# Patient Record
Sex: Male | Born: 1945 | Race: White | Hispanic: No | Marital: Married
Health system: Midwestern US, Academic
[De-identification: ages and names within clinical notes are randomized; demographics above are authoritative.]

---

## 2017-03-03 ENCOUNTER — Encounter: Admit: 2017-03-03 | Discharge: 2017-03-03 | Payer: MEDICARE

## 2017-03-03 MED ORDER — KLOR-CON M20 20 MEQ PO TBTQ
ORAL_TABLET | Freq: Two times a day (BID) | ORAL | 3 refills | 30.00000 days | Status: AC
Start: 2017-03-03 — End: 2017-12-11

## 2017-03-13 ENCOUNTER — Encounter: Admit: 2017-03-13 | Discharge: 2017-03-13 | Payer: MEDICARE

## 2017-03-13 DIAGNOSIS — I480 Paroxysmal atrial fibrillation: Principal | ICD-10-CM

## 2017-04-04 ENCOUNTER — Encounter: Admit: 2017-04-04 | Discharge: 2017-04-04 | Payer: MEDICARE

## 2017-04-04 NOTE — Telephone Encounter
Left msg at preferred # re: overdue INR. Asked pt to contact us if pt has had INR drawn recently. Advised if pt has not had INR since last check (June) pt should to get to lab within 1 week for INR. Left cardiology main # for call back prn.    ----- Message -----  From: Asencion Noble  Sent: 03/27/2017  To: [facility] [place]  Subject: INR Due 03/27/17                                   Johnny [name] is due for an INR test on 03/27/17.

## 2017-04-17 NOTE — Telephone Encounter
Called and left message requesting pt to have INR drawn at earliest convenience.  Left callback number for any questions or concerns.

## 2017-04-28 ENCOUNTER — Encounter: Admit: 2017-04-28 | Discharge: 2017-04-28 | Payer: MEDICARE

## 2017-05-03 ENCOUNTER — Encounter: Admit: 2017-05-03 | Discharge: 2017-05-03 | Payer: MEDICARE

## 2017-05-03 DIAGNOSIS — I480 Paroxysmal atrial fibrillation: Principal | ICD-10-CM

## 2017-05-03 DIAGNOSIS — Z7901 Long term (current) use of anticoagulants: ICD-10-CM

## 2017-05-03 NOTE — Progress Notes
Faxed standing INR order to [facility] [phone]

## 2017-05-16 ENCOUNTER — Encounter: Admit: 2017-05-16 | Discharge: 2017-05-16 | Payer: MEDICARE

## 2017-06-07 ENCOUNTER — Encounter: Admit: 2017-06-07 | Discharge: 2017-06-07 | Payer: MEDICARE

## 2017-06-07 NOTE — Telephone Encounter
INR overdue. Called and left message requesting pt to have INR drawn at earliest convenience.  Left callback number for any questions or concerns.

## 2017-06-15 ENCOUNTER — Ambulatory Visit: Admit: 2017-06-15 | Discharge: 2017-06-16 | Payer: MEDICARE

## 2017-06-15 DIAGNOSIS — I255 Ischemic cardiomyopathy: Principal | ICD-10-CM

## 2017-06-15 DIAGNOSIS — Z95810 Presence of automatic (implantable) cardiac defibrillator: ICD-10-CM

## 2017-06-16 ENCOUNTER — Encounter: Admit: 2017-06-16 | Discharge: 2017-06-16 | Payer: MEDICARE

## 2017-06-16 NOTE — Telephone Encounter
INR Overdue.  Called and discussed with patient who states that he will have it drawn next week.

## 2017-07-10 ENCOUNTER — Encounter: Admit: 2017-07-10 | Discharge: 2017-07-10 | Payer: MEDICARE

## 2017-07-10 DIAGNOSIS — E7800 Pure hypercholesterolemia, unspecified: Principal | ICD-10-CM

## 2017-07-10 NOTE — Telephone Encounter
-----   Message from Alfredia Client, LPN sent at 6/56/8127 11:55 AM CST -----  Regarding: Lab order  Please send order to patient to have FLP drawn per Dr. Hale Bogus. Thank you!

## 2017-07-14 ENCOUNTER — Encounter: Admit: 2017-07-14 | Discharge: 2017-07-14 | Payer: MEDICARE

## 2017-07-14 DIAGNOSIS — I480 Paroxysmal atrial fibrillation: Principal | ICD-10-CM

## 2017-07-20 ENCOUNTER — Ambulatory Visit: Admit: 2017-07-19 | Discharge: 2017-07-20 | Payer: MEDICARE

## 2017-07-20 DIAGNOSIS — I255 Ischemic cardiomyopathy: Principal | ICD-10-CM

## 2017-07-20 DIAGNOSIS — Z95810 Presence of automatic (implantable) cardiac defibrillator: ICD-10-CM

## 2017-07-21 ENCOUNTER — Encounter: Admit: 2017-07-21 | Discharge: 2017-07-21 | Payer: MEDICARE

## 2017-07-21 NOTE — Telephone Encounter
INR Overdue.  Called and discussed overdue INR with patient.  He states he will have INR checked next week.

## 2017-07-22 ENCOUNTER — Encounter: Admit: 2017-07-22 | Discharge: 2017-07-22 | Payer: MEDICARE

## 2017-07-24 ENCOUNTER — Encounter: Admit: 2017-07-24 | Discharge: 2017-07-24 | Payer: MEDICARE

## 2017-07-24 DIAGNOSIS — Z95810 Presence of automatic (implantable) cardiac defibrillator: Principal | ICD-10-CM

## 2017-07-24 MED ORDER — IRBESARTAN 150 MG PO TAB
ORAL_TABLET | Freq: Every day | 3 refills | Status: AC
Start: 2017-07-24 — End: 2017-10-17

## 2017-08-04 NOTE — Telephone Encounter
INR Overdue.  Called and left message requesting pt to have INR drawn at earliest convenience.  Left callback number for any questions or concerns.

## 2017-08-05 ENCOUNTER — Encounter: Admit: 2017-08-05 | Discharge: 2017-08-05 | Payer: MEDICARE

## 2017-08-05 DIAGNOSIS — I10 Essential (primary) hypertension: Principal | ICD-10-CM

## 2017-08-05 DIAGNOSIS — I679 Cerebrovascular disease, unspecified: ICD-10-CM

## 2017-08-07 MED ORDER — WARFARIN 4 MG PO TAB
ORAL_TABLET | Freq: Every day | ORAL | 3 refills | 90.00000 days | Status: AC
Start: 2017-08-07 — End: 2017-10-17

## 2017-08-14 ENCOUNTER — Encounter: Admit: 2017-08-14 | Discharge: 2017-08-14 | Payer: MEDICARE

## 2017-08-14 DIAGNOSIS — I480 Paroxysmal atrial fibrillation: Principal | ICD-10-CM

## 2017-08-22 ENCOUNTER — Encounter: Admit: 2017-08-22 | Discharge: 2017-08-22 | Payer: MEDICARE

## 2017-08-22 DIAGNOSIS — I480 Paroxysmal atrial fibrillation: Principal | ICD-10-CM

## 2017-09-02 ENCOUNTER — Encounter: Admit: 2017-09-02 | Discharge: 2017-09-02 | Payer: MEDICARE

## 2017-09-04 MED ORDER — WARFARIN 5 MG PO TAB
ORAL_TABLET | Freq: Every day | ORAL | 3 refills | 90.00000 days | Status: AC
Start: 2017-09-04 — End: 2017-10-17

## 2017-09-04 MED ORDER — EZETIMIBE 10 MG PO TAB
ORAL_TABLET | Freq: Every day | 3 refills | Status: AC
Start: 2017-09-04 — End: 2018-02-20

## 2017-09-13 ENCOUNTER — Encounter: Admit: 2017-09-13 | Discharge: 2017-09-13 | Payer: MEDICARE

## 2017-09-21 ENCOUNTER — Encounter: Admit: 2017-09-21 | Discharge: 2017-09-21 | Payer: MEDICARE

## 2017-09-21 DIAGNOSIS — E7800 Pure hypercholesterolemia, unspecified: Principal | ICD-10-CM

## 2017-09-21 DIAGNOSIS — I480 Paroxysmal atrial fibrillation: Principal | ICD-10-CM

## 2017-09-21 LAB — COMPREHENSIVE METABOLIC PANEL
Lab: 1
Lab: 15 — ABNORMAL HIGH (ref 0–14)
Lab: 20
Lab: 28
Lab: 3.5
Lab: 6.2
Lab: 60

## 2017-09-21 LAB — LIPID PROFILE
Lab: 24
Lab: 110 — ABNORMAL LOW (ref 150–200)
Lab: 120
Lab: 4

## 2017-09-26 ENCOUNTER — Encounter: Admit: 2017-09-26 | Discharge: 2017-09-26 | Payer: MEDICARE

## 2017-09-26 ENCOUNTER — Ambulatory Visit: Admit: 2017-09-26 | Discharge: 2017-09-27 | Payer: MEDICARE

## 2017-09-26 DIAGNOSIS — Z7189 Other specified counseling: ICD-10-CM

## 2017-09-26 DIAGNOSIS — I340 Nonrheumatic mitral (valve) insufficiency: ICD-10-CM

## 2017-09-26 DIAGNOSIS — J302 Other seasonal allergic rhinitis: ICD-10-CM

## 2017-09-26 DIAGNOSIS — I255 Ischemic cardiomyopathy: ICD-10-CM

## 2017-09-26 DIAGNOSIS — I10 Essential (primary) hypertension: Principal | ICD-10-CM

## 2017-09-26 DIAGNOSIS — I4891 Unspecified atrial fibrillation: ICD-10-CM

## 2017-09-26 DIAGNOSIS — K219 Gastro-esophageal reflux disease without esophagitis: ICD-10-CM

## 2017-09-26 DIAGNOSIS — I2510 Atherosclerotic heart disease of native coronary artery without angina pectoris: Principal | ICD-10-CM

## 2017-09-26 DIAGNOSIS — E785 Hyperlipidemia, unspecified: ICD-10-CM

## 2017-09-26 DIAGNOSIS — Z95810 Presence of automatic (implantable) cardiac defibrillator: ICD-10-CM

## 2017-09-26 DIAGNOSIS — G4733 Obstructive sleep apnea (adult) (pediatric): ICD-10-CM

## 2017-09-26 DIAGNOSIS — K449 Diaphragmatic hernia without obstruction or gangrene: ICD-10-CM

## 2017-09-26 DIAGNOSIS — I480 Paroxysmal atrial fibrillation: ICD-10-CM

## 2017-09-26 DIAGNOSIS — G5600 Carpal tunnel syndrome, unspecified upper limb: ICD-10-CM

## 2017-09-26 DIAGNOSIS — T148XXA Other injury of unspecified body region, initial encounter: ICD-10-CM

## 2017-09-26 DIAGNOSIS — M1990 Unspecified osteoarthritis, unspecified site: ICD-10-CM

## 2017-09-26 DIAGNOSIS — M549 Dorsalgia, unspecified: ICD-10-CM

## 2017-09-26 DIAGNOSIS — R05 Cough: ICD-10-CM

## 2017-09-26 DIAGNOSIS — E669 Obesity, unspecified: ICD-10-CM

## 2017-09-26 LAB — BASIC METABOLIC PANEL
Lab: 1.6 — ABNORMAL HIGH (ref 0.72–1.25)
Lab: 101
Lab: 104
Lab: 136
Lab: 16 — ABNORMAL HIGH (ref 0–14)
Lab: 22 — ABNORMAL LOW (ref 23–31)
Lab: 47 — ABNORMAL HIGH (ref 8.4–25.7)
Lab: 5.5 — ABNORMAL HIGH (ref 3.5–5.1)
Lab: 9.4

## 2017-09-27 ENCOUNTER — Encounter: Admit: 2017-09-27 | Discharge: 2017-09-27 | Payer: MEDICARE

## 2017-09-27 DIAGNOSIS — I10 Essential (primary) hypertension: Principal | ICD-10-CM

## 2017-09-27 DIAGNOSIS — I255 Ischemic cardiomyopathy: ICD-10-CM

## 2017-10-16 ENCOUNTER — Encounter: Admit: 2017-10-16 | Discharge: 2017-10-16 | Payer: MEDICARE

## 2017-10-17 ENCOUNTER — Encounter: Admit: 2017-10-17 | Discharge: 2017-10-17 | Payer: MEDICARE

## 2017-10-17 DIAGNOSIS — E875 Hyperkalemia: Principal | ICD-10-CM

## 2017-10-17 DIAGNOSIS — I10 Essential (primary) hypertension: Principal | ICD-10-CM

## 2017-10-17 DIAGNOSIS — I679 Cerebrovascular disease, unspecified: ICD-10-CM

## 2017-10-17 MED ORDER — METOPROLOL SUCCINATE 100 MG PO TB24
100 mg | ORAL_TABLET | Freq: Every day | ORAL | 3 refills | 90.00000 days | Status: AC
Start: 2017-10-17 — End: 2018-11-27

## 2017-10-17 MED ORDER — SPIRONOLACTONE 25 MG PO TAB
25 mg | ORAL_TABLET | Freq: Two times a day (BID) | ORAL | 3 refills | 90.00000 days | Status: AC
Start: 2017-10-17 — End: 2018-12-26

## 2017-10-17 MED ORDER — WARFARIN 4 MG PO TAB
4 mg | ORAL_TABLET | Freq: Every day | ORAL | 3 refills | 90.00000 days | Status: AC
Start: 2017-10-17 — End: 2018-09-26

## 2017-10-17 MED ORDER — IRBESARTAN 150 MG PO TAB
150 mg | ORAL_TABLET | Freq: Every evening | ORAL | 3 refills | Status: AC
Start: 2017-10-17 — End: 2017-10-23

## 2017-10-17 MED ORDER — ATORVASTATIN 80 MG PO TAB
80 mg | ORAL_TABLET | Freq: Every day | ORAL | 3 refills | Status: AC
Start: 2017-10-17 — End: 2018-06-26

## 2017-10-17 MED ORDER — WARFARIN 5 MG PO TAB
ORAL_TABLET | Freq: Every day | ORAL | 3 refills | 90.00000 days | Status: AC
Start: 2017-10-17 — End: 2018-10-02

## 2017-10-17 MED ORDER — FUROSEMIDE 40 MG PO TAB
ORAL_TABLET | Freq: Every day | ORAL | 3 refills | 90.00000 days | Status: AC
Start: 2017-10-17 — End: 2018-09-04

## 2017-10-18 ENCOUNTER — Encounter: Admit: 2017-10-18 | Discharge: 2017-10-18 | Payer: MEDICARE

## 2017-10-18 ENCOUNTER — Ambulatory Visit: Admit: 2017-10-18 | Discharge: 2017-10-19 | Payer: MEDICARE

## 2017-10-18 DIAGNOSIS — Z95810 Presence of automatic (implantable) cardiac defibrillator: Principal | ICD-10-CM

## 2017-10-19 ENCOUNTER — Encounter: Admit: 2017-10-19 | Discharge: 2017-10-19 | Payer: MEDICARE

## 2017-10-19 DIAGNOSIS — Z95810 Presence of automatic (implantable) cardiac defibrillator: ICD-10-CM

## 2017-10-19 DIAGNOSIS — I255 Ischemic cardiomyopathy: Principal | ICD-10-CM

## 2017-10-19 MED ORDER — LOSARTAN 50 MG PO TAB
50 mg | ORAL_TABLET | Freq: Every day | ORAL | 3 refills | 30.00000 days | Status: AC
Start: 2017-10-19 — End: 2018-09-04

## 2017-10-20 ENCOUNTER — Encounter: Admit: 2017-10-20 | Discharge: 2017-10-20 | Payer: MEDICARE

## 2017-10-20 DIAGNOSIS — E875 Hyperkalemia: Principal | ICD-10-CM

## 2017-10-20 LAB — BASIC METABOLIC PANEL
Lab: 137
Lab: 140 — ABNORMAL HIGH (ref 83–110)
Lab: 22 — ABNORMAL LOW (ref 23–31)
Lab: 4.8
Lab: 9.5
Lab: 1.5 — ABNORMAL HIGH (ref 0.72–1.25)
Lab: 103
Lab: 17 — ABNORMAL HIGH (ref 0–14)
Lab: 39 — ABNORMAL HIGH (ref 8.4–25.7)
Lab: 49 — ABNORMAL LOW (ref >59)

## 2017-10-31 ENCOUNTER — Encounter: Admit: 2017-10-31 | Discharge: 2017-10-31 | Payer: MEDICARE

## 2017-12-01 ENCOUNTER — Encounter: Admit: 2017-12-01 | Discharge: 2017-12-01 | Payer: MEDICARE

## 2017-12-11 ENCOUNTER — Encounter: Admit: 2017-12-11 | Discharge: 2017-12-11 | Payer: MEDICARE

## 2017-12-11 MED ORDER — POTASSIUM CHLORIDE 20 MEQ PO TBTQ
40 meq | ORAL_TABLET | Freq: Two times a day (BID) | ORAL | 3 refills | 30.00000 days | Status: AC
Start: 2017-12-11 — End: 2018-06-18

## 2017-12-21 ENCOUNTER — Encounter: Admit: 2017-12-21 | Discharge: 2017-12-21 | Payer: MEDICARE

## 2017-12-21 DIAGNOSIS — I340 Nonrheumatic mitral (valve) insufficiency: ICD-10-CM

## 2017-12-21 DIAGNOSIS — M549 Dorsalgia, unspecified: ICD-10-CM

## 2017-12-21 DIAGNOSIS — Z95810 Presence of automatic (implantable) cardiac defibrillator: Secondary | ICD-10-CM

## 2017-12-21 DIAGNOSIS — E669 Obesity, unspecified: ICD-10-CM

## 2017-12-21 DIAGNOSIS — R05 Cough: ICD-10-CM

## 2017-12-21 DIAGNOSIS — M1990 Unspecified osteoarthritis, unspecified site: ICD-10-CM

## 2017-12-21 DIAGNOSIS — G5600 Carpal tunnel syndrome, unspecified upper limb: ICD-10-CM

## 2017-12-21 DIAGNOSIS — I480 Paroxysmal atrial fibrillation: Principal | ICD-10-CM

## 2017-12-21 DIAGNOSIS — K449 Diaphragmatic hernia without obstruction or gangrene: ICD-10-CM

## 2017-12-21 DIAGNOSIS — J302 Other seasonal allergic rhinitis: ICD-10-CM

## 2017-12-21 DIAGNOSIS — I255 Ischemic cardiomyopathy: ICD-10-CM

## 2017-12-21 DIAGNOSIS — K219 Gastro-esophageal reflux disease without esophagitis: ICD-10-CM

## 2017-12-21 DIAGNOSIS — I4891 Unspecified atrial fibrillation: ICD-10-CM

## 2017-12-21 DIAGNOSIS — I2510 Atherosclerotic heart disease of native coronary artery without angina pectoris: Principal | ICD-10-CM

## 2017-12-21 DIAGNOSIS — Z7901 Long term (current) use of anticoagulants: ICD-10-CM

## 2017-12-21 DIAGNOSIS — E785 Hyperlipidemia, unspecified: ICD-10-CM

## 2017-12-21 DIAGNOSIS — G4733 Obstructive sleep apnea (adult) (pediatric): ICD-10-CM

## 2017-12-21 DIAGNOSIS — I10 Essential (primary) hypertension: ICD-10-CM

## 2017-12-21 DIAGNOSIS — T148XXA Other injury of unspecified body region, initial encounter: ICD-10-CM

## 2017-12-21 LAB — PROTIME INR (PT): Lab: 2.8

## 2017-12-25 ENCOUNTER — Encounter: Admit: 2017-12-25 | Discharge: 2017-12-25 | Payer: MEDICARE

## 2018-01-18 ENCOUNTER — Ambulatory Visit: Admit: 2018-01-19 | Discharge: 2018-01-19 | Payer: MEDICARE

## 2018-01-19 DIAGNOSIS — Z95810 Presence of automatic (implantable) cardiac defibrillator: ICD-10-CM

## 2018-01-19 DIAGNOSIS — I255 Ischemic cardiomyopathy: Principal | ICD-10-CM

## 2018-01-23 ENCOUNTER — Encounter: Admit: 2018-01-23 | Discharge: 2018-01-23 | Payer: MEDICARE

## 2018-01-23 DIAGNOSIS — Z7901 Long term (current) use of anticoagulants: ICD-10-CM

## 2018-01-23 DIAGNOSIS — I480 Paroxysmal atrial fibrillation: Principal | ICD-10-CM

## 2018-01-23 LAB — PROTIME INR (PT): Lab: 2.1

## 2018-02-08 ENCOUNTER — Encounter: Admit: 2018-02-08 | Discharge: 2018-02-08 | Payer: MEDICARE

## 2018-02-20 ENCOUNTER — Encounter: Admit: 2018-02-20 | Discharge: 2018-02-20 | Payer: MEDICARE

## 2018-02-20 MED ORDER — EZETIMIBE 10 MG PO TAB
10 mg | ORAL_TABLET | Freq: Every evening | ORAL | 0 refills | Status: AC
Start: 2018-02-20 — End: 2018-02-20

## 2018-02-20 MED ORDER — EZETIMIBE 10 MG PO TAB
10 mg | ORAL_TABLET | Freq: Every evening | ORAL | 3 refills | Status: AC
Start: 2018-02-20 — End: 2019-05-08

## 2018-02-27 ENCOUNTER — Encounter: Admit: 2018-02-27 | Discharge: 2018-02-27 | Payer: MEDICARE

## 2018-02-27 DIAGNOSIS — I480 Paroxysmal atrial fibrillation: Principal | ICD-10-CM

## 2018-02-28 ENCOUNTER — Ambulatory Visit: Admit: 2018-02-28 | Discharge: 2018-02-28 | Payer: MEDICARE

## 2018-02-28 ENCOUNTER — Encounter: Admit: 2018-02-28 | Discharge: 2018-02-28 | Payer: MEDICARE

## 2018-02-28 ENCOUNTER — Ambulatory Visit: Admit: 2018-02-28 | Discharge: 2018-03-01 | Payer: MEDICARE

## 2018-02-28 DIAGNOSIS — I472 Ventricular tachycardia: ICD-10-CM

## 2018-02-28 DIAGNOSIS — Z7901 Long term (current) use of anticoagulants: ICD-10-CM

## 2018-02-28 DIAGNOSIS — T82190A Other mechanical complication of cardiac electrode, initial encounter: ICD-10-CM

## 2018-02-28 DIAGNOSIS — K449 Diaphragmatic hernia without obstruction or gangrene: ICD-10-CM

## 2018-02-28 DIAGNOSIS — M549 Dorsalgia, unspecified: ICD-10-CM

## 2018-02-28 DIAGNOSIS — I2510 Atherosclerotic heart disease of native coronary artery without angina pectoris: Principal | ICD-10-CM

## 2018-02-28 DIAGNOSIS — T148XXA Other injury of unspecified body region, initial encounter: ICD-10-CM

## 2018-02-28 DIAGNOSIS — E785 Hyperlipidemia, unspecified: ICD-10-CM

## 2018-02-28 DIAGNOSIS — I517 Cardiomegaly: ICD-10-CM

## 2018-02-28 DIAGNOSIS — I480 Paroxysmal atrial fibrillation: ICD-10-CM

## 2018-02-28 DIAGNOSIS — Z95810 Presence of automatic (implantable) cardiac defibrillator: ICD-10-CM

## 2018-02-28 DIAGNOSIS — R05 Cough: ICD-10-CM

## 2018-02-28 DIAGNOSIS — I255 Ischemic cardiomyopathy: Principal | ICD-10-CM

## 2018-02-28 DIAGNOSIS — I340 Nonrheumatic mitral (valve) insufficiency: ICD-10-CM

## 2018-02-28 DIAGNOSIS — I10 Essential (primary) hypertension: ICD-10-CM

## 2018-02-28 DIAGNOSIS — J302 Other seasonal allergic rhinitis: ICD-10-CM

## 2018-02-28 DIAGNOSIS — R918 Other nonspecific abnormal finding of lung field: ICD-10-CM

## 2018-02-28 DIAGNOSIS — M1990 Unspecified osteoarthritis, unspecified site: ICD-10-CM

## 2018-02-28 DIAGNOSIS — K219 Gastro-esophageal reflux disease without esophagitis: ICD-10-CM

## 2018-02-28 DIAGNOSIS — E669 Obesity, unspecified: ICD-10-CM

## 2018-02-28 DIAGNOSIS — G4733 Obstructive sleep apnea (adult) (pediatric): ICD-10-CM

## 2018-02-28 DIAGNOSIS — I4891 Unspecified atrial fibrillation: ICD-10-CM

## 2018-02-28 DIAGNOSIS — G5600 Carpal tunnel syndrome, unspecified upper limb: ICD-10-CM

## 2018-03-01 ENCOUNTER — Encounter: Admit: 2018-03-01 | Discharge: 2018-03-01 | Payer: MEDICARE

## 2018-03-01 DIAGNOSIS — I255 Ischemic cardiomyopathy: ICD-10-CM

## 2018-03-01 DIAGNOSIS — I472 Ventricular tachycardia: Principal | ICD-10-CM

## 2018-03-01 DIAGNOSIS — I480 Paroxysmal atrial fibrillation: Principal | ICD-10-CM

## 2018-03-01 DIAGNOSIS — T82190A Other mechanical complication of cardiac electrode, initial encounter: Principal | ICD-10-CM

## 2018-03-01 MED ORDER — SODIUM CHLORIDE 0.9 % IV SOLP
INTRAVENOUS | 0 refills | Status: CN
Start: 2018-03-01 — End: ?

## 2018-03-01 MED ORDER — CEFAZOLIN INJ 1GM IVP
1 g | INTRAVENOUS | 0 refills | Status: CN
Start: 2018-03-01 — End: ?

## 2018-03-01 MED ORDER — CEFAZOLIN INJ 1GM IVP
2 g | Freq: Once | INTRAVENOUS | 0 refills | Status: CN
Start: 2018-03-01 — End: ?

## 2018-03-01 MED ORDER — LIDOCAINE (PF) 10 MG/ML (1 %) IJ SOLN
.1-2 mL | INTRAMUSCULAR | 0 refills | Status: CN | PRN
Start: 2018-03-01 — End: ?

## 2018-03-02 ENCOUNTER — Encounter: Admit: 2018-03-02 | Discharge: 2018-03-02 | Payer: MEDICARE

## 2018-03-12 ENCOUNTER — Encounter: Admit: 2018-03-12 | Discharge: 2018-03-12 | Payer: MEDICARE

## 2018-03-12 DIAGNOSIS — I480 Paroxysmal atrial fibrillation: Principal | ICD-10-CM

## 2018-03-12 LAB — BASIC METABOLIC PANEL
Lab: 106
Lab: 13
Lab: 8.4

## 2018-03-12 LAB — PROTIME INR (PT): Lab: 1.2 % — ABNORMAL LOW (ref >60)

## 2018-03-13 ENCOUNTER — Encounter: Admit: 2018-03-13 | Discharge: 2018-03-13 | Payer: MEDICARE

## 2018-03-13 ENCOUNTER — Encounter: Admit: 2018-03-13 | Discharge: 2018-03-14 | Payer: MEDICARE

## 2018-03-13 ENCOUNTER — Ambulatory Visit: Admit: 2018-03-13 | Discharge: 2018-03-13 | Payer: MEDICARE

## 2018-03-13 DIAGNOSIS — I10 Essential (primary) hypertension: ICD-10-CM

## 2018-03-13 DIAGNOSIS — I4891 Unspecified atrial fibrillation: ICD-10-CM

## 2018-03-13 DIAGNOSIS — J302 Other seasonal allergic rhinitis: ICD-10-CM

## 2018-03-13 DIAGNOSIS — I472 Ventricular tachycardia: Principal | ICD-10-CM

## 2018-03-13 DIAGNOSIS — R05 Cough: ICD-10-CM

## 2018-03-13 DIAGNOSIS — I255 Ischemic cardiomyopathy: ICD-10-CM

## 2018-03-13 DIAGNOSIS — E669 Obesity, unspecified: ICD-10-CM

## 2018-03-13 DIAGNOSIS — Z95810 Presence of automatic (implantable) cardiac defibrillator: ICD-10-CM

## 2018-03-13 DIAGNOSIS — G5600 Carpal tunnel syndrome, unspecified upper limb: ICD-10-CM

## 2018-03-13 DIAGNOSIS — M549 Dorsalgia, unspecified: ICD-10-CM

## 2018-03-13 DIAGNOSIS — M1990 Unspecified osteoarthritis, unspecified site: ICD-10-CM

## 2018-03-13 DIAGNOSIS — I340 Nonrheumatic mitral (valve) insufficiency: ICD-10-CM

## 2018-03-13 DIAGNOSIS — K449 Diaphragmatic hernia without obstruction or gangrene: ICD-10-CM

## 2018-03-13 DIAGNOSIS — I2510 Atherosclerotic heart disease of native coronary artery without angina pectoris: Principal | ICD-10-CM

## 2018-03-13 DIAGNOSIS — E785 Hyperlipidemia, unspecified: ICD-10-CM

## 2018-03-13 DIAGNOSIS — T148XXA Other injury of unspecified body region, initial encounter: ICD-10-CM

## 2018-03-13 DIAGNOSIS — K219 Gastro-esophageal reflux disease without esophagitis: ICD-10-CM

## 2018-03-13 DIAGNOSIS — G4733 Obstructive sleep apnea (adult) (pediatric): ICD-10-CM

## 2018-03-13 LAB — COMPREHENSIVE METABOLIC PANEL
Lab: 106 MMOL/L — ABNORMAL HIGH (ref 98–110)
Lab: 107 mg/dL — ABNORMAL HIGH (ref 70–100)
Lab: 139 MMOL/L — ABNORMAL HIGH (ref 137–147)
Lab: 24 mg/dL (ref 7–25)
Lab: 4.4 MMOL/L — ABNORMAL LOW (ref 3.5–5.1)

## 2018-03-13 LAB — POC PT/INR: Lab: 1.1 (ref 0.8–1.2)

## 2018-03-13 LAB — CBC: Lab: 8.2 10*3/uL (ref 4.5–11.0)

## 2018-03-13 LAB — MAGNESIUM: Lab: 2 mg/dL (ref 1.6–2.6)

## 2018-03-13 MED ORDER — POTASSIUM CHLORIDE 20 MEQ PO TBTQ
40-60 meq | ORAL | 0 refills | Status: DC | PRN
Start: 2018-03-13 — End: 2018-03-14

## 2018-03-13 MED ORDER — FAMOTIDINE 20 MG PO TAB
20 mg | Freq: Every day | ORAL | 0 refills | Status: DC
Start: 2018-03-13 — End: 2018-03-14
  Administered 2018-03-14: 14:00:00 20 mg via ORAL

## 2018-03-13 MED ORDER — ACETAMINOPHEN 325 MG PO TAB
650 mg | ORAL | 0 refills | Status: DC | PRN
Start: 2018-03-13 — End: 2018-03-14
  Administered 2018-03-14: 15:00:00 650 mg via ORAL

## 2018-03-13 MED ORDER — ATORVASTATIN 40 MG PO TAB
80 mg | Freq: Every evening | ORAL | 0 refills | Status: DC
Start: 2018-03-13 — End: 2018-03-14
  Administered 2018-03-14: 04:00:00 80 mg via ORAL

## 2018-03-13 MED ORDER — FENTANYL CITRATE (PF) 50 MCG/ML IJ SOLN
50 ug | Freq: Once | INTRAVENOUS | 0 refills | Status: AC | PRN
Start: 2018-03-13 — End: ?

## 2018-03-13 MED ORDER — DEXTRAN 70-HYPROMELLOSE (PF) 0.1-0.3 % OP DPET
0 refills | Status: DC
Start: 2018-03-13 — End: 2018-03-13
  Administered 2018-03-13: 19:00:00 2 [drp] via OPHTHALMIC

## 2018-03-13 MED ORDER — DEXAMETHASONE SODIUM PHOSPHATE 4 MG/ML IJ SOLN
INTRAVENOUS | 0 refills | Status: DC
Start: 2018-03-13 — End: 2018-03-13
  Administered 2018-03-13: 19:00:00 4 mg via INTRAVENOUS

## 2018-03-13 MED ORDER — ONDANSETRON HCL (PF) 4 MG/2 ML IJ SOLN
INTRAVENOUS | 0 refills | Status: DC
Start: 2018-03-13 — End: 2018-03-13
  Administered 2018-03-13: 22:00:00 4 mg via INTRAVENOUS

## 2018-03-13 MED ORDER — POTASSIUM CHLORIDE 20 MEQ/15 ML PO LIQD
40-60 meq | NASOGASTRIC | 0 refills | Status: DC | PRN
Start: 2018-03-13 — End: 2018-03-14

## 2018-03-13 MED ORDER — ASPIRIN 81 MG PO CHEW
81 mg | Freq: Every day | ORAL | 0 refills | Status: DC
Start: 2018-03-13 — End: 2018-03-14
  Administered 2018-03-14: 14:00:00 81 mg via ORAL

## 2018-03-13 MED ORDER — DOCUSATE SODIUM 100 MG PO CAP
100 mg | Freq: Every day | ORAL | 0 refills | Status: DC
Start: 2018-03-13 — End: 2018-03-14
  Administered 2018-03-14: 14:00:00 100 mg via ORAL

## 2018-03-13 MED ORDER — WARFARIN 4 MG PO TAB
4 mg | Freq: Every evening | ORAL | 0 refills | Status: DC
Start: 2018-03-13 — End: 2018-03-14
  Administered 2018-03-14: 04:00:00 4 mg via ORAL

## 2018-03-13 MED ORDER — SUGAMMADEX 100 MG/ML IV SOLN
INTRAVENOUS | 0 refills | Status: DC
Start: 2018-03-13 — End: 2018-03-13
  Administered 2018-03-13: 22:00:00 197 mg via INTRAVENOUS

## 2018-03-13 MED ORDER — CETIRIZINE 10 MG PO TAB
10 mg | Freq: Every day | ORAL | 0 refills | Status: DC
Start: 2018-03-13 — End: 2018-03-14
  Administered 2018-03-14: 14:00:00 10 mg via ORAL

## 2018-03-13 MED ORDER — OXYCODONE 5 MG PO TAB
5-10 mg | Freq: Once | ORAL | 0 refills | Status: DC | PRN
Start: 2018-03-13 — End: 2018-03-14

## 2018-03-13 MED ORDER — MONTELUKAST 10 MG PO TAB
10 mg | Freq: Every evening | ORAL | 0 refills | Status: DC
Start: 2018-03-13 — End: 2018-03-14
  Administered 2018-03-14: 04:00:00 10 mg via ORAL

## 2018-03-13 MED ORDER — LIDOCAINE (PF) 10 MG/ML (1 %) IJ SOLN
.1-2 mL | INTRAMUSCULAR | 0 refills | Status: DC | PRN
Start: 2018-03-13 — End: 2018-03-14

## 2018-03-13 MED ORDER — EZETIMIBE 10 MG PO TAB
10 mg | Freq: Every evening | ORAL | 0 refills | Status: DC
Start: 2018-03-13 — End: 2018-03-14
  Administered 2018-03-14: 04:00:00 10 mg via ORAL

## 2018-03-13 MED ORDER — METOPROLOL SUCCINATE 100 MG PO TB24
100 mg | Freq: Every day | ORAL | 0 refills | Status: DC
Start: 2018-03-13 — End: 2018-03-14
  Administered 2018-03-14: 14:00:00 100 mg via ORAL

## 2018-03-13 MED ORDER — LIDOCAINE (PF) 200 MG/10 ML (2 %) IJ SYRG
0 refills | Status: DC
Start: 2018-03-13 — End: 2018-03-13
  Administered 2018-03-13: 19:00:00 100 mg via INTRAVENOUS

## 2018-03-13 MED ORDER — HALOPERIDOL LACTATE 5 MG/ML IJ SOLN
1 mg | Freq: Once | INTRAVENOUS | 0 refills | Status: DC | PRN
Start: 2018-03-13 — End: 2018-03-14

## 2018-03-13 MED ORDER — TEMAZEPAM 15 MG PO CAP
15 mg | Freq: Every evening | ORAL | 0 refills | Status: DC | PRN
Start: 2018-03-13 — End: 2018-03-14

## 2018-03-13 MED ORDER — CEFAZOLIN INJ 1GM IVP
2 g | Freq: Once | INTRAVENOUS | 0 refills | Status: CP
Start: 2018-03-13 — End: ?
  Administered 2018-03-13: 19:00:00 2 g via INTRAVENOUS

## 2018-03-13 MED ORDER — PROMETHAZINE 25 MG/ML IJ SOLN
6.25 mg | INTRAVENOUS | 0 refills | Status: DC | PRN
Start: 2018-03-13 — End: 2018-03-14

## 2018-03-13 MED ORDER — ALUMINUM-MAGNESIUM HYDROXIDE 200-200 MG/5 ML PO SUSP
30 mL | ORAL | 0 refills | Status: DC | PRN
Start: 2018-03-13 — End: 2018-03-14

## 2018-03-13 MED ORDER — FENTANYL CITRATE (PF) 50 MCG/ML IJ SOLN
0 refills | Status: DC
Start: 2018-03-13 — End: 2018-03-13

## 2018-03-13 MED ORDER — ROCURONIUM 10 MG/ML IV SOLN
INTRAVENOUS | 0 refills | Status: DC
Start: 2018-03-13 — End: 2018-03-13
  Administered 2018-03-13: 19:00:00 50 mg via INTRAVENOUS

## 2018-03-13 MED ORDER — LOSARTAN 50 MG PO TAB
50 mg | Freq: Every day | ORAL | 0 refills | Status: DC
Start: 2018-03-13 — End: 2018-03-14
  Administered 2018-03-14: 14:00:00 50 mg via ORAL

## 2018-03-13 MED ORDER — GABAPENTIN 300 MG PO CAP
300 mg | Freq: Two times a day (BID) | ORAL | 0 refills | Status: DC
Start: 2018-03-13 — End: 2018-03-14
  Administered 2018-03-14 (×2): 300 mg via ORAL

## 2018-03-13 MED ORDER — SPIRONOLACTONE 25 MG PO TAB
25 mg | Freq: Two times a day (BID) | ORAL | 0 refills | Status: DC
Start: 2018-03-13 — End: 2018-03-14
  Administered 2018-03-14: 14:00:00 25 mg via ORAL

## 2018-03-13 MED ORDER — PANTOPRAZOLE 40 MG PO TBEC
40 mg | Freq: Two times a day (BID) | ORAL | 0 refills | Status: DC
Start: 2018-03-13 — End: 2018-03-14
  Administered 2018-03-14 (×2): 40 mg via ORAL

## 2018-03-13 MED ORDER — DIPHENHYDRAMINE HCL 50 MG/ML IJ SOLN
25 mg | Freq: Once | INTRAVENOUS | 0 refills | Status: DC | PRN
Start: 2018-03-13 — End: 2018-03-14

## 2018-03-13 MED ORDER — PROPOFOL INJ 10 MG/ML IV VIAL
0 refills | Status: DC
Start: 2018-03-13 — End: 2018-03-13
  Administered 2018-03-13: 21:00:00 50 mg via INTRAVENOUS
  Administered 2018-03-13: 19:00:00 100 mg via INTRAVENOUS

## 2018-03-13 MED ORDER — LORATADINE 10 MG PO TAB
10 mg | Freq: Every day | ORAL | 0 refills | Status: DC
Start: 2018-03-13 — End: 2018-03-14
  Administered 2018-03-14: 14:00:00 10 mg via ORAL

## 2018-03-13 MED ORDER — CEFAZOLIN INJ 1GM IVP
1 g | INTRAVENOUS | 0 refills | Status: CP
Start: 2018-03-13 — End: ?
  Administered 2018-03-14: 04:00:00 1 g via INTRAVENOUS

## 2018-03-13 MED ORDER — ALBUTEROL SULFATE 90 MCG/ACTUATION IN HFAA
2 | Freq: Four times a day (QID) | RESPIRATORY_TRACT | 0 refills | Status: DC | PRN
Start: 2018-03-13 — End: 2018-03-14

## 2018-03-13 MED ORDER — SODIUM CHLORIDE 0.9 % IV SOLP
INTRAVENOUS | 0 refills | Status: DC
Start: 2018-03-13 — End: 2018-03-14
  Administered 2018-03-13: 17:00:00 1000 mL via INTRAVENOUS

## 2018-03-13 MED ORDER — BENZONATATE 100 MG PO CAP
100 mg | ORAL | 0 refills | Status: DC | PRN
Start: 2018-03-13 — End: 2018-03-14

## 2018-03-13 MED ORDER — PHENYLEPHRINE IV DRIP (STD CONC)
0 refills | Status: DC
Start: 2018-03-13 — End: 2018-03-13
  Administered 2018-03-13 (×2): .3 ug/kg/min via INTRAVENOUS

## 2018-03-13 MED ORDER — FENTANYL CITRATE (PF) 50 MCG/ML IJ SOLN
25 ug | INTRAVENOUS | 0 refills | Status: DC | PRN
Start: 2018-03-13 — End: 2018-03-14

## 2018-03-13 MED ORDER — DOCUSATE SODIUM 100 MG PO CAP
100 mg | Freq: Every day | ORAL | 0 refills | Status: DC | PRN
Start: 2018-03-13 — End: 2018-03-14

## 2018-03-13 MED ORDER — MAGNESIUM SULFATE IN D5W 1 GRAM/100 ML IV PGBK
1 g | INTRAVENOUS | 0 refills | Status: DC | PRN
Start: 2018-03-13 — End: 2018-03-14

## 2018-03-13 MED ORDER — ELECTROLYTE-A IV SOLP
0 refills | Status: DC
Start: 2018-03-13 — End: 2018-03-13
  Administered 2018-03-13: 19:00:00 via INTRAVENOUS

## 2018-03-13 MED ORDER — IPRATROPIUM BROMIDE 42 MCG (0.06 %) NA SPRY
2 | Freq: Two times a day (BID) | NASAL | 0 refills | Status: DC
Start: 2018-03-13 — End: 2018-03-14
  Administered 2018-03-14: 15:00:00 2 via NASAL

## 2018-03-14 ENCOUNTER — Encounter: Admit: 2018-03-14 | Discharge: 2018-03-14 | Payer: MEDICARE

## 2018-03-14 ENCOUNTER — Encounter: Admit: 2018-03-14 | Discharge: 2018-03-15 | Payer: MEDICARE

## 2018-03-14 DIAGNOSIS — I5022 Chronic systolic (congestive) heart failure: ICD-10-CM

## 2018-03-14 DIAGNOSIS — I472 Ventricular tachycardia: Principal | ICD-10-CM

## 2018-03-14 DIAGNOSIS — I255 Ischemic cardiomyopathy: ICD-10-CM

## 2018-03-14 DIAGNOSIS — T82110A Breakdown (mechanical) of cardiac electrode, initial encounter: ICD-10-CM

## 2018-03-14 LAB — BASIC METABOLIC PANEL
Lab: 1 mg/dL (ref 0.4–1.24)
Lab: 139 MMOL/L (ref 137–147)
Lab: 177 mg/dL — ABNORMAL HIGH (ref 70–100)
Lab: 21 mg/dL — ABNORMAL HIGH (ref 7–25)
Lab: 24 MMOL/L (ref 21–30)
Lab: 4.5 MMOL/L (ref 3.5–5.1)
Lab: 8 pg (ref 3–12)
Lab: 8.4 mg/dL — ABNORMAL LOW (ref 8.5–10.6)
Lab: >60 mL/min (ref >60)
Lab: >60 mL/min (ref >60)

## 2018-03-14 LAB — PROTIME INR (PT): Lab: 1.2 MMOL/L (ref 0.8–1.2)

## 2018-03-14 LAB — CBC: Lab: 11 10*3/uL — ABNORMAL HIGH (ref 4.5–11.0)

## 2018-03-14 MED ORDER — CEPHALEXIN 500 MG PO CAP
500 mg | ORAL_CAPSULE | Freq: Three times a day (TID) | ORAL | 0 refills | Status: AC
Start: 2018-03-14 — End: ?

## 2018-03-16 ENCOUNTER — Encounter: Admit: 2018-03-16 | Discharge: 2018-03-16 | Payer: MEDICARE

## 2018-03-16 DIAGNOSIS — T82190A Other mechanical complication of cardiac electrode, initial encounter: Principal | ICD-10-CM

## 2018-03-16 DIAGNOSIS — I480 Paroxysmal atrial fibrillation: Principal | ICD-10-CM

## 2018-03-16 LAB — PROTIME INR (PT): Lab: 1.3 U/L — ABNORMAL HIGH (ref 16–43)

## 2018-03-17 ENCOUNTER — Ambulatory Visit: Admit: 2018-03-17 | Discharge: 2018-03-17 | Payer: MEDICARE

## 2018-03-17 DIAGNOSIS — Z95810 Presence of automatic (implantable) cardiac defibrillator: Principal | ICD-10-CM

## 2018-03-20 ENCOUNTER — Encounter: Admit: 2018-03-20 | Discharge: 2018-03-20 | Payer: MEDICARE

## 2018-03-20 DIAGNOSIS — I480 Paroxysmal atrial fibrillation: Principal | ICD-10-CM

## 2018-03-20 DIAGNOSIS — Z7901 Long term (current) use of anticoagulants: ICD-10-CM

## 2018-03-20 LAB — PROTIME INR (PT): Lab: 1.7 mL/min (ref >60)

## 2018-04-03 ENCOUNTER — Encounter: Admit: 2018-04-03 | Discharge: 2018-04-03 | Payer: MEDICARE

## 2018-04-24 ENCOUNTER — Encounter: Admit: 2018-04-24 | Discharge: 2018-04-24 | Payer: MEDICARE

## 2018-04-24 DIAGNOSIS — I480 Paroxysmal atrial fibrillation: Principal | ICD-10-CM

## 2018-04-24 DIAGNOSIS — Z7901 Long term (current) use of anticoagulants: ICD-10-CM

## 2018-04-24 LAB — PROTIME INR (PT): Lab: 2.5

## 2018-04-27 ENCOUNTER — Encounter: Admit: 2018-04-27 | Discharge: 2018-04-27 | Payer: MEDICARE

## 2018-04-27 DIAGNOSIS — I480 Paroxysmal atrial fibrillation: Principal | ICD-10-CM

## 2018-05-29 ENCOUNTER — Encounter: Admit: 2018-05-29 | Discharge: 2018-05-29 | Payer: MEDICARE

## 2018-06-05 ENCOUNTER — Encounter: Admit: 2018-06-05 | Discharge: 2018-06-05 | Payer: MEDICARE

## 2018-06-05 LAB — PROTIME INR (PT): Lab: 2.5 mL/min (ref >60)

## 2018-06-06 ENCOUNTER — Encounter: Admit: 2018-06-06 | Discharge: 2018-06-06 | Payer: MEDICARE

## 2018-06-06 DIAGNOSIS — I480 Paroxysmal atrial fibrillation: Principal | ICD-10-CM

## 2018-06-12 ENCOUNTER — Encounter: Admit: 2018-06-12 | Discharge: 2018-06-12 | Payer: MEDICARE

## 2018-06-12 LAB — CBC
Lab: 12 — ABNORMAL HIGH (ref 4.0–10.5)
Lab: 16 — ABNORMAL HIGH (ref 11.5–14.0)
Lab: 204
Lab: 30
Lab: 49 — ABNORMAL HIGH (ref 0.5–1.3)
Lab: 5.2
Lab: 8.3

## 2018-06-12 LAB — COMPREHENSIVE METABOLIC PANEL
Lab: 134
Lab: 19
Lab: 21
Lab: 37
Lab: 8

## 2018-06-15 ENCOUNTER — Ambulatory Visit: Admit: 2018-06-15 | Discharge: 2018-06-16 | Payer: MEDICARE

## 2018-06-15 ENCOUNTER — Encounter: Admit: 2018-06-15 | Discharge: 2018-06-15 | Payer: MEDICARE

## 2018-06-15 ENCOUNTER — Ambulatory Visit: Admit: 2018-06-15 | Discharge: 2018-06-15 | Payer: MEDICARE

## 2018-06-15 DIAGNOSIS — I255 Ischemic cardiomyopathy: Principal | ICD-10-CM

## 2018-06-15 DIAGNOSIS — T82190A Other mechanical complication of cardiac electrode, initial encounter: ICD-10-CM

## 2018-06-15 DIAGNOSIS — I480 Paroxysmal atrial fibrillation: ICD-10-CM

## 2018-06-15 DIAGNOSIS — E7800 Pure hypercholesterolemia, unspecified: Principal | ICD-10-CM

## 2018-06-15 LAB — BASIC METABOLIC PANEL
Lab: 1.7 mg/dL — ABNORMAL HIGH (ref 0.4–1.24)
Lab: 108 mg/dL — ABNORMAL HIGH (ref 70–100)
Lab: 138 MMOL/L (ref 137–147)
Lab: 23 MMOL/L (ref 21–30)
Lab: 39 mL/min — ABNORMAL LOW (ref >60)
Lab: 41 mg/dL — ABNORMAL HIGH (ref 7–25)
Lab: 47 mL/min — ABNORMAL LOW (ref >60)
Lab: 5.4 MMOL/L — ABNORMAL HIGH (ref 3.5–5.1)
Lab: 8 (ref 3–12)
Lab: 8.8 mg/dL (ref 8.5–10.6)

## 2018-06-15 LAB — PROTIME INR (PT): Lab: 3 MMOL/L — ABNORMAL HIGH (ref 0.8–1.2)

## 2018-06-18 ENCOUNTER — Ambulatory Visit: Admit: 2018-06-18 | Discharge: 2018-06-19 | Payer: MEDICARE

## 2018-06-18 ENCOUNTER — Encounter: Admit: 2018-06-18 | Discharge: 2018-06-18 | Payer: MEDICARE

## 2018-06-18 DIAGNOSIS — E7800 Pure hypercholesterolemia, unspecified: ICD-10-CM

## 2018-06-18 DIAGNOSIS — I10 Essential (primary) hypertension: Principal | ICD-10-CM

## 2018-06-18 DIAGNOSIS — I255 Ischemic cardiomyopathy: ICD-10-CM

## 2018-06-18 DIAGNOSIS — Z7901 Long term (current) use of anticoagulants: ICD-10-CM

## 2018-06-18 DIAGNOSIS — I480 Paroxysmal atrial fibrillation: Principal | ICD-10-CM

## 2018-06-19 ENCOUNTER — Encounter: Admit: 2018-06-19 | Discharge: 2018-06-19 | Payer: MEDICARE

## 2018-06-19 DIAGNOSIS — I255 Ischemic cardiomyopathy: Principal | ICD-10-CM

## 2018-06-19 DIAGNOSIS — I472 Ventricular tachycardia: Secondary | ICD-10-CM

## 2018-06-19 DIAGNOSIS — Z95810 Presence of automatic (implantable) cardiac defibrillator: ICD-10-CM

## 2018-06-21 ENCOUNTER — Encounter: Admit: 2018-06-21 | Discharge: 2018-06-21 | Payer: MEDICARE

## 2018-06-22 ENCOUNTER — Encounter: Admit: 2018-06-22 | Discharge: 2018-06-22 | Payer: MEDICARE

## 2018-06-22 DIAGNOSIS — E7800 Pure hypercholesterolemia, unspecified: Principal | ICD-10-CM

## 2018-06-22 LAB — LIPID PROFILE
Lab: 127 MMOL/L (ref 98–110)
Lab: 136 MMOL/L — ABNORMAL LOW (ref 150–200)
Lab: 25 mg/dL (ref 0.4–1.24)
Lab: 3 g/dL (ref 6.0–8.0)
Lab: 40 mg/dL (ref 70–100)
Lab: 70 mg/dL (ref 7–25)

## 2018-06-22 LAB — PROTIME INR (PT): Lab: 2.5

## 2018-06-26 ENCOUNTER — Ambulatory Visit: Admit: 2018-06-26 | Discharge: 2018-06-27 | Payer: MEDICARE

## 2018-06-26 ENCOUNTER — Encounter: Admit: 2018-06-26 | Discharge: 2018-06-26 | Payer: MEDICARE

## 2018-06-26 DIAGNOSIS — E669 Obesity, unspecified: ICD-10-CM

## 2018-06-26 DIAGNOSIS — J302 Other seasonal allergic rhinitis: ICD-10-CM

## 2018-06-26 DIAGNOSIS — I340 Nonrheumatic mitral (valve) insufficiency: ICD-10-CM

## 2018-06-26 DIAGNOSIS — T148XXA Other injury of unspecified body region, initial encounter: ICD-10-CM

## 2018-06-26 DIAGNOSIS — E785 Hyperlipidemia, unspecified: ICD-10-CM

## 2018-06-26 DIAGNOSIS — R05 Cough: ICD-10-CM

## 2018-06-26 DIAGNOSIS — G4733 Obstructive sleep apnea (adult) (pediatric): ICD-10-CM

## 2018-06-26 DIAGNOSIS — M1990 Unspecified osteoarthritis, unspecified site: ICD-10-CM

## 2018-06-26 DIAGNOSIS — I472 Ventricular tachycardia: ICD-10-CM

## 2018-06-26 DIAGNOSIS — G5600 Carpal tunnel syndrome, unspecified upper limb: ICD-10-CM

## 2018-06-26 DIAGNOSIS — Z95810 Presence of automatic (implantable) cardiac defibrillator: ICD-10-CM

## 2018-06-26 DIAGNOSIS — I2510 Atherosclerotic heart disease of native coronary artery without angina pectoris: Principal | ICD-10-CM

## 2018-06-26 DIAGNOSIS — I480 Paroxysmal atrial fibrillation: Principal | ICD-10-CM

## 2018-06-26 DIAGNOSIS — K449 Diaphragmatic hernia without obstruction or gangrene: ICD-10-CM

## 2018-06-26 DIAGNOSIS — M549 Dorsalgia, unspecified: ICD-10-CM

## 2018-06-26 DIAGNOSIS — K219 Gastro-esophageal reflux disease without esophagitis: ICD-10-CM

## 2018-06-26 DIAGNOSIS — I10 Essential (primary) hypertension: ICD-10-CM

## 2018-06-26 DIAGNOSIS — I4891 Unspecified atrial fibrillation: ICD-10-CM

## 2018-06-26 DIAGNOSIS — I255 Ischemic cardiomyopathy: ICD-10-CM

## 2018-06-26 LAB — MAGNESIUM: Lab: 2.1 K/UL (ref 0–0.20)

## 2018-06-26 LAB — BASIC METABOLIC PANEL
Lab: 1.4 — ABNORMAL HIGH (ref 0.72–1.25)
Lab: 102
Lab: 107
Lab: 136 K/UL (ref 0–0.45)
Lab: 15 — ABNORMAL HIGH (ref 0–14)
Lab: 23
Lab: 40 — ABNORMAL HIGH (ref 8.4–25.7)
Lab: 8.8

## 2018-06-26 MED ORDER — ROSUVASTATIN 40 MG PO TAB
40 mg | ORAL_TABLET | Freq: Every day | ORAL | 3 refills | 90.00000 days | Status: AC
Start: 2018-06-26 — End: 2018-10-01

## 2018-06-27 ENCOUNTER — Encounter: Admit: 2018-06-27 | Discharge: 2018-06-27 | Payer: MEDICARE

## 2018-06-27 DIAGNOSIS — I480 Paroxysmal atrial fibrillation: Principal | ICD-10-CM

## 2018-06-27 DIAGNOSIS — I472 Ventricular tachycardia: ICD-10-CM

## 2018-06-27 DIAGNOSIS — I10 Essential (primary) hypertension: ICD-10-CM

## 2018-07-30 ENCOUNTER — Encounter: Admit: 2018-07-30 | Discharge: 2018-07-30 | Payer: MEDICARE

## 2018-08-20 ENCOUNTER — Encounter: Admit: 2018-08-20 | Discharge: 2018-08-20 | Payer: MEDICARE

## 2018-08-20 DIAGNOSIS — I480 Paroxysmal atrial fibrillation: Principal | ICD-10-CM

## 2018-08-20 LAB — PROTIME INR (PT): Lab: 5.6 mL/min (ref >60)

## 2018-08-27 ENCOUNTER — Encounter: Admit: 2018-08-27 | Discharge: 2018-08-27 | Payer: MEDICARE

## 2018-08-27 DIAGNOSIS — I480 Paroxysmal atrial fibrillation: Principal | ICD-10-CM

## 2018-09-03 ENCOUNTER — Encounter: Admit: 2018-09-03 | Discharge: 2018-09-03 | Payer: MEDICARE

## 2018-09-04 MED ORDER — LOSARTAN 50 MG PO TAB
ORAL_TABLET | Freq: Every day | ORAL | 3 refills | 90.00000 days | Status: AC
Start: 2018-09-04 — End: 2019-09-25

## 2018-09-04 MED ORDER — FUROSEMIDE 40 MG PO TAB
ORAL_TABLET | Freq: Once | ORAL | 3 refills | 90.00000 days | Status: AC
Start: 2018-09-04 — End: 2019-09-25

## 2018-09-13 ENCOUNTER — Encounter: Admit: 2018-09-13 | Discharge: 2018-09-13 | Payer: MEDICARE

## 2018-09-13 DIAGNOSIS — I480 Paroxysmal atrial fibrillation: Principal | ICD-10-CM

## 2018-09-13 LAB — PROTIME INR (PT): Lab: 9.4

## 2018-09-17 ENCOUNTER — Encounter: Admit: 2018-09-17 | Discharge: 2018-09-17 | Payer: MEDICARE

## 2018-09-17 DIAGNOSIS — I480 Paroxysmal atrial fibrillation: Principal | ICD-10-CM

## 2018-09-17 LAB — PROTIME INR (PT): Lab: 2.1

## 2018-09-18 ENCOUNTER — Ambulatory Visit: Admit: 2018-09-18 | Discharge: 2018-09-18 | Payer: MEDICARE

## 2018-09-18 DIAGNOSIS — Z95810 Presence of automatic (implantable) cardiac defibrillator: Principal | ICD-10-CM

## 2018-09-25 LAB — PROTIME INR (PT): Lab: 4.1

## 2018-09-26 ENCOUNTER — Encounter: Admit: 2018-09-26 | Discharge: 2018-09-26 | Payer: MEDICARE

## 2018-09-26 DIAGNOSIS — I679 Cerebrovascular disease, unspecified: Secondary | ICD-10-CM

## 2018-09-26 DIAGNOSIS — I480 Paroxysmal atrial fibrillation: Secondary | ICD-10-CM

## 2018-09-26 DIAGNOSIS — I10 Essential (primary) hypertension: Secondary | ICD-10-CM

## 2018-09-26 MED ORDER — WARFARIN 4 MG PO TAB
4 mg | ORAL_TABLET | Freq: Every day | ORAL | 3 refills | 90.00000 days | Status: AC
Start: 2018-09-26 — End: 2018-10-02

## 2018-10-01 ENCOUNTER — Encounter: Admit: 2018-10-01 | Discharge: 2018-10-01 | Payer: MEDICARE

## 2018-10-01 DIAGNOSIS — I480 Paroxysmal atrial fibrillation: Secondary | ICD-10-CM

## 2018-10-01 LAB — PROTIME INR (PT): Lab: 2.8 MMOL/L (ref 98–110)

## 2018-10-01 MED ORDER — ROSUVASTATIN 40 MG PO TAB
40 mg | ORAL_TABLET | Freq: Every day | ORAL | 2 refills | 90.00000 days | Status: AC
Start: 2018-10-01 — End: 2018-10-01

## 2018-10-01 MED ORDER — ROSUVASTATIN 40 MG PO TAB
40 mg | ORAL_TABLET | Freq: Every day | ORAL | 2 refills | 90.00000 days | Status: AC
Start: 2018-10-01 — End: 2019-09-25

## 2018-10-02 ENCOUNTER — Encounter: Admit: 2018-10-02 | Discharge: 2018-10-02 | Payer: MEDICARE

## 2018-10-02 ENCOUNTER — Ambulatory Visit: Admit: 2018-10-02 | Discharge: 2018-10-02 | Payer: MEDICARE

## 2018-10-02 DIAGNOSIS — Z7189 Other specified counseling: Secondary | ICD-10-CM

## 2018-10-02 DIAGNOSIS — E669 Obesity, unspecified: Secondary | ICD-10-CM

## 2018-10-02 DIAGNOSIS — K219 Gastro-esophageal reflux disease without esophagitis: Secondary | ICD-10-CM

## 2018-10-02 DIAGNOSIS — I255 Ischemic cardiomyopathy: Secondary | ICD-10-CM

## 2018-10-02 DIAGNOSIS — I340 Nonrheumatic mitral (valve) insufficiency: Secondary | ICD-10-CM

## 2018-10-02 DIAGNOSIS — R05 Cough: Secondary | ICD-10-CM

## 2018-10-02 DIAGNOSIS — I480 Paroxysmal atrial fibrillation: Secondary | ICD-10-CM

## 2018-10-02 DIAGNOSIS — M549 Dorsalgia, unspecified: Secondary | ICD-10-CM

## 2018-10-02 DIAGNOSIS — I4891 Unspecified atrial fibrillation: Secondary | ICD-10-CM

## 2018-10-02 DIAGNOSIS — G5600 Carpal tunnel syndrome, unspecified upper limb: Secondary | ICD-10-CM

## 2018-10-02 DIAGNOSIS — G4733 Obstructive sleep apnea (adult) (pediatric): Secondary | ICD-10-CM

## 2018-10-02 DIAGNOSIS — M1990 Unspecified osteoarthritis, unspecified site: Secondary | ICD-10-CM

## 2018-10-02 DIAGNOSIS — E785 Hyperlipidemia, unspecified: Secondary | ICD-10-CM

## 2018-10-02 DIAGNOSIS — I2510 Atherosclerotic heart disease of native coronary artery without angina pectoris: Secondary | ICD-10-CM

## 2018-10-02 DIAGNOSIS — T148XXA Other injury of unspecified body region, initial encounter: Secondary | ICD-10-CM

## 2018-10-02 DIAGNOSIS — Z95810 Presence of automatic (implantable) cardiac defibrillator: Secondary | ICD-10-CM

## 2018-10-02 DIAGNOSIS — I10 Essential (primary) hypertension: Secondary | ICD-10-CM

## 2018-10-02 DIAGNOSIS — J302 Other seasonal allergic rhinitis: Secondary | ICD-10-CM

## 2018-10-02 DIAGNOSIS — K449 Diaphragmatic hernia without obstruction or gangrene: Secondary | ICD-10-CM

## 2018-10-02 MED ORDER — APIXABAN 5 MG PO TAB
5 mg | ORAL_TABLET | Freq: Two times a day (BID) | ORAL | 3 refills | Status: AC
Start: 2018-10-02 — End: 2018-10-03

## 2018-10-02 MED ORDER — APIXABAN 5 MG PO TAB
5 mg | ORAL_TABLET | Freq: Two times a day (BID) | ORAL | 11 refills | Status: AC
Start: 2018-10-02 — End: 2018-10-02

## 2018-10-03 ENCOUNTER — Encounter: Admit: 2018-10-03 | Discharge: 2018-10-03 | Payer: MEDICARE

## 2018-10-03 MED ORDER — APIXABAN 5 MG PO TAB
5 mg | ORAL_TABLET | Freq: Two times a day (BID) | ORAL | 3 refills | Status: AC
Start: 2018-10-03 — End: 2019-03-29

## 2018-10-12 LAB — BASIC METABOLIC PANEL
Lab: 1.3 — ABNORMAL HIGH (ref 0.72–1.25)
Lab: 102
Lab: 112 — ABNORMAL HIGH (ref 70–105)
Lab: 143
Lab: 15 — ABNORMAL HIGH (ref 0–14)
Lab: 31
Lab: 34 — ABNORMAL HIGH (ref 8.4–25.7)
Lab: 4.8
Lab: 9.5

## 2018-10-15 ENCOUNTER — Encounter: Admit: 2018-10-15 | Discharge: 2018-10-15 | Payer: MEDICARE

## 2018-10-15 DIAGNOSIS — I480 Paroxysmal atrial fibrillation: Secondary | ICD-10-CM

## 2018-10-15 DIAGNOSIS — I255 Ischemic cardiomyopathy: Secondary | ICD-10-CM

## 2018-10-17 ENCOUNTER — Encounter: Admit: 2018-10-17 | Discharge: 2018-10-17 | Payer: MEDICARE

## 2018-10-17 ENCOUNTER — Ambulatory Visit: Admit: 2018-10-17 | Discharge: 2018-10-17 | Payer: MEDICARE

## 2018-10-17 DIAGNOSIS — Z5181 Encounter for therapeutic drug level monitoring: ICD-10-CM

## 2018-10-17 DIAGNOSIS — K449 Diaphragmatic hernia without obstruction or gangrene: Secondary | ICD-10-CM

## 2018-10-17 DIAGNOSIS — I5022 Chronic systolic (congestive) heart failure: Secondary | ICD-10-CM

## 2018-10-17 DIAGNOSIS — Z95810 Presence of automatic (implantable) cardiac defibrillator: Secondary | ICD-10-CM

## 2018-10-17 DIAGNOSIS — I480 Paroxysmal atrial fibrillation: Secondary | ICD-10-CM

## 2018-10-17 DIAGNOSIS — I2510 Atherosclerotic heart disease of native coronary artery without angina pectoris: Secondary | ICD-10-CM

## 2018-10-17 DIAGNOSIS — K219 Gastro-esophageal reflux disease without esophagitis: Secondary | ICD-10-CM

## 2018-10-17 DIAGNOSIS — I4891 Unspecified atrial fibrillation: Secondary | ICD-10-CM

## 2018-10-17 DIAGNOSIS — I255 Ischemic cardiomyopathy: Secondary | ICD-10-CM

## 2018-10-17 DIAGNOSIS — G5600 Carpal tunnel syndrome, unspecified upper limb: Secondary | ICD-10-CM

## 2018-10-17 DIAGNOSIS — J302 Other seasonal allergic rhinitis: Secondary | ICD-10-CM

## 2018-10-17 DIAGNOSIS — G4733 Obstructive sleep apnea (adult) (pediatric): Secondary | ICD-10-CM

## 2018-10-17 DIAGNOSIS — E669 Obesity, unspecified: Secondary | ICD-10-CM

## 2018-10-17 DIAGNOSIS — I340 Nonrheumatic mitral (valve) insufficiency: Secondary | ICD-10-CM

## 2018-10-17 DIAGNOSIS — M1990 Unspecified osteoarthritis, unspecified site: Secondary | ICD-10-CM

## 2018-10-17 DIAGNOSIS — T148XXA Other injury of unspecified body region, initial encounter: Secondary | ICD-10-CM

## 2018-10-17 DIAGNOSIS — E785 Hyperlipidemia, unspecified: Secondary | ICD-10-CM

## 2018-10-17 DIAGNOSIS — M549 Dorsalgia, unspecified: Secondary | ICD-10-CM

## 2018-10-17 DIAGNOSIS — R05 Cough: Secondary | ICD-10-CM

## 2018-10-17 DIAGNOSIS — I10 Essential (primary) hypertension: Secondary | ICD-10-CM

## 2018-10-17 MED ORDER — LIDOCAINE HCL 2 % MM JELP
Freq: Once | TOPICAL | 0 refills | Status: CN
Start: 2018-10-17 — End: ?

## 2018-10-17 MED ORDER — SODIUM CHLORIDE 0.9 % IV SOLP
INTRAVENOUS | 0 refills | Status: CN
Start: 2018-10-17 — End: ?

## 2018-10-17 MED ORDER — LIDOCAINE (PF) 10 MG/ML (1 %) IJ SOLN
.1-2 mL | INTRAMUSCULAR | 0 refills | Status: CN | PRN
Start: 2018-10-17 — End: ?

## 2018-10-23 ENCOUNTER — Encounter: Admit: 2018-10-23 | Discharge: 2018-10-23 | Payer: MEDICARE

## 2018-10-23 NOTE — Telephone Encounter
-----   Message from Golda Acre, LPN sent at 12/23/2701  4:03 PM CST -----  Regarding: RCP- admit instructions  VM from [facility] # [phone] on triage line.  She has questions about the pre admission instructions that they got in the mail.

## 2018-10-29 ENCOUNTER — Encounter: Admit: 2018-10-29 | Discharge: 2018-10-29 | Payer: MEDICARE

## 2018-10-31 ENCOUNTER — Encounter: Admit: 2018-10-31 | Discharge: 2018-10-31 | Payer: MEDICARE

## 2018-11-06 ENCOUNTER — Encounter: Admit: 2018-11-06 | Discharge: 2018-11-06 | Payer: MEDICARE

## 2018-11-07 ENCOUNTER — Encounter: Admit: 2018-11-07 | Discharge: 2018-11-07 | Payer: MEDICARE

## 2018-11-08 ENCOUNTER — Encounter: Admit: 2018-11-08 | Discharge: 2018-11-08 | Payer: MEDICARE

## 2018-11-09 ENCOUNTER — Encounter: Admit: 2018-11-09 | Discharge: 2018-11-09 | Payer: MEDICARE

## 2018-11-09 ENCOUNTER — Ambulatory Visit: Admit: 2018-11-09 | Discharge: 2018-11-09 | Payer: MEDICARE

## 2018-11-09 ENCOUNTER — Ambulatory Visit: Admit: 2018-11-09 | Discharge: 2018-11-10 | Payer: MEDICARE

## 2018-11-09 DIAGNOSIS — R05 Cough: ICD-10-CM

## 2018-11-09 DIAGNOSIS — E669 Obesity, unspecified: ICD-10-CM

## 2018-11-09 DIAGNOSIS — M549 Dorsalgia, unspecified: ICD-10-CM

## 2018-11-09 DIAGNOSIS — I340 Nonrheumatic mitral (valve) insufficiency: ICD-10-CM

## 2018-11-09 DIAGNOSIS — M1990 Unspecified osteoarthritis, unspecified site: ICD-10-CM

## 2018-11-09 DIAGNOSIS — Z95810 Presence of automatic (implantable) cardiac defibrillator: ICD-10-CM

## 2018-11-09 DIAGNOSIS — R7303 Prediabetes: ICD-10-CM

## 2018-11-09 DIAGNOSIS — I219 Acute myocardial infarction, unspecified: ICD-10-CM

## 2018-11-09 DIAGNOSIS — K449 Diaphragmatic hernia without obstruction or gangrene: ICD-10-CM

## 2018-11-09 DIAGNOSIS — K219 Gastro-esophageal reflux disease without esophagitis: ICD-10-CM

## 2018-11-09 DIAGNOSIS — G4733 Obstructive sleep apnea (adult) (pediatric): ICD-10-CM

## 2018-11-09 DIAGNOSIS — J302 Other seasonal allergic rhinitis: ICD-10-CM

## 2018-11-09 DIAGNOSIS — I4891 Unspecified atrial fibrillation: ICD-10-CM

## 2018-11-09 DIAGNOSIS — I255 Ischemic cardiomyopathy: ICD-10-CM

## 2018-11-09 DIAGNOSIS — E785 Hyperlipidemia, unspecified: ICD-10-CM

## 2018-11-09 DIAGNOSIS — I509 Heart failure, unspecified: ICD-10-CM

## 2018-11-09 DIAGNOSIS — T148XXA Other injury of unspecified body region, initial encounter: ICD-10-CM

## 2018-11-09 DIAGNOSIS — I480 Paroxysmal atrial fibrillation: Principal | ICD-10-CM

## 2018-11-09 DIAGNOSIS — I2510 Atherosclerotic heart disease of native coronary artery without angina pectoris: Principal | ICD-10-CM

## 2018-11-09 DIAGNOSIS — G5600 Carpal tunnel syndrome, unspecified upper limb: ICD-10-CM

## 2018-11-09 DIAGNOSIS — J449 Chronic obstructive pulmonary disease, unspecified: ICD-10-CM

## 2018-11-09 DIAGNOSIS — I10 Essential (primary) hypertension: ICD-10-CM

## 2018-11-09 LAB — CBC
Lab: 149 10*3/uL — ABNORMAL LOW (ref 150–400)
Lab: 15 g/dL (ref 13.5–16.5)
Lab: 16 % — ABNORMAL HIGH (ref 11–15)
Lab: 31 pg (ref 26–34)
Lab: 34 g/dL (ref 32.0–36.0)
Lab: 4.7 M/UL (ref 4.4–5.5)
Lab: 44 % (ref 40–50)
Lab: 7 10*3/uL (ref 4.5–11.0)
Lab: 7.4 FL (ref 7–11)

## 2018-11-09 LAB — MAGNESIUM: Lab: 2 mg/dL (ref 1.6–2.6)

## 2018-11-09 MED ORDER — SODIUM CHLORIDE 0.9 % IJ SOLN
50 mL | Freq: Once | INTRAVENOUS | 0 refills | Status: CP
Start: 2018-11-09 — End: ?
  Administered 2018-11-09: 20:00:00 50 mL via INTRAVENOUS

## 2018-11-09 MED ORDER — IOPAMIDOL 76 % IV SOLN
80 mL | Freq: Once | INTRAVENOUS | 0 refills | Status: CP
Start: 2018-11-09 — End: ?
  Administered 2018-11-09: 20:00:00 80 mL via INTRAVENOUS

## 2018-11-12 ENCOUNTER — Encounter: Admit: 2018-11-12 | Discharge: 2018-11-12 | Payer: MEDICARE

## 2018-11-13 ENCOUNTER — Encounter: Admit: 2018-11-13 | Discharge: 2018-11-13 | Payer: MEDICARE

## 2018-11-13 DIAGNOSIS — I480 Paroxysmal atrial fibrillation: Principal | ICD-10-CM

## 2018-11-13 LAB — BASIC METABOLIC PANEL
Lab: 1.4 — ABNORMAL HIGH (ref 0.7–1.25)
Lab: 100
Lab: 148 — ABNORMAL HIGH (ref 70–105)
Lab: 50 — ABNORMAL LOW (ref >60)
Lab: 9.2
Lab: 138
Lab: 29
Lab: 35 — ABNORMAL HIGH (ref 8.4–25.7)
Lab: 4.9

## 2018-11-22 ENCOUNTER — Encounter: Admit: 2018-11-22 | Discharge: 2018-11-22 | Payer: MEDICARE

## 2018-11-22 DIAGNOSIS — J302 Other seasonal allergic rhinitis: ICD-10-CM

## 2018-11-22 DIAGNOSIS — I10 Essential (primary) hypertension: ICD-10-CM

## 2018-11-22 DIAGNOSIS — K219 Gastro-esophageal reflux disease without esophagitis: ICD-10-CM

## 2018-11-22 DIAGNOSIS — G4733 Obstructive sleep apnea (adult) (pediatric): ICD-10-CM

## 2018-11-22 DIAGNOSIS — I219 Acute myocardial infarction, unspecified: ICD-10-CM

## 2018-11-22 DIAGNOSIS — E669 Obesity, unspecified: ICD-10-CM

## 2018-11-22 DIAGNOSIS — I2510 Atherosclerotic heart disease of native coronary artery without angina pectoris: Principal | ICD-10-CM

## 2018-11-22 DIAGNOSIS — E785 Hyperlipidemia, unspecified: ICD-10-CM

## 2018-11-22 DIAGNOSIS — M549 Dorsalgia, unspecified: ICD-10-CM

## 2018-11-22 DIAGNOSIS — I4891 Unspecified atrial fibrillation: ICD-10-CM

## 2018-11-22 DIAGNOSIS — T148XXA Other injury of unspecified body region, initial encounter: ICD-10-CM

## 2018-11-22 DIAGNOSIS — K449 Diaphragmatic hernia without obstruction or gangrene: ICD-10-CM

## 2018-11-22 DIAGNOSIS — I340 Nonrheumatic mitral (valve) insufficiency: ICD-10-CM

## 2018-11-22 DIAGNOSIS — G5600 Carpal tunnel syndrome, unspecified upper limb: ICD-10-CM

## 2018-11-22 DIAGNOSIS — R05 Cough: ICD-10-CM

## 2018-11-22 DIAGNOSIS — I509 Heart failure, unspecified: ICD-10-CM

## 2018-11-22 DIAGNOSIS — M1990 Unspecified osteoarthritis, unspecified site: ICD-10-CM

## 2018-11-22 DIAGNOSIS — R7303 Prediabetes: ICD-10-CM

## 2018-11-22 DIAGNOSIS — I255 Ischemic cardiomyopathy: ICD-10-CM

## 2018-11-22 DIAGNOSIS — J449 Chronic obstructive pulmonary disease, unspecified: ICD-10-CM

## 2018-11-22 DIAGNOSIS — Z95810 Presence of automatic (implantable) cardiac defibrillator: Secondary | ICD-10-CM

## 2018-11-22 LAB — CBC AND DIFF
Lab: 0 % (ref 0–2)
Lab: 0 % (ref 0–5)
Lab: 0 10*3/uL (ref 0–0.20)
Lab: 0 10*3/uL (ref 0–0.45)
Lab: 0.5 10*3/uL (ref 0–0.80)
Lab: 0.7 10*3/uL — ABNORMAL LOW (ref 1.0–4.8)
Lab: 13 10*3/uL — ABNORMAL HIGH (ref 1.8–7.0)
Lab: 13 g/dL (ref 13.5–16.5)
Lab: 146 10*3/uL — ABNORMAL LOW (ref 150–400)
Lab: 15 % — ABNORMAL HIGH (ref 11–15)
Lab: 3 % — ABNORMAL LOW (ref 4–12)
Lab: 31 pg — ABNORMAL LOW (ref >60)
Lab: 33 g/dL (ref 32.0–36.0)
Lab: 40 % (ref 40–50)
Lab: 5 % — ABNORMAL LOW (ref 24–44)
Lab: 7.6 FL (ref 7–11)
Lab: 92 % — ABNORMAL HIGH (ref 41–77)
Lab: 92 FL — ABNORMAL LOW (ref >60)

## 2018-11-22 LAB — COMPREHENSIVE METABOLIC PANEL
Lab: 108 MMOL/L (ref 98–110)
Lab: 138 MMOL/L (ref 137–147)

## 2018-11-22 LAB — POC PT/INR: Lab: 1.1 (ref 0.8–1.2)

## 2018-11-22 LAB — MAGNESIUM: Lab: 2.1 mg/dL (ref 1.6–2.6)

## 2018-11-22 MED ORDER — LIDOCAINE (PF) 200 MG/10 ML (2 %) IJ SYRG
0 refills | Status: DC
Start: 2018-11-22 — End: 2018-11-22

## 2018-11-22 MED ORDER — EZETIMIBE 10 MG PO TAB
10 mg | Freq: Every evening | ORAL | 0 refills | Status: DC
Start: 2018-11-22 — End: 2018-11-25
  Administered 2018-11-23 – 2018-11-25 (×3): 10 mg via ORAL

## 2018-11-22 MED ORDER — CEFAZOLIN 1 GRAM IJ SOLR
0 refills | Status: DC
Start: 2018-11-22 — End: 2018-11-22

## 2018-11-22 MED ORDER — FENTANYL CITRATE (PF) 50 MCG/ML IJ SOLN
50 ug | Freq: Once | INTRAVENOUS | 0 refills | Status: AC | PRN
Start: 2018-11-22 — End: ?

## 2018-11-22 MED ORDER — TEMAZEPAM 15 MG PO CAP
15 mg | Freq: Every evening | ORAL | 0 refills | Status: DC | PRN
Start: 2018-11-22 — End: 2018-11-25

## 2018-11-22 MED ORDER — SPIRONOLACTONE 25 MG PO TAB
25 mg | Freq: Every evening | ORAL | 0 refills | Status: DC
Start: 2018-11-22 — End: 2018-11-25
  Administered 2018-11-23 – 2018-11-25 (×3): 25 mg via ORAL

## 2018-11-22 MED ORDER — ASPIRIN 81 MG PO CHEW
81 mg | Freq: Every evening | ORAL | 0 refills | Status: DC
Start: 2018-11-22 — End: 2018-11-25
  Administered 2018-11-23 – 2018-11-25 (×3): 81 mg via ORAL

## 2018-11-22 MED ORDER — SODIUM CHLORIDE 0.9 % IV SOLP
INTRAVENOUS | 0 refills | Status: AC
Start: 2018-11-22 — End: ?
  Administered 2018-11-22: 13:00:00 1000 mL via INTRAVENOUS

## 2018-11-22 MED ORDER — DOFETILIDE 500 MCG PO CAP
500 ug | ORAL | 0 refills | Status: DC
Start: 2018-11-22 — End: 2018-11-25
  Administered 2018-11-23 – 2018-11-24 (×5): 500 ug via ORAL

## 2018-11-22 MED ORDER — PROMETHAZINE 25 MG/ML IJ SOLN
6.25 mg | INTRAVENOUS | 0 refills | Status: DC | PRN
Start: 2018-11-22 — End: 2018-11-23

## 2018-11-22 MED ORDER — LACTATED RINGERS IV SOLP
INTRAVENOUS | 0 refills | Status: DC
Start: 2018-11-22 — End: 2018-11-23

## 2018-11-22 MED ORDER — APIXABAN 5 MG PO TAB
5 mg | Freq: Two times a day (BID) | ORAL | 0 refills | Status: DC
Start: 2018-11-22 — End: 2018-11-25
  Administered 2018-11-22 – 2018-11-25 (×6): 5 mg via ORAL

## 2018-11-22 MED ORDER — POTASSIUM CHLORIDE IN WATER 10 MEQ/50 ML IV PGBK
10 meq | INTRAVENOUS | 0 refills | Status: DC | PRN
Start: 2018-11-22 — End: 2018-11-25

## 2018-11-22 MED ORDER — OXYCODONE 5 MG PO TAB
5-10 mg | Freq: Once | ORAL | 0 refills | Status: AC | PRN
Start: 2018-11-22 — End: ?

## 2018-11-22 MED ORDER — PROPOFOL INJ 10 MG/ML IV VIAL
0 refills | Status: DC
Start: 2018-11-22 — End: 2018-11-22

## 2018-11-22 MED ORDER — ONDANSETRON HCL (PF) 4 MG/2 ML IJ SOLN
INTRAVENOUS | 0 refills | Status: DC
Start: 2018-11-22 — End: 2018-11-22

## 2018-11-22 MED ORDER — MAGNESIUM CHLORIDE 64 MG PO TBER
535 mg | ORAL | 0 refills | Status: DC | PRN
Start: 2018-11-22 — End: 2018-11-25

## 2018-11-22 MED ORDER — POTASSIUM CHLORIDE 20 MEQ PO TBTQ
20-40 meq | ORAL | 0 refills | Status: AC | PRN
Start: 2018-11-22 — End: ?

## 2018-11-22 MED ORDER — HYDROMORPHONE (PF) 2 MG/ML IJ SYRG
.5-1 mg | INTRAVENOUS | 0 refills | Status: DC | PRN
Start: 2018-11-22 — End: 2018-11-23

## 2018-11-22 MED ORDER — ALUMINUM-MAGNESIUM HYDROXIDE 200-200 MG/5 ML PO SUSP
30 mL | ORAL | 0 refills | Status: DC | PRN
Start: 2018-11-22 — End: 2018-11-25

## 2018-11-22 MED ORDER — LIDOCAINE HCL 2 % MM JELP
Freq: Once | TOPICAL | 0 refills | Status: AC
Start: 2018-11-22 — End: ?

## 2018-11-22 MED ORDER — PHENYLEPHRINE 40 MCG/ML IN NS IV DRIP (STD CONC)
0 refills | Status: DC
Start: 2018-11-22 — End: 2018-11-22

## 2018-11-22 MED ORDER — LOSARTAN 50 MG PO TAB
50 mg | Freq: Every evening | ORAL | 0 refills | Status: DC
Start: 2018-11-22 — End: 2018-11-25
  Administered 2018-11-24 – 2018-11-25 (×2): 50 mg via ORAL

## 2018-11-22 MED ORDER — METOPROLOL SUCCINATE 50 MG PO TB24
100 mg | Freq: Every day | ORAL | 0 refills | Status: DC
Start: 2018-11-22 — End: 2018-11-25
  Administered 2018-11-24 – 2018-11-25 (×2): 100 mg via ORAL

## 2018-11-22 MED ORDER — DIPHENHYDRAMINE HCL 50 MG/ML IJ SOLN
25 mg | Freq: Once | INTRAVENOUS | 0 refills | Status: AC | PRN
Start: 2018-11-22 — End: ?

## 2018-11-22 MED ORDER — POTASSIUM CHLORIDE 20 MEQ/15 ML PO LIQD
20-40 meq | ORAL | 0 refills | Status: AC | PRN
Start: 2018-11-22 — End: ?

## 2018-11-22 MED ORDER — PANTOPRAZOLE 40 MG PO TBEC
40 mg | Freq: Two times a day (BID) | ORAL | 0 refills | Status: DC
Start: 2018-11-22 — End: 2018-11-25
  Administered 2018-11-23 – 2018-11-25 (×6): 40 mg via ORAL

## 2018-11-22 MED ORDER — POTASSIUM CHLORIDE 20 MEQ PO TBTQ
40-60 meq | ORAL | 0 refills | Status: DC | PRN
Start: 2018-11-22 — End: 2018-11-25

## 2018-11-22 MED ORDER — PROTAMINE IVPB
20 mg | INTRAVENOUS | 0 refills | Status: AC | PRN
Start: 2018-11-22 — End: ?

## 2018-11-22 MED ORDER — TAMSULOSIN 0.4 MG PO CAP
.4 mg | Freq: Every evening | ORAL | 0 refills | Status: DC
Start: 2018-11-22 — End: 2018-11-25
  Administered 2018-11-23 – 2018-11-25 (×3): 0.4 mg via ORAL

## 2018-11-22 MED ORDER — DOCUSATE SODIUM 100 MG PO CAP
100 mg | Freq: Every day | ORAL | 0 refills | Status: DC | PRN
Start: 2018-11-22 — End: 2018-11-25

## 2018-11-22 MED ORDER — SUCRALFATE 1 GRAM PO TAB
1 g | Freq: Before meals | ORAL | 0 refills | Status: DC
Start: 2018-11-22 — End: 2018-11-25
  Administered 2018-11-23 – 2018-11-25 (×6): 1 g via ORAL

## 2018-11-22 MED ORDER — ACETAMINOPHEN 325 MG PO TAB
650 mg | ORAL | 0 refills | Status: DC | PRN
Start: 2018-11-22 — End: 2018-11-25

## 2018-11-22 MED ORDER — GLYCOPYRROLATE 0.2 MG/ML IJ SOLN
0 refills | Status: DC
Start: 2018-11-22 — End: 2018-11-22

## 2018-11-22 MED ORDER — LIDOCAINE (PF) 10 MG/ML (1 %) IJ SOLN
.1-2 mL | INTRAMUSCULAR | 0 refills | Status: DC | PRN
Start: 2018-11-22 — End: 2018-11-25

## 2018-11-22 MED ORDER — HALOPERIDOL LACTATE 5 MG/ML IJ SOLN
1 mg | Freq: Once | INTRAVENOUS | 0 refills | Status: AC | PRN
Start: 2018-11-22 — End: ?

## 2018-11-22 MED ORDER — FENTANYL CITRATE (PF) 50 MCG/ML IJ SOLN
25-50 ug | INTRAVENOUS | 0 refills | Status: DC | PRN
Start: 2018-11-22 — End: 2018-11-23

## 2018-11-22 MED ORDER — FENTANYL CITRATE (PF) 50 MCG/ML IJ SOLN
0 refills | Status: DC
Start: 2018-11-22 — End: 2018-11-22

## 2018-11-22 MED ORDER — NEOSTIGMINE METHYLSULFATE 1 MG/ML IJ SOLN
0 refills | Status: DC
Start: 2018-11-22 — End: 2018-11-22

## 2018-11-22 MED ORDER — FAMOTIDINE 20 MG PO TAB
40 mg | Freq: Every evening | ORAL | 0 refills | Status: DC
Start: 2018-11-22 — End: 2018-11-23

## 2018-11-22 MED ORDER — GABAPENTIN 300 MG PO CAP
300 mg | Freq: Three times a day (TID) | ORAL | 0 refills | Status: DC
Start: 2018-11-22 — End: 2018-11-25
  Administered 2018-11-22 – 2018-11-25 (×8): 300 mg via ORAL

## 2018-11-22 MED ORDER — CEPHALEXIN 500 MG PO CAP
500 mg | ORAL_CAPSULE | Freq: Four times a day (QID) | ORAL | 0 refills | Status: CN
Start: 2018-11-22 — End: ?

## 2018-11-22 MED ORDER — ROSUVASTATIN 20 MG PO TAB
40 mg | Freq: Every evening | ORAL | 0 refills | Status: DC
Start: 2018-11-22 — End: 2018-11-25
  Administered 2018-11-23 – 2018-11-25 (×3): 40 mg via ORAL

## 2018-11-22 MED ORDER — FUROSEMIDE 40 MG PO TAB
40 mg | Freq: Once | ORAL | 0 refills | Status: CP
Start: 2018-11-22 — End: ?
  Administered 2018-11-23: 14:00:00 40 mg via ORAL

## 2018-11-22 MED ORDER — POTASSIUM CHLORIDE 20 MEQ/15 ML PO LIQD
40-60 meq | NASOGASTRIC | 0 refills | Status: DC | PRN
Start: 2018-11-22 — End: 2018-11-25

## 2018-11-22 MED ORDER — DEXAMETHASONE SODIUM PHOSPHATE 4 MG/ML IJ SOLN
INTRAVENOUS | 0 refills | Status: DC
Start: 2018-11-22 — End: 2018-11-22

## 2018-11-22 MED ORDER — MAGNESIUM SULFATE IN D5W 1 GRAM/100 ML IV PGBK
1 g | INTRAVENOUS | 0 refills | Status: DC | PRN
Start: 2018-11-22 — End: 2018-11-25

## 2018-11-22 MED ORDER — ROCURONIUM 10 MG/ML IV SOLN
INTRAVENOUS | 0 refills | Status: DC
Start: 2018-11-22 — End: 2018-11-22

## 2018-11-23 ENCOUNTER — Inpatient Hospital Stay: Admit: 2018-11-23 | Discharge: 2018-11-24 | Payer: MEDICARE

## 2018-11-23 DIAGNOSIS — I4891 Unspecified atrial fibrillation: ICD-10-CM

## 2018-11-23 LAB — POC ACTIVATED CLOTTING TIME
Lab: 375 s
Lab: 412 s
Lab: 352 s
Lab: 355 s
Lab: 349 s
Lab: 357 s
Lab: 232 s — ABNORMAL HIGH (ref 0.3–1.0)
Lab: 366 s

## 2018-11-23 LAB — CBC: Lab: 15 10*3/uL — ABNORMAL HIGH (ref 4.5–11.0)

## 2018-11-23 LAB — BASIC METABOLIC PANEL: Lab: 138 MMOL/L — ABNORMAL LOW (ref 137–147)

## 2018-11-23 LAB — PROTIME INR (PT): Lab: 1.5 M/UL — ABNORMAL HIGH (ref >40)

## 2018-11-23 MED ORDER — FLUTICASONE PROPION-SALMETEROL 250-50 MCG/DOSE IN DSDV
1 | Freq: Two times a day (BID) | RESPIRATORY_TRACT | 0 refills | Status: DC
Start: 2018-11-23 — End: 2018-11-25

## 2018-11-23 MED ORDER — FUROSEMIDE 40 MG PO TAB
80 mg | Freq: Every day | ORAL | 0 refills | Status: DC
Start: 2018-11-23 — End: 2018-11-25
  Administered 2018-11-24 – 2018-11-25 (×2): 80 mg via ORAL

## 2018-11-23 MED ORDER — AMOXICILLIN-POT CLAVULANATE 875-125 MG PO TAB
875 mg | Freq: Two times a day (BID) | ORAL | 0 refills | Status: DC
Start: 2018-11-23 — End: 2018-11-25
  Administered 2018-11-23 – 2018-11-25 (×5): 875 mg via ORAL

## 2018-11-24 ENCOUNTER — Encounter: Admit: 2018-11-24 | Discharge: 2018-11-24 | Payer: MEDICARE

## 2018-11-24 LAB — PROTIME INR (PT): Lab: 1.4 MMOL/L — ABNORMAL HIGH (ref >60)

## 2018-11-24 MED ORDER — DOFETILIDE 500 MCG PO CAP
500 ug | ORAL_CAPSULE | Freq: Two times a day (BID) | ORAL | 1 refills | Status: AC
Start: 2018-11-24 — End: 2018-11-24
  Filled 2018-11-25: qty 60, 30d supply, fill #1

## 2018-11-24 MED ORDER — DOFETILIDE 500 MCG PO CAP
500 ug | ORAL_CAPSULE | Freq: Two times a day (BID) | ORAL | 0 refills | Status: AC
Start: 2018-11-24 — End: 2019-03-04

## 2018-11-24 MED ORDER — DOFETILIDE 500 MCG PO CAP
500 ug | ORAL_CAPSULE | Freq: Two times a day (BID) | ORAL | 1 refills | Status: AC
Start: 2018-11-24 — End: 2019-07-09
  Filled 2018-11-25: qty 60, 30d supply, fill #1

## 2018-11-24 MED ORDER — DOFETILIDE 250 MCG PO CAP
250 ug | ORAL | 0 refills | Status: DC
Start: 2018-11-24 — End: 2018-11-25
  Administered 2018-11-25: 06:00:00 250 ug via ORAL

## 2018-11-24 MED ORDER — POLYETHYLENE GLYCOL 3350 17 GRAM PO PWPK
1 | Freq: Every day | ORAL | 0 refills | Status: DC
Start: 2018-11-24 — End: 2018-11-25
  Administered 2018-11-25 (×2): 17 g via ORAL

## 2018-11-24 MED ORDER — DOFETILIDE 500 MCG PO CAP
500 ug | ORAL_CAPSULE | Freq: Two times a day (BID) | ORAL | 0 refills | Status: AC
Start: 2018-11-24 — End: 2018-11-24
  Filled 2018-11-24: qty 60, 30d supply

## 2018-11-25 ENCOUNTER — Encounter
Admit: 2018-11-22 | Discharge: 2018-11-25 | Disposition: A | Discharge status: Home / Self Care | Payer: MEDICARE | Admitting: Cardiovascular Disease

## 2018-11-25 ENCOUNTER — Encounter: Admit: 2018-11-09 | Discharge: 2018-11-09 | Payer: MEDICARE

## 2018-11-25 ENCOUNTER — Encounter: Admit: 2018-11-22 | Discharge: 2018-11-23 | Payer: MEDICARE

## 2018-11-25 ENCOUNTER — Encounter: Admit: 2018-11-25 | Discharge: 2018-11-25 | Payer: MEDICARE

## 2018-11-25 DIAGNOSIS — I509 Heart failure, unspecified: ICD-10-CM

## 2018-11-25 DIAGNOSIS — I2510 Atherosclerotic heart disease of native coronary artery without angina pectoris: ICD-10-CM

## 2018-11-25 DIAGNOSIS — N400 Enlarged prostate without lower urinary tract symptoms: Secondary | ICD-10-CM

## 2018-11-25 DIAGNOSIS — I110 Hypertensive heart disease with heart failure: ICD-10-CM

## 2018-11-25 DIAGNOSIS — K219 Gastro-esophageal reflux disease without esophagitis: ICD-10-CM

## 2018-11-25 DIAGNOSIS — Z79899 Other long term (current) drug therapy: ICD-10-CM

## 2018-11-25 DIAGNOSIS — E785 Hyperlipidemia, unspecified: ICD-10-CM

## 2018-11-25 DIAGNOSIS — Z95810 Presence of automatic (implantable) cardiac defibrillator: ICD-10-CM

## 2018-11-25 DIAGNOSIS — Z7901 Long term (current) use of anticoagulants: ICD-10-CM

## 2018-11-25 DIAGNOSIS — G4733 Obstructive sleep apnea (adult) (pediatric): ICD-10-CM

## 2018-11-25 DIAGNOSIS — Z96653 Presence of artificial knee joint, bilateral: ICD-10-CM

## 2018-11-25 DIAGNOSIS — I472 Ventricular tachycardia: ICD-10-CM

## 2018-11-25 DIAGNOSIS — I4510 Unspecified right bundle-branch block: ICD-10-CM

## 2018-11-25 DIAGNOSIS — J449 Chronic obstructive pulmonary disease, unspecified: ICD-10-CM

## 2018-11-25 DIAGNOSIS — Z952 Presence of prosthetic heart valve: ICD-10-CM

## 2018-11-25 DIAGNOSIS — Z951 Presence of aortocoronary bypass graft: ICD-10-CM

## 2018-11-25 DIAGNOSIS — Q211 Atrial septal defect: ICD-10-CM

## 2018-11-25 DIAGNOSIS — I252 Old myocardial infarction: ICD-10-CM

## 2018-11-25 DIAGNOSIS — I480 Paroxysmal atrial fibrillation: Principal | ICD-10-CM

## 2018-11-25 MED ORDER — ALBUTEROL SULFATE 90 MCG/ACTUATION IN HFAA
2 | RESPIRATORY_TRACT | 0 refills | Status: DC | PRN
Start: 2018-11-25 — End: 2018-11-25

## 2018-11-25 MED ORDER — DOFETILIDE 500 MCG PO CAP
500 ug | ORAL | 0 refills | Status: DC
Start: 2018-11-25 — End: 2018-11-25
  Administered 2018-11-25: 16:00:00 500 ug via ORAL

## 2018-11-26 ENCOUNTER — Encounter: Admit: 2018-11-26 | Discharge: 2018-11-26 | Payer: MEDICARE

## 2018-11-26 DIAGNOSIS — I4891 Unspecified atrial fibrillation: ICD-10-CM

## 2018-11-26 DIAGNOSIS — I2510 Atherosclerotic heart disease of native coronary artery without angina pectoris: Principal | ICD-10-CM

## 2018-11-26 DIAGNOSIS — J302 Other seasonal allergic rhinitis: ICD-10-CM

## 2018-11-26 DIAGNOSIS — I219 Acute myocardial infarction, unspecified: ICD-10-CM

## 2018-11-26 DIAGNOSIS — I340 Nonrheumatic mitral (valve) insufficiency: ICD-10-CM

## 2018-11-26 DIAGNOSIS — I255 Ischemic cardiomyopathy: ICD-10-CM

## 2018-11-26 DIAGNOSIS — M549 Dorsalgia, unspecified: ICD-10-CM

## 2018-11-26 DIAGNOSIS — I10 Essential (primary) hypertension: ICD-10-CM

## 2018-11-26 DIAGNOSIS — K449 Diaphragmatic hernia without obstruction or gangrene: ICD-10-CM

## 2018-11-26 DIAGNOSIS — K219 Gastro-esophageal reflux disease without esophagitis: ICD-10-CM

## 2018-11-26 DIAGNOSIS — J449 Chronic obstructive pulmonary disease, unspecified: ICD-10-CM

## 2018-11-26 DIAGNOSIS — E669 Obesity, unspecified: ICD-10-CM

## 2018-11-26 DIAGNOSIS — G4733 Obstructive sleep apnea (adult) (pediatric): ICD-10-CM

## 2018-11-26 DIAGNOSIS — G5600 Carpal tunnel syndrome, unspecified upper limb: ICD-10-CM

## 2018-11-26 DIAGNOSIS — R7303 Prediabetes: ICD-10-CM

## 2018-11-26 DIAGNOSIS — I509 Heart failure, unspecified: ICD-10-CM

## 2018-11-26 DIAGNOSIS — T148XXA Other injury of unspecified body region, initial encounter: ICD-10-CM

## 2018-11-26 DIAGNOSIS — E785 Hyperlipidemia, unspecified: ICD-10-CM

## 2018-11-26 DIAGNOSIS — M1990 Unspecified osteoarthritis, unspecified site: ICD-10-CM

## 2018-11-26 DIAGNOSIS — Z95810 Presence of automatic (implantable) cardiac defibrillator: Secondary | ICD-10-CM

## 2018-11-26 DIAGNOSIS — R05 Cough: ICD-10-CM

## 2018-11-27 ENCOUNTER — Encounter: Admit: 2018-11-27 | Discharge: 2018-11-27 | Payer: MEDICARE

## 2018-11-27 MED ORDER — METOPROLOL SUCCINATE 100 MG PO TB24
100 mg | ORAL_TABLET | Freq: Every day | ORAL | 3 refills | 90.00000 days | Status: AC
Start: 2018-11-27 — End: 2019-09-25

## 2018-11-29 ENCOUNTER — Encounter: Admit: 2018-11-29 | Discharge: 2018-11-29 | Payer: MEDICARE

## 2018-12-13 ENCOUNTER — Ambulatory Visit: Admit: 2018-12-13 | Discharge: 2018-12-13 | Payer: MEDICARE

## 2018-12-13 ENCOUNTER — Encounter: Admit: 2018-12-13 | Discharge: 2018-12-13 | Payer: MEDICARE

## 2018-12-13 DIAGNOSIS — Z95810 Presence of automatic (implantable) cardiac defibrillator: ICD-10-CM

## 2018-12-13 DIAGNOSIS — I472 Ventricular tachycardia: Principal | ICD-10-CM

## 2018-12-13 DIAGNOSIS — I255 Ischemic cardiomyopathy: ICD-10-CM

## 2018-12-13 NOTE — Telephone Encounter
Called pt to review e-visit flow for tomorrow. Pt indicates he can access MyChart but he will be using his partner's iPhone for e-visit. Partner will be present. Encouraged pt to tell partner to download Zoom app before e-visit tomorrow. Pt indicates he is capable of taking BP and weight measurements at home and will do so before appt tomorrow. Reports his BP readings have been low. I indicated I will call pt 20-30 minutes prior to appt tomorrow to ensure his access to video call. Pt understands flow for e-visit tomorrow, and is aware of date and time.

## 2018-12-13 NOTE — Telephone Encounter
We received a Carelink transmission.

## 2018-12-13 NOTE — Telephone Encounter
-----   Message from [facility] sent at 12/13/2018  8:52 AM CDT -----  Regarding: FW: Remote Transmission  We did not receive a Carelink transmission. Pt has E-visit with RCP 12/14/18. I called and LM for patient to send. I left phone number to Stay Connected if he needs assistance. Have we received a Carelink transmission?   ----- Message -----  From: Gordy Savers  Sent: 12/12/2018  12:49 PM CDT  To: Gildardo Griffes Ep Remote  Subject: FW: Remote Transmission                          Nothing as of 12:40 pm on 12/12/18. I will forward this to later today.   ----- Message -----  From: Rubye Beach  Sent: 12/12/2018  11:57 AM CDT  To: Gildardo Griffes Ep Remote  Subject: Remote Transmission                              E-Visit  with Marya Amsler on 12/13/2018, please watch for remote.

## 2018-12-13 NOTE — Telephone Encounter
Called pt regarding abnormal optivol. He reports taking 80mg  of furosemide daily and only 25mg  spironolactone daily (it is prescribed BID). He says that Dr. Hale Bogus told him at January visit to decrease to daily, but this was never changed in the chart and there is not note of this in OV note. Pt said his weight is up 3 lbs today. He weighs 252.6lbs. Yesterdays weight 249 lbs. He believes his dry weight is around 249 ( he only ever gets as low as 246-247 but not often) He says he has had some swelling in his legs recently but believes this is actually getting better. Denies SOB. He reports making soup yesterday and probably had too much sodium. He said his weight will go up about once a week correlated to his diet. He was planning on restricting sodium today. He will also take spironolactone BID today as prescribed. He has not checked his BP. I asked him to have him weight, BP and HR ready for e-visit tomorrow with RCP.

## 2018-12-13 NOTE — Telephone Encounter
-----   Message from Gordy Savers sent at 12/13/2018 10:30 AM CDT -----  Regarding: RCP: Abnormal Optivol on 12/13/18 remote.  Abnormal Optivol on 12/13/18 remote. Normal device function. 5 AF episodes, longest 5.5 minutes. Known PAF. OAC on med list. Pt has E-Visit with RCP tomorrow.

## 2018-12-14 ENCOUNTER — Encounter: Admit: 2018-12-14 | Discharge: 2018-12-14 | Payer: MEDICARE

## 2018-12-14 ENCOUNTER — Ambulatory Visit: Admit: 2018-12-14 | Discharge: 2018-12-14 | Payer: MEDICARE

## 2018-12-14 DIAGNOSIS — M549 Dorsalgia, unspecified: ICD-10-CM

## 2018-12-14 DIAGNOSIS — E785 Hyperlipidemia, unspecified: ICD-10-CM

## 2018-12-14 DIAGNOSIS — G4733 Obstructive sleep apnea (adult) (pediatric): ICD-10-CM

## 2018-12-14 DIAGNOSIS — G5600 Carpal tunnel syndrome, unspecified upper limb: ICD-10-CM

## 2018-12-14 DIAGNOSIS — Z5181 Encounter for therapeutic drug level monitoring: ICD-10-CM

## 2018-12-14 DIAGNOSIS — R7303 Prediabetes: ICD-10-CM

## 2018-12-14 DIAGNOSIS — I255 Ischemic cardiomyopathy: ICD-10-CM

## 2018-12-14 DIAGNOSIS — I509 Heart failure, unspecified: ICD-10-CM

## 2018-12-14 DIAGNOSIS — I10 Essential (primary) hypertension: ICD-10-CM

## 2018-12-14 DIAGNOSIS — K219 Gastro-esophageal reflux disease without esophagitis: ICD-10-CM

## 2018-12-14 DIAGNOSIS — I472 Ventricular tachycardia: ICD-10-CM

## 2018-12-14 DIAGNOSIS — R05 Cough: ICD-10-CM

## 2018-12-14 DIAGNOSIS — I480 Paroxysmal atrial fibrillation: Principal | ICD-10-CM

## 2018-12-14 DIAGNOSIS — M1990 Unspecified osteoarthritis, unspecified site: ICD-10-CM

## 2018-12-14 DIAGNOSIS — Z95810 Presence of automatic (implantable) cardiac defibrillator: Secondary | ICD-10-CM

## 2018-12-14 DIAGNOSIS — I4891 Unspecified atrial fibrillation: ICD-10-CM

## 2018-12-14 DIAGNOSIS — I219 Acute myocardial infarction, unspecified: ICD-10-CM

## 2018-12-14 DIAGNOSIS — I2510 Atherosclerotic heart disease of native coronary artery without angina pectoris: Principal | ICD-10-CM

## 2018-12-14 DIAGNOSIS — J302 Other seasonal allergic rhinitis: ICD-10-CM

## 2018-12-14 DIAGNOSIS — T148XXA Other injury of unspecified body region, initial encounter: ICD-10-CM

## 2018-12-14 DIAGNOSIS — K449 Diaphragmatic hernia without obstruction or gangrene: ICD-10-CM

## 2018-12-14 DIAGNOSIS — E669 Obesity, unspecified: ICD-10-CM

## 2018-12-14 DIAGNOSIS — J449 Chronic obstructive pulmonary disease, unspecified: ICD-10-CM

## 2018-12-14 DIAGNOSIS — I340 Nonrheumatic mitral (valve) insufficiency: ICD-10-CM

## 2018-12-17 ENCOUNTER — Encounter: Admit: 2018-12-17 | Discharge: 2018-12-17 | Payer: MEDICARE

## 2018-12-17 ENCOUNTER — Ambulatory Visit: Admit: 2018-12-17 | Discharge: 2018-12-17 | Payer: MEDICARE

## 2018-12-17 DIAGNOSIS — Z95810 Presence of automatic (implantable) cardiac defibrillator: Principal | ICD-10-CM

## 2018-12-17 DIAGNOSIS — I472 Ventricular tachycardia: ICD-10-CM

## 2018-12-17 DIAGNOSIS — E7800 Pure hypercholesterolemia, unspecified: Principal | ICD-10-CM

## 2018-12-17 DIAGNOSIS — I255 Ischemic cardiomyopathy: ICD-10-CM

## 2018-12-26 ENCOUNTER — Encounter: Admit: 2018-12-26 | Discharge: 2018-12-26 | Payer: MEDICARE

## 2018-12-26 MED ORDER — SPIRONOLACTONE 25 MG PO TAB
ORAL_TABLET | Freq: Two times a day (BID) | ORAL | 3 refills | 90.00000 days | Status: DC
Start: 2018-12-26 — End: 2019-03-04

## 2019-01-30 ENCOUNTER — Encounter: Admit: 2019-01-30 | Discharge: 2019-01-30 | Payer: MEDICARE

## 2019-01-30 DIAGNOSIS — E7800 Pure hypercholesterolemia, unspecified: Principal | ICD-10-CM

## 2019-01-30 LAB — LIPID PROFILE
Lab: 19
Lab: 28 — ABNORMAL LOW (ref >40)
Lab: 4
Lab: 51
Lab: 93
Lab: 98

## 2019-02-14 ENCOUNTER — Encounter: Admit: 2019-02-14 | Discharge: 2019-02-14 | Payer: MEDICARE

## 2019-03-04 ENCOUNTER — Ambulatory Visit: Admit: 2019-03-04 | Discharge: 2019-03-05

## 2019-03-04 ENCOUNTER — Encounter: Admit: 2019-03-04 | Discharge: 2019-03-04

## 2019-03-04 DIAGNOSIS — E785 Hyperlipidemia, unspecified: Secondary | ICD-10-CM

## 2019-03-04 DIAGNOSIS — I10 Essential (primary) hypertension: Secondary | ICD-10-CM

## 2019-03-04 DIAGNOSIS — G5600 Carpal tunnel syndrome, unspecified upper limb: Secondary | ICD-10-CM

## 2019-03-04 DIAGNOSIS — I255 Ischemic cardiomyopathy: Secondary | ICD-10-CM

## 2019-03-04 DIAGNOSIS — I509 Heart failure, unspecified: Secondary | ICD-10-CM

## 2019-03-04 DIAGNOSIS — R05 Cough: Secondary | ICD-10-CM

## 2019-03-04 DIAGNOSIS — I2510 Atherosclerotic heart disease of native coronary artery without angina pectoris: Secondary | ICD-10-CM

## 2019-03-04 DIAGNOSIS — R7303 Prediabetes: Secondary | ICD-10-CM

## 2019-03-04 DIAGNOSIS — T148XXA Other injury of unspecified body region, initial encounter: Secondary | ICD-10-CM

## 2019-03-04 DIAGNOSIS — M549 Dorsalgia, unspecified: Secondary | ICD-10-CM

## 2019-03-04 DIAGNOSIS — I219 Acute myocardial infarction, unspecified: Secondary | ICD-10-CM

## 2019-03-04 DIAGNOSIS — J302 Other seasonal allergic rhinitis: Secondary | ICD-10-CM

## 2019-03-04 DIAGNOSIS — J449 Chronic obstructive pulmonary disease, unspecified: Secondary | ICD-10-CM

## 2019-03-04 DIAGNOSIS — G4733 Obstructive sleep apnea (adult) (pediatric): Secondary | ICD-10-CM

## 2019-03-04 DIAGNOSIS — Z01810 Encounter for preprocedural cardiovascular examination: Principal | ICD-10-CM

## 2019-03-04 DIAGNOSIS — K449 Diaphragmatic hernia without obstruction or gangrene: Secondary | ICD-10-CM

## 2019-03-04 DIAGNOSIS — M1990 Unspecified osteoarthritis, unspecified site: Secondary | ICD-10-CM

## 2019-03-04 DIAGNOSIS — K219 Gastro-esophageal reflux disease without esophagitis: Secondary | ICD-10-CM

## 2019-03-04 DIAGNOSIS — I340 Nonrheumatic mitral (valve) insufficiency: Secondary | ICD-10-CM

## 2019-03-04 DIAGNOSIS — E669 Obesity, unspecified: Secondary | ICD-10-CM

## 2019-03-04 DIAGNOSIS — Z95810 Presence of automatic (implantable) cardiac defibrillator: Secondary | ICD-10-CM

## 2019-03-04 DIAGNOSIS — I4891 Unspecified atrial fibrillation: Secondary | ICD-10-CM

## 2019-03-04 MED ORDER — SPIRONOLACTONE 25 MG PO TAB
25 mg | ORAL_TABLET | Freq: Every day | ORAL | 3 refills | 90.00000 days | Status: DC
Start: 2019-03-04 — End: 2019-09-26

## 2019-03-04 NOTE — Progress Notes
Date of Service: 03/04/2019    Johnny [name] is a 73 y.o. male.       HPI     Johnny [name]  is here today principally for preop CV evaluation in anticipation of right knee revision to be done at [facility] by Dr. Trisha Mangle.  Johnny [name] has had pain for quite some time but is only been recently that the etiology has become clear enough that Dr. Dareen Piano can develop surgical treatment plan.  He does not need an MRI to fully define his surgical approach.  Johnny [name] does have a MRI conditional dual-chamber defibrillator but even with an MRI conditional device there is an extensive monitoring protocol the needs to be followed.    He is out nearly 14 years from a mitral valvuloplasty done because of severe MR.  He has had excellent results with improved LV function on meds.  His EF was 57% in April 2019 when it was evaluated at [facility].    He is out 14 years from bypass with excellent LDL suppression.  He has no angina.  His nuclear scan [facility] [place] showed normal perfusion in addition to a normal LV ejection fraction.   01/30/2019    Cholesterol 98   Triglycerides 93   HDL 28 (L)   LDL 51     Is a knee pain that limits his activities.  He is stamina is less than it was but I think this is proportional to the degree of slowing he has had to subject himself to because of the knee pain.  He is here with a cane today.  His wife dropped him off in the front of the hospital.       Vitals:    03/04/19 1003   BP: 124/72   BP Source: Arm, Left Upper   Pulse: 82   Weight: 114.3 kg (251 lb 14.4 oz)   Height: 1.854 m (6' 0.99)   PainSc: Zero     Body mass index is 33.24 kg/m???.     Past Medical History  Patient Active Problem List    Diagnosis Date Noted   ??? 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  04/14/05 #28 [place] ring, Resect P2 Seg Post leaflet,ring.as above w/ CABG-[place]     ??? 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     Priority: High a. 04/14/05: CABG x 5 LIMA-LAD, L Rad-Dx, sSVG-RCA-RCA, sSVG-OM & MV Plasty for MR  with initial trigger for evaluation being murmur and dyspnea  b. 12/13/05 EF 35% by aden thall, Mixed defect in Cx distribution. Referred for ICD   c. Rash w/ Cleda Daub, switch to Inspra  d. 6/07 Removal of all 8 sternal wires and parasternal lipoma d/t persisting pain  E. Jan, 2011 Regaden Simonne Maffucci. Limited inferior Defect, unchanged from 2007. EF 48%  F. 10/08/13: Reg/Thall Stress: EF 48% No ischemia or change from prior studies.    January 15, 2018  Lexiscan sestamibi study;  sinus rhythm, no arrhythmias or EKG changes    EF was 57% with normal wall motion and normal perfusion. [facility]      ??? Atrial Fib Paroxysmal  (HCC) 02/08/2009     Priority: High     a. 7/06 Early post CAB Afib, Brief amio pre dismissal. Post DC AF still rapid, admit [place]  b. 05/23/05 TEE: No LA clot, EF 25% Cvert to SR on Ticosyn 500 BID, dec'd to 250  BID w/             QTC@ , QTc to 470. Home 05/26/05  c. 11/29/05 Ticosyn DC'd, Dr. Hale Bogus p recurrent afib on ticosyn  D. 12/14 NSR on clinic exam, MAC OV (CBP)  E. 05/16/16 ICD interrogation: 6.2% atrial fibrillation burden, on warfarin   F 11/22/2018   Paroxysmal atrial fibrillation RF ablation  Dr Bradly Bienenstock         ??? 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 Gretchen Short  12/17/13 Generator at ERI,  Fidelis RV-ICD lead extraction w/ laser assistance,Implant Medtronic Evera XT ICD and new RV lead. RA lead left i tact. DFTs done. Dr. Naoma Diener     ??? Hyperlipemia 02/11/2009     Priority: Medium     HIghest total cholesterol he recalls pre treatment: 270  a. 7/06 Rx Vytorin 10/40, then to 10/80 approx 04/28/05. Recalls knee pain w/ lovastatin  b. 08/03/05 total 156 trig 118 HDL 39 LDL 95  C. 02/09/09 total 148 trig 114  HDL 43  LDL 81 Vytorin 10/80 Fishoil  >>Starts Lipitor 80 + Zetia March 2011 ??? Sleep apnea 02/11/2009     Priority: Medium     Sleep apnea      a. 2/07 Using CPAP, good tolerance and benefit after Sleep study approx 2003 Dr. Andreas Newport     ??? Hypertension 02/11/2009     Priority: Medium     Hypertension        a. ACE cough on lisinopril, olmesartan 20 well tolerated      ??? 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 7/06 Carotid US mod disease < 50% sten     ??? Visit for monitoring Tikosyn therapy 11/22/2018   ??? Atrial fibrillation (HCC) 11/22/2018   ??? VT (ventricular tachycardia) (HCC) 02/28/2018   ??? 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 Encounter for anticoagulation discussion and counseling 11/28/2014   ??? Constipation due to opioid therapy 09/05/2014   ??? Closed fracture of multiple ribs of left side 08/29/2014   ??? 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 Left arm swelling 04/01/2014   ??? Implantable cardioverter-defibrillator lead failure 10/08/2013     12/17/13 RV lead extraction and reimplantation + ICD generator change           Review of Systems   Constitution: Negative.   HENT: Negative.    Eyes: Negative.    Cardiovascular: Positive for dyspnea on exertion (not more than usual).   Respiratory: Negative.    Endocrine: Negative.    Hematologic/Lymphatic: Negative.    Skin: Negative.    Musculoskeletal: Positive for joint pain (R Knee).   Gastrointestinal: Negative.    Genitourinary: Negative.    Neurological: Negative.    Psychiatric/Behavioral: Negative.    Allergic/Immunologic: Negative.    All other systems reviewed and are negative.  14 organ system review noted. It is negative except as reported in current narrative or above in the ROS section. This is a patient centered review of systems that was stated by the patient in his terms prior to my personal problem oriented interview with the patient     Physical Exam  Appearance:Overweight, but appears otherwise healthy, no distress Skin: Not pale or icteric, skin warm and dry Eyes: Pupils equal and round Thyroid:  not enlarged   Carotids: Normal upstrokes, no bruits Neck Veins: CVP appears slightly greater than 8 with slight V wave and mild HJR  Chest/thorax: Breathing comfortably. Lungs clear to percussion and auscultation. No rales, rhonchi or wheezing   Cardiac: Rhythm regular. S1 and S2 normal with fourth heart sound, no rub or third sound. Gr i/vi apical systolic murmur   Abdomen: soft, non-tender, no masses. Normal bowel sounds. Liver not enlarged. No abdominal bruit, aorta not palpable   Pulses: Femorals: Normal pulses, no bruits Pedals 1+PT pulses, 1-2 mm bilateral ankle and low leg edema.   Leg/ankle edema:minimal biilateral lower leg Neuro/Motor: Normal strength & gross neurologic function all extremities, normal speech & hearing   Neuro/Cognition: Good insight, clear historian, no depression    Cardiovascular Studies  Today's 12 lead EKG: Atrial pacing, intact AV conduction, atrial rate 82    Problems Addressed Today  Encounter Diagnoses   Name Primary?   ??? Atrial Fib Paroxysmal  (HCC)        Assessment and Plan     Mr. Kilcrease  is doing well from a cardiac standpoint.  He does not have mitral regurg by exam.  His LV function was normal last year and does not need to be rechecked.  He has no symptoms suggesting angina or intolerance medications.  I think he has been in sinus on and off checks we have had since his AV node ablation.  His last interrogation I have find records for showed no atrial fib.    I do not think there is any reason for him to be concerned about having the knee surgery at [facility].  I think he is in good shape for virtually any elective procedure.  It I think he could have his procedure done at a surgery center rather than the full service hospital at [facility] depending on what Dr. Dareen Piano thinks would be best from a surgical standpoint.  Not changing any of his medications.  He is on Eliquis for stroke prevention. With minimal A. fib burden on pacer interrogation he can safely interrupt the Eliquis preop to minimize postop bleeding and resume it as early as 1 day postop.    I do not think I need to see him extremely regularly perhaps in 6 months.  If something begins to develop that is a problem such as shortness of breath chest pain or severe swings in blood pressure I can see him on a shorter notice.      NB: The free text in this document was generated through Dragon(TM) software with editing and proofreading  done by the author of this document Dr. Mable Paris MD, [facility] principally at the point of care. Some errors may persist.  If there are questions about content in this document please contact Dr. Hale Bogus.    The written information I provided Mr. Eastep at the conclusion of today's encounter is as  follows:    Patient Instructions   This note is your cardiology clearance for your knee procedure to be done by Dr.Anderson.  You are healthy enough to have this done at a freestanding surgery center or at major hospital whenever Dr. Dareen Piano prefers.  I do not think you are going to have any trouble.  He can stop the Eliquis a few days before surgery if he wishes age you do not have much A. fib and there is minimal risk of having a clotting/stroke problem if you stop the Eliquis.  Resume as soon as you  can afterwards.    Take the spironolactone just once in the morning.  I change your records to reflect that you have been doing this at night at least over the last couple of months.  Until its urination inducing meds so I usually have people take it in the morning unless they like to stay up late and watch movies.    Your blood pressure looks good.  Everything else looks good so I think we can make this 27-month follow-up after this and see how things look then.    Call in if you have problems or questions.   Marissa Nestle, MD              Current Medications (including today's revisions) ??? acetaminophen (TYLENOL) 325 mg tablet Take 2 Tabs by mouth every 4 hours as needed for Pain.   ??? albuterol (VENTOLIN HFA, PROAIR HFA) 90 mcg/actuation inhaler Inhale 2 Puffs by mouth four times daily as needed for Wheezing.   ??? amoxicillin-potassium clavulanate (AUGMENTIN) 875/125 mg tablet Take 1 tablet by mouth every 12 hours. Take with food.   ??? apixaban (ELIQUIS) 5 mg tablet Take one tablet by mouth twice daily.   ??? aspirin 81 mg chewable tablet Take 1 Tab by mouth daily. WAIT to restart until finished taking lovenox injections. (Patient taking differently: Chew 81 mg by mouth at bedtime daily.)   ??? cetirizine (ZYRTEC) 10 mg tablet Take 10 mg by mouth daily.   ??? cholecalciferol (Vitamin D3) (VITAMIN D-3) 1,000 units tablet Take 1,000 Units by mouth twice daily.     ??? dextromethorphan/guaiFENesin (MUCINEX DM) 30/600 mg Tb12 Take 2 tablets by mouth twice daily as needed.   ??? docusate (COLACE) 100 mg capsule Take 100 mg by mouth at bedtime daily.   ??? dofetilide (TIKOSYN) 500 mcg capsule Take one capsule by mouth twice daily.   ??? ezetimibe (ZETIA) 10 mg tablet Take one tablet by mouth at bedtime daily.   ??? famotidine (PEPCID) 40 mg tablet Take 40 mg by mouth at bedtime daily.   ??? fexofenadine(+) (ALLEGRA) 180 mg tablet Take 180 mg by mouth daily.   ??? fish oil /omega-3 fatty acids (SEA-OMEGA) 340/1000 mg Cap Take 2 capsules by mouth every morning, then 3 capsules every evening   ??? fluticasone-umeclidin-vilanter (TRELEGY ELLIPTA) 100-62.5-25 mcg dsdv Inhale 1 Dose by mouth into the lungs at bedtime daily.   ??? furosemide (LASIX) 40 mg tablet TAKE 1 TO 2 TABLETS ONCE   DAILY AS DIRECTED BY       CARDIOLOGY   ??? gabapentin (NEURONTIN) 300 mg capsule Take 300 mg by mouth three times daily.   ??? glucosamine su 2KCl-chondroit 500-400 mg tab Take 1 Tab by mouth twice daily.   ??? ipratropium bromide (ATROVENT) 42 mcg (0.06 %) nasal spray Apply 2 sprays to each nostril as directed twice daily as needed. ??? ketoconazole (NIZORAL) 2 % topical cream Apply  topically to affected area daily as needed.   ??? losartan (COZAAR) 50 mg tablet TAKE 1 TABLET DAILY (Patient taking differently: Take 50 mg by mouth at bedtime daily.)   ??? metoprolol XL (TOPROL XL) 100 mg extended release tablet Take one tablet by mouth daily.   ??? mucus clearing device (AEROBIKA OSCILLATING PEP SYSTM MISC) Use  as directed. Use with salt water and inhale twice daily   ??? MYRBETRIQ 50 mg tablet Take 1 tablet by mouth daily.   ??? other medication mupirocin ointment and budesonide solution mixed with warm water  Use  as nasal irrigation twice daily   ??? pantoprazole DR (PROTONIX) 40 mg tablet Take 40 mg by mouth twice daily.   ??? polyethylene glycol 3350 (GLYCOLAX; MIRALAX) 17 gram/dose powder Take 17 g by mouth daily. (Patient taking differently: Take 17 g by mouth nightly as needed.)   ??? rosuvastatin (CRESTOR) 40 mg tablet Take one tablet by mouth daily. (Patient taking differently: Take 40 mg by mouth at bedtime daily.)   ??? sildenafil(+) (VIAGRA) 100 mg tablet Take 1 Tab by mouth as Needed for Erectile dysfunction.   ??? spironolactone (ALDACTONE) 25 mg tablet TAKE 1 TABLET TWICE A DAY   ??? tamsulosin (FLOMAX) 0.4 mg capsule Take 0.4 mg by mouth at bedtime daily. Do not crush, chew or open capsules. Take 30 minutes following the same meal each day.    ??? terbinafine (LAMISIL) 250 mg tablet Take 250 mg by mouth daily.   ??? testosterone cypionate 200 mg/mL kit Inject 1 mL into the muscle every 14 days.

## 2019-03-04 NOTE — Patient Instructions
This note is your "cardiology clearance" for your knee procedure to be done by Dr.Anderson.  You are healthy enough to have this done at a freestanding surgery center or at major hospital whenever Dr. Ouida Sills prefers.  I do not think you are going to have any trouble.  He can stop the Eliquis a few days before surgery if he wishes age you do not have much A. fib and there is minimal risk of having a clotting/stroke problem if you stop the Eliquis.  Resume as soon as you can afterwards.    Take the spironolactone just once in the morning.  I change your records to reflect that you have been doing this at night at least over the last couple of months.  Until its urination inducing meds so I usually have people take it in the morning unless they like to stay up late and watch movies.    Your blood pressure looks good.  Everything else looks good so I think we can make this 45-month follow-up after this and see how things look then.    Call in if you have problems or questions.   Lenoard Aden, MD

## 2019-03-05 DIAGNOSIS — R9431 Abnormal electrocardiogram [ECG] [EKG]: Secondary | ICD-10-CM

## 2019-03-05 DIAGNOSIS — I340 Nonrheumatic mitral (valve) insufficiency: Secondary | ICD-10-CM

## 2019-03-05 DIAGNOSIS — I480 Paroxysmal atrial fibrillation: Secondary | ICD-10-CM

## 2019-03-05 DIAGNOSIS — Z7901 Long term (current) use of anticoagulants: Secondary | ICD-10-CM

## 2019-03-18 ENCOUNTER — Encounter: Admit: 2019-03-18 | Discharge: 2019-03-18

## 2019-03-18 ENCOUNTER — Ambulatory Visit: Admit: 2019-03-18 | Discharge: 2019-03-18

## 2019-03-18 DIAGNOSIS — Z95810 Presence of automatic (implantable) cardiac defibrillator: Secondary | ICD-10-CM

## 2019-03-18 DIAGNOSIS — I255 Ischemic cardiomyopathy: Secondary | ICD-10-CM

## 2019-03-27 ENCOUNTER — Encounter: Admit: 2019-03-27 | Discharge: 2019-03-27

## 2019-03-27 ENCOUNTER — Ambulatory Visit: Admit: 2019-03-27 | Discharge: 2019-03-27

## 2019-03-27 DIAGNOSIS — I480 Paroxysmal atrial fibrillation: Secondary | ICD-10-CM

## 2019-03-27 DIAGNOSIS — Z79899 Other long term (current) drug therapy: Secondary | ICD-10-CM

## 2019-03-27 DIAGNOSIS — I10 Essential (primary) hypertension: Secondary | ICD-10-CM

## 2019-03-27 DIAGNOSIS — R7303 Prediabetes: Secondary | ICD-10-CM

## 2019-03-27 DIAGNOSIS — I340 Nonrheumatic mitral (valve) insufficiency: Secondary | ICD-10-CM

## 2019-03-27 DIAGNOSIS — I679 Cerebrovascular disease, unspecified: Secondary | ICD-10-CM

## 2019-03-27 DIAGNOSIS — T148XXA Other injury of unspecified body region, initial encounter: Secondary | ICD-10-CM

## 2019-03-27 DIAGNOSIS — J302 Other seasonal allergic rhinitis: Secondary | ICD-10-CM

## 2019-03-27 DIAGNOSIS — K449 Diaphragmatic hernia without obstruction or gangrene: Secondary | ICD-10-CM

## 2019-03-27 DIAGNOSIS — Z95810 Presence of automatic (implantable) cardiac defibrillator: Secondary | ICD-10-CM

## 2019-03-27 DIAGNOSIS — G5600 Carpal tunnel syndrome, unspecified upper limb: Secondary | ICD-10-CM

## 2019-03-27 DIAGNOSIS — I255 Ischemic cardiomyopathy: Secondary | ICD-10-CM

## 2019-03-27 DIAGNOSIS — Z5181 Encounter for therapeutic drug level monitoring: Secondary | ICD-10-CM

## 2019-03-27 DIAGNOSIS — R05 Cough: Secondary | ICD-10-CM

## 2019-03-27 DIAGNOSIS — I472 Ventricular tachycardia: Secondary | ICD-10-CM

## 2019-03-27 DIAGNOSIS — Z7901 Long term (current) use of anticoagulants: Secondary | ICD-10-CM

## 2019-03-27 DIAGNOSIS — J449 Chronic obstructive pulmonary disease, unspecified: Secondary | ICD-10-CM

## 2019-03-27 DIAGNOSIS — E7800 Pure hypercholesterolemia, unspecified: Secondary | ICD-10-CM

## 2019-03-27 DIAGNOSIS — M1990 Unspecified osteoarthritis, unspecified site: Secondary | ICD-10-CM

## 2019-03-27 DIAGNOSIS — M549 Dorsalgia, unspecified: Secondary | ICD-10-CM

## 2019-03-27 DIAGNOSIS — E669 Obesity, unspecified: Secondary | ICD-10-CM

## 2019-03-27 DIAGNOSIS — I509 Heart failure, unspecified: Secondary | ICD-10-CM

## 2019-03-27 DIAGNOSIS — G4733 Obstructive sleep apnea (adult) (pediatric): Secondary | ICD-10-CM

## 2019-03-27 DIAGNOSIS — K219 Gastro-esophageal reflux disease without esophagitis: Secondary | ICD-10-CM

## 2019-03-27 DIAGNOSIS — I2510 Atherosclerotic heart disease of native coronary artery without angina pectoris: Secondary | ICD-10-CM

## 2019-03-27 DIAGNOSIS — I219 Acute myocardial infarction, unspecified: Secondary | ICD-10-CM

## 2019-03-27 DIAGNOSIS — E785 Hyperlipidemia, unspecified: Secondary | ICD-10-CM

## 2019-03-27 DIAGNOSIS — I4891 Unspecified atrial fibrillation: Secondary | ICD-10-CM

## 2019-03-27 NOTE — Progress Notes
Date of Service: 03/27/2019    Johnny [name] is a 73 y.o. male.       HPI     Johnny [name] was seen in the office today in electrophysiology follow up. As you may know, he is a 73 y.o. 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  He is followed by Dr. Bradly Bienenstock and Dr. Hale Bogus.    He is here today for his 46-month follow-up post ablation.  He tells me he is doing well.  He did not know when he was in atrial fibrillation, however acknowledges he may feel a little bit better overall since ablation.  Post ablation plan was to continue Tikosyn for 3 months and stop on June 5.  He denies recent fever, chills, cold/flu, chest pain, shortness of breath, palpitations, weakness, lightheadedness, near-syncope or syncope.     He is having knee surgery on 04/03/2019.  He saw Dr. Hale Bogus on 03/04/2019 for preoperative clearance.  Because of his low atrial fibrillation burden, it was deemed he would be okay to stop Eliquis prior to procedure and resume as soon as possible post procedure.  He is interested in obtaining leg pumps (SCDs) for home use.         Vitals:    03/27/19 1509   BP: 104/66   BP Source: Arm, Left Upper   Pulse: 80   SpO2: 96%   Weight: 113.7 kg (250 lb 9.6 oz)   Height: 1.854 m (6' 1)   PainSc: Four     Body mass index is 33.06 kg/m???.     Past Medical History  Patient Active Problem List    Diagnosis Date Noted   ??? Visit for monitoring Tikosyn therapy 11/22/2018   ??? Atrial fibrillation (HCC) 11/22/2018   ??? VT (ventricular tachycardia) (HCC) 02/28/2018   ??? S/P R total knee arthroplasty 12/08/2014 ??? Encounter for anticoagulation discussion and counseling 11/28/2014   ??? Constipation due to opioid therapy 09/05/2014   ??? Closed fracture of multiple ribs of left side 08/29/2014   ??? 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 Left arm swelling 04/01/2014   ??? Hyperlipemia 02/11/2009     HIghest total cholesterol he recalls pre treatment: 270  a. 7/06 Rx Vytorin 10/40, then to 10/80 approx 04/28/05. Recalls knee pain w/ lovastatin  b. 08/03/05 total 156 trig 118 HDL 39 LDL 95  C. 02/09/09 total 148 trig 114  HDL 43  LDL 81 Vytorin 10/80 Fishoil  >>Starts Lipitor 80 + Zetia March 2011     ??? Sleep apnea 02/11/2009     Sleep apnea      a. 2/07 Using CPAP, good tolerance and benefit after Sleep study approx 2003 Dr. Andreas Newport     ??? Hypertension 02/11/2009     Hypertension        a. ACE cough on lisinopril, olmesartan 20 well tolerated      ??? Cerebrovascular disease 02/11/2009     Cerebrovascular disease       a. 7/06 Carotid US mod disease < 50% sten     ??? 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  04/14/05 #28 [place] ring, Resect P2 Seg Post leaflet,ring.as above w/ CABG-[place]     ???  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 04/14/05: CABG x 5 LIMA-LAD, L Rad-Dx, sSVG-RCA-RCA, sSVG-OM & MV Plasty for MR  with initial trigger for evaluation being murmur and dyspnea  b. 12/13/05 EF 35% by aden thall, Mixed defect in Cx distribution. Referred for ICD   c. Rash w/ Cleda Daub, switch to Inspra  d. 6/07 Removal of all 8 sternal wires and parasternal lipoma d/t persisting pain  E. Jan, 2011 Regaden Simonne Maffucci. Limited inferior Defect, unchanged from 2007. EF 48%  F. 10/08/13: Reg/Thall Stress: EF 48% No ischemia or change from prior studies.    January 15, 2018  Lexiscan sestamibi study;  sinus rhythm, no arrhythmias or EKG changes    EF was 57% with normal wall motion and normal perfusion. [employer]      ??? Atrial Fib Paroxysmal  (HCC) 02/08/2009     a. 7/06 Early post CAB Afib, Brief amio pre dismissal. Post DC AF still rapid, admit [place]  b. 05/23/05 TEE: No LA clot, EF 25% Cvert to SR on Ticosyn 500 BID, dec'd to 250 BID w/             QTC@ , QTc to 470. Home 05/26/05  c. 11/29/05 Ticosyn DC'd, Dr. Hale Bogus p recurrent afib on ticosyn  D. 12/14 NSR on clinic exam, MAC OV (CBP)  E. 05/16/16 ICD interrogation: 6.2% atrial fibrillation burden, on warfarin   F 11/22/2018   Paroxysmal atrial fibrillation RF ablation  Dr Bradly Bienenstock         ??? 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 Gretchen Short  12/17/13 Generator at [facility],  Fidelis RV-ICD lead extraction w/ laser assistance,Implant Medtronic Evera XT ICD and new RV lead. RA lead left i tact. DFTs done. Dr. Naoma Diener           Review of Systems   Constitution: Negative.   HENT: Negative.    Eyes: Negative.    Cardiovascular: Positive for leg swelling.        Swells a little bit.    Respiratory: Negative.    Endocrine: Negative.    Hematologic/Lymphatic: Negative.    Skin: Negative.    Musculoskeletal: Negative.    Gastrointestinal: Negative.    Genitourinary: Negative.    Neurological: Negative.    Psychiatric/Behavioral: Negative.    Allergic/Immunologic: Negative.        Physical Exam   Constitutional: He appears well-developed and well-nourished.   HENT:   Head: Normocephalic and atraumatic.   Eyes: EOM are normal.   Neck: Normal range of motion. Neck supple.   Cardiovascular: Normal rate, regular rhythm and normal heart sounds.   Pulmonary/Chest: Effort normal and breath sounds normal.   Abdominal:   Obese   Musculoskeletal: Normal range of motion.         General: Edema present.      Comments: Bilateral lower extremity edema.  Wearing double layer compression stockings.   Neurological: He is alert and oriented to person, place, and time.   Skin: Skin is warm and dry.   Psychiatric: He has a normal mood and affect. His behavior is normal. Judgment and thought content normal. Cardiovascular Studies  EKG shows normal sinus rhythm at 80 bpm.    Medtronic dual-chamber ICD shows normal device function.  Battery life 10.1 years.  Presenting rhythm AS-VS ~ 88bpm.  Since 11/23/2018 he has had 6 treated AT/AF events.  Last on 02/26/2019 lasting 2.5 minutes.  All others in March, longest 5.5 minutes on 12/12/2018.  EGM show A. fib/a flutter with average V rate 70s to 90s.  Less than 0.1% burden.  He has had no ventricular events.  Programming changes: Clock updated.    Problems Addressed Today  Encounter Diagnoses   Name Primary?   ??? Pure hypercholesterolemia Yes   ??? Essential hypertension    ??? Cerebrovascular disease    ??? Ischemic cardiomyopathy    ??? Atrial Fib Paroxysmal  (HCC)    ??? Nonrheumatic mitral valve regurgitation    ??? VT (ventricular tachycardia) (HCC)    ??? Paroxysmal atrial fibrillation (HCC)    ??? Automatic implantable cardioverter-defibrillator in situ    ??? Visit for monitoring Tikosyn therapy        Assessment and Plan     In conclusion, Johnny [name] is doing very well post ablation.  He stopped his Tikosyn on June 5.  He is in normal sinus rhythm today.  He was unaware whenever he was in atrial fibrillation, however confirms that he may feel little bit better since having his ablation.  Because of an elevated CHA2DS2-VASc score, he will continue anticoagulation.  He is having knee surgery in the middle of next week.  Because of his low atrial burden, he will stop his anticoagulation after his dose Saturday evening and resume as soon as possible post surgery when deemed safe by his surgeon.    In regard to his anticoagulation.  He states that affordability of Eliquis is becoming a financial burden.  Evidently he qualifies to be part of a program through [facility] where he could get either Pradaxa or possibly Xarelto at a lesser price.  If he can get Xarelto, that would be my recommendation as Pradaxa has a history of GI side effects.  He also has a friend who had bleeding issues on Pradaxa.  I reassured him that just because his friend had bleeding issues does not guarantee that he would incur the same issues.  Recommended he could make the switch when he finishes up his current supply of Eliquis.    Follow-up with Dr. Bradly Bienenstock in 3 months.    It was a pleasure seeing Johnny [name] in follow-up today.  Thank you for allowing me to participate in the care of this patient.    Johnny Hubert APRN             Current Medications (including today's revisions)  ??? acetaminophen (TYLENOL) 325 mg tablet Take 2 Tabs by mouth every 4 hours as needed for Pain.   ??? albuterol (VENTOLIN HFA, PROAIR HFA) 90 mcg/actuation inhaler Inhale 2 Puffs by mouth four times daily as needed for Wheezing.   ??? amoxicillin-potassium clavulanate (AUGMENTIN) 875/125 mg tablet Take 1 tablet by mouth every 12 hours. Take with food.   ??? apixaban (ELIQUIS) 5 mg tablet Take one tablet by mouth twice daily.   ??? aspirin 81 mg chewable tablet Take 1 Tab by mouth daily. WAIT to restart until finished taking lovenox injections. (Patient taking differently: Chew 81 mg by mouth at bedtime daily.)   ??? cetirizine (ZYRTEC) 10 mg tablet Take 10 mg by mouth daily.   ??? cholecalciferol (Vitamin D3) (VITAMIN D-3) 1,000 units tablet Take 1,000 Units by mouth twice daily.     ??? dextromethorphan/guaiFENesin (MUCINEX DM) 30/600 mg Tb12 Take 2 tablets by mouth daily as needed.   ??? docusate (COLACE) 100 mg capsule Take 200 mg by mouth at bedtime daily. Pt is taking 550 mg  daily   ??? dofetilide (TIKOSYN) 500 mcg capsule Take one capsule by mouth twice daily.   ??? ezetimibe (ZETIA) 10 mg tablet Take one tablet by mouth at bedtime daily.   ??? famotidine (PEPCID) 40 mg tablet Take 40 mg by mouth at bedtime daily.   ??? fexofenadine(+) (ALLEGRA) 180 mg tablet Take 180 mg by mouth daily.   ??? fish oil /omega-3 fatty acids (SEA-OMEGA) 340/1000 mg Cap Take 2 capsules by mouth every morning, then 3 capsules every evening ??? fluticasone-umeclidin-vilanter (TRELEGY ELLIPTA) 100-62.5-25 mcg dsdv Inhale 1 Dose by mouth into the lungs at bedtime daily.   ??? furosemide (LASIX) 40 mg tablet TAKE 1 TO 2 TABLETS ONCE   DAILY AS DIRECTED BY       CARDIOLOGY   ??? gabapentin (NEURONTIN) 300 mg capsule Take 300 mg by mouth every 6 hours.   ??? glucosamine su 2KCl-chondroit 500-400 mg tab Take 1 Tab by mouth twice daily.   ??? ipratropium bromide (ATROVENT) 42 mcg (0.06 %) nasal spray Apply 2 sprays to each nostril as directed twice daily as needed.   ??? ketoconazole (NIZORAL) 2 % topical cream Apply  topically to affected area daily as needed.   ??? losartan (COZAAR) 50 mg tablet TAKE 1 TABLET DAILY (Patient taking differently: Take 50 mg by mouth at bedtime daily.)   ??? metoprolol XL (TOPROL XL) 100 mg extended release tablet Take one tablet by mouth daily.   ??? mucus clearing device (AEROBIKA OSCILLATING PEP SYSTM MISC) Use  as directed. Use with salt water and inhale twice daily   ??? MYRBETRIQ 50 mg tablet Take 1 tablet by mouth daily.   ??? other medication mupirocin ointment and budesonide solution mixed with warm water  Use as nasal irrigation twice daily   ??? pantoprazole DR (PROTONIX) 40 mg tablet Take 40 mg by mouth twice daily.   ??? polyethylene glycol 3350 (GLYCOLAX; MIRALAX) 17 gram/dose powder Take 17 g by mouth daily. (Patient taking differently: Take 17 g by mouth nightly as needed.)   ??? rosuvastatin (CRESTOR) 40 mg tablet Take one tablet by mouth daily. (Patient taking differently: Take 40 mg by mouth at bedtime daily.)   ??? sildenafil(+) (VIAGRA) 100 mg tablet Take 1 Tab by mouth as Needed for Erectile dysfunction.   ??? spironolactone (ALDACTONE) 25 mg tablet Take one tablet by mouth daily. Take with food.   ??? tamsulosin (FLOMAX) 0.4 mg capsule Take 0.4 mg by mouth at bedtime daily. Do not crush, chew or open capsules. Take 30 minutes following the same meal each day.    ??? terbinafine (LAMISIL) 250 mg tablet Take 250 mg by mouth daily. ??? testosterone cypionate 200 mg/mL kit Inject 1 mL into the muscle every 14 days.

## 2019-03-27 NOTE — Patient Instructions
-   we are ok with you transitioning to Pradaxa or Xarelto   - update [place] with your desired medication - Nurse Voicemail [phone]    - Sequential Compression Devices (SCDs) - as your surgeon about ordering these    - SCHEDULE a follow-up appointment with Dr. Cleatrice Burke in 3 months    In order to provide you the best care possible we ask that you follow up as below:    For NON-URGENT questions please contact us through your MyChart account.   For all medication refills please contact your pharmacy or send a request through [facility].   For all questions that may need to be addressed urgently please call the nursing triage line at [phone] Monday - Friday 8-5 only. Please leave a detailed message with your name, date of birth, and reason for your call.    To schedule an appointment call [phone].     Please allow 10-15 business days for the results of any testing to be reviewed. Please call our office if you have not heard from a nurse within this time frame.

## 2019-03-29 ENCOUNTER — Encounter: Admit: 2019-03-29 | Discharge: 2019-03-29

## 2019-03-29 MED ORDER — RIVAROXABAN 20 MG PO TAB
20 mg | Freq: Once | ORAL | 0 refills | Status: AC
Start: 2019-03-29 — End: ?

## 2019-04-04 ENCOUNTER — Encounter: Admit: 2019-04-04 | Discharge: 2019-04-04

## 2019-04-04 DIAGNOSIS — I480 Paroxysmal atrial fibrillation: Secondary | ICD-10-CM

## 2019-04-08 ENCOUNTER — Encounter: Admit: 2019-04-08 | Discharge: 2019-04-08

## 2019-04-08 MED ORDER — APIXABAN 5 MG PO TAB
5 mg | ORAL_TABLET | Freq: Two times a day (BID) | ORAL | 3 refills | Status: DC
Start: 2019-04-08 — End: 2019-07-09

## 2019-04-08 NOTE — Telephone Encounter
Phoned pt to confirm he is taking Eliquis, he is and will plan to switch in about 24 days.     Eliquis is not on the patient's medication list. He is not switching for a few weeks, and he is not certain of what he will be switching to as it will depend on which vendor provides the biggest discount, but likely Xarelto.   IB sent the RN team to ensure he remains on [facility], and to please document which one once, he makes the switch.     IB sent to Dr. Lindwood Qua team to follow up.

## 2019-05-08 ENCOUNTER — Encounter: Admit: 2019-05-08 | Discharge: 2019-05-08

## 2019-05-08 MED ORDER — EZETIMIBE 10 MG PO TAB
ORAL_TABLET | Freq: Every evening | 3 refills | Status: DC
Start: 2019-05-08 — End: 2019-09-25

## 2019-07-10 ENCOUNTER — Encounter: Admit: 2019-07-10 | Discharge: 2019-07-10 | Payer: MEDICARE

## 2019-07-10 ENCOUNTER — Ambulatory Visit: Admit: 2019-07-10 | Discharge: 2019-07-10 | Payer: MEDICARE

## 2019-07-10 DIAGNOSIS — I4891 Unspecified atrial fibrillation: Secondary | ICD-10-CM

## 2019-07-10 DIAGNOSIS — I480 Paroxysmal atrial fibrillation: Secondary | ICD-10-CM

## 2019-07-10 DIAGNOSIS — I255 Ischemic cardiomyopathy: Secondary | ICD-10-CM

## 2019-07-10 DIAGNOSIS — R7303 Prediabetes: Secondary | ICD-10-CM

## 2019-07-10 DIAGNOSIS — I340 Nonrheumatic mitral (valve) insufficiency: Secondary | ICD-10-CM

## 2019-07-10 DIAGNOSIS — M1990 Unspecified osteoarthritis, unspecified site: Secondary | ICD-10-CM

## 2019-07-10 DIAGNOSIS — Z95810 Presence of automatic (implantable) cardiac defibrillator: Secondary | ICD-10-CM

## 2019-07-10 DIAGNOSIS — I10 Essential (primary) hypertension: Secondary | ICD-10-CM

## 2019-07-10 DIAGNOSIS — T148XXA Other injury of unspecified body region, initial encounter: Secondary | ICD-10-CM

## 2019-07-10 DIAGNOSIS — G4733 Obstructive sleep apnea (adult) (pediatric): Secondary | ICD-10-CM

## 2019-07-10 DIAGNOSIS — E785 Hyperlipidemia, unspecified: Secondary | ICD-10-CM

## 2019-07-10 DIAGNOSIS — G5600 Carpal tunnel syndrome, unspecified upper limb: Secondary | ICD-10-CM

## 2019-07-10 DIAGNOSIS — K449 Diaphragmatic hernia without obstruction or gangrene: Secondary | ICD-10-CM

## 2019-07-10 DIAGNOSIS — J449 Chronic obstructive pulmonary disease, unspecified: Secondary | ICD-10-CM

## 2019-07-10 DIAGNOSIS — K219 Gastro-esophageal reflux disease without esophagitis: Secondary | ICD-10-CM

## 2019-07-10 DIAGNOSIS — J302 Other seasonal allergic rhinitis: Secondary | ICD-10-CM

## 2019-07-10 DIAGNOSIS — I219 Acute myocardial infarction, unspecified: Secondary | ICD-10-CM

## 2019-07-10 DIAGNOSIS — M549 Dorsalgia, unspecified: Secondary | ICD-10-CM

## 2019-07-10 DIAGNOSIS — I2510 Atherosclerotic heart disease of native coronary artery without angina pectoris: Secondary | ICD-10-CM

## 2019-07-10 DIAGNOSIS — R05 Cough: Secondary | ICD-10-CM

## 2019-07-10 DIAGNOSIS — I509 Heart failure, unspecified: Secondary | ICD-10-CM

## 2019-07-10 DIAGNOSIS — E669 Obesity, unspecified: Secondary | ICD-10-CM

## 2019-07-10 LAB — MAGNESIUM: Lab: 1.9 mg/dL (ref 1.6–2.6)

## 2019-07-10 LAB — BASIC METABOLIC PANEL
Lab: 1.1 mg/dL (ref 0.4–1.24)
Lab: 136 mg/dL — ABNORMAL HIGH (ref 70–100)
Lab: 141 MMOL/L (ref 137–147)
Lab: 21 mg/dL (ref 7–25)
Lab: 29 MMOL/L (ref 21–30)
Lab: 4.1 MMOL/L (ref 3.5–5.1)
Lab: 9.2 mg/dL (ref 8.5–10.6)
Lab: >60 mL/min (ref >60)
Lab: >60 mL/min (ref >60)
Lab: 9 (ref 3–12)

## 2019-07-10 NOTE — Assessment & Plan Note
He has been doing well without any complaints of chest discomfort, shortness of breath, dizziness, or syncope.  He denies palpitations.  His device interrogation today shows no atrial arrhythmias.  He will continue Xarelto for an elevated CHA2DS2-VASc score

## 2019-07-10 NOTE — Patient Instructions
please schedule an appointment with Dr. Pimentel in 6 months .  To schedule an appointment call [phone].     In order to provide you the best care possible we ask that you follow up as below:    For non-urgent questions please contact us through your MyChart account.   For all medication refills please contact your pharmacy or send a request through MyChart.     For all questions that may need to be addressed urgently please call the nursing triage voicemail at [phone] Monday - Friday 8-5 only. Please leave a detailed message with your name, date of birth, and reason for your call.      Please allow ~ 10 business days for the results of any testing to be reviewed. Please call our office if you have not heard from a nurse within this time frame.

## 2019-07-10 NOTE — Progress Notes
Normal or within an acceptable range.

## 2019-07-10 NOTE — Assessment & Plan Note
His device interrogation today shows an abnormal OptiVol index.  Johnny [name] admits to eating more fast food recently.  However, he denies shortness of breath or lower extremity edema.  I have asked him to try and decrease his salt intake.  He will contact us if he develops symptoms of heart failure.

## 2019-07-10 NOTE — Assessment & Plan Note
Device interrogation today shows stable sensing and pacing thresholds.  He has had no atrial fibrillation.  His OptiVol index is up and Johnny [name] confesses that he might be eating a little more in the way of takeout food.  I encouraged him to try and decrease his sodium intake.

## 2019-07-10 NOTE — Progress Notes
Date of Service: 07/10/2019    Johnny [name] is a 73 y.o. male.       HPI     Johnny [name] presents to my office today in electrophysiology follow-up for a history of atrial arrhythmias and defibrillator.  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 fib ablation who was last seen by telehealth by me in March.  At that time, the patient was feeling well without any significant complaints.  He was one month s/p successful RF ablation of his atrial fibrillation and was restarted on tikosyn.  We made plans to see each other back in follow-up in 4 months.    Johnny [name] returns to my office today telling me that he has been feeling pretty good.  He continues to deny any palpitations.  He has had no chest pain, shortness of breath, dizziness, or syncope.  His energy level is pretty good.  He is no longer on tikosyn.    Device interrogation today shows stable sensing and pacing thresholds.  He has had no atrial fibrillation.  He has had an elevation he has had elevated OptiVol impedances.         Vitals:    07/10/19 1148   BP: 120/66   BP Source: Arm, Right Upper   Pulse: 78   SpO2: 98%   Weight: 111.6 kg (246 lb)   Height: 1.854 m (6' 1)   PainSc: Zero     Body mass index is 32.46 kg/m?Marland Kitchen     Past Medical History  Patient Active Problem List    Diagnosis Date Noted   ? Visit for monitoring Tikosyn therapy 11/22/2018   ? Atrial fibrillation (HCC) 11/22/2018   ? VT (ventricular tachycardia) (HCC) 02/28/2018   ? 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 Encounter for anticoagulation discussion and counseling 11/28/2014   ? Constipation due to opioid therapy 09/05/2014   ? Closed fracture of multiple ribs of left side 08/29/2014   ? 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 Left arm swelling 04/01/2014   ? Hyperlipemia 02/11/2009     HIghest total cholesterol he recalls pre treatment: 270  a. 7/06 Rx Vytorin 10/40, then to 10/80 approx 04/28/05. Recalls knee pain w/ lovastatin  b. 08/03/05 total 156 trig 118 HDL 39 LDL 95  C. 02/09/09 total 148 trig 114  HDL 43  LDL 81 Vytorin 10/80 Fishoil  >>Starts Lipitor 80 + Zetia March 2011     ? Sleep apnea 02/11/2009     Sleep apnea      a. 2/07 Using CPAP, good tolerance and benefit after Sleep study approx 2003 Dr. Andreas Newport     ? Hypertension 02/11/2009     Hypertension        a. ACE cough on lisinopril, olmesartan 20 well tolerated      ? Cerebrovascular disease 02/11/2009     Cerebrovascular disease       a. 7/06 Carotid US mod disease < 50% sten     ? 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  04/14/05 #28 [place] ring, Resect P2 Seg Post leaflet,ring.as above w/ CABG-[place]     ? 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 04/14/05: CABG x 5 LIMA-LAD, L Rad-Dx, sSVG-RCA-RCA, sSVG-OM & MV Plasty for MR  with initial trigger for evaluation being murmur and dyspnea  b. 12/13/05 EF 35% by aden thall, Mixed defect in Cx distribution. Referred for ICD   c. Rash w/ Cleda Daub, switch to Inspra  d. 6/07 Removal of all 8 sternal wires and parasternal lipoma d/t persisting pain  E. Jan, 2011 Regaden Simonne Maffucci. Limited inferior Defect, unchanged from 2007. EF 48%  F. 10/08/13: Reg/Thall Stress: EF 48% No ischemia or change from prior studies.    January 15, 2018  Lexiscan sestamibi study;  sinus rhythm, no arrhythmias or EKG changes    EF was 57% with normal wall motion and normal perfusion. [employer]      ? Atrial Fib Paroxysmal  (HCC) 02/08/2009     a. 7/06 Early post CAB Afib, Brief amio pre dismissal. Post DC AF still rapid, admit [place]  b. 05/23/05 TEE: No LA clot, EF 25% Cvert to SR on Ticosyn 500 BID, dec'd to 250 BID w/             QTC@ , QTc to 470. Home 05/26/05  c. 11/29/05 Ticosyn DC'd, Dr. Hale Bogus p recurrent afib on ticosyn D. 12/14 NSR on clinic exam, MAC OV (CBP)  E. 05/16/16 ICD interrogation: 6.2% atrial fibrillation burden, on warfarin   F 11/22/2018   Paroxysmal atrial fibrillation RF ablation  Dr Bradly Bienenstock         ? 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 Gretchen Short  12/17/13 Generator at ERI,  Fidelis RV-ICD lead extraction w/ laser assistance,Implant Medtronic Evera XT ICD and new RV lead. RA lead left i tact. DFTs done. Dr. Naoma Diener           Review of Systems   Constitution: Positive for weight loss.   HENT: Negative.    Eyes: Negative.    Cardiovascular: Negative.    Respiratory: Negative.    Endocrine: Negative.    Hematologic/Lymphatic: Negative.    Skin: Negative.    Musculoskeletal: Negative.    Gastrointestinal: Negative.    Genitourinary: Negative.    Neurological: Negative.    Psychiatric/Behavioral: Negative.    Allergic/Immunologic: Negative.        Physical Exam  GEN: obese, well nourished, in no acute distress  HEENT: unremarkable, no JVD  CHEST: clear to auscultation bilaterally  CV: Reg rhythm, nml rate; nml S1 & S2, no S3 or S4; no rub; no murmurs  ABD: soft, nontender, non-distended, positive bowel sounds  EXT: no clubbing, cyanosis, or edema, 1+ distal pulses  NEURO: alert and oriented x3, no focal deficits      Cardiovascular Studies  12 lead EKG:  Sinus rhythm, ventricular rate 78 bpm, QTc 450 msec, incomplete RBBB, QRSd 114 msec      Problems Addressed Today  Encounter Diagnoses   Name Primary?   ? Atrial Fib Paroxysmal  (HCC) Yes   ? Automatic implantable cardioverter-defibrillator in situ    ? Ischemic cardiomyopathy        Assessment and Plan       Atrial Fib Paroxysmal  (HCC)  He has been doing well without any complaints of chest discomfort, shortness of breath, dizziness, or syncope.  He denies palpitations.  His device interrogation today shows no atrial arrhythmias.  He will continue Xarelto for an elevated CHA2DS2-VASc score (5).    Automatic implantable cardioverter-defibrillator in situ  Device interrogation today shows stable sensing and pacing thresholds.  He has had no atrial  fibrillation.  His OptiVol index is up and Johnny [name] confesses that he might be eating a little more in the way of takeout food.  I encouraged him to try and decrease his sodium intake.    Ischemic cardiomyopathy  His device interrogation today shows an abnormal OptiVol index.  Johnny [name] admits to eating more fast food recently.  However, he denies shortness of breath or lower extremity edema.  I have asked him to try and decrease his salt intake.  He will contact us if he develops symptoms of heart failure.    I have asked the patient to see me back in follow up in 6 months.    Current Medications (including today's revisions)  ? acetaminophen (TYLENOL) 325 mg tablet Take 2 Tabs by mouth every 4 hours as needed for Pain.   ? albuterol (VENTOLIN HFA, PROAIR HFA) 90 mcg/actuation inhaler Inhale 2 Puffs by mouth four times daily as needed for Wheezing.   ? amoxicillin-potassium clavulanate (AUGMENTIN) 875/125 mg tablet Take 1 tablet by mouth every 12 hours. Take with food.   ? aspirin 81 mg chewable tablet Take 1 Tab by mouth daily. WAIT to restart until finished taking lovenox injections. (Patient taking differently: Chew 81 mg by mouth at bedtime daily.)   ? cetirizine (ZYRTEC) 10 mg tablet Take 10 mg by mouth daily.   ? cholecalciferol (Vitamin D3) (VITAMIN D-3) 1,000 units tablet Take 1,000 Units by mouth twice daily.     ? dextromethorphan/guaiFENesin (MUCINEX DM) 30/600 mg Tb12 Take 2 tablets by mouth daily as needed.   ? docusate (COLACE) 100 mg capsule Take 200 mg by mouth at bedtime daily. Pt is taking 550 mg daily   ? ezetimibe (ZETIA) 10 mg tablet TAKE 1 TABLET AT BEDTIME   ? famotidine (PEPCID) 40 mg tablet Take 40 mg by mouth at bedtime daily. ? fexofenadine(+) (ALLEGRA) 180 mg tablet Take 180 mg by mouth daily.   ? fish oil /omega-3 fatty acids (SEA-OMEGA) 340/1000 mg Cap Take 2 capsules by mouth every morning, then 3 capsules every evening   ? fluticasone-umeclidin-vilanter (TRELEGY ELLIPTA) 100-62.5-25 mcg dsdv Inhale 1 Dose by mouth into the lungs at bedtime daily.   ? furosemide (LASIX) 40 mg tablet TAKE 1 TO 2 TABLETS ONCE   DAILY AS DIRECTED BY       CARDIOLOGY   ? gabapentin (NEURONTIN) 300 mg capsule Take 300 mg by mouth every 6 hours.   ? glucosamine su 2KCl-chondroit 500-400 mg tab Take 1 Tab by mouth twice daily.   ? ipratropium bromide (ATROVENT) 42 mcg (0.06 %) nasal spray Apply 2 sprays to each nostril as directed twice daily as needed.   ? losartan (COZAAR) 50 mg tablet TAKE 1 TABLET DAILY (Patient taking differently: Take 50 mg by mouth at bedtime daily.)   ? metoprolol XL (TOPROL XL) 100 mg extended release tablet Take one tablet by mouth daily.   ? mucus clearing device (AEROBIKA OSCILLATING PEP SYSTM MISC) Use  as directed. Use with salt water and inhale twice daily   ? MYRBETRIQ 50 mg tablet Take 1 tablet by mouth daily.   ? other medication mupirocin ointment and budesonide solution mixed with warm water  Use as nasal irrigation twice daily   ? pantoprazole DR (PROTONIX) 40 mg tablet Take 40 mg by mouth twice daily.   ? polyethylene glycol 3350 (GLYCOLAX; MIRALAX) 17 gram/dose powder Take 17 g by mouth daily. (Patient taking differently: Take 17 g by mouth nightly as needed.)   ?  rosuvastatin (CRESTOR) 40 mg tablet Take one tablet by mouth daily. (Patient taking differently: Take 40 mg by mouth at bedtime daily.)   ? sildenafil(+) (VIAGRA) 100 mg tablet Take 1 Tab by mouth as Needed for Erectile dysfunction.   ? spironolactone (ALDACTONE) 25 mg tablet Take one tablet by mouth daily. Take with food.   ? tamsulosin (FLOMAX) 0.4 mg capsule Take 0.4 mg by mouth at bedtime daily. Do not crush, chew or open capsules. Take 30 minutes following the same meal each day.    ? testosterone cypionate 200 mg/mL kit Inject 1 mL into the muscle every 14 days.   ? XARELTO 20 mg tablet TAKE 1 TABLET BY MOUTH DAILY - MUST BE WITH EVENING MEAL

## 2019-09-25 MED ORDER — METOPROLOL SUCCINATE 100 MG PO TB24
100 mg | ORAL_TABLET | Freq: Every day | ORAL | 3 refills | 90.00000 days | Status: DC
Start: 2019-09-25 — End: 2019-09-26

## 2019-09-25 MED ORDER — EZETIMIBE 10 MG PO TAB
10 mg | ORAL_TABLET | Freq: Every evening | ORAL | 3 refills | Status: DC
Start: 2019-09-25 — End: 2019-09-26

## 2019-09-25 MED ORDER — LOSARTAN 50 MG PO TAB
50 mg | ORAL_TABLET | Freq: Every evening | ORAL | 3 refills | 30.00000 days | Status: DC
Start: 2019-09-25 — End: 2019-09-26

## 2019-09-25 MED ORDER — ROSUVASTATIN 40 MG PO TAB
40 mg | ORAL_TABLET | Freq: Every evening | ORAL | 3 refills | 90.00000 days | Status: DC
Start: 2019-09-25 — End: 2019-09-26

## 2019-09-25 MED ORDER — FUROSEMIDE 40 MG PO TAB
ORAL_TABLET | Freq: Once | ORAL | 3 refills | 90.00000 days | Status: DC
Start: 2019-09-25 — End: 2019-09-26

## 2019-09-26 ENCOUNTER — Encounter: Admit: 2019-09-26 | Discharge: 2019-09-26 | Payer: MEDICARE

## 2019-09-26 MED ORDER — METOPROLOL SUCCINATE 100 MG PO TB24
100 mg | ORAL_TABLET | Freq: Every day | ORAL | 3 refills | 90.00000 days | Status: AC
Start: 2019-09-26 — End: ?

## 2019-09-26 MED ORDER — EZETIMIBE 10 MG PO TAB
10 mg | ORAL_TABLET | Freq: Every evening | ORAL | 3 refills | Status: AC
Start: 2019-09-26 — End: ?

## 2019-09-26 MED ORDER — FUROSEMIDE 40 MG PO TAB
ORAL_TABLET | Freq: Once | ORAL | 3 refills | 90.00000 days | Status: AC
Start: 2019-09-26 — End: ?

## 2019-09-26 MED ORDER — ROSUVASTATIN 40 MG PO TAB
40 mg | ORAL_TABLET | Freq: Every evening | ORAL | 3 refills | 90.00000 days | Status: AC
Start: 2019-09-26 — End: ?

## 2019-09-26 MED ORDER — SPIRONOLACTONE 25 MG PO TAB
25 mg | ORAL_TABLET | Freq: Every day | ORAL | 3 refills | 90.00000 days | Status: DC
Start: 2019-09-26 — End: 2019-10-21

## 2019-09-26 MED ORDER — LOSARTAN 50 MG PO TAB
50 mg | ORAL_TABLET | Freq: Every evening | ORAL | 3 refills | 30.00000 days | Status: AC
Start: 2019-09-26 — End: ?

## 2019-09-26 NOTE — Telephone Encounter
-----   Message from Golda Acre, LPN sent at 7/0/9628 12:27 PM CST -----  Regarding: RCP- needs med sent to different pharmacy due to insurance. Call him at #[phone].

## 2019-09-26 NOTE — Telephone Encounter
[facility]

## 2019-10-21 ENCOUNTER — Encounter: Admit: 2019-10-21 | Discharge: 2019-10-21 | Payer: MEDICARE

## 2019-10-21 MED ORDER — SPIRONOLACTONE 25 MG PO TAB
25 mg | ORAL_TABLET | Freq: Every day | ORAL | 3 refills | 90.00000 days | Status: AC
Start: 2019-10-21 — End: ?

## 2019-10-21 NOTE — Telephone Encounter
-----   Message from Golda Acre, LPN sent at 09/24/1094  1:26 PM CST -----  Regarding: RCP- refill  VM from patient on triage line.  He got all his medications from [facility] except Spironolactone 25mg .  Did we send it as they did not refill it.  Call him at #[phone].

## 2019-10-21 NOTE — Telephone Encounter
Patient states he received all his medications except Spironolactone 25mg . RN reordered Spironolactone and sent RX to [facility]. Reviewed plan with the patient. Patient verbalized understanding and does not have any further questions or concerns. No further education requested from patient. Patient has our contact information for future needs.

## 2019-12-16 ENCOUNTER — Ambulatory Visit: Admit: 2019-12-16 | Discharge: 2019-12-16 | Payer: MEDICARE

## 2019-12-16 DIAGNOSIS — Z95810 Presence of automatic (implantable) cardiac defibrillator: Secondary | ICD-10-CM

## 2020-02-12 ENCOUNTER — Encounter: Admit: 2020-02-12 | Discharge: 2020-02-12 | Payer: MEDICARE

## 2020-02-12 ENCOUNTER — Ambulatory Visit: Admit: 2020-02-12 | Discharge: 2020-02-12 | Payer: MEDICARE

## 2020-02-12 DIAGNOSIS — J302 Other seasonal allergic rhinitis: Secondary | ICD-10-CM

## 2020-02-12 DIAGNOSIS — I219 Acute myocardial infarction, unspecified: Secondary | ICD-10-CM

## 2020-02-12 DIAGNOSIS — E785 Hyperlipidemia, unspecified: Secondary | ICD-10-CM

## 2020-02-12 DIAGNOSIS — R7303 Prediabetes: Secondary | ICD-10-CM

## 2020-02-12 DIAGNOSIS — M1990 Unspecified osteoarthritis, unspecified site: Secondary | ICD-10-CM

## 2020-02-12 DIAGNOSIS — I480 Paroxysmal atrial fibrillation: Secondary | ICD-10-CM

## 2020-02-12 DIAGNOSIS — I509 Heart failure, unspecified: Secondary | ICD-10-CM

## 2020-02-12 DIAGNOSIS — I10 Essential (primary) hypertension: Secondary | ICD-10-CM

## 2020-02-12 DIAGNOSIS — Z95810 Presence of automatic (implantable) cardiac defibrillator: Secondary | ICD-10-CM

## 2020-02-12 DIAGNOSIS — K449 Diaphragmatic hernia without obstruction or gangrene: Secondary | ICD-10-CM

## 2020-02-12 DIAGNOSIS — G4733 Obstructive sleep apnea (adult) (pediatric): Secondary | ICD-10-CM

## 2020-02-12 DIAGNOSIS — I4891 Unspecified atrial fibrillation: Secondary | ICD-10-CM

## 2020-02-12 DIAGNOSIS — I472 Ventricular tachycardia: Secondary | ICD-10-CM

## 2020-02-12 DIAGNOSIS — I2510 Atherosclerotic heart disease of native coronary artery without angina pectoris: Secondary | ICD-10-CM

## 2020-02-12 DIAGNOSIS — I255 Ischemic cardiomyopathy: Secondary | ICD-10-CM

## 2020-02-12 DIAGNOSIS — M549 Dorsalgia, unspecified: Secondary | ICD-10-CM

## 2020-02-12 DIAGNOSIS — G5600 Carpal tunnel syndrome, unspecified upper limb: Secondary | ICD-10-CM

## 2020-02-12 DIAGNOSIS — K219 Gastro-esophageal reflux disease without esophagitis: Secondary | ICD-10-CM

## 2020-02-12 DIAGNOSIS — T148XXA Other injury of unspecified body region, initial encounter: Secondary | ICD-10-CM

## 2020-02-12 DIAGNOSIS — E669 Obesity, unspecified: Secondary | ICD-10-CM

## 2020-02-12 DIAGNOSIS — I340 Nonrheumatic mitral (valve) insufficiency: Secondary | ICD-10-CM

## 2020-02-12 DIAGNOSIS — R05 Cough: Secondary | ICD-10-CM

## 2020-02-12 DIAGNOSIS — J449 Chronic obstructive pulmonary disease, unspecified: Secondary | ICD-10-CM

## 2020-02-12 NOTE — Patient Instructions
Follow up with Dr. Bradly Bienenstock in  6 weeks    Please schedule the following testing: STRESS TEST    Sleep study-      For NON-URGENT questions please contact us through your MyChart account.   For all medication refills please contact your pharmacy or send a request through MyChart.     For all questions that may need to be addressed urgently please call the nursing triage line at [phone] Monday - Friday 8am-3:30pm only. Please leave a detailed message with your name, date of birth, and reason for your call.      To schedule an appointment call [phone].     Please allow 10-14 business days for the results of any testing to be reviewed. Please call our office if you have not heard from a nurse within this time frame.

## 2020-02-13 ENCOUNTER — Encounter: Admit: 2020-02-13 | Discharge: 2020-02-13 | Payer: MEDICARE

## 2020-02-13 DIAGNOSIS — G5600 Carpal tunnel syndrome, unspecified upper limb: Secondary | ICD-10-CM

## 2020-02-13 DIAGNOSIS — E785 Hyperlipidemia, unspecified: Secondary | ICD-10-CM

## 2020-02-13 DIAGNOSIS — K449 Diaphragmatic hernia without obstruction or gangrene: Secondary | ICD-10-CM

## 2020-02-13 DIAGNOSIS — I219 Acute myocardial infarction, unspecified: Secondary | ICD-10-CM

## 2020-02-13 DIAGNOSIS — I255 Ischemic cardiomyopathy: Secondary | ICD-10-CM

## 2020-02-13 DIAGNOSIS — I340 Nonrheumatic mitral (valve) insufficiency: Secondary | ICD-10-CM

## 2020-02-13 DIAGNOSIS — I2510 Atherosclerotic heart disease of native coronary artery without angina pectoris: Secondary | ICD-10-CM

## 2020-02-13 DIAGNOSIS — I10 Essential (primary) hypertension: Secondary | ICD-10-CM

## 2020-02-13 DIAGNOSIS — R7303 Prediabetes: Secondary | ICD-10-CM

## 2020-02-13 DIAGNOSIS — M1990 Unspecified osteoarthritis, unspecified site: Secondary | ICD-10-CM

## 2020-02-13 DIAGNOSIS — T148XXA Other injury of unspecified body region, initial encounter: Secondary | ICD-10-CM

## 2020-02-13 DIAGNOSIS — I509 Heart failure, unspecified: Secondary | ICD-10-CM

## 2020-02-13 DIAGNOSIS — E669 Obesity, unspecified: Secondary | ICD-10-CM

## 2020-02-13 DIAGNOSIS — R05 Cough: Secondary | ICD-10-CM

## 2020-02-13 DIAGNOSIS — Z95810 Presence of automatic (implantable) cardiac defibrillator: Secondary | ICD-10-CM

## 2020-02-13 DIAGNOSIS — G4733 Obstructive sleep apnea (adult) (pediatric): Secondary | ICD-10-CM

## 2020-02-13 DIAGNOSIS — J449 Chronic obstructive pulmonary disease, unspecified: Secondary | ICD-10-CM

## 2020-02-13 DIAGNOSIS — I4891 Unspecified atrial fibrillation: Secondary | ICD-10-CM

## 2020-02-13 DIAGNOSIS — K219 Gastro-esophageal reflux disease without esophagitis: Secondary | ICD-10-CM

## 2020-02-13 DIAGNOSIS — M549 Dorsalgia, unspecified: Secondary | ICD-10-CM

## 2020-02-13 DIAGNOSIS — J302 Other seasonal allergic rhinitis: Secondary | ICD-10-CM

## 2020-02-18 ENCOUNTER — Encounter: Admit: 2020-02-18 | Discharge: 2020-02-18 | Payer: MEDICARE

## 2020-02-18 DIAGNOSIS — I255 Ischemic cardiomyopathy: Secondary | ICD-10-CM

## 2020-02-18 DIAGNOSIS — I480 Paroxysmal atrial fibrillation: Secondary | ICD-10-CM

## 2020-02-18 DIAGNOSIS — I472 Ventricular tachycardia: Secondary | ICD-10-CM

## 2020-02-19 ENCOUNTER — Ambulatory Visit: Admit: 2020-02-19 | Discharge: 2020-02-19 | Payer: MEDICARE

## 2020-02-19 ENCOUNTER — Encounter: Admit: 2020-02-19 | Discharge: 2020-02-19 | Payer: MEDICARE

## 2020-02-19 DIAGNOSIS — I472 Ventricular tachycardia: Secondary | ICD-10-CM

## 2020-02-19 DIAGNOSIS — I480 Paroxysmal atrial fibrillation: Secondary | ICD-10-CM

## 2020-02-19 DIAGNOSIS — I255 Ischemic cardiomyopathy: Secondary | ICD-10-CM

## 2020-02-21 ENCOUNTER — Encounter: Admit: 2020-02-21 | Discharge: 2020-02-21 | Payer: MEDICARE

## 2020-02-21 NOTE — Telephone Encounter
Returned call and had to leave VM.

## 2020-02-21 NOTE — Telephone Encounter
-----   Message from Eastmont Meerdink sent at 02/21/2020  8:48 AM CDT -----  Regarding: RCP-Results  Patient had Stress Test yesterday and received results on Mychart. They are planning a 2 week vacation that starts next week. And would like to speak with nurse about results

## 2020-02-21 NOTE — Telephone Encounter
Johnny Cornfield, MD  You 12 minutes ago (1:49 PM)     Can you tell him I went over it with Dr. Hale Bogus and the abnormality is very small, not worth doing anything about. ?Enjoy his vacation. ?Thanks     RCP        02/21/2020 2:05 PM: Called and discussed with pt. He was very relieved and thankful to hear back from RCP so soon. He denies further questions or concerns.

## 2020-03-15 ENCOUNTER — Encounter: Admit: 2020-03-15 | Discharge: 2020-03-15 | Payer: MEDICARE

## 2020-03-17 ENCOUNTER — Encounter: Admit: 2020-03-17 | Discharge: 2020-03-17 | Payer: MEDICARE

## 2020-03-17 DIAGNOSIS — Z95810 Presence of automatic (implantable) cardiac defibrillator: Secondary | ICD-10-CM

## 2020-03-18 ENCOUNTER — Encounter: Admit: 2020-03-18 | Discharge: 2020-03-18 | Payer: MEDICARE

## 2020-03-18 NOTE — Telephone Encounter
-----   Message from Golda Acre, LPN sent at 2/95/1884  4:05 PM CDT -----  Regarding: RCP- appt issue  VM on triage line from wife Leta Jungling [phone].  Said that they had to reschedule appointment and now cannot get in till 06-24-20.  Can he be seen sooner?

## 2020-04-14 ENCOUNTER — Encounter: Admit: 2020-04-14 | Discharge: 2020-04-14 | Payer: MEDICARE

## 2020-05-13 ENCOUNTER — Encounter: Admit: 2020-05-13 | Discharge: 2020-05-13 | Payer: MEDICARE

## 2020-05-13 NOTE — Telephone Encounter
Received notification that  Medtronic Carelink has not been connected since 04/16/20. Patient was instructed to look at his/her transmitter to make sure that it is plugged into power and send a manual transmission to reconnect the transmitter. If he/she has any questions about how to send a transmission or if the transmitter does not appear to be working properly, they need to contact the device company directly. Patient was provided with that contact number. Requested the patient send [place] a MyChart message or contact our device nurses at [phone] to let us know after they have sent their transmission. Message left on machine. MyChart message sent. DB.

## 2020-05-18 ENCOUNTER — Encounter: Admit: 2020-05-18 | Discharge: 2020-05-18 | Payer: MEDICARE

## 2020-06-03 ENCOUNTER — Encounter: Admit: 2020-06-03 | Discharge: 2020-06-03 | Payer: MEDICARE

## 2020-06-03 NOTE — Progress Notes
Request for the following medical records for purpose of continuity of care:   Has an appointment with RCP on 06/04/2020    Please send d/c summary, consult notes, procedure reports, EKG and labs from recent hospital stay.  Please include any additional cardiac information or testing.    Please Fax to:  [facility] - [phone]  Dr. Bradly Bienenstock  Attention:  Maralyn Sago, RN    Thank you

## 2020-06-04 ENCOUNTER — Ambulatory Visit: Admit: 2020-06-04 | Discharge: 2020-06-04 | Payer: MEDICARE

## 2020-06-04 ENCOUNTER — Encounter: Admit: 2020-06-04 | Discharge: 2020-06-04 | Payer: MEDICARE

## 2020-06-04 DIAGNOSIS — I4891 Unspecified atrial fibrillation: Secondary | ICD-10-CM

## 2020-06-04 DIAGNOSIS — R7303 Prediabetes: Secondary | ICD-10-CM

## 2020-06-04 DIAGNOSIS — I509 Heart failure, unspecified: Secondary | ICD-10-CM

## 2020-06-04 DIAGNOSIS — G5600 Carpal tunnel syndrome, unspecified upper limb: Secondary | ICD-10-CM

## 2020-06-04 DIAGNOSIS — R05 Cough: Secondary | ICD-10-CM

## 2020-06-04 DIAGNOSIS — K449 Diaphragmatic hernia without obstruction or gangrene: Secondary | ICD-10-CM

## 2020-06-04 DIAGNOSIS — I255 Ischemic cardiomyopathy: Secondary | ICD-10-CM

## 2020-06-04 DIAGNOSIS — E7800 Pure hypercholesterolemia, unspecified: Secondary | ICD-10-CM

## 2020-06-04 DIAGNOSIS — I2510 Atherosclerotic heart disease of native coronary artery without angina pectoris: Secondary | ICD-10-CM

## 2020-06-04 DIAGNOSIS — M549 Dorsalgia, unspecified: Secondary | ICD-10-CM

## 2020-06-04 DIAGNOSIS — I10 Essential (primary) hypertension: Secondary | ICD-10-CM

## 2020-06-04 DIAGNOSIS — E669 Obesity, unspecified: Secondary | ICD-10-CM

## 2020-06-04 DIAGNOSIS — G4733 Obstructive sleep apnea (adult) (pediatric): Secondary | ICD-10-CM

## 2020-06-04 DIAGNOSIS — I480 Paroxysmal atrial fibrillation: Secondary | ICD-10-CM

## 2020-06-04 DIAGNOSIS — T148XXA Other injury of unspecified body region, initial encounter: Secondary | ICD-10-CM

## 2020-06-04 DIAGNOSIS — E785 Hyperlipidemia, unspecified: Secondary | ICD-10-CM

## 2020-06-04 DIAGNOSIS — K219 Gastro-esophageal reflux disease without esophagitis: Secondary | ICD-10-CM

## 2020-06-04 DIAGNOSIS — M1990 Unspecified osteoarthritis, unspecified site: Secondary | ICD-10-CM

## 2020-06-04 DIAGNOSIS — I219 Acute myocardial infarction, unspecified: Secondary | ICD-10-CM

## 2020-06-04 DIAGNOSIS — I472 Ventricular tachycardia: Secondary | ICD-10-CM

## 2020-06-04 DIAGNOSIS — J449 Chronic obstructive pulmonary disease, unspecified: Secondary | ICD-10-CM

## 2020-06-04 DIAGNOSIS — I340 Nonrheumatic mitral (valve) insufficiency: Secondary | ICD-10-CM

## 2020-06-04 DIAGNOSIS — Z95810 Presence of automatic (implantable) cardiac defibrillator: Secondary | ICD-10-CM

## 2020-06-04 DIAGNOSIS — J302 Other seasonal allergic rhinitis: Secondary | ICD-10-CM

## 2020-06-07 ENCOUNTER — Encounter: Admit: 2020-06-07 | Discharge: 2020-06-07 | Payer: MEDICARE

## 2020-06-07 DIAGNOSIS — J449 Chronic obstructive pulmonary disease, unspecified: Secondary | ICD-10-CM

## 2020-06-07 DIAGNOSIS — R7303 Prediabetes: Secondary | ICD-10-CM

## 2020-06-07 DIAGNOSIS — M1990 Unspecified osteoarthritis, unspecified site: Secondary | ICD-10-CM

## 2020-06-07 DIAGNOSIS — G5600 Carpal tunnel syndrome, unspecified upper limb: Secondary | ICD-10-CM

## 2020-06-07 DIAGNOSIS — T148XXA Other injury of unspecified body region, initial encounter: Secondary | ICD-10-CM

## 2020-06-07 DIAGNOSIS — E785 Hyperlipidemia, unspecified: Secondary | ICD-10-CM

## 2020-06-07 DIAGNOSIS — I10 Essential (primary) hypertension: Secondary | ICD-10-CM

## 2020-06-07 DIAGNOSIS — G4733 Obstructive sleep apnea (adult) (pediatric): Secondary | ICD-10-CM

## 2020-06-07 DIAGNOSIS — M549 Dorsalgia, unspecified: Secondary | ICD-10-CM

## 2020-06-07 DIAGNOSIS — Z95810 Presence of automatic (implantable) cardiac defibrillator: Secondary | ICD-10-CM

## 2020-06-07 DIAGNOSIS — J302 Other seasonal allergic rhinitis: Secondary | ICD-10-CM

## 2020-06-07 DIAGNOSIS — I4891 Unspecified atrial fibrillation: Secondary | ICD-10-CM

## 2020-06-07 DIAGNOSIS — I255 Ischemic cardiomyopathy: Secondary | ICD-10-CM

## 2020-06-07 DIAGNOSIS — I509 Heart failure, unspecified: Secondary | ICD-10-CM

## 2020-06-07 DIAGNOSIS — E669 Obesity, unspecified: Secondary | ICD-10-CM

## 2020-06-07 DIAGNOSIS — I219 Acute myocardial infarction, unspecified: Secondary | ICD-10-CM

## 2020-06-07 DIAGNOSIS — R05 Cough: Secondary | ICD-10-CM

## 2020-06-07 DIAGNOSIS — K449 Diaphragmatic hernia without obstruction or gangrene: Secondary | ICD-10-CM

## 2020-06-07 DIAGNOSIS — I340 Nonrheumatic mitral (valve) insufficiency: Secondary | ICD-10-CM

## 2020-06-07 DIAGNOSIS — K219 Gastro-esophageal reflux disease without esophagitis: Secondary | ICD-10-CM

## 2020-06-07 DIAGNOSIS — I2510 Atherosclerotic heart disease of native coronary artery without angina pectoris: Secondary | ICD-10-CM

## 2020-06-09 ENCOUNTER — Encounter: Admit: 2020-06-09 | Discharge: 2020-06-09 | Payer: MEDICARE

## 2020-06-09 DIAGNOSIS — E7800 Pure hypercholesterolemia, unspecified: Secondary | ICD-10-CM

## 2020-06-09 DIAGNOSIS — I480 Paroxysmal atrial fibrillation: Secondary | ICD-10-CM

## 2020-06-09 LAB — BASIC METABOLIC PANEL
Lab: 138 g/dL (ref 3.5–5.0)
Lab: 26 MMOL/L (ref 20–28)
Lab: 31 U/L — ABNORMAL HIGH (ref 8.4–25.7)
Lab: 5.2 U/L — ABNORMAL HIGH (ref 3.5–5.1)

## 2020-06-09 LAB — LIPID PROFILE
Lab: 3
Lab: 117 mL/min
Lab: 138 (ref 3–12)
Lab: 23
Lab: 55 mL/min
Lab: 60

## 2020-06-10 ENCOUNTER — Encounter: Admit: 2020-06-10 | Discharge: 2020-06-10 | Payer: MEDICARE

## 2020-06-10 DIAGNOSIS — I10 Essential (primary) hypertension: Secondary | ICD-10-CM

## 2020-06-10 DIAGNOSIS — I480 Paroxysmal atrial fibrillation: Secondary | ICD-10-CM

## 2020-06-10 LAB — CBC
Lab: 14
Lab: 14 — ABNORMAL HIGH (ref 4.8–10.8)
Lab: 16 — ABNORMAL HIGH (ref 11.5–14.5)
Lab: 164
Lab: 26 — ABNORMAL LOW (ref 27–31)
Lab: 31
Lab: 47
Lab: 5.5
Lab: 84

## 2020-06-10 NOTE — Telephone Encounter
Ok to continue spiro but would hold KCL x 2 days and then resume at just 20 meq daily and recheck bmp in 1 week     [place]

## 2020-06-10 NOTE — Telephone Encounter
-----   Message from Tona Sensing, APRN-NP sent at 06/09/2020  5:46 PM CDT -----  Need to stop spiro and recheck in 1 week  ----- Message -----  From: Glennon Mac, RN  Sent: 06/09/2020   3:24 PM CDT  To: Tona Sensing, APRN-NP, #    Potassium high at 5.2, Crt creeping up at 1.25, lipids all a bit higher than last check. On 40mg  Crestor and 10mg  Zetia. Please advise.

## 2020-06-10 NOTE — Telephone Encounter
Called and spoke to pt. Reviewed recs by KA to stop spiro and get BMP 1 week. Pt states his PCP put him on KCL BID for muscle cramps for the last 6 weeks.  This was not recorded on our med list.   Will review again with KA and call back with recs.

## 2020-06-24 ENCOUNTER — Encounter: Admit: 2020-06-24 | Discharge: 2020-06-24 | Payer: MEDICARE

## 2020-07-08 ENCOUNTER — Encounter: Admit: 2020-07-08 | Discharge: 2020-07-08 | Payer: MEDICARE

## 2020-07-08 NOTE — Telephone Encounter
Patient was scheduled for a Medtronic Carelink on 06/15/20 that has not been received.   Patient was instructed to send a manual transmission. Instructed if the transmitter does not appear to be working properly, they need to contact the device company directly. Patient was provided with that contact number. Requested the patient send [place] a MyChart message or contact our device nurses at [phone] to let us know after they have sent their transmission.  Called preferred phone number [phone], got no answer LVM. MyChart Is set up and last login on 03/30/20. MyChart message sent.     CDJ

## 2020-07-14 ENCOUNTER — Encounter: Admit: 2020-07-14 | Discharge: 2020-07-14 | Payer: MEDICARE

## 2020-07-14 DIAGNOSIS — I519 Heart disease, unspecified: Secondary | ICD-10-CM

## 2020-07-14 DIAGNOSIS — R7303 Prediabetes: Secondary | ICD-10-CM

## 2020-07-14 DIAGNOSIS — I255 Ischemic cardiomyopathy: Secondary | ICD-10-CM

## 2020-07-14 DIAGNOSIS — M549 Dorsalgia, unspecified: Secondary | ICD-10-CM

## 2020-07-14 DIAGNOSIS — G5600 Carpal tunnel syndrome, unspecified upper limb: Secondary | ICD-10-CM

## 2020-07-14 DIAGNOSIS — T148XXA Other injury of unspecified body region, initial encounter: Secondary | ICD-10-CM

## 2020-07-14 DIAGNOSIS — E7800 Pure hypercholesterolemia, unspecified: Secondary | ICD-10-CM

## 2020-07-14 DIAGNOSIS — I10 Essential (primary) hypertension: Secondary | ICD-10-CM

## 2020-07-14 DIAGNOSIS — M1990 Unspecified osteoarthritis, unspecified site: Secondary | ICD-10-CM

## 2020-07-14 DIAGNOSIS — G4730 Sleep apnea, unspecified: Secondary | ICD-10-CM

## 2020-07-14 DIAGNOSIS — Z95810 Presence of automatic (implantable) cardiac defibrillator: Secondary | ICD-10-CM

## 2020-07-14 DIAGNOSIS — K449 Diaphragmatic hernia without obstruction or gangrene: Secondary | ICD-10-CM

## 2020-07-14 DIAGNOSIS — J449 Chronic obstructive pulmonary disease, unspecified: Secondary | ICD-10-CM

## 2020-07-14 DIAGNOSIS — I480 Paroxysmal atrial fibrillation: Secondary | ICD-10-CM

## 2020-07-14 DIAGNOSIS — I219 Acute myocardial infarction, unspecified: Secondary | ICD-10-CM

## 2020-07-14 DIAGNOSIS — E785 Hyperlipidemia, unspecified: Secondary | ICD-10-CM

## 2020-07-14 DIAGNOSIS — E669 Obesity, unspecified: Secondary | ICD-10-CM

## 2020-07-14 DIAGNOSIS — G4733 Obstructive sleep apnea (adult) (pediatric): Secondary | ICD-10-CM

## 2020-07-14 DIAGNOSIS — K219 Gastro-esophageal reflux disease without esophagitis: Secondary | ICD-10-CM

## 2020-07-14 DIAGNOSIS — I509 Heart failure, unspecified: Secondary | ICD-10-CM

## 2020-07-14 DIAGNOSIS — M5416 Radiculopathy, lumbar region: Secondary | ICD-10-CM

## 2020-07-14 DIAGNOSIS — I2510 Atherosclerotic heart disease of native coronary artery without angina pectoris: Secondary | ICD-10-CM

## 2020-07-14 DIAGNOSIS — I340 Nonrheumatic mitral (valve) insufficiency: Secondary | ICD-10-CM

## 2020-07-14 DIAGNOSIS — I472 Ventricular tachycardia: Secondary | ICD-10-CM

## 2020-07-14 DIAGNOSIS — I4891 Unspecified atrial fibrillation: Secondary | ICD-10-CM

## 2020-07-14 DIAGNOSIS — J302 Other seasonal allergic rhinitis: Secondary | ICD-10-CM

## 2020-07-14 DIAGNOSIS — R053 Chronic cough: Secondary | ICD-10-CM

## 2020-07-14 MED ORDER — SPIRONOLACTONE 25 MG PO TAB
12.5 mg | ORAL_TABLET | Freq: Every day | ORAL | 3 refills | 46.00000 days | Status: AC
Start: 2020-07-14 — End: ?

## 2020-07-15 ENCOUNTER — Encounter: Admit: 2020-07-15 | Discharge: 2020-07-15 | Payer: MEDICARE

## 2020-07-15 DIAGNOSIS — E7800 Pure hypercholesterolemia, unspecified: Secondary | ICD-10-CM

## 2020-07-15 DIAGNOSIS — Z95810 Presence of automatic (implantable) cardiac defibrillator: Secondary | ICD-10-CM

## 2020-07-15 DIAGNOSIS — I472 Ventricular tachycardia: Secondary | ICD-10-CM

## 2020-07-15 DIAGNOSIS — I480 Paroxysmal atrial fibrillation: Secondary | ICD-10-CM

## 2020-07-15 DIAGNOSIS — I10 Essential (primary) hypertension: Secondary | ICD-10-CM

## 2020-07-15 DIAGNOSIS — M5416 Radiculopathy, lumbar region: Secondary | ICD-10-CM

## 2020-07-15 DIAGNOSIS — G4730 Sleep apnea, unspecified: Secondary | ICD-10-CM

## 2020-07-15 DIAGNOSIS — I519 Heart disease, unspecified: Secondary | ICD-10-CM

## 2020-07-15 DIAGNOSIS — I255 Ischemic cardiomyopathy: Secondary | ICD-10-CM

## 2020-07-16 ENCOUNTER — Encounter: Admit: 2020-07-16 | Discharge: 2020-07-16 | Payer: MEDICARE

## 2020-07-22 ENCOUNTER — Encounter: Admit: 2020-07-22 | Discharge: 2020-07-22 | Payer: MEDICARE

## 2020-07-22 NOTE — Telephone Encounter
Follow up remote device report fluid overload:    Patient was seen yesterday in clinic for office visit and physical exam.  Diuretics were adjusted accordingly.  Please refer to Dr. Francee Nodal office visit note 07/14/20 for further detail.

## 2020-07-22 NOTE — Telephone Encounter
-----   Message from Dyke Maes, RN sent at 07/15/2020 11:05 AM CDT -----  Regarding: FW: Possible Fluid Accumulation    ----- Message -----  From: Algis Liming  Sent: 07/15/2020  11:01 AM CDT  To: Cvm Nurse Gen Card Team Gold  Subject: Possible Fluid Accumulation                      Recent remote showing possible fluid accumulation per trends. This has been ongoing since 07/14/20 and remains elevated at this time. Please follow up as needed.    Thanks,  Ashley/ device team

## 2020-07-23 ENCOUNTER — Encounter: Admit: 2020-07-23 | Discharge: 2020-07-23 | Payer: MEDICARE

## 2020-07-23 DIAGNOSIS — Z95810 Presence of automatic (implantable) cardiac defibrillator: Secondary | ICD-10-CM

## 2020-07-23 NOTE — Progress Notes
1.5 and 3T conditional.  CT chest from 10/2018 in place.  Will need device check only    Conditional? Or Non conditional?    MRI L-SPINE WO/W CONTRAST    Ordering Physician: Mable Paris, MD        GENERATOR ATRIAL LEAD RV LEAD LV LEAD   Manufacturer: Medtronic             Model Number: Evera MRI XT DR [id number] 4076-52cm Sprint Quattro Secure S MRI [id number]     Serial Number: [id number] Rexene Edison [id number] Seth Bake [id number]     Implant Date: 2018-03-13 01/06/2006 03/13/2018     Device Type: ICD         [facility]

## 2020-07-30 ENCOUNTER — Encounter: Admit: 2020-07-30 | Discharge: 2020-07-30 | Payer: MEDICARE

## 2020-07-30 DIAGNOSIS — I480 Paroxysmal atrial fibrillation: Secondary | ICD-10-CM

## 2020-07-30 DIAGNOSIS — G4730 Sleep apnea, unspecified: Secondary | ICD-10-CM

## 2020-07-30 NOTE — Telephone Encounter
-----   Message from Mable Paris, MD sent at 07/29/2020  6:39 AM CST -----  Regarding: FW: New patient for you please  Hello team At[place],   Can you manage the process below? Thanks   CP   ----- Message -----  From: Ulyess Blossom, LPN  Sent: 05/27/1190   9:27 AM CST  To: Mable Paris, MD  Subject: RE: New patient for you please                   Dr. Hale Bogus -       If you will place a referral for this patient our schedulers can call him and get him scheduled.  I believe we are booking into March for the first available but we can place him on a waiting list and if something were to come available sooner, they could call him and offer him an earlier appointment.  Place the referral to [facility] and then choose Dr. Threasa Heads as the preferred provider.      Thank you,   Cherre Blanc, LPN   Dr. Gwendalyn Ege, MD   ----- Message -----  From: Marlyne Beards, LPN  Sent: 47/04/2955   8:07 AM CDT  To: Ulyess Blossom, LPN, #  Subject: FW: New patient for you please                     ----- Message -----  From: Hanley Ben, MD  Sent: 07/22/2020   2:03 PM CDT  To: Marlyne Beards, LPN  Subject: FW: New patient for you please                   Can you forward this for Dr. Threasa Heads to put him as a new patient?  Thanks.  ----- Message -----  From: Mable Paris, MD  Sent: 07/14/2020   9:54 PM CDT  To: Hanley Ben, MD  Subject: New patient for you please                       Good evening,   Today Johnny [name] came to see me in clinic for which was the first time since June 2020 for a clinic visit.  In addition to his cardiac issues none of which are really acute, he tells me his  sleep apnea needs evaluation at [facility] as his first choice.  Is gotten some inconsistent reports about what his settings should be basically from a respiratory therapist in [place].  There is also an issue with tremor although I did not get into that with him today on his cardiology visit.  Can you see this patient to evaluate his sleep apnea.     Marland Kitchen  He lives in [place] but thinks highly of [place] in has trouble getting into the system this is a lot more complicated for him than dealing with [facility] services.  He knows however that it is worth the trouble as our services are much higher value than what he can get locally in [place] or at [facility] in [place].Thanks very much for what ever you can do.  You may not be able to see him soon but at least if you can offer him an appointment that  will encourage him.    Thanks  [employer]

## 2020-08-04 ENCOUNTER — Encounter: Admit: 2020-08-04 | Discharge: 2020-08-04 | Payer: MEDICARE

## 2020-08-24 ENCOUNTER — Ambulatory Visit: Admit: 2020-08-24 | Discharge: 2020-08-24 | Payer: MEDICARE

## 2020-08-24 ENCOUNTER — Encounter: Admit: 2020-08-24 | Discharge: 2020-08-24 | Payer: MEDICARE

## 2020-08-24 DIAGNOSIS — Z959 Presence of cardiac and vascular implant and graft, unspecified: Secondary | ICD-10-CM

## 2020-08-24 DIAGNOSIS — Z95810 Presence of automatic (implantable) cardiac defibrillator: Secondary | ICD-10-CM

## 2020-08-24 DIAGNOSIS — I519 Heart disease, unspecified: Secondary | ICD-10-CM

## 2020-08-24 DIAGNOSIS — M5416 Radiculopathy, lumbar region: Secondary | ICD-10-CM

## 2020-08-24 DIAGNOSIS — I472 Ventricular tachycardia: Secondary | ICD-10-CM

## 2020-08-24 DIAGNOSIS — I480 Paroxysmal atrial fibrillation: Secondary | ICD-10-CM

## 2020-08-24 DIAGNOSIS — E7800 Pure hypercholesterolemia, unspecified: Secondary | ICD-10-CM

## 2020-08-24 DIAGNOSIS — G4730 Sleep apnea, unspecified: Secondary | ICD-10-CM

## 2020-08-24 DIAGNOSIS — I255 Ischemic cardiomyopathy: Secondary | ICD-10-CM

## 2020-08-24 DIAGNOSIS — I10 Essential (primary) hypertension: Secondary | ICD-10-CM

## 2020-08-24 MED ORDER — GADOBENATE DIMEGLUMINE 529 MG/ML (0.1MMOL/0.2ML) IV SOLN
20 mL | Freq: Once | INTRAVENOUS | 0 refills | Status: CP
Start: 2020-08-24 — End: ?
  Administered 2020-08-24: 21:00:00 20 mL via INTRAVENOUS

## 2020-08-24 NOTE — Progress Notes
Procedure: MRI L Spine w wo contrast    Pacemaker Type: Conditional    Order verified: Yes    Allergies reviewed: Yes    Patient history reviewed: Yes      Conditional Pacemaker: Minimum requirements, vital signs needed pre/post scan (See DocFlowsheet for further).      Pt placed on MRI safe cardiac, BP and O2 monitors.   ?   Pre procedure Vital Signs (See DocFlowsheet for further).    MRI Device Settings placed by EP Device Staff, Brittney H RN.    Program Mode: ODO    Pacing Mode: Off    If pacing mode on Pacing Rate set at: NA     Device returned back to prior MRI device settings post-scan by EP Device staff, Brittney H  RN.at 1455    Post procedure Vital Signs (See DocFlowsheet for further).

## 2020-08-25 ENCOUNTER — Encounter: Admit: 2020-08-25 | Discharge: 2020-08-25 | Payer: MEDICARE

## 2020-08-25 DIAGNOSIS — N2889 Other specified disorders of kidney and ureter: Secondary | ICD-10-CM

## 2020-08-27 ENCOUNTER — Encounter: Admit: 2020-08-27 | Discharge: 2020-08-27 | Payer: MEDICARE

## 2020-08-27 NOTE — Telephone Encounter
Called patient and reviewed MRI results.  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  Radiologist recommended MRI abd wo contrast (renal mass protocol)

## 2020-09-02 ENCOUNTER — Encounter: Admit: 2020-09-02 | Discharge: 2020-09-02 | Payer: MEDICARE

## 2020-09-02 NOTE — Telephone Encounter
I called patient. He is having issues with Carelink monitor. Pt tried today but we did not receive. I asked pt to call Carelink. He said it may be a day or so. I flagged EP remote pool to check on this soon.

## 2020-09-07 ENCOUNTER — Encounter: Admit: 2020-09-07 | Discharge: 2020-09-07 | Payer: MEDICARE

## 2020-09-09 ENCOUNTER — Encounter: Admit: 2020-09-09 | Discharge: 2020-09-09 | Payer: MEDICARE

## 2020-09-14 ENCOUNTER — Encounter: Admit: 2020-09-14 | Discharge: 2020-09-14 | Payer: MEDICARE

## 2020-09-14 NOTE — Telephone Encounter
-----   Message from Neta Ehlers, RN sent at 09/14/2020  1:26 PM CST -----  Regarding: FW: Optivol Elevated, RCP Patient    ----- Message -----  From: Almira Coaster  Sent: 09/14/2020  12:57 PM CST  To: Cvm Nurse Gen Card Team Gold  Subject: Optivol Elevated, RCP Patient                    Good afternoon,    This patient had a remote today showing their OptiVol remains elevated and rising since October. Please see attached. Thank you.

## 2020-10-01 ENCOUNTER — Encounter: Admit: 2020-10-01 | Discharge: 2020-10-01 | Payer: MEDICARE

## 2020-10-01 ENCOUNTER — Ambulatory Visit: Admit: 2020-10-01 | Discharge: 2020-10-02 | Payer: MEDICARE

## 2020-10-01 DIAGNOSIS — K219 Gastro-esophageal reflux disease without esophagitis: Secondary | ICD-10-CM

## 2020-10-01 DIAGNOSIS — K449 Diaphragmatic hernia without obstruction or gangrene: Secondary | ICD-10-CM

## 2020-10-01 DIAGNOSIS — I255 Ischemic cardiomyopathy: Secondary | ICD-10-CM

## 2020-10-01 DIAGNOSIS — G5600 Carpal tunnel syndrome, unspecified upper limb: Secondary | ICD-10-CM

## 2020-10-01 DIAGNOSIS — J449 Chronic obstructive pulmonary disease, unspecified: Secondary | ICD-10-CM

## 2020-10-01 DIAGNOSIS — G4733 Obstructive sleep apnea (adult) (pediatric): Secondary | ICD-10-CM

## 2020-10-01 DIAGNOSIS — I4891 Unspecified atrial fibrillation: Secondary | ICD-10-CM

## 2020-10-01 DIAGNOSIS — M549 Dorsalgia, unspecified: Secondary | ICD-10-CM

## 2020-10-01 DIAGNOSIS — I509 Heart failure, unspecified: Secondary | ICD-10-CM

## 2020-10-01 DIAGNOSIS — Z95810 Presence of automatic (implantable) cardiac defibrillator: Secondary | ICD-10-CM

## 2020-10-01 DIAGNOSIS — E785 Hyperlipidemia, unspecified: Secondary | ICD-10-CM

## 2020-10-01 DIAGNOSIS — M5412 Radiculopathy, cervical region: Secondary | ICD-10-CM

## 2020-10-01 DIAGNOSIS — I340 Nonrheumatic mitral (valve) insufficiency: Secondary | ICD-10-CM

## 2020-10-01 DIAGNOSIS — M1990 Unspecified osteoarthritis, unspecified site: Secondary | ICD-10-CM

## 2020-10-01 DIAGNOSIS — R7303 Prediabetes: Secondary | ICD-10-CM

## 2020-10-01 DIAGNOSIS — I219 Acute myocardial infarction, unspecified: Secondary | ICD-10-CM

## 2020-10-01 DIAGNOSIS — T148XXA Other injury of unspecified body region, initial encounter: Secondary | ICD-10-CM

## 2020-10-01 DIAGNOSIS — I10 Essential (primary) hypertension: Secondary | ICD-10-CM

## 2020-10-01 DIAGNOSIS — R053 Chronic cough: Secondary | ICD-10-CM

## 2020-10-01 DIAGNOSIS — E669 Obesity, unspecified: Secondary | ICD-10-CM

## 2020-10-01 DIAGNOSIS — J302 Other seasonal allergic rhinitis: Secondary | ICD-10-CM

## 2020-10-01 DIAGNOSIS — I2510 Atherosclerotic heart disease of native coronary artery without angina pectoris: Secondary | ICD-10-CM

## 2020-10-01 MED ORDER — VACUUM ERECTION DEVICE SYSTEM MISC KIT
PACK | 11 refills | Status: AC
Start: 2020-10-01 — End: ?

## 2020-10-03 ENCOUNTER — Encounter: Admit: 2020-10-03 | Discharge: 2020-10-03 | Payer: MEDICARE

## 2020-10-03 DIAGNOSIS — E669 Obesity, unspecified: Secondary | ICD-10-CM

## 2020-10-03 DIAGNOSIS — J449 Chronic obstructive pulmonary disease, unspecified: Secondary | ICD-10-CM

## 2020-10-03 DIAGNOSIS — I255 Ischemic cardiomyopathy: Secondary | ICD-10-CM

## 2020-10-03 DIAGNOSIS — G4733 Obstructive sleep apnea (adult) (pediatric): Secondary | ICD-10-CM

## 2020-10-03 DIAGNOSIS — G5600 Carpal tunnel syndrome, unspecified upper limb: Secondary | ICD-10-CM

## 2020-10-03 DIAGNOSIS — Z95810 Presence of automatic (implantable) cardiac defibrillator: Secondary | ICD-10-CM

## 2020-10-03 DIAGNOSIS — K449 Diaphragmatic hernia without obstruction or gangrene: Secondary | ICD-10-CM

## 2020-10-03 DIAGNOSIS — I10 Essential (primary) hypertension: Secondary | ICD-10-CM

## 2020-10-03 DIAGNOSIS — I2510 Atherosclerotic heart disease of native coronary artery without angina pectoris: Secondary | ICD-10-CM

## 2020-10-03 DIAGNOSIS — M1990 Unspecified osteoarthritis, unspecified site: Secondary | ICD-10-CM

## 2020-10-03 DIAGNOSIS — K219 Gastro-esophageal reflux disease without esophagitis: Secondary | ICD-10-CM

## 2020-10-03 DIAGNOSIS — R053 Chronic cough: Secondary | ICD-10-CM

## 2020-10-03 DIAGNOSIS — N529 Male erectile dysfunction, unspecified: Secondary | ICD-10-CM

## 2020-10-03 DIAGNOSIS — J302 Other seasonal allergic rhinitis: Secondary | ICD-10-CM

## 2020-10-03 DIAGNOSIS — E785 Hyperlipidemia, unspecified: Secondary | ICD-10-CM

## 2020-10-03 DIAGNOSIS — I340 Nonrheumatic mitral (valve) insufficiency: Secondary | ICD-10-CM

## 2020-10-03 DIAGNOSIS — M549 Dorsalgia, unspecified: Secondary | ICD-10-CM

## 2020-10-03 DIAGNOSIS — I4891 Unspecified atrial fibrillation: Secondary | ICD-10-CM

## 2020-10-03 DIAGNOSIS — I509 Heart failure, unspecified: Secondary | ICD-10-CM

## 2020-10-03 DIAGNOSIS — I219 Acute myocardial infarction, unspecified: Secondary | ICD-10-CM

## 2020-10-03 DIAGNOSIS — R7303 Prediabetes: Secondary | ICD-10-CM

## 2020-10-03 DIAGNOSIS — T148XXA Other injury of unspecified body region, initial encounter: Secondary | ICD-10-CM

## 2020-10-06 ENCOUNTER — Encounter: Admit: 2020-10-06 | Discharge: 2020-10-06 | Payer: MEDICARE

## 2020-10-06 DIAGNOSIS — Z95810 Presence of automatic (implantable) cardiac defibrillator: Secondary | ICD-10-CM

## 2020-10-06 NOTE — Progress Notes
Exam: MRI ABD/C-SPINE WO/W contrast    Device Conditional. PT had recent MRI here, other body part.    3.7.22, Device check 9, MRI 10.

## 2020-10-07 ENCOUNTER — Encounter: Admit: 2020-10-07 | Discharge: 2020-10-07 | Payer: MEDICARE

## 2020-10-07 NOTE — Telephone Encounter
Received notification that  Medtronic Carelink has not been connected since 09/16/20.     Patient was instructed to look at his/her transmitter to make sure that it is plugged into power and send a manual transmission to reconnect the transmitter. If he/she has any questions about how to send a transmission or if the transmitter does not appear to be working properly, they need to contact the device company directly. Patient was provided with that contact number. Requested the patient send [place] a MyChart message or contact our device nurses at [phone] to let us know after they have sent their transmission. Called preferred phone number, got no answer LVM, MyChart is set up MyChart sent. CDJ

## 2020-10-12 ENCOUNTER — Encounter: Admit: 2020-10-12 | Discharge: 2020-10-12 | Payer: MEDICARE

## 2020-10-12 MED ORDER — FUROSEMIDE 40 MG PO TAB
ORAL_TABLET | Freq: Every day | ORAL | 3 refills | 90.00000 days | Status: AC
Start: 2020-10-12 — End: ?

## 2020-10-12 MED ORDER — LOSARTAN 50 MG PO TAB
ORAL_TABLET | Freq: Every evening | ORAL | 3 refills | 30.00000 days | Status: AC
Start: 2020-10-12 — End: ?

## 2020-10-21 ENCOUNTER — Encounter: Admit: 2020-10-21 | Discharge: 2020-10-21 | Payer: MEDICARE

## 2020-10-21 ENCOUNTER — Ambulatory Visit: Admit: 2020-10-21 | Discharge: 2020-10-22 | Payer: MEDICARE

## 2020-10-21 DIAGNOSIS — G5600 Carpal tunnel syndrome, unspecified upper limb: Secondary | ICD-10-CM

## 2020-10-21 DIAGNOSIS — M1990 Unspecified osteoarthritis, unspecified site: Secondary | ICD-10-CM

## 2020-10-21 DIAGNOSIS — I4891 Unspecified atrial fibrillation: Secondary | ICD-10-CM

## 2020-10-21 DIAGNOSIS — R7303 Prediabetes: Secondary | ICD-10-CM

## 2020-10-21 DIAGNOSIS — I255 Ischemic cardiomyopathy: Secondary | ICD-10-CM

## 2020-10-21 DIAGNOSIS — E785 Hyperlipidemia, unspecified: Secondary | ICD-10-CM

## 2020-10-21 DIAGNOSIS — T148XXA Other injury of unspecified body region, initial encounter: Secondary | ICD-10-CM

## 2020-10-21 DIAGNOSIS — Z95810 Presence of automatic (implantable) cardiac defibrillator: Secondary | ICD-10-CM

## 2020-10-21 DIAGNOSIS — J449 Chronic obstructive pulmonary disease, unspecified: Secondary | ICD-10-CM

## 2020-10-21 DIAGNOSIS — R053 Chronic cough: Secondary | ICD-10-CM

## 2020-10-21 DIAGNOSIS — R269 Unspecified abnormalities of gait and mobility: Secondary | ICD-10-CM

## 2020-10-21 DIAGNOSIS — M549 Dorsalgia, unspecified: Secondary | ICD-10-CM

## 2020-10-21 DIAGNOSIS — I219 Acute myocardial infarction, unspecified: Secondary | ICD-10-CM

## 2020-10-21 DIAGNOSIS — I509 Heart failure, unspecified: Secondary | ICD-10-CM

## 2020-10-21 DIAGNOSIS — N529 Male erectile dysfunction, unspecified: Secondary | ICD-10-CM

## 2020-10-21 DIAGNOSIS — J302 Other seasonal allergic rhinitis: Secondary | ICD-10-CM

## 2020-10-21 DIAGNOSIS — I10 Essential (primary) hypertension: Secondary | ICD-10-CM

## 2020-10-21 DIAGNOSIS — G4733 Obstructive sleep apnea (adult) (pediatric): Secondary | ICD-10-CM

## 2020-10-21 DIAGNOSIS — I2510 Atherosclerotic heart disease of native coronary artery without angina pectoris: Secondary | ICD-10-CM

## 2020-10-21 DIAGNOSIS — K219 Gastro-esophageal reflux disease without esophagitis: Secondary | ICD-10-CM

## 2020-10-21 DIAGNOSIS — E669 Obesity, unspecified: Secondary | ICD-10-CM

## 2020-10-21 DIAGNOSIS — G252 Other specified forms of tremor: Secondary | ICD-10-CM

## 2020-10-21 DIAGNOSIS — K449 Diaphragmatic hernia without obstruction or gangrene: Secondary | ICD-10-CM

## 2020-10-21 DIAGNOSIS — I340 Nonrheumatic mitral (valve) insufficiency: Secondary | ICD-10-CM

## 2020-10-21 DIAGNOSIS — M48061 Spinal stenosis, lumbar region without neurogenic claudication: Secondary | ICD-10-CM

## 2020-10-21 NOTE — Patient Instructions
-   Continue to monitor tremor, if getting worse notify us    - follow fall precautions     - Referral to spine clinic as he would like opinion on spinal cord stimulator     - notify us for any concerns    - call [phone]    - Follow up in 6 months or early if needed

## 2020-10-21 NOTE — Progress Notes
Reason for visit: [name] is 75 y.o. right handed male who presents today for evaluation of tremors. Referred by Dr Johnny [name].   Accompanied by: Wife  History obtained from patient and reviewed prior records.     History: He started noticing tremor around 20 years ago. Wife remembers noticing tremor when he hold telephone. Tremor has slightly worsened over time. He now notice tremor in both hands, tremor is intermittent. Tremor occurs with activities and also at rest. He denies tremor in head, voice or legs.   He feels steady to most extent, sometimes feels unsteady. He use a cane to walk. Has history of bilateral knee replacement and chronic lower back pain. He is being considered for spinal cord stimulator and he would like to get another opinion.   He denies dysphagia, speech changes.     He has word finding difficulty. Reports mild issues with short term memory.    He rarely report yelling in sleep. He denies hallucinations, denies constipation. He has impaired sense of smell for several years.   He denies dizziness on standing up.     He has history of CAD, COPD, atrial fibrillation, s/p ICD, HTN, OSA, neuropathy.       Medications:   Reviewed in patient?s electronic medical record   Current Outpatient Medications   Medication Sig Dispense Refill   ? acetaminophen (TYLENOL) 325 mg tablet Take 2 Tabs by mouth every 4 hours as needed for Pain. 300 Tab 1   ? albuterol (VENTOLIN HFA, PROAIR HFA) 90 mcg/actuation inhaler Inhale 2 Puffs by mouth four times daily as needed for Wheezing.     ? aspirin 81 mg chewable tablet Take 1 Tab by mouth daily. WAIT to restart until finished taking lovenox injections. (Patient taking differently: Chew 81 mg by mouth at bedtime daily.) 90 Tab 3   ? cetirizine (ZYRTEC) 10 mg tablet Take 10 mg by mouth daily.     ? cholecalciferol (Vitamin D3) (VITAMIN D-3) 1,000 units tablet Take 1,000 Units by mouth twice daily.       ? dextromethorphan/guaiFENesin (MUCINEX DM) 30/600 mg Tb12 Take 2 tablets by mouth daily as needed.     ? docusate (COLACE) 100 mg capsule Take 200 mg by mouth at bedtime daily. Pt is taking 550 mg daily     ? ezetimibe (ZETIA) 10 mg tablet Take one tablet by mouth at bedtime daily. 90 tablet 3   ? famotidine (PEPCID) 40 mg tablet Take 40 mg by mouth at bedtime daily.     ? fexofenadine(+) (ALLEGRA) 180 mg tablet Take 180 mg by mouth daily.     ? finasteride (PROSCAR) 5 mg tablet Take 5 mg by mouth daily.     ? fluticasone-umeclidin-vilanter (TRELEGY ELLIPTA) 100-62.5-25 mcg dsdv Inhale 1 Dose by mouth into the lungs at bedtime daily.     ? furosemide (LASIX) 40 mg tablet TAKE 2 TABLETS ONCE DAILY, TAKE 3 TABLETS ON Monday/WEDNESDAY/FRIDAY AS DIRECTED BY       CARDIOLOGY 270 tablet 3   ? gabapentin (NEURONTIN) 300 mg capsule Take 300 mg by mouth every 6 hours.     ? glucosamine su 2KCl-chondroit 500-400 mg tab Take 1 Tab by mouth twice daily.     ? ipratropium bromide (ATROVENT) 42 mcg (0.06 %) nasal spray Apply 2 sprays to each nostril as directed twice daily as needed.     ? losartan (COZAAR) 50 mg tablet TAKE 1 TABLET DAILY AT BEDTIME 90 tablet 3   ?  metoprolol XL (TOPROL XL) 100 mg extended release tablet Take one tablet by mouth daily. 90 tablet 3   ? mucus clearing device (AEROBIKA OSCILLATING PEP SYSTM MISC) Use  as directed. Use with salt water and inhale twice daily     ? MYRBETRIQ 50 mg tablet Take 1 tablet by mouth daily.     ? OMEGA-3 FATTY ACIDS-FISH OIL PO Take 2 capsules by mouth every morning, then 3 capsules every evening     ? other medication mupirocin ointment and budesonide solution mixed with warm water  Use as nasal irrigation twice daily     ? oxybutynin chloride (DITROPAN) 5 mg tablet Take 5 mg by mouth twice daily.     ? pantoprazole DR (PROTONIX) 40 mg tablet Take 40 mg by mouth twice daily.     ? polyethylene glycol 3350 (GLYCOLAX; MIRALAX) 17 gram/dose powder Take 17 g by mouth daily. (Patient taking differently: Take 17 g by mouth nightly as needed.) 595 g 3   ? potassium chloride SR (K-DUR) 20 mEq tablet Take 1 tablet by mouth daily. Take with a meal and a full glass of water.     ? predniSONE (DELTASONE) 10 mg tablet TAKE TWO TABLETS BY MOUTH TWICE DAILY FOR THREE DAYS. THEN take TWO tablets ONCE DAILY FOR THREE DAYS. THEN take ONE tablet DAILY FOR THREE DAYS     ? rosuvastatin (CRESTOR) 40 mg tablet Take one tablet by mouth at bedtime daily. 90 tablet 3   ? sildenafil(+) (VIAGRA) 100 mg tablet Take 1 Tab by mouth as Needed for Erectile dysfunction. 6 Tab 12   ? spironolactone (ALDACTONE) 25 mg tablet Take one-half tablet by mouth daily. Take with food. (Patient taking differently: Take 25 mg by mouth daily. Take with food. Take one tablet daily.) 45 tablet 3   ? tamsulosin (FLOMAX) 0.4 mg capsule Take 0.4 mg by mouth daily. Do not crush, chew or open capsules. Take 30 minutes following the same meal each day.     ? testosterone cypionate 200 mg/mL kit Inject 1 mL into the muscle every 14 days.     ? Vacuum Erection Device System kit Use as directed for sexual activity. 1 kit 11   ? XARELTO 20 mg tablet TAKE 1 TABLET BY MOUTH DAILY - MUST BE WITH EVENING MEAL       No current facility-administered medications for this visit.     Past Medical History:    Medical History:   Diagnosis Date   ? Atrial fibrillation (HCC) 02/08/2009   ? Back pain    ? CAD (coronary artery disease) 02/08/2009   ? Carpal tunnel syndrome    ? Chronic cough    ? Congestive heart disease (HCC)    ? COPD (chronic obstructive pulmonary disease) (HCC)    ? Erectile dysfunction, vasculogenic    ? Fracture    ? GERD (gastroesophageal reflux disease)     controlled with elevated HOB, protonix and pepcid    ? Hiatal hernia    ? Hyperlipemia 02/11/2009   ? Hypertension 02/11/2009   ? 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 Mitral regurgitation    ? Myocardial infarction [facility]) 2006   ? Obesity, Class I, 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)    ? OSA on CPAP 02/11/2009   ? Osteoarthritis    ? Pre-diabetes    ? S/P ICD (internal cardiac defibrillator) procedure 02/11/2009   ? Seasonal allergies  Social History:  No smoking, no alcohol, no drugs.     He is retired. Lives with his wife.    Family History:   No family history of tremors    Allergies:   Allergies   Allergen Reactions   ? Clarithromycin RASH     Allergy recorded in SMS: Biaxin~Reactions: RASH~THRUSH   ? Ketoconazole SEE COMMENTS     Had bloodshot eyes after use, and sore.   ? Lisinopril COUGH         Review of systems:   Neurological: Positive for tremors.   All other systems reviewed and are negative.  ?    Exam:      VITAL SIGNS:   Vitals:    10/21/20 0918   BP: 125/75   Pulse: 85       GENERAL:  well developed, NAD  CV:   RRR  Extremities:       Wears compression stockings     NEUROLOGICAL EXAM:     MS: Alert and oriented x 3, fluent speech, comprehension intact,normal attention, adequate fund of knowledge  CN: PERRL, EOMI, VFFTC, funduscopic exam - unable to visualize disc , sensation intact over face to light touch bilaterally, face symmetric, hearing intact to finger rubs bilaterally, uvula/palate   elevates symmetrically, tongue midline, shoulder shrug symmetric  MOTOR:  Tone - normal in UE and LE,  Strength - 5/5 throughout in UE and LE  Tremor: There is no rest tremor, slight action tremor bilaterally   No bradykinesia with finger tapping, RAM  Dyskinesia: none  SENSATION: Intact to light touch, pinprick , proprioception and vibration   COORDINATION: Intact to FTN in UE bilaterally, LE intact to HTS BL  REFLEXES: diminished throughout, toes mute bilaterally   GAIT: Able to stand up without difficulty, walked slowly, equal arm swing, no tremor when walking, antalgic gait          Assessment/Plan:    [name] presents today for evaluation of tremors. He has tremor for last 20 years, tremor fluctuates and today is good day for him. On exam no rest or action tremor was present. There is no rigidity, bradykinesia on evaluation. Based on evaluation differential of tremor include essential tremor, based on clinical course and exam findings suspicion for  parkinsonism is low. Tremor is not bothersome to him, recommend monitor tremor. If tremor is getting worse trial of medications can be considered. He is already on beta blocker.     Gait difficulty - I suspect gait difficulty is multifactorial with lumbar stenosis, chronic back pain. He is interested in getting another opinion regarding back pain and to discuss about spinal cord stimulator. Will place a referral to spine clinic.     - Continue to monitor tremor, if getting worse notify us  - follow fall precautions   - Referral to spine clinic as he would like opinion on spinal cord stimulator   - notify us for any concerns  - call [phone]  - Follow up in 6 months or early if needed     Total cumulative time spent 60 mins including review of records, obtaining history, performing medical examination, counseling and educating patient,caregiver and documenting clinical information in the [facility] medical record.  Referring to other health care professional.         Please note that documentation of records were done during a busy neurological clinic. Attempts have been made to review the document for any errors. Please excuse for brevity  and typographical errors.     Ulyses Jarred, MD  [facility]

## 2020-10-22 DIAGNOSIS — I480 Paroxysmal atrial fibrillation: Secondary | ICD-10-CM

## 2020-11-03 ENCOUNTER — Encounter

## 2020-11-03 DIAGNOSIS — I480 Paroxysmal atrial fibrillation: Secondary | ICD-10-CM

## 2020-11-03 NOTE — Telephone Encounter
-----   Message from Golda Acre, LPN sent at 0/05/2329  2:52 PM CST -----  Regarding: RCP- lab orders  VM on triage line from patient.  Said that he was to get BMP done, then got covid so went to [facility] today to have done but they did not have orders.  Could we get orders to [facility] and call him at #[phone].

## 2020-11-03 NOTE — Telephone Encounter
Patient was supposed to get these labs done when he had elevated optivol. See note from [facility] RN on 12/27. Patient said he got covid then just forgot. He thinks his potassium is likely low. Wants to have labs drawn now. Labs ordered and set to [facility] hospital at his request. Patient will follow up with Dr. Hale Bogus team per [facility]. Reviewed plan with the patient. Patient verbalized understanding and does not have any further questions or concerns. No further education requested from patient. Patient has our contact information for future needs.

## 2020-11-05 ENCOUNTER — Encounter

## 2020-11-05 DIAGNOSIS — I480 Paroxysmal atrial fibrillation: Secondary | ICD-10-CM

## 2020-11-05 LAB — BASIC METABOLIC PANEL
Lab: 1.2
Lab: 102
Lab: 116 — ABNORMAL HIGH (ref 70–105)
Lab: 140
Lab: 24
Lab: 25
Lab: 4.4
Lab: 8.6 — ABNORMAL LOW (ref 8.8–10.0)

## 2020-11-06 ENCOUNTER — Encounter

## 2020-11-06 DIAGNOSIS — M5137 Other intervertebral disc degeneration, lumbosacral region: Secondary | ICD-10-CM

## 2020-11-06 DIAGNOSIS — G5600 Carpal tunnel syndrome, unspecified upper limb: Secondary | ICD-10-CM

## 2020-11-06 DIAGNOSIS — R053 Chronic cough: Secondary | ICD-10-CM

## 2020-11-06 DIAGNOSIS — I10 Essential (primary) hypertension: Secondary | ICD-10-CM

## 2020-11-06 DIAGNOSIS — I255 Ischemic cardiomyopathy: Secondary | ICD-10-CM

## 2020-11-06 DIAGNOSIS — E669 Obesity, unspecified: Secondary | ICD-10-CM

## 2020-11-06 DIAGNOSIS — J449 Chronic obstructive pulmonary disease, unspecified: Secondary | ICD-10-CM

## 2020-11-06 DIAGNOSIS — G4733 Obstructive sleep apnea (adult) (pediatric): Secondary | ICD-10-CM

## 2020-11-06 DIAGNOSIS — K449 Diaphragmatic hernia without obstruction or gangrene: Secondary | ICD-10-CM

## 2020-11-06 DIAGNOSIS — I4891 Unspecified atrial fibrillation: Secondary | ICD-10-CM

## 2020-11-06 DIAGNOSIS — I509 Heart failure, unspecified: Secondary | ICD-10-CM

## 2020-11-06 DIAGNOSIS — J302 Other seasonal allergic rhinitis: Secondary | ICD-10-CM

## 2020-11-06 DIAGNOSIS — I340 Nonrheumatic mitral (valve) insufficiency: Secondary | ICD-10-CM

## 2020-11-06 DIAGNOSIS — R7303 Prediabetes: Secondary | ICD-10-CM

## 2020-11-06 DIAGNOSIS — K219 Gastro-esophageal reflux disease without esophagitis: Secondary | ICD-10-CM

## 2020-11-06 DIAGNOSIS — Z95810 Presence of automatic (implantable) cardiac defibrillator: Secondary | ICD-10-CM

## 2020-11-06 DIAGNOSIS — M47817 Spondylosis without myelopathy or radiculopathy, lumbosacral region: Secondary | ICD-10-CM

## 2020-11-06 DIAGNOSIS — M47819 Spondylosis without myelopathy or radiculopathy, site unspecified: Secondary | ICD-10-CM

## 2020-11-06 DIAGNOSIS — I2510 Atherosclerotic heart disease of native coronary artery without angina pectoris: Secondary | ICD-10-CM

## 2020-11-06 DIAGNOSIS — T148XXA Other injury of unspecified body region, initial encounter: Secondary | ICD-10-CM

## 2020-11-06 DIAGNOSIS — M62838 Other muscle spasm: Secondary | ICD-10-CM

## 2020-11-06 DIAGNOSIS — M549 Dorsalgia, unspecified: Secondary | ICD-10-CM

## 2020-11-06 DIAGNOSIS — M1990 Unspecified osteoarthritis, unspecified site: Secondary | ICD-10-CM

## 2020-11-06 DIAGNOSIS — N529 Male erectile dysfunction, unspecified: Secondary | ICD-10-CM

## 2020-11-06 DIAGNOSIS — I219 Acute myocardial infarction, unspecified: Secondary | ICD-10-CM

## 2020-11-06 DIAGNOSIS — E785 Hyperlipidemia, unspecified: Secondary | ICD-10-CM

## 2020-11-06 MED ORDER — TIZANIDINE 2 MG PO TAB
4 mg | ORAL_TABLET | Freq: Every evening | ORAL | 2 refills | Status: AC | PRN
Start: 2020-11-06 — End: ?

## 2020-11-06 NOTE — Progress Notes
Dear Dr. Jamal Maes,    I appreciate your kind referral of [facility] for evaluation of pain. Please see my note below for the full details of the evaluation and management plan.    Thank you,    Evelina Bucy, MD        [facility]  NEW PATIENT HISTORY AND PHYSICAL  Subjective     Chief Complaint:   Chief Complaint   Patient presents with   ? Lower Back - Pain       HPI: Johnny [name] is a 75 y.o. 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 who presents for evaluation.    The pain is in the    low back in a band across.   There is pain into the buttocks.     He used to have radicular pain in the LEs, but that has resolved since surgery in 2008.     He saw Dr. Shona Needles who brought up SCS.   Patient wanted to consider RFA, but did not end up proceeding with that there.     His pain is axial.   No radicular LE pain.     Positive shopping cart sign.   No claudicant symptoms.     No pain in the buttocks into the groin.     Pain started:    Decades, worse more recently.     Initial inciting injury or event:    No    Numbness/tingling:    No    The pain averages    7-8/10    The pain is described as    achy    The pain is exacerbated by      walking, bending, lifting    The pain is partially alleviated by      laying down, rest    +muscle stiffness/tightness         PRIOR MEDICATIONS:   Effective  Acetaminophen (little)    Ineffective  Gabapentin    Unable to tolerate  NSAID    Never  Lyrica  Ami/Nortriptyline  Cymbalta  Tizanidine      PRIOR INTERVENTIONS:  L-spine surgery with hardware L2-3 L3-4 and then later L4-5 and L5-1 next (2 surgeries, most recent in 2008)  Effective  Bilateral SIJ (OSH, good benefit)    Ineffective  ESI x3 (OSH)        Renne Musca Maiorana denies any recent fevers, chills, infection, antibiotics, bowel or bladder incontinence, saddle anesthesia. +Xarelto for a. fib.      ROS: All 14 systems reviewed and found to be negative except as above and as follows. +fatigue, joint pains.    Past Medical History:  Medical History:   Diagnosis Date   ? Atrial fibrillation (HCC) 02/08/2009   ? Back pain    ? CAD (coronary artery disease) 02/08/2009   ? Carpal tunnel syndrome    ? Chronic cough    ? Congestive heart disease (HCC)    ? COPD (chronic  obstructive pulmonary disease) (HCC)    ? Erectile dysfunction, vasculogenic    ? Fracture    ? GERD (gastroesophageal reflux disease)     controlled with elevated HOB, protonix and pepcid    ? Hiatal hernia    ? Hyperlipemia 02/11/2009   ? Hypertension 02/11/2009   ? 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 Mitral regurgitation    ? Myocardial infarction [facility]) 2006   ? Obesity, Class I, 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)    ? OSA on CPAP 02/11/2009   ? Osteoarthritis    ? Pre-diabetes    ? S/P ICD (internal cardiac defibrillator) procedure 02/11/2009   ? Seasonal allergies        Family History:  Family History   Problem Relation Age of Onset   ? Cancer Father    ? Hypertension Mother        Social History:  [place]ives in [place] [id number] (1.25 hours away)    Social History     Socioeconomic History   ? Marital status: Married     Spouse name: Not on file   ? Number of children: Not on file   ? Years of education: Not on file   ? Highest education level: Not on file   Occupational History   ? Not on file   Tobacco Use   ? Smoking status: Never Smoker   ? Smokeless tobacco: Never Used   Vaping Use   ? Vaping Use: Never used   Substance and Sexual Activity   ? Alcohol use: No     Alcohol/week: 0.0 standard drinks     Comment: very rarely 4 drinks per year   ? Drug use: No   ? Sexual activity: Not on file   Other Topics Concern   ? Not on file   Social History Narrative   ? Not on file       Allergies:  Allergies   Allergen Reactions   ? Clarithromycin RASH     Allergy recorded in SMS: Biaxin~Reactions: RASH~THRUSH   ? Ketoconazole SEE COMMENTS     Had bloodshot eyes after use, and sore.   ? Lisinopril COUGH       Medications:    Current Outpatient Medications:   ?  acetaminophen (TYLENOL) 325 mg tablet, Take 2 Tabs by mouth every 4 hours as needed for Pain., Disp: 300 Tab, Rfl: 1  ?  albuterol (VENTOLIN HFA, PROAIR HFA) 90 mcg/actuation inhaler, Inhale 2 Puffs by mouth four times daily as needed for Wheezing., Disp: , Rfl:   ?  aspirin 81 mg chewable tablet, Take 1 Tab by mouth daily. WAIT to restart until finished taking lovenox injections. (Patient taking differently: Chew 81 mg by mouth at bedtime daily.), Disp: 90 Tab, Rfl: 3  ?  cetirizine (ZYRTEC) 10 mg tablet, Take 10 mg by mouth daily., Disp: , Rfl:   ?  cholecalciferol (Vitamin D3) (VITAMIN D-3) 1,000 units tablet, Take 1,000 Units by mouth twice daily.  , Disp: , Rfl:   ?  dextromethorphan/guaiFENesin (MUCINEX DM) 30/600 mg Tb12, Take 2 tablets by mouth daily as needed., Disp: , Rfl:   ?  docusate (COLACE) 100 mg capsule, Take 200 mg by mouth at bedtime daily. Pt is taking 550 mg daily, Disp: , Rfl:   ?  ezetimibe (ZETIA) 10 mg tablet, Take one tablet by mouth at bedtime daily., Disp: 90 tablet, Rfl: 3  ?  famotidine (  PEPCID) 40 mg tablet, Take 40 mg by mouth at bedtime daily., Disp: , Rfl:   ?  fexofenadine(+) (ALLEGRA) 180 mg tablet, Take 180 mg by mouth daily., Disp: , Rfl:   ?  finasteride (PROSCAR) 5 mg tablet, Take 5 mg by mouth daily., Disp: , Rfl:   ?  fluticasone-umeclidin-vilanter (TRELEGY ELLIPTA) 100-62.5-25 mcg dsdv, Inhale 1 Dose by mouth into the lungs at bedtime daily., Disp: , Rfl:   ?  furosemide (LASIX) 40 mg tablet, TAKE 2 TABLETS ONCE DAILY, TAKE 3 TABLETS ON Monday/WEDNESDAY/FRIDAY AS DIRECTED BY       CARDIOLOGY, Disp: 270 tablet, Rfl: 3  ?  gabapentin (NEURONTIN) 300 mg capsule, Take 300 mg by mouth every 6 hours., Disp: , Rfl:   ?  glucosamine su 2KCl-chondroit 500-400 mg tab, Take 1 Tab by mouth twice daily., Disp: , Rfl:   ?  ipratropium bromide (ATROVENT) 42 mcg (0.06 %) nasal spray, Apply 2 sprays to each nostril as directed twice daily as needed., Disp: , Rfl:   ?  losartan (COZAAR) 50 mg tablet, TAKE 1 TABLET DAILY AT BEDTIME, Disp: 90 tablet, Rfl: 3  ?  metoprolol XL (TOPROL XL) 100 mg extended release tablet, Take one tablet by mouth daily., Disp: 90 tablet, Rfl: 3  ?  mucus clearing device (AEROBIKA OSCILLATING PEP SYSTM MISC), Use  as directed. Use with salt water and inhale twice daily, Disp: , Rfl:   ?  MYRBETRIQ 50 mg tablet, Take 1 tablet by mouth daily., Disp: , Rfl:   ?  OMEGA-3 FATTY ACIDS-FISH OIL PO, Take 2 capsules by mouth every morning, then 3 capsules every evening, Disp: , Rfl:   ?  other medication, mupirocin ointment and budesonide solution mixed with warm water Use as nasal irrigation twice daily, Disp: , Rfl:   ?  oxybutynin chloride (DITROPAN) 5 mg tablet, Take 5 mg by mouth twice daily., Disp: , Rfl:   ?  pantoprazole DR (PROTONIX) 40 mg tablet, Take 40 mg by mouth twice daily., Disp: , Rfl:   ?  polyethylene glycol 3350 (GLYCOLAX; MIRALAX) 17 gram/dose powder, Take 17 g by mouth daily. (Patient taking differently: Take 17 g by mouth nightly as needed.), Disp: 595 g, Rfl: 3  ?  potassium chloride SR (K-DUR) 20 mEq tablet, Take 1 tablet by mouth daily. Take with a meal and a full glass of water., Disp: , Rfl:   ?  rosuvastatin (CRESTOR) 40 mg tablet, Take one tablet by mouth at bedtime daily., Disp: 90 tablet, Rfl: 3  ?  sildenafil(+) (VIAGRA) 100 mg tablet, Take 1 Tab by mouth as Needed for Erectile dysfunction., Disp: 6 Tab, Rfl: 12  ?  spironolactone (ALDACTONE) 25 mg tablet, Take one-half tablet by mouth daily. Take with food. (Patient taking differently: Take 25 mg by mouth daily. Take with food. Take one tablet daily.), Disp: 45 tablet, Rfl: 3  ?  tamsulosin (FLOMAX) 0.4 mg capsule, Take 0.4 mg by mouth daily. Do not crush, chew or open capsules. Take 30 minutes following the same meal each day., Disp: , Rfl:   ?  testosterone cypionate 200 mg/mL kit, Inject 1 mL into the muscle every 14 days., Disp: , Rfl:   ?  traMADoL (ULTRAM) 50 mg tablet, every 4-6 hours, Disp: , Rfl:   ?  traZODone (DESYREL) 100 mg tablet, bedtime, Disp: , Rfl:   ?  Vacuum Erection Device System kit, Use as directed for sexual activity., Disp: 1 kit, Rfl: 11  ?  XARELTO 20 mg tablet, TAKE 1 TABLET BY MOUTH DAILY - MUST BE WITH EVENING MEAL, Disp: , Rfl:     Physical examination:   BP 118/70 (BP Source: Arm, Left Upper, Patient Position: Sitting)  - Pulse 84  - Temp 36.7 ?C (98.1 ?F) (Oral)  - Ht 182.9 cm (6')  - Wt 108.4 kg (239 lb)  - SpO2 95%  - BMI 32.41 kg/m?   Pain Score: Six    General: The patient is a well-developed, well nourished 75 y.o. male in no acute distress.   HEENT: Head is normocephalic and atraumatic. EOMI bilaterally.   Cardiac: Based on palpation, pulse appears to be regular rate and rhythm.   Pulmonary: The patient has unlabored respirations and bilateral symmetric chest excursion.   Abdomen: Soft, nontender, and nondistended. There is no rebound or guarding.   Extremities: No clubbing, cyanosis, or edema.     Neurologic:   The patient is alert and oriented times 3.   Cranial nerves II through XII are intact without any focal deficits.     Musculoskeletal:   Walks with cane. L-Spine   There is mild low lumbar paraspinal tenderness. Paraspinal muscle tone is increased.  Facet loading is positive.  ROM with flexion, extension, rotation, and lateral bending is intact.  Strength is equal and adequate bilaterally in the flexors and extensors of the bilateral lower extremities.   SLR is negative bilaterally.         Results for orders placed during the hospital encounter of 08/24/20    MRI L-SPINE WO/W CONTRAST    Addendum 08/25/2020  5:39 PM  Finalized by Ivory Broad, M.D. on 08/24/2020 5:00 PM. Dictated by Delfin Gant, M.D. on 08/24/2020 3:54 PM.Addendum:  Delfin Gant discussed these findings via telephone with Dr. Francee Nodal nurse, Vassie Moment, at 8:34 AM on 08/25/2020.      Approved by Delfin Gant, M.D. on 08/25/2020 8:35 AM    By my electronic signature, I attest that I have personally reviewed the images for this examination and formulated the interpretations and opinions expressed in this report      Finalized by Ivory Broad, M.D. on 08/25/2020 5:36 PM. Dictated by Delfin Gant, M.D. on 08/25/2020 8:33 AM.    Narrative  EXAM: MRI L-SPINE    HISTORY:    , lumbar radiculapathy,    Technique: Multiple sagittal and axial MR sequences were obtained of the lumbar spine with and without MultiHance contrast.    Comparison: CT lumbar spine August 27, 2014    FINDINGS:    There are 5 nonrib-bearing lumbar type vertebral bodies. There is left convexity lumbar spinal curvature. Trace retrolisthesis at L2-L3 and trace anterolisthesis of L5-S1. X-Stop devices are seen at the spinous processes of the lumbar spine. Vertebral body heights are maintained. Multilevel vertebral endplate marrow changes. No suspicious bone marrow replacing lesion. The conus is normal in appearance and position at the T12-L1 level. abnormal enhancing lesion is identified. Bilateral renal atrophy. Small upper pole right renal cyst. There is an additional 1.3 cm upper pole right renal lesion which demonstrates T2 and T1 hypointensity (series 5, image 8).    T12-L1: No significant central spinal or neuroforaminal stenosis.    L1-L2: Marked disc degeneration, mild circumferential disc bulge, and facet hypertrophy. Mild central spinal stenosis. Moderate left and marked right neural foraminal stenosis.    L2-L3: Trace retrolisthesis, moderate disc degeneration, circumferential disc bulge, facet hypertrophy, and mild malignant flavum thickening. Mild central spinal stenosis. Moderate left and marked  right neuroforaminal stenosis.    L3-L4: Moderate disc degeneration, circumferential disc bulge, and facet hypertrophy. Moderate to marked spinal stenosis. Marked bilateral neuroforaminal stenosis.    L4-L5: Mild disc degeneration, circumferential disc bulge with superimposed central disc protrusion, ligamentum flavum thickening, and facet hypertrophy. Moderate to marked central spinal stenosis. Marked bilateral neural foraminal stenosis.    L5-S1: Mild disc degeneration, circumferential disc bulge, and facet hypertrophy. Moderate central spinal stenosis. Marked bilateral neural foraminal stenosis.    Impression  1.  Multilevel degenerative central spinal stenosis, greatest of moderate to marked degree at L3-L4, L4-L5, and L5-S1.  2.  Multilevel degenerative neuroforaminal stenosis, greatest of marked degree on the right at L1-L2 and L2-L3 as well as bilaterally at L3-L4, L4-5, and L5-S1.  3.  Left convexity lumbar spinal curvature.  4.  Trace retrolisthesis of L2-L3 and trace anterolisthesis of L5-S1.  5.  T2 and T1 hypointense peripheral right renal lesion. MRI abdomen (renal mass protocol) is recommended for further evaluation.    By my electronic signature, I attest that I have personally reviewed the images for this examination and formulated the interpretations and opinions expressed in this report          Last Cr and LFT's:  Creatinine   Date Value Ref Range Status   11/05/2020 1.21  Final     AST (SGOT)   Date Value Ref Range Status   11/22/2018 25 7 - 40 U/L Final     ALT (SGPT)   Date Value Ref Range Status   11/22/2018 24 7 - 56 U/L Final     Alk Phosphatase   Date Value Ref Range Status   11/22/2018 51 25 - 110 U/L Final     Total Bilirubin   Date Value Ref Range Status   11/22/2018 0.8 0.3 - 1.2 MG/DL Final          Assessment:    Johnny [name] is a 75 y.o. 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 who presents for evaluation of pain.    The pain complaints are most likely due to:    1. Spondylosis of lumbosacral region without myelopathy or radiculopathy  [place] [facility] INJECT MBB LUMB/SACRAL   2. DDD (degenerative disc disease), lumbosacral  [place] [facility] SPINE INJECT MBB LUMB/SACRAL   3. Facet arthropathy  [place] [facility] SPINE INJECT MBB LUMB/SACRAL   4. Muscle spasm         Patient has had an adequate trial of > 3 months of rest, exercise, multimodal treatment, and the passage of time without improvement of symptoms. The pain has significant impact on the daily quality of life.     Plan:    1. Plan for Bilateral L4-S1 MBB x2 in Procedures at first available appointment.   2. If he fails to improve, could consider diagnostic SIJ injections in the future.   3. We also discussed SCS trial with Nevro/HFX as a possibility.   4. Will trial tizanidine 2-4mg  qhs prn.   5. Follow up as  needed.     Risks/benefits of all pharmacologic and interventional treatments discussed and questions answered.     Thank you for this kind referral for consultation. Please feel free to contact me with any questions or concerns.

## 2020-11-09 NOTE — Telephone Encounter
Pt called in to see if device connected. On [facility] site showing connected and next scheduled send is not unitol 12/14/2020. LM on pt cell after reviewing and called back.

## 2020-11-10 ENCOUNTER — Encounter: Admit: 2020-11-10 | Discharge: 2020-11-10 | Payer: MEDICARE

## 2020-11-10 NOTE — Telephone Encounter
Spoke with Johnny [name].  He states he tried sending [place] remote yesterday and today and he keeps getting yellow flashing lights on his remote monitor  Last rec'd is 10/12/20  The website does show he is contected, but we have not rec'd any recent remotes on him  Gave him MDT support number.

## 2020-11-13 ENCOUNTER — Encounter: Admit: 2020-11-13 | Discharge: 2020-11-13 | Payer: MEDICARE

## 2020-11-13 MED ORDER — POTASSIUM CHLORIDE 20 MEQ PO TBTQ
ORAL_TABLET | 3 refills | 30.00000 days | Status: AC
Start: 2020-11-13 — End: ?

## 2020-11-17 ENCOUNTER — Ambulatory Visit: Admit: 2020-11-17 | Discharge: 2020-11-17 | Payer: MEDICARE

## 2020-11-17 ENCOUNTER — Encounter: Admit: 2020-11-17 | Discharge: 2020-11-17 | Payer: MEDICARE

## 2020-11-17 DIAGNOSIS — Z95810 Presence of automatic (implantable) cardiac defibrillator: Secondary | ICD-10-CM

## 2020-11-17 DIAGNOSIS — N529 Male erectile dysfunction, unspecified: Secondary | ICD-10-CM

## 2020-11-17 DIAGNOSIS — E785 Hyperlipidemia, unspecified: Secondary | ICD-10-CM

## 2020-11-17 DIAGNOSIS — I4891 Unspecified atrial fibrillation: Secondary | ICD-10-CM

## 2020-11-17 DIAGNOSIS — M1990 Unspecified osteoarthritis, unspecified site: Secondary | ICD-10-CM

## 2020-11-17 DIAGNOSIS — I255 Ischemic cardiomyopathy: Secondary | ICD-10-CM

## 2020-11-17 DIAGNOSIS — M47819 Spondylosis without myelopathy or radiculopathy, site unspecified: Secondary | ICD-10-CM

## 2020-11-17 DIAGNOSIS — E669 Obesity, unspecified: Secondary | ICD-10-CM

## 2020-11-17 DIAGNOSIS — T148XXA Other injury of unspecified body region, initial encounter: Secondary | ICD-10-CM

## 2020-11-17 DIAGNOSIS — G4733 Obstructive sleep apnea (adult) (pediatric): Secondary | ICD-10-CM

## 2020-11-17 DIAGNOSIS — I10 Essential (primary) hypertension: Secondary | ICD-10-CM

## 2020-11-17 DIAGNOSIS — J449 Chronic obstructive pulmonary disease, unspecified: Secondary | ICD-10-CM

## 2020-11-17 DIAGNOSIS — I509 Heart failure, unspecified: Secondary | ICD-10-CM

## 2020-11-17 DIAGNOSIS — R7303 Prediabetes: Secondary | ICD-10-CM

## 2020-11-17 DIAGNOSIS — I2510 Atherosclerotic heart disease of native coronary artery without angina pectoris: Secondary | ICD-10-CM

## 2020-11-17 DIAGNOSIS — M5137 Other intervertebral disc degeneration, lumbosacral region: Secondary | ICD-10-CM

## 2020-11-17 DIAGNOSIS — M47817 Spondylosis without myelopathy or radiculopathy, lumbosacral region: Secondary | ICD-10-CM

## 2020-11-17 DIAGNOSIS — G5600 Carpal tunnel syndrome, unspecified upper limb: Secondary | ICD-10-CM

## 2020-11-17 DIAGNOSIS — K219 Gastro-esophageal reflux disease without esophagitis: Secondary | ICD-10-CM

## 2020-11-17 DIAGNOSIS — R053 Chronic cough: Secondary | ICD-10-CM

## 2020-11-17 DIAGNOSIS — K449 Diaphragmatic hernia without obstruction or gangrene: Secondary | ICD-10-CM

## 2020-11-17 DIAGNOSIS — I340 Nonrheumatic mitral (valve) insufficiency: Secondary | ICD-10-CM

## 2020-11-17 DIAGNOSIS — J302 Other seasonal allergic rhinitis: Secondary | ICD-10-CM

## 2020-11-17 DIAGNOSIS — I219 Acute myocardial infarction, unspecified: Secondary | ICD-10-CM

## 2020-11-17 DIAGNOSIS — M549 Dorsalgia, unspecified: Secondary | ICD-10-CM

## 2020-11-17 MED ORDER — LIDOCAINE (PF) 10 MG/ML (1 %) IJ SOLN
2 mL | Freq: Once | INTRAMUSCULAR | 0 refills | Status: CP
Start: 2020-11-17 — End: ?

## 2020-11-17 MED ORDER — BUPIVACAINE (PF) 0.5 % (5 MG/ML) IJ SOLN
3 mL | Freq: Once | INTRAMUSCULAR | 0 refills | Status: CP
Start: 2020-11-17 — End: ?

## 2020-11-17 NOTE — Discharge Instructions - Supplementary Instructions
MEDIAL BRANCH BLOCK   DISCHARGE INSTRUCTIONS    Important information following your procedure today:     This injection is for diagnostic purposes, it is a test block. Only short-term results are expected.  Though the procedure is generally safe, and complications are rare, we do ask that you be aware of any of the following:   Any swelling, persistent redness, new bleeding, or drainage from the site of the injection.  You should not experience a severe headache.  You should not run a fever over 101? F.  New onset of sharp, severe back & or neck pain.  New onset of upper or lower extremity numbness or weakness.  New difficulty controlling bowel or bladder function after the injection.  New shortness of breath.    ** If any of these occur, please call to report this occurrence to a nurse at [phone]. If you are calling after 4:00 p.m. or on weekends or holidays, please call [phone] and ask to have the resident physician on call for the physician paged or go to your local emergency room.  You may experience soreness at the injection site. Ice can be applied at 20-minute intervals. Avoid application of direct heat, hot showers or hot tubs today.  Patients taking a daily blood thinner can resume their regular dose this evening.  It is important that you take all medications ordered by your pain physician. Taking medication as ordered is an important part of your pain care plan. If you cannot continue the medication plan, please notify the physician.   Remain active today. Do the activities that would normally cause you pain.  It is important for you to keep track of the results of this test on paper.  Did you get relief?  Percentage of relief?   How long did it last?    Call and report the results to your physician's nurse at the office number listed below. Use notes that you kept on your pain diary when giving your report. You may have to leave a message and a nurse will contact you to help you determine if you are a candidate for the Radiofrequency Ablation Procedure.    PAIN DIARY    Please report pain on 0-10 scale for each hour listed following discharge.  (0 = No pain; 5 = Moderate pain; 10 = Worst pain of your life)    TIME DAY 1 DAY 2   12AM MIDNIGHT     1AM       2AM       3AM       4AM        5AM       6AM       7AM       8AM       9AM       10AM       11AM       12PM NOON     1PM       2PM       3PM       4PM       5PM       6PM        7PM       8PM       9PM       10PM       11PM         Dr. Meredith Leeds nurse:        [phone]   Dr.  Braun's nurse:   [phone]   Dr. Ralph Dowdy nurse: [phone]   Dr. Mellody Life nurse:       [phone]   Dr. Joan Flores nurse:      [phone]   Dr. Larene Beach nurse:      [phone]   Dr. Arvil Chaco nurse:   [phone]   Dr. Michaelle Copas nurse:     [phone]   Dr. Juel Burrow nurse:  [phone]         Please contact your provider's office to report your maximal percent relief from your test blocks today and the duration of relief.    **If you did not get any relief, please also mention this as well, as this may affect the next steps in your care.       If you are unable to keep your upcoming appointment, please notify the [facility] scheduler at [phone] at least 24 hours in advance. If you have questions for the surgery center, call [facility] at [phone].

## 2020-11-17 NOTE — Progress Notes
I have examined the patient, and there are no significant changes in their condition, from the previous H&P performed on 11/06/20.     Bilateral L4-S1 MBB        [facility]  NEW PATIENT HISTORY AND PHYSICAL  Subjective     Chief Complaint:   No chief complaint on file.      HPI: Johnny [name] is a 75 y.o. 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 who presents for evaluation.    The pain is in the    low back in a band across.   There is pain into the buttocks.     He used to have radicular pain in the LEs, but that has resolved since surgery in 2008.     He saw Dr. Shona Needles who brought up SCS.   Patient wanted to consider RFA, but did not end up proceeding with that there.     His pain is axial.   No radicular LE pain.     Positive shopping cart sign.   No claudicant symptoms.     No pain in the buttocks into the groin.     Pain started:    Decades, worse more recently.     Initial inciting injury or event:    No    Numbness/tingling:    No    The pain averages    7-8/10    The pain is described as    achy    The pain is exacerbated by      walking, bending, lifting    The pain is partially alleviated by      laying down, rest    +muscle stiffness/tightness         PRIOR MEDICATIONS:   Effective  Acetaminophen (little)    Ineffective  Gabapentin    Unable to tolerate  NSAID    Never  Lyrica  Ami/Nortriptyline  Cymbalta  Tizanidine      PRIOR INTERVENTIONS:  L-spine surgery with hardware L2-3 L3-4 and then later L4-5 and L5-1 next (2 surgeries, most recent in 2008)  Effective  Bilateral SIJ (OSH, good benefit)    Ineffective  ESI x3 (OSH)        Johnny [name] denies any recent fevers, chills, infection, antibiotics, bowel or bladder incontinence, saddle anesthesia. +Xarelto for a. fib.      ROS: All 14 systems reviewed and found to be negative except as above and as follows. +fatigue, joint pains.    Past Medical History:  Medical History:   Diagnosis Date   ? Atrial fibrillation (HCC) 02/08/2009   ? Back pain    ? CAD (coronary artery disease) 02/08/2009   ? Carpal tunnel syndrome    ? Chronic cough    ? Congestive heart disease (HCC)    ? COPD (chronic obstructive pulmonary disease) (HCC)    ? Erectile dysfunction, vasculogenic    ? Fracture    ? GERD (gastroesophageal reflux disease)     controlled  with elevated HOB, protonix and pepcid    ? Hiatal hernia    ? Hyperlipemia 02/11/2009   ? Hypertension 02/11/2009   ? 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 Mitral regurgitation    ? Myocardial infarction [facility]) 2006   ? Obesity, Class I, 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)    ? OSA on CPAP 02/11/2009   ? Osteoarthritis    ? Pre-diabetes    ? S/P ICD (internal cardiac defibrillator) procedure 02/11/2009   ? Seasonal allergies        Family History:  Family History   Problem Relation Age of Onset   ? Cancer Father    ? Hypertension Mother        Social History:  [place]ives in [place] [id number] (1.25 hours away)    Social History     Socioeconomic History   ? Marital status: Married     Spouse name: Not on file   ? Number of children: Not on file   ? Years of education: Not on file   ? Highest education level: Not on file Occupational History   ? Not on file   Tobacco Use   ? Smoking status: Never Smoker   ? Smokeless tobacco: Never Used   Vaping Use   ? Vaping Use: Never used   Substance and Sexual Activity   ? Alcohol use: No     Alcohol/week: 0.0 standard drinks     Comment: very rarely 4 drinks per year   ? Drug use: No   ? Sexual activity: Not on file   Other Topics Concern   ? Not on file   Social History Narrative   ? Not on file       Allergies:  Allergies   Allergen Reactions   ? Clarithromycin RASH     Allergy recorded in SMS: Biaxin~Reactions: RASH~THRUSH   ? Ketoconazole SEE COMMENTS     Had bloodshot eyes after use, and sore.   ? Lisinopril COUGH       Medications:    Current Outpatient Medications:   ?  acetaminophen (TYLENOL) 325 mg tablet, Take 2 Tabs by mouth every 4 hours as needed for Pain., Disp: 300 Tab, Rfl: 1  ?  albuterol (VENTOLIN HFA, PROAIR HFA) 90 mcg/actuation inhaler, Inhale 2 Puffs by mouth four times daily as needed for Wheezing., Disp: , Rfl:   ?  aspirin 81 mg chewable tablet, Take 1 Tab by mouth daily. WAIT to restart until finished taking lovenox injections. (Patient taking differently: Chew 81 mg by mouth at bedtime daily.), Disp: 90 Tab, Rfl: 3  ?  cetirizine (ZYRTEC) 10 mg tablet, Take 10 mg by mouth daily., Disp: , Rfl:   ?  cholecalciferol (Vitamin D3) (VITAMIN D-3) 1,000 units tablet, Take 1,000 Units by mouth twice daily.  , Disp: , Rfl:   ?  dextromethorphan/guaiFENesin (MUCINEX DM) 30/600 mg Tb12, Take 2 tablets by mouth daily as needed., Disp: , Rfl:   ?  docusate (COLACE) 100 mg capsule, Take 200 mg by mouth at bedtime daily. Pt is taking 550 mg daily, Disp: , Rfl:   ?  ezetimibe (ZETIA) 10 mg tablet, Take one tablet by mouth at bedtime daily., Disp: 90 tablet, Rfl: 3  ?  famotidine (PEPCID) 40 mg tablet, Take 40 mg by mouth at bedtime daily., Disp: , Rfl:   ?  fexofenadine(+) (ALLEGRA) 180 mg tablet, Take 180 mg by mouth daily., Disp: ,  Rfl:   ?  finasteride (PROSCAR) 5 mg tablet, Take 5 mg by mouth daily., Disp: , Rfl:   ?  fluticasone-umeclidin-vilanter (TRELEGY ELLIPTA) 100-62.5-25 mcg dsdv, Inhale 1 Dose by mouth into the lungs at bedtime daily., Disp: , Rfl:   ?  furosemide (LASIX) 40 mg tablet, TAKE 2 TABLETS ONCE DAILY, TAKE 3 TABLETS ON Monday/WEDNESDAY/FRIDAY AS DIRECTED BY       CARDIOLOGY, Disp: 270 tablet, Rfl: 3  ?  gabapentin (NEURONTIN) 300 mg capsule, Take 300 mg by mouth every 6 hours., Disp: , Rfl:   ?  glucosamine su 2KCl-chondroit 500-400 mg tab, Take 1 Tab by mouth twice daily., Disp: , Rfl:   ?  ipratropium bromide (ATROVENT) 42 mcg (0.06 %) nasal spray, Apply 2 sprays to each nostril as directed twice daily as needed., Disp: , Rfl:   ?  losartan (COZAAR) 50 mg tablet, TAKE 1 TABLET DAILY AT BEDTIME, Disp: 90 tablet, Rfl: 3  ?  metoprolol XL (TOPROL XL) 100 mg extended release tablet, Take one tablet by mouth daily., Disp: 90 tablet, Rfl: 3  ?  mucus clearing device (AEROBIKA OSCILLATING PEP SYSTM MISC), Use  as directed. Use with salt water and inhale twice daily, Disp: , Rfl:   ?  MYRBETRIQ 50 mg tablet, Take 1 tablet by mouth daily., Disp: , Rfl:   ?  OMEGA-3 FATTY ACIDS-FISH OIL PO, Take 2 capsules by mouth every morning, then 3 capsules every evening, Disp: , Rfl:   ?  other medication, mupirocin ointment and budesonide solution mixed with warm water Use as nasal irrigation twice daily, Disp: , Rfl:   ?  oxybutynin chloride (DITROPAN) 5 mg tablet, Take 5 mg by mouth twice daily., Disp: , Rfl:   ?  pantoprazole DR (PROTONIX) 40 mg tablet, Take 40 mg by mouth twice daily., Disp: , Rfl:   ?  polyethylene glycol 3350 (GLYCOLAX; MIRALAX) 17 gram/dose powder, Take 17 g by mouth daily. (Patient taking differently: Take 17 g by mouth nightly as needed.), Disp: 595 g, Rfl: 3  ?  potassium chloride SR (K-DUR) 20 mEq tablet, Take one tab on Sunday, Tuesday, Thursday, and Saturday.  Take two tabs on Mon, Wed and Friday., Disp: 140 tablet, Rfl: 3  ?  rosuvastatin (CRESTOR) 40 mg tablet, Take one tablet by mouth at bedtime daily., Disp: 90 tablet, Rfl: 3  ?  sildenafil(+) (VIAGRA) 100 mg tablet, Take 1 Tab by mouth as Needed for Erectile dysfunction., Disp: 6 Tab, Rfl: 12  ?  spironolactone (ALDACTONE) 25 mg tablet, Take one-half tablet by mouth daily. Take with food. (Patient taking differently: Take 25 mg by mouth daily. Take with food. Take one tablet daily.), Disp: 45 tablet, Rfl: 3  ?  tamsulosin (FLOMAX) 0.4 mg capsule, Take 0.4 mg by mouth daily. Do not crush, chew or open capsules. Take 30 minutes following the same meal each day., Disp: , Rfl:   ?  testosterone cypionate 200 mg/mL kit, Inject 1 mL into the muscle every 14 days., Disp: , Rfl:   ?  tiZANidine (ZANAFLEX) 2 mg tablet, Take two tablets by mouth at bedtime as needed., Disp: 60 tablet, Rfl: 2  ?  traMADoL (ULTRAM) 50 mg tablet, every 4-6 hours, Disp: , Rfl:   ?  traZODone (DESYREL) 100 mg tablet, bedtime, Disp: , Rfl:   ?  Vacuum Erection Device System kit, Use as directed for sexual activity., Disp: 1 kit, Rfl: 11  ?  XARELTO 20 mg tablet,  TAKE 1 TABLET BY MOUTH DAILY - MUST BE WITH EVENING MEAL, Disp: , Rfl:     Current Facility-Administered Medications:   ?  bupivacaine PF (MARCAINE) 0.5 % injection 3 mL, 3 mL, Injection, ONCE, Ona Roehrs, MD  ?  lidocaine PF 1% (10 mg/mL) injection 2 mL, 2 mL, Injection, ONCE, Perseus Westall, MD    Physical examination:   There were no vitals taken for this visit.       General: The patient is a well-developed, well nourished 75 y.o. male in no acute distress.   HEENT: Head is normocephalic and atraumatic. EOMI bilaterally.   Cardiac: Based on palpation, pulse appears to be regular rate and rhythm.   Pulmonary: The patient has unlabored respirations and bilateral symmetric chest excursion.   Abdomen: Soft, nontender, and nondistended. There is no rebound or guarding.   Extremities: No clubbing, cyanosis, or edema.     Neurologic:   The patient is alert and oriented times 3.   Cranial nerves II through XII are intact without any focal deficits.     Musculoskeletal:   Walks with cane.     L-Spine   There is mild low lumbar paraspinal tenderness. Paraspinal muscle tone is increased.  Facet loading is positive.  ROM with flexion, extension, rotation, and lateral bending is intact.  Strength is equal and adequate bilaterally in the flexors and extensors of the bilateral lower extremities.   SLR is negative bilaterally.         Results for orders placed during the hospital encounter of 08/24/20    MRI L-SPINE WO/W CONTRAST    Addendum 08/25/2020  5:39 PM  Finalized by Ivory Broad, M.D. on 08/24/2020 5:00 PM. Dictated by Delfin Gant, M.D. on 08/24/2020 3:54 PM.Addendum:  Delfin Gant discussed these findings via telephone with Dr. Francee Nodal nurse, Vassie Moment, at 8:34 AM on 08/25/2020.      Approved by Delfin Gant, M.D. on 08/25/2020 8:35 AM    By my electronic signature, I attest that I have personally reviewed the images for this examination and formulated the interpretations and opinions expressed in this report      Finalized by Ivory Broad, M.D. on 08/25/2020 5:36 PM. Dictated by Delfin Gant, M.D. on 08/25/2020 8:33 AM.    Narrative  EXAM: MRI L-SPINE    HISTORY:    , lumbar radiculapathy,    Technique: Multiple sagittal and axial MR sequences were obtained of the lumbar spine with and without MultiHance contrast.    Comparison: CT lumbar spine August 27, 2014    FINDINGS:    There are 5 nonrib-bearing lumbar type vertebral bodies. There is left convexity lumbar spinal curvature. Trace retrolisthesis at L2-L3 and trace anterolisthesis of L5-S1. X-Stop devices are seen at the spinous processes of the lumbar spine. Vertebral body heights are maintained. Multilevel vertebral endplate marrow changes. No suspicious bone marrow replacing lesion. The conus is normal in appearance and position at the T12-L1 level. abnormal enhancing lesion is identified. Bilateral renal atrophy. Small upper pole right renal cyst. There is an additional 1.3 cm upper pole right renal lesion which demonstrates T2 and T1 hypointensity (series 5, image 8).    T12-L1: No significant central spinal or neuroforaminal stenosis.    L1-L2: Marked disc degeneration, mild circumferential disc bulge, and facet hypertrophy. Mild central spinal stenosis. Moderate left and marked right neural foraminal stenosis.    L2-L3: Trace retrolisthesis, moderate disc degeneration, circumferential disc bulge, facet hypertrophy, and mild malignant flavum thickening. Mild central spinal  stenosis. Moderate left and marked right neuroforaminal stenosis.    L3-L4: Moderate disc degeneration, circumferential disc bulge, and facet hypertrophy. Moderate to marked spinal stenosis. Marked bilateral neuroforaminal stenosis.    L4-L5: Mild disc degeneration, circumferential disc bulge with superimposed central disc protrusion, ligamentum flavum thickening, and facet hypertrophy. Moderate to marked central spinal stenosis. Marked bilateral neural foraminal stenosis.    L5-S1: Mild disc degeneration, circumferential disc bulge, and facet hypertrophy. Moderate central spinal stenosis. Marked bilateral neural foraminal stenosis.    Impression  1.  Multilevel degenerative central spinal stenosis, greatest of moderate to marked degree at L3-L4, L4-L5, and L5-S1.  2.  Multilevel degenerative neuroforaminal stenosis, greatest of marked degree on the right at L1-L2 and L2-L3 as well as bilaterally at L3-L4, L4-5, and L5-S1.  3.  Left convexity lumbar spinal curvature.  4.  Trace retrolisthesis of L2-L3 and trace anterolisthesis of L5-S1.  5.  T2 and T1 hypointense peripheral right renal lesion. MRI abdomen (renal mass protocol) is recommended for further evaluation.    By my electronic signature, I attest that I have personally reviewed the images for this examination and formulated the interpretations and opinions expressed in this report          Last Cr and LFT's:  Creatinine   Date Value Ref Range Status   11/05/2020 1.21  Final     AST (SGOT)   Date Value Ref Range Status   11/22/2018 25 7 - 40 U/L Final     ALT (SGPT)   Date Value Ref Range Status   11/22/2018 24 7 - 56 U/L Final     Alk Phosphatase   Date Value Ref Range Status   11/22/2018 51 25 - 110 U/L Final     Total Bilirubin   Date Value Ref Range Status   11/22/2018 0.8 0.3 - 1.2 MG/DL Final          Assessment:    Johnny [name] is a 75 y.o. 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 who presents for evaluation of pain.    The pain complaints are most likely due to:    1. Spondylosis of lumbosacral region without myelopathy or radiculopathy  [place] AMB SPINE INJECT MBB LUMB/SACRAL    [place] AMB SPINE INJECT MBB LUMB/SACRAL    lidocaine PF 1% (10 mg/mL) injection 2 mL    bupivacaine PF (MARCAINE) 0.5 % injection 3 mL   2. DDD (degenerative disc disease), lumbosacral  [place] AMB SPINE INJECT MBB LUMB/SACRAL    [place] AMB SPINE INJECT MBB LUMB/SACRAL    lidocaine PF 1% (10 mg/mL) injection 2 mL    bupivacaine PF (MARCAINE) 0.5 % injection 3 mL   3. Facet arthropathy  [place] AMB SPINE INJECT MBB LUMB/SACRAL    [place] AMB SPINE INJECT MBB LUMB/SACRAL    lidocaine PF 1% (10 mg/mL) injection 2 mL bupivacaine PF (MARCAINE) 0.5 % injection 3 mL       Patient has had an adequate trial of > 3 months of rest,  exercise, multimodal treatment, and the passage of time without improvement of symptoms. The pain has significant impact on the daily quality of life.     Plan:    1. Plan for Bilateral L4-S1 MBB x2 in Procedures at first available appointment.   2. If he fails to improve, could consider diagnostic SIJ injections in the future.   3. We also discussed SCS trial with Nevro/HFX as a possibility.   4. Will trial tizanidine 2-4mg  qhs prn.   5. Follow up as needed.     Risks/benefits of all pharmacologic and interventional treatments discussed and questions answered.     Thank you for this kind referral for consultation. Please feel free to contact me with any questions or concerns.

## 2020-11-17 NOTE — Telephone Encounter
Received notification that  [employer] has not been connected since 10/29/20. Patient was instructed to look at his/her transmitter to make sure that it is plugged into power and send a manual transmission to reconnect the transmitter. If he/she has any questions about how to send a transmission or if the transmitter does not appear to be working properly, they need to contact the device company directly. Patient was provided with that contact number. Requested the patient send [place] a MyChart message or contact our device nurses at [phone] to let us know after they have sent their transmission. Called preferred phone number, Spoke with [place]. He will take a look once he gets home.Patient verbalized understanding.

## 2020-11-17 NOTE — Procedures
Attending Surgeon: Evelina Bucy, MD    Anesthesia: Local    Pre-Procedure Diagnosis:   1. Spondylosis of lumbosacral region without myelopathy or radiculopathy    2. DDD (degenerative disc disease), lumbosacral    3. Facet arthropathy        Post-Procedure Diagnosis:   1. Spondylosis of lumbosacral region without myelopathy or radiculopathy    2. DDD (degenerative disc disease), lumbosacral    3. Facet arthropathy        Medial Branch Block Lumbar/Sacral  Procedure: medial branch block    Laterality: bilateral    Location: - L3, L4 and L5      Consent:   Consent obtained: written  Consent given by: patient  Risks discussed: allergic reaction, bleeding, bruising, infection, nerve damage, no change or worsening in pain and reaction to medication  Alternatives discussed: alternative treatment, delayed treatment, no treatment and referral  Discussed with patient the purpose of the treatment/procedure, other ways of treating my condition, including no treatment/ procedure and the risks and benefits of the alternatives. Patient has decided to proceed with treatment/procedure.        Universal Protocol:  Relevant documents: relevant documents present and verified  Test results: test results available and properly labeled  Imaging studies: imaging studies available  Required items: required blood products, implants, devices, and special equipment available  Site marked: the operative site was marked  Patient identity confirmed: Patient identify confirmed verbally with patient.        Time out: Immediately prior to procedure a time out was called to verify the correct patient, procedure, equipment, support staff and site/side marked as required      Procedures Details:   Indications: pain and diagnostic evaluation   Prep: chlorhexidine  Patient position: prone  Number of Joints: 2  Guidance: fluoroscopy  Needle and Epidural Catheter: quincke  Needle size: 25 G  Patient tolerance: Patient tolerated the procedure well with no immediate complications. Pressure was applied, and hemostasis was accomplished.  Comments:   LUMBAR MEDIAL BRANCH BLOCKS UNDER FLUOROSCOPY    PROCEDURE:  1) bilateral L3-L5 medial branch blocks targeting the L4-5 and L5-S1 joints  2) Fluoroscopic needle guidance    REASON FOR PROCEDURE:     Spondylosis without myelopathy or radiculopathy, DDD (lumbosacral), Facet arthropathy, and axial back pain    PHYSICIAN: Evelina Bucy, MD    MEDICATIONS INJECTED: Bupivacaine 0.5% (0.5 ml each level)    LOCAL ANESTHETIC INJECTED: 0.5 mL of 1% lidocaine per site    SEDATION MEDICATIONS: None    ESTIMATED BLOOD LOSS: None    SPECIMENS REMOVED: None    COMPLICATIONS: None    TECHNIQUE: Time-out was taken to identify the correct patient, procedure and side prior to starting the procedure. Lying in a prone position, the patient was prepped and draped in the usual sterile fashion using DuraPrep and a fenestrated drape. Each site was identified under fluoroscopy. Local anesthetic was given by raising a wheal and going down to the hub of a 27-gauge 1.25-inch needle. The 25-gauge 3.5-inch Quincke needle was advanced to the anatomic location of each medial branch at the junction of the superior articular process and transverse process utilizing intermittent fluoroscopy. Medication was then injected slowly.    The procedure was completed without complications and was tolerated well. The patient was monitored after the procedure. The patient (or responsible party) was given post-procedure and  discharge instructions to follow at home. The patient was discharged in stable condition.     Note:  The patient has been instructed to call to inform us what percentage of pain relief was obtained after the facet nerve blocks from today. A pain diary was provided. The patient was also instructed to do the activities that would normally worsen the pain.             Estimated blood loss: none or minimal  Specimens: none  Patient tolerated the procedure well with no immediate complications. Pressure was applied, and hemostasis was accomplished.

## 2020-11-20 ENCOUNTER — Encounter: Admit: 2020-11-20 | Discharge: 2020-11-20 | Payer: MEDICARE

## 2020-11-20 NOTE — Telephone Encounter
-----   Message from Gordy Savers sent at 11/20/2020  2:28 PM CST -----  Regarding: Patient returned your phone call  [phone]

## 2020-11-20 NOTE — Telephone Encounter
Returned call to pt. Called preferred phone number, Spoke with Johnny [name], He wanted to make sure that he was connected and that we got the remote transmission.   We did get a transmission today. Answer all question no other action is required that this time.       CDJ

## 2020-11-23 ENCOUNTER — Encounter: Admit: 2020-11-23 | Discharge: 2020-11-23 | Payer: MEDICARE

## 2020-11-23 ENCOUNTER — Ambulatory Visit: Admit: 2020-11-23 | Discharge: 2020-11-23 | Payer: MEDICARE

## 2020-11-23 DIAGNOSIS — N2889 Other specified disorders of kidney and ureter: Secondary | ICD-10-CM

## 2020-11-23 DIAGNOSIS — M5412 Radiculopathy, cervical region: Secondary | ICD-10-CM

## 2020-11-23 DIAGNOSIS — Z95810 Presence of automatic (implantable) cardiac defibrillator: Secondary | ICD-10-CM

## 2020-11-23 MED ORDER — SODIUM CHLORIDE 0.9 % IJ SOLN
50 mL | Freq: Once | INTRAVENOUS | 0 refills | Status: CP
Start: 2020-11-23 — End: ?
  Administered 2020-11-23: 18:00:00 50 mL via INTRAVENOUS

## 2020-11-23 MED ORDER — GADOBENATE DIMEGLUMINE 529 MG/ML (0.1MMOL/0.2ML) IV SOLN
20 mL | Freq: Once | INTRAVENOUS | 0 refills | Status: CP
Start: 2020-11-23 — End: ?
  Administered 2020-11-23: 18:00:00 20 mL via INTRAVENOUS

## 2020-11-23 NOTE — Progress Notes
Procedure: MRI C spine and Abdomen with and without contrast    Pacemaker Type: Conditional    Order verified: Yes    Allergies reviewed: Yes    Patient history reviewed: Yes      Conditional Pacemaker: Minimum requirements, vital signs needed pre/post scan (See DocFlowsheet for further).      Pt placed on MRI safe cardiac, BP and O2 monitors.   ?   Pre procedure Vital Signs (See DocFlowsheet for further).    MRI Device Settings placed by EP Device Staff, Estate agent.    Program Mode: ODO    Pacing Mode: Off      Device returned back to prior MRI device settings post-scan by EP Device staff, Estate agent.    Post procedure Vital Signs (See DocFlowsheet for further).

## 2020-11-24 NOTE — Telephone Encounter
Patient called to report pain diary status post Facet Joint Injection MBB #1    Prior to procedure 6/10  Location: low back   Procedure completed approximately 1500 (per patient report)    Pre-procedure pain level returned at 2100    Experienced 80% of relief for 3 hours in low back (location)

## 2020-11-30 ENCOUNTER — Encounter: Admit: 2020-11-30 | Discharge: 2020-11-30 | Payer: MEDICARE

## 2020-11-30 NOTE — Telephone Encounter
Received Tier Exception forms from [facility] for Metoprolol and Rosuvastatin. Verified with patient he wants these submitted. States his copay recently went up 7x.

## 2020-12-01 ENCOUNTER — Encounter: Admit: 2020-12-01 | Discharge: 2020-12-01 | Payer: MEDICARE

## 2020-12-01 ENCOUNTER — Ambulatory Visit: Admit: 2020-12-01 | Discharge: 2020-12-01 | Payer: MEDICARE

## 2020-12-01 DIAGNOSIS — R053 Chronic cough: Secondary | ICD-10-CM

## 2020-12-01 DIAGNOSIS — J449 Chronic obstructive pulmonary disease, unspecified: Secondary | ICD-10-CM

## 2020-12-01 DIAGNOSIS — R7303 Prediabetes: Secondary | ICD-10-CM

## 2020-12-01 DIAGNOSIS — I255 Ischemic cardiomyopathy: Secondary | ICD-10-CM

## 2020-12-01 DIAGNOSIS — J302 Other seasonal allergic rhinitis: Secondary | ICD-10-CM

## 2020-12-01 DIAGNOSIS — I219 Acute myocardial infarction, unspecified: Secondary | ICD-10-CM

## 2020-12-01 DIAGNOSIS — M47819 Spondylosis without myelopathy or radiculopathy, site unspecified: Secondary | ICD-10-CM

## 2020-12-01 DIAGNOSIS — I340 Nonrheumatic mitral (valve) insufficiency: Secondary | ICD-10-CM

## 2020-12-01 DIAGNOSIS — I4891 Unspecified atrial fibrillation: Secondary | ICD-10-CM

## 2020-12-01 DIAGNOSIS — E785 Hyperlipidemia, unspecified: Secondary | ICD-10-CM

## 2020-12-01 DIAGNOSIS — Z95810 Presence of automatic (implantable) cardiac defibrillator: Secondary | ICD-10-CM

## 2020-12-01 DIAGNOSIS — R52 Pain, unspecified: Secondary | ICD-10-CM

## 2020-12-01 DIAGNOSIS — T148XXA Other injury of unspecified body region, initial encounter: Secondary | ICD-10-CM

## 2020-12-01 DIAGNOSIS — I2510 Atherosclerotic heart disease of native coronary artery without angina pectoris: Secondary | ICD-10-CM

## 2020-12-01 DIAGNOSIS — M5137 Other intervertebral disc degeneration, lumbosacral region: Secondary | ICD-10-CM

## 2020-12-01 DIAGNOSIS — G4733 Obstructive sleep apnea (adult) (pediatric): Secondary | ICD-10-CM

## 2020-12-01 DIAGNOSIS — K219 Gastro-esophageal reflux disease without esophagitis: Secondary | ICD-10-CM

## 2020-12-01 DIAGNOSIS — G5600 Carpal tunnel syndrome, unspecified upper limb: Secondary | ICD-10-CM

## 2020-12-01 DIAGNOSIS — N529 Male erectile dysfunction, unspecified: Secondary | ICD-10-CM

## 2020-12-01 DIAGNOSIS — I10 Essential (primary) hypertension: Secondary | ICD-10-CM

## 2020-12-01 DIAGNOSIS — M1990 Unspecified osteoarthritis, unspecified site: Secondary | ICD-10-CM

## 2020-12-01 DIAGNOSIS — K449 Diaphragmatic hernia without obstruction or gangrene: Secondary | ICD-10-CM

## 2020-12-01 DIAGNOSIS — E669 Obesity, unspecified: Secondary | ICD-10-CM

## 2020-12-01 DIAGNOSIS — I509 Heart failure, unspecified: Secondary | ICD-10-CM

## 2020-12-01 DIAGNOSIS — M549 Dorsalgia, unspecified: Secondary | ICD-10-CM

## 2020-12-01 DIAGNOSIS — M47817 Spondylosis without myelopathy or radiculopathy, lumbosacral region: Secondary | ICD-10-CM

## 2020-12-01 MED ORDER — TIZANIDINE 2 MG PO TAB
6 mg | ORAL_TABLET | Freq: Every evening | ORAL | 3 refills | Status: AC | PRN
Start: 2020-12-01 — End: ?

## 2020-12-01 MED ORDER — BUPIVACAINE (PF) 0.5 % (5 MG/ML) IJ SOLN
3 mL | Freq: Once | INTRAMUSCULAR | 0 refills | Status: CP
Start: 2020-12-01 — End: ?

## 2020-12-01 MED ORDER — LIDOCAINE (PF) 10 MG/ML (1 %) IJ SOLN
2 mL | Freq: Once | INTRAMUSCULAR | 0 refills | Status: CP
Start: 2020-12-01 — End: ?

## 2020-12-01 NOTE — Procedures
Attending Surgeon: Evelina Bucy, MD    Anesthesia: Local    Pre-Procedure Diagnosis:   1. Spondylosis of lumbosacral region without myelopathy or radiculopathy    2. DDD (degenerative disc disease), lumbosacral    3. Facet arthropathy        Post-Procedure Diagnosis:   1. Spondylosis of lumbosacral region without myelopathy or radiculopathy    2. DDD (degenerative disc disease), lumbosacral    3. Facet arthropathy        MBB LMBR/SAC Injection  Procedure: medial branch block    Laterality: bilateral    Location: - L3, L4 and L5      Consent:   Consent obtained: written  Consent given by: patient  Risks discussed: allergic reaction, bleeding, bruising, infection, nerve damage, no change or worsening in pain and reaction to medication  Alternatives discussed: alternative treatment, delayed treatment, no treatment and referral  Discussed with patient the purpose of the treatment/procedure, other ways of treating my condition, including no treatment/ procedure and the risks and benefits of the alternatives. Patient has decided to proceed with treatment/procedure.        Universal Protocol:  Relevant documents: relevant documents present and verified  Test results: test results available and properly labeled  Imaging studies: imaging studies available  Required items: required blood products, implants, devices, and special equipment available  Site marked: the operative site was marked  Patient identity confirmed: Patient identify confirmed verbally with patient.        Time out: Immediately prior to procedure a time out was called to verify the correct patient, procedure, equipment, support staff and site/side marked as required      Procedures Details:   Indications: pain and diagnostic evaluation   Prep: chlorhexidine  Patient position: prone  Number of Joints: 2  Guidance: fluoroscopy  Needle and Epidural Catheter: quincke  Needle size: 25 G  Patient tolerance: Patient tolerated the procedure well with no immediate complications. Pressure was applied, and hemostasis was accomplished.  Comments:   LUMBAR MEDIAL BRANCH BLOCKS UNDER FLUOROSCOPY    PROCEDURE:  1) bilateral L3-L5 medial branch blocks targeting the L4-5 and L5-S1 joints  2) Fluoroscopic needle guidance    REASON FOR PROCEDURE:     Spondylosis without myelopathy or radiculopathy, DDD (lumbosacral), Facet arthropathy, and axial back pain    PHYSICIAN: Evelina Bucy, MD    MEDICATIONS INJECTED: Bupivacaine 0.5% (0.5 ml each level)    LOCAL ANESTHETIC INJECTED: 0.5 mL of 1% lidocaine per site    SEDATION MEDICATIONS: None    ESTIMATED BLOOD LOSS: None    SPECIMENS REMOVED: None    COMPLICATIONS: None    TECHNIQUE: Time-out was taken to identify the correct patient, procedure and side prior to starting the procedure. Lying in a prone position, the patient was prepped and draped in the usual sterile fashion using DuraPrep and a fenestrated drape. Each site was identified under fluoroscopy. Local anesthetic was given by raising a wheal and going down to the hub of a 27-gauge 1.25-inch needle. The 25-gauge 3.5-inch Quincke needle was advanced to the anatomic location of each medial branch at the junction of the superior articular process and transverse process utilizing intermittent fluoroscopy. Medication was then injected slowly.    The procedure was completed without complications and was tolerated well. The patient was monitored after the procedure. The patient (or responsible party) was given post-procedure and  discharge instructions to follow at home. The patient was discharged in stable condition.     Note: The  patient has been instructed to call to inform us what percentage of pain relief was obtained after the facet nerve blocks from today. A pain diary was provided. The patient was also instructed to do the activities that would normally worsen the pain.             Estimated blood loss: none or minimal  Specimens: none  Patient tolerated the procedure well with no immediate complications. Pressure was applied, and hemostasis was accomplished.

## 2020-12-01 NOTE — Discharge Instructions - Supplementary Instructions
MEDIAL BRANCH BLOCK   DISCHARGE INSTRUCTIONS    Important information following your procedure today:     This injection is for diagnostic purposes, it is a test block. Only short-term results are expected.  Though the procedure is generally safe, and complications are rare, we do ask that you be aware of any of the following:   Any swelling, persistent redness, new bleeding, or drainage from the site of the injection.  You should not experience a severe headache.  You should not run a fever over 101? F.  New onset of sharp, severe back & or neck pain.  New onset of upper or lower extremity numbness or weakness.  New difficulty controlling bowel or bladder function after the injection.  New shortness of breath.    ** If any of these occur, please call to report this occurrence to a nurse at [phone]. If you are calling after 4:00 p.m. or on weekends or holidays, please call [phone] and ask to have the resident physician on call for the physician paged or go to your local emergency room.  You may experience soreness at the injection site. Ice can be applied at 20-minute intervals. Avoid application of direct heat, hot showers or hot tubs today.  Patients taking a daily blood thinner can resume their regular dose this evening.  It is important that you take all medications ordered by your pain physician. Taking medication as ordered is an important part of your pain care plan. If you cannot continue the medication plan, please notify the physician.   Remain active today. Do the activities that would normally cause you pain.  It is important for you to keep track of the results of this test on paper.  Did you get relief?  Percentage of relief?   How long did it last?    Call and report the results to your physician's nurse at the office number listed below. Use notes that you kept on your pain diary when giving your report. You may have to leave a message and a nurse will contact you to help you determine if you are a candidate for the Radiofrequency Ablation Procedure.    PAIN DIARY    Please report pain on 0-10 scale for each hour listed following discharge.  (0 = No pain; 5 = Moderate pain; 10 = Worst pain of your life)    TIME DAY 1 DAY 2   12AM MIDNIGHT     1AM       2AM       3AM       4AM        5AM       6AM       7AM       8AM       9AM       10AM       11AM       12PM NOON     1PM       2PM       3PM       4PM       5PM       6PM        7PM       8PM       9PM       10PM       11PM         Dr. Meredith Leeds nurse:        [phone]   Dr.  Braun's nurse:   [phone]   Dr. Ralph Dowdy nurse: [phone]   Dr. Mellody Life nurse:       [phone]   Dr. Joan Flores nurse:      [phone]   Dr. Larene Beach nurse:      [phone]   Dr. Arvil Chaco nurse:   [phone]   Dr. Michaelle Copas nurse:     [phone]   Dr. Juel Burrow nurse:  [phone]         Please contact your provider's office to report your maximal percent relief from your test blocks today and the duration of relief.    **If you did not get any relief, please also mention this as well, as this may affect the next steps in your care.       If you are unable to keep your upcoming appointment, please notify the [facility] scheduler at [phone] at least 24 hours in advance. If you have questions for the surgery center, call [facility] at [phone].

## 2020-12-01 NOTE — Progress Notes
I have examined the patient, and there are no significant changes in their condition, from the previous H&P performed on 11/06/20.     Bilateral L4-S1 MBB        [facility]  NEW PATIENT HISTORY AND PHYSICAL  Subjective     Chief Complaint:   No chief complaint on file.      HPI: Johnny [name] is a 75 y.o. 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 who presents for evaluation.    The pain is in the    low back in a band across.   There is pain into the buttocks.     He used to have radicular pain in the LEs, but that has resolved since surgery in 2008.     He saw Dr. Shona Needles who brought up SCS.   Patient wanted to consider RFA, but did not end up proceeding with that there.     His pain is axial.   No radicular LE pain.     Positive shopping cart sign.   No claudicant symptoms.     No pain in the buttocks into the groin.     Pain started:    Decades, worse more recently.     Initial inciting injury or event:    No    Numbness/tingling:    No    The pain averages    7-8/10    The pain is described as    achy    The pain is exacerbated by      walking, bending, lifting    The pain is partially alleviated by      laying down, rest    +muscle stiffness/tightness         PRIOR MEDICATIONS:   Effective  Acetaminophen (little)    Ineffective  Gabapentin    Unable to tolerate  NSAID    Never  Lyrica  Ami/Nortriptyline  Cymbalta  Tizanidine      PRIOR INTERVENTIONS:  L-spine surgery with hardware L2-3 L3-4 and then later L4-5 and L5-1 next (2 surgeries, most recent in 2008)  Effective  Bilateral SIJ (OSH, good benefit)    Ineffective  ESI x3 (OSH)        Johnny [name] denies any recent fevers, chills, infection, antibiotics, bowel or bladder incontinence, saddle anesthesia. +Xarelto for a. fib.      ROS: All 14 systems reviewed and found to be negative except as above and as follows. +fatigue, joint pains.    Past Medical History:  Medical History:   Diagnosis Date   ? Atrial fibrillation (HCC) 02/08/2009   ? Back pain    ? CAD (coronary artery disease) 02/08/2009   ? Carpal tunnel syndrome    ? Chronic cough    ? Congestive heart disease (HCC)    ? COPD (chronic obstructive pulmonary disease) (HCC)    ? Erectile dysfunction, vasculogenic    ? Fracture    ? GERD (gastroesophageal reflux disease)     controlled  with elevated HOB, protonix and pepcid    ? Hiatal hernia    ? Hyperlipemia 02/11/2009   ? Hypertension 02/11/2009   ? 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 Mitral regurgitation    ? Myocardial infarction [facility]) 2006   ? Obesity, Class I, 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)    ? OSA on CPAP 02/11/2009   ? Osteoarthritis    ? Pre-diabetes    ? S/P ICD (internal cardiac defibrillator) procedure 02/11/2009   ? Seasonal allergies        Family History:  Family History   Problem Relation Age of Onset   ? Cancer Father    ? Hypertension Mother        Social History:  Lives in [place] [id number] (1.25 hours away)    Social History     Socioeconomic History   ? Marital status: Married     Spouse name: Not on file   ? Number of children: Not on file   ? Years of education: Not on file   ? Highest education level: Not on file Occupational History   ? Not on file   Tobacco Use   ? Smoking status: Never Smoker   ? Smokeless tobacco: Never Used   Vaping Use   ? Vaping Use: Never used   Substance and Sexual Activity   ? Alcohol use: No     Alcohol/week: 0.0 standard drinks     Comment: very rarely 4 drinks per year   ? Drug use: No   ? Sexual activity: Not on file   Other Topics Concern   ? Not on file   Social History Narrative   ? Not on file       Allergies:  Allergies   Allergen Reactions   ? Clarithromycin RASH     Allergy recorded in SMS: Biaxin~Reactions: RASH~THRUSH   ? Ketoconazole SEE COMMENTS     Had bloodshot eyes after use, and sore.   ? Lisinopril COUGH       Medications:    Current Outpatient Medications:   ?  acetaminophen (TYLENOL) 325 mg tablet, Take 2 Tabs by mouth every 4 hours as needed for Pain., Disp: 300 Tab, Rfl: 1  ?  albuterol (VENTOLIN HFA, PROAIR HFA) 90 mcg/actuation inhaler, Inhale 2 Puffs by mouth four times daily as needed for Wheezing., Disp: , Rfl:   ?  aspirin 81 mg chewable tablet, Take 1 Tab by mouth daily. WAIT to restart until finished taking lovenox injections. (Patient taking differently: Chew 81 mg by mouth at bedtime daily.), Disp: 90 Tab, Rfl: 3  ?  cetirizine (ZYRTEC) 10 mg tablet, Take 10 mg by mouth daily., Disp: , Rfl:   ?  cholecalciferol (Vitamin D3) (VITAMIN D-3) 1,000 units tablet, Take 1,000 Units by mouth twice daily.  , Disp: , Rfl:   ?  dextromethorphan/guaiFENesin (MUCINEX DM) 30/600 mg Tb12, Take 2 tablets by mouth daily as needed., Disp: , Rfl:   ?  docusate (COLACE) 100 mg capsule, Take 200 mg by mouth at bedtime daily. Pt is taking 550 mg daily, Disp: , Rfl:   ?  ezetimibe (ZETIA) 10 mg tablet, Take one tablet by mouth at bedtime daily., Disp: 90 tablet, Rfl: 3  ?  famotidine (PEPCID) 40 mg tablet, Take 40 mg by mouth at bedtime daily., Disp: , Rfl:   ?  fexofenadine(+) (ALLEGRA) 180 mg tablet, Take 180 mg by mouth daily., Disp: ,  Rfl:   ?  finasteride (PROSCAR) 5 mg tablet, Take 5 mg by mouth daily., Disp: , Rfl:   ?  fluticasone-umeclidin-vilanter (TRELEGY ELLIPTA) 100-62.5-25 mcg dsdv, Inhale 1 Dose by mouth into the lungs at bedtime daily., Disp: , Rfl:   ?  furosemide (LASIX) 40 mg tablet, TAKE 2 TABLETS ONCE DAILY, TAKE 3 TABLETS ON Monday/WEDNESDAY/FRIDAY AS DIRECTED BY       CARDIOLOGY, Disp: 270 tablet, Rfl: 3  ?  gabapentin (NEURONTIN) 300 mg capsule, Take 300 mg by mouth every 6 hours., Disp: , Rfl:   ?  glucosamine su 2KCl-chondroit 500-400 mg tab, Take 1 Tab by mouth twice daily., Disp: , Rfl:   ?  ipratropium bromide (ATROVENT) 42 mcg (0.06 %) nasal spray, Apply 2 sprays to each nostril as directed twice daily as needed., Disp: , Rfl:   ?  losartan (COZAAR) 50 mg tablet, TAKE 1 TABLET DAILY AT BEDTIME, Disp: 90 tablet, Rfl: 3  ?  metoprolol XL (TOPROL XL) 100 mg extended release tablet, Take one tablet by mouth daily., Disp: 90 tablet, Rfl: 3  ?  mucus clearing device (AEROBIKA OSCILLATING PEP SYSTM MISC), Use  as directed. Use with salt water and inhale twice daily, Disp: , Rfl:   ?  MYRBETRIQ 50 mg tablet, Take 1 tablet by mouth daily., Disp: , Rfl:   ?  OMEGA-3 FATTY ACIDS-FISH OIL PO, Take 2 capsules by mouth every morning, then 3 capsules every evening, Disp: , Rfl:   ?  other medication, mupirocin ointment and budesonide solution mixed with warm water Use as nasal irrigation twice daily, Disp: , Rfl:   ?  oxybutynin chloride (DITROPAN) 5 mg tablet, Take 5 mg by mouth twice daily., Disp: , Rfl:   ?  pantoprazole DR (PROTONIX) 40 mg tablet, Take 40 mg by mouth twice daily., Disp: , Rfl:   ?  polyethylene glycol 3350 (GLYCOLAX; MIRALAX) 17 gram/dose powder, Take 17 g by mouth daily. (Patient taking differently: Take 17 g by mouth nightly as needed.), Disp: 595 g, Rfl: 3  ?  potassium chloride SR (K-DUR) 20 mEq tablet, Take one tab on Sunday, Tuesday, Thursday, and Saturday.  Take two tabs on Mon, Wed and Friday., Disp: 140 tablet, Rfl: 3  ?  rosuvastatin (CRESTOR) 40 mg tablet, Take one tablet by mouth at bedtime daily., Disp: 90 tablet, Rfl: 3  ?  sildenafil(+) (VIAGRA) 100 mg tablet, Take 1 Tab by mouth as Needed for Erectile dysfunction., Disp: 6 Tab, Rfl: 12  ?  spironolactone (ALDACTONE) 25 mg tablet, Take one-half tablet by mouth daily. Take with food. (Patient taking differently: Take 25 mg by mouth daily. Take with food. Take one tablet daily.), Disp: 45 tablet, Rfl: 3  ?  tamsulosin (FLOMAX) 0.4 mg capsule, Take 0.4 mg by mouth daily. Do not crush, chew or open capsules. Take 30 minutes following the same meal each day., Disp: , Rfl:   ?  testosterone cypionate 200 mg/mL kit, Inject 1 mL into the muscle every 14 days., Disp: , Rfl:   ?  tiZANidine (ZANAFLEX) 2 mg tablet, Take two tablets by mouth at bedtime as needed., Disp: 60 tablet, Rfl: 2  ?  traMADoL (ULTRAM) 50 mg tablet, every 4-6 hours, Disp: , Rfl:   ?  traZODone (DESYREL) 100 mg tablet, bedtime, Disp: , Rfl:   ?  Vacuum Erection Device System kit, Use as directed for sexual activity., Disp: 1 kit, Rfl: 11  ?  XARELTO 20 mg tablet,  TAKE 1 TABLET BY MOUTH DAILY - MUST BE WITH EVENING MEAL, Disp: , Rfl:     Current Facility-Administered Medications:   ?  bupivacaine PF (MARCAINE) 0.5 % injection 3 mL, 3 mL, Injection, ONCE, Darling Cieslewicz, MD  ?  lidocaine PF 1% (10 mg/mL) injection 2 mL, 2 mL, Injection, ONCE, Latesia Norrington, MD    Physical examination:   There were no vitals taken for this visit.       General: The patient is a well-developed, well nourished 75 y.o. male in no acute distress.   HEENT: Head is normocephalic and atraumatic. EOMI bilaterally.   Cardiac: Based on palpation, pulse appears to be regular rate and rhythm.   Pulmonary: The patient has unlabored respirations and bilateral symmetric chest excursion.   Abdomen: Soft, nontender, and nondistended. There is no rebound or guarding.   Extremities: No clubbing, cyanosis, or edema.     Neurologic:   The patient is alert and oriented times 3.   Cranial nerves II through XII are intact without any focal deficits.     Musculoskeletal:   Walks with cane.     L-Spine   There is mild low lumbar paraspinal tenderness. Paraspinal muscle tone is increased.  Facet loading is positive.  ROM with flexion, extension, rotation, and lateral bending is intact.  Strength is equal and adequate bilaterally in the flexors and extensors of the bilateral lower extremities.   SLR is negative bilaterally.         Results for orders placed during the hospital encounter of 08/24/20    MRI L-SPINE WO/W CONTRAST    Addendum 08/25/2020  5:39 PM  Finalized by Ivory Broad, M.D. on 08/24/2020 5:00 PM. Dictated by Delfin Gant, M.D. on 08/24/2020 3:54 PM.Addendum:  Delfin Gant discussed these findings via telephone with Dr. Francee Nodal nurse, Vassie Moment, at 8:34 AM on 08/25/2020.      Approved by Delfin Gant, M.D. on 08/25/2020 8:35 AM    By my electronic signature, I attest that I have personally reviewed the images for this examination and formulated the interpretations and opinions expressed in this report      Finalized by Ivory Broad, M.D. on 08/25/2020 5:36 PM. Dictated by Delfin Gant, M.D. on 08/25/2020 8:33 AM.    Narrative  EXAM: MRI L-SPINE    HISTORY:    , lumbar radiculapathy,    Technique: Multiple sagittal and axial MR sequences were obtained of the lumbar spine with and without MultiHance contrast.    Comparison: CT lumbar spine August 27, 2014    FINDINGS:    There are 5 nonrib-bearing lumbar type vertebral bodies. There is left convexity lumbar spinal curvature. Trace retrolisthesis at L2-L3 and trace anterolisthesis of L5-S1. X-Stop devices are seen at the spinous processes of the lumbar spine. Vertebral body heights are maintained. Multilevel vertebral endplate marrow changes. No suspicious bone marrow replacing lesion. The conus is normal in appearance and position at the T12-L1 level. abnormal enhancing lesion is identified. Bilateral renal atrophy. Small upper pole right renal cyst. There is an additional 1.3 cm upper pole right renal lesion which demonstrates T2 and T1 hypointensity (series 5, image 8).    T12-L1: No significant central spinal or neuroforaminal stenosis.    L1-L2: Marked disc degeneration, mild circumferential disc bulge, and facet hypertrophy. Mild central spinal stenosis. Moderate left and marked right neural foraminal stenosis.    L2-L3: Trace retrolisthesis, moderate disc degeneration, circumferential disc bulge, facet hypertrophy, and mild malignant flavum thickening. Mild central spinal  stenosis. Moderate left and marked right neuroforaminal stenosis.    L3-L4: Moderate disc degeneration, circumferential disc bulge, and facet hypertrophy. Moderate to marked spinal stenosis. Marked bilateral neuroforaminal stenosis.    L4-L5: Mild disc degeneration, circumferential disc bulge with superimposed central disc protrusion, ligamentum flavum thickening, and facet hypertrophy. Moderate to marked central spinal stenosis. Marked bilateral neural foraminal stenosis.    L5-S1: Mild disc degeneration, circumferential disc bulge, and facet hypertrophy. Moderate central spinal stenosis. Marked bilateral neural foraminal stenosis.    Impression  1.  Multilevel degenerative central spinal stenosis, greatest of moderate to marked degree at L3-L4, L4-L5, and L5-S1.  2.  Multilevel degenerative neuroforaminal stenosis, greatest of marked degree on the right at L1-L2 and L2-L3 as well as bilaterally at L3-L4, L4-5, and L5-S1.  3.  Left convexity lumbar spinal curvature.  4.  Trace retrolisthesis of L2-L3 and trace anterolisthesis of L5-S1.  5.  T2 and T1 hypointense peripheral right renal lesion. MRI abdomen (renal mass protocol) is recommended for further evaluation.    By my electronic signature, I attest that I have personally reviewed the images for this examination and formulated the interpretations and opinions expressed in this report          Last Cr and LFT's:  Creatinine   Date Value Ref Range Status   11/05/2020 1.21  Final     AST (SGOT)   Date Value Ref Range Status   11/22/2018 25 7 - 40 U/L Final     ALT (SGPT)   Date Value Ref Range Status   11/22/2018 24 7 - 56 U/L Final     Alk Phosphatase   Date Value Ref Range Status   11/22/2018 51 25 - 110 U/L Final     Total Bilirubin   Date Value Ref Range Status   11/22/2018 0.8 0.3 - 1.2 MG/DL Final          Assessment:    Johnny [name] is a 75 y.o. 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 who presents for evaluation of pain.    The pain complaints are most likely due to:    1. Spondylosis of lumbosacral region without myelopathy or radiculopathy  lidocaine PF 1% (10 mg/mL) injection 2 mL    bupivacaine PF (MARCAINE) 0.5 % injection 3 mL   2. DDD (degenerative disc disease), lumbosacral  lidocaine PF 1% (10 mg/mL) injection 2 mL    bupivacaine PF (MARCAINE) 0.5 % injection 3 mL   3. Facet arthropathy  lidocaine PF 1% (10 mg/mL) injection 2 mL    bupivacaine PF (MARCAINE) 0.5 % injection 3 mL       Patient has had an adequate trial of > 3 months of rest, exercise, multimodal treatment, and the passage of time without improvement of symptoms. The pain has significant impact on the daily quality of life.     Plan:    1. Plan for Bilateral L4-S1 MBB x2 in Procedures at first available appointment.   2. If he fails to  improve, could consider diagnostic SIJ injections in the future.   3. We also discussed SCS trial with Nevro/HFX as a possibility.   4. Will trial tizanidine 2-4mg  qhs prn.   5. Follow up as needed.     Risks/benefits of all pharmacologic and interventional treatments discussed and questions answered.     Thank you for this kind referral for consultation. Please feel free to contact me with any questions or concerns.

## 2020-12-09 ENCOUNTER — Encounter: Admit: 2020-12-09 | Discharge: 2020-12-09 | Payer: MEDICARE

## 2020-12-09 NOTE — Telephone Encounter
Spoke with patient and he stated the Tizanidine was sent to the [facility] pharmacy and then cancelled. He was able to get a 20 day supply from them due to a refill that was already there. I informed the patient that per the chart it looked like the refill was sent to [facility]. I let him know that if he does not receive it from them by next week to give [place] a call back and we would resubmit it to the correct pharmacy. Patient was in agreement with the plan.

## 2020-12-14 ENCOUNTER — Encounter: Admit: 2020-12-14 | Discharge: 2020-12-14 | Payer: MEDICARE

## 2020-12-14 MED ORDER — SPIRONOLACTONE 25 MG PO TAB
ORAL_TABLET | Freq: Every day | ORAL | 3 refills | 46.00000 days | Status: AC
Start: 2020-12-14 — End: ?

## 2020-12-22 ENCOUNTER — Encounter: Admit: 2020-12-22 | Discharge: 2020-12-22 | Payer: MEDICARE

## 2020-12-22 ENCOUNTER — Ambulatory Visit: Admit: 2020-12-22 | Discharge: 2020-12-22 | Payer: MEDICARE

## 2020-12-22 DIAGNOSIS — E669 Obesity, unspecified: Secondary | ICD-10-CM

## 2020-12-22 DIAGNOSIS — M1990 Unspecified osteoarthritis, unspecified site: Secondary | ICD-10-CM

## 2020-12-22 DIAGNOSIS — R7303 Prediabetes: Secondary | ICD-10-CM

## 2020-12-22 DIAGNOSIS — I255 Ischemic cardiomyopathy: Secondary | ICD-10-CM

## 2020-12-22 DIAGNOSIS — Z95810 Presence of automatic (implantable) cardiac defibrillator: Secondary | ICD-10-CM

## 2020-12-22 DIAGNOSIS — R053 Chronic cough: Secondary | ICD-10-CM

## 2020-12-22 DIAGNOSIS — G5600 Carpal tunnel syndrome, unspecified upper limb: Secondary | ICD-10-CM

## 2020-12-22 DIAGNOSIS — N529 Male erectile dysfunction, unspecified: Secondary | ICD-10-CM

## 2020-12-22 DIAGNOSIS — K219 Gastro-esophageal reflux disease without esophagitis: Secondary | ICD-10-CM

## 2020-12-22 DIAGNOSIS — I10 Essential (primary) hypertension: Secondary | ICD-10-CM

## 2020-12-22 DIAGNOSIS — M549 Dorsalgia, unspecified: Secondary | ICD-10-CM

## 2020-12-22 DIAGNOSIS — K449 Diaphragmatic hernia without obstruction or gangrene: Secondary | ICD-10-CM

## 2020-12-22 DIAGNOSIS — J449 Chronic obstructive pulmonary disease, unspecified: Secondary | ICD-10-CM

## 2020-12-22 DIAGNOSIS — G4733 Obstructive sleep apnea (adult) (pediatric): Secondary | ICD-10-CM

## 2020-12-22 DIAGNOSIS — J302 Other seasonal allergic rhinitis: Secondary | ICD-10-CM

## 2020-12-22 DIAGNOSIS — I480 Paroxysmal atrial fibrillation: Secondary | ICD-10-CM

## 2020-12-22 DIAGNOSIS — I509 Heart failure, unspecified: Secondary | ICD-10-CM

## 2020-12-22 DIAGNOSIS — T148XXA Other injury of unspecified body region, initial encounter: Secondary | ICD-10-CM

## 2020-12-22 DIAGNOSIS — I340 Nonrheumatic mitral (valve) insufficiency: Secondary | ICD-10-CM

## 2020-12-22 DIAGNOSIS — I4891 Unspecified atrial fibrillation: Secondary | ICD-10-CM

## 2020-12-22 DIAGNOSIS — E785 Hyperlipidemia, unspecified: Secondary | ICD-10-CM

## 2020-12-22 DIAGNOSIS — I2510 Atherosclerotic heart disease of native coronary artery without angina pectoris: Secondary | ICD-10-CM

## 2020-12-22 DIAGNOSIS — I219 Acute myocardial infarction, unspecified: Secondary | ICD-10-CM

## 2020-12-22 LAB — BASIC METABOLIC PANEL
Lab: 1.2 mg/dL — ABNORMAL HIGH (ref 0.4–1.24)
Lab: 10 (ref 3–12)
Lab: 101 MMOL/L (ref 98–110)
Lab: 102 mg/dL — ABNORMAL HIGH (ref 70–100)
Lab: 140 MMOL/L (ref 137–147)
Lab: 29 MMOL/L (ref 21–30)
Lab: 31 mg/dL — ABNORMAL HIGH (ref 7–25)
Lab: 4.3 MMOL/L (ref 3.5–5.1)
Lab: 60 mL/min — ABNORMAL LOW (ref >60)
Lab: 9 mg/dL (ref 8.5–10.6)

## 2020-12-22 MED ORDER — ENTRESTO 49-51 MG PO TAB
1 | ORAL_TABLET | Freq: Two times a day (BID) | ORAL | 11 refills | Status: AC
Start: 2020-12-22 — End: ?

## 2020-12-22 MED ORDER — ACETAMINOPHEN 325 MG PO TAB
650 mg | ORAL_TABLET | ORAL | 1 refills | Status: AC | PRN
Start: 2020-12-22 — End: ?

## 2020-12-22 NOTE — Progress Notes
Date of Service: 12/22/2020    Johnny [name] is a 75 y.o. male.       HPI       Johnny [name] returns today for follow-up known systolic heart failure with prior CAD, CABG, Afilb and ICD. Marland Kitchen He has been followed at [facility] for  nearly 3 years or he was put on Trelegy with improvement in his respiratory status.  He continues on oral anticoagulation with Xarelto because of his Emert atrial fibrillation and on statin and Zetia for event reduction with his known coronary disease.  He is not having problems with bleeding while on aspirin for the coronary disease in the anticoagulation due to the atrial fib.    He had a nuclear scan in 2019 at [facility] that showed EF 56% with normal perfusion.  A more recent MPI  scan in February 19, 2020 at [facility] showed EF 46% with basal and mid inferior lateral defect suggesting injury and a reversible defect in the basal anterolateral wall suggesting ischemia with normal wall motion.  Inferior wall defect had been seen in 2015 the limited area in the basal anterolateral wall was new.  The ejection fraction on a prior scan from 2015 was essentially unchanged 48%.  The nuclear scan we did in 2021 was done in association with Dr. Paulette Blanch evaluation of VT.  In the absence of angina we did not think these limited defects could be considered triggers for ventricular arrhythmias and that we did not pursue coronary angiography because of the lack of likelihood that there would be any actionable coronary disease identified based on these small defects.         Vitals:    12/22/20 1526   BP: 110/66   BP Source: Arm, Right Upper   Patient Position: Sitting   Pulse: 82   SpO2: 97%   Weight: 111.9 kg (246 lb 9.6 oz)   Height: 182.9 cm (6')   PainSc: Zero     Body mass index is 33.44 kg/m?Marland Kitchen     Past Medical History  Patient Active Problem List    Diagnosis Date Noted   ? Erectile dysfunction, vasculogenic    ? Beta blocker prescribed for left ventricular systolic dysfunction 07/14/2020 Patient on beta-blocker ARB and Aldo blocker because of EF down to 35% with referral for ICD.  EF has improved but patient needs to continue HFrEF oriented medical therapy     ? Visit for monitoring Tikosyn therapy 11/22/2018   ? Atrial fibrillation (HCC) 11/22/2018   ? VT (ventricular tachycardia) (HCC) 02/28/2018     02/19/2020 - Regadenoson MPI:  Small basal to mid inferolateral wall  fixed perfusion defec with corresponding hypokinesis on gated images suggestive of myocardial injury & small size, mild basal anterolateral wall  predominantly reversible perfusion defect  Corresponding regional wall motion is normal.  All myocardial segments appear viable.EF 46%.  High risk scintigraphic features are absent.      ? 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 Encounter for anticoagulation discussion and counseling 11/28/2014   ? Constipation due to opioid therapy 09/05/2014   ? Closed fracture of multiple ribs of left side 08/29/2014   ? 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 Left arm swelling 04/01/2014   ? Hyperlipemia 02/11/2009     HIghest total cholesterol he recalls pre treatment: 270  a. 7/06 Rx Vytorin 10/40, then to 10/80 approx 04/28/05. Recalls knee pain w/ lovastatin  b. 08/03/05 total 156 trig 118  HDL 39 LDL 95  C. 02/09/09 total 148 trig 114  HDL 43  LDL 81 Vytorin 10/80 Fishoil  >>Starts Lipitor 80 + Zetia March 2011     ? Sleep apnea 02/11/2009     Sleep apnea      a. 2/07 Using CPAP, good tolerance and benefit after Sleep study approx 2003 Dr. Andreas Newport     ? Hypertension 02/11/2009     Hypertension        a. ACE cough on lisinopril, olmesartan 20 well tolerated      ? Cerebrovascular disease 02/11/2009     Cerebrovascular disease       a. 7/06 Carotid US mod disease < 50% sten     ? 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  04/14/05 #28 [place] ring, Resect P2 Seg Post leaflet,ring.as above w/ CABG-[place]     ? 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 04/14/05: CABG x 5 LIMA-LAD, L Rad-Dx, sSVG-RCA-RCA, sSVG-OM & MV Plasty for MR  with initial trigger for evaluation being murmur and dyspnea  b. 12/13/05 EF 35% by aden thall, Mixed defect in Cx distribution. Referred for ICD   c. Rash w/ Cleda Daub, switch to Inspra  d. 6/07 Removal of all 8 sternal wires and parasternal lipoma d/t persisting pain  E. Jan, 2011 Regaden Simonne Maffucci. Limited inferior Defect, unchanged from 2007. EF 48%  F. 10/08/13: Reg/Thall Stress: EF 48% No ischemia or change from prior studies.    January 15, 2018  Lexiscan sestamibi study;  sinus rhythm, no arrhythmias or EKG changes    EF was 57% with normal wall motion and normal perfusion. [employer]      ? Atrial Fib Paroxysmal  (HCC) 02/08/2009     a. 7/06 Early post CAB Afib, Brief amio pre dismissal. Post DC AF still rapid, admit [place]  b. 05/23/05 TEE: No LA clot, EF 25% Cvert to SR on Ticosyn 500 BID, dec'd to 250 BID w/             QTC@ , QTc to 470. Home 05/26/05  c. 11/29/05 Ticosyn DC'd, Dr. Hale Bogus p recurrent afib on ticosyn  D. 12/14 NSR on clinic exam, MAC OV (CBP)  E. 05/16/16 ICD interrogation: 6.2% atrial fibrillation burden, on warfarin   F 11/22/2018   Paroxysmal atrial fibrillation RF ablation  Dr Bradly Bienenstock         ? 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 Gretchen Short  12/17/13 Generator at [facility],  Fidelis RV-ICD lead extraction w/ laser assistance,Implant Medtronic Evera XT ICD and new RV lead. RA lead left i tact. DFTs done. Dr. Naoma Diener           Review of Systems   Constitutional: Negative.   HENT: Negative.    Eyes: Negative.    Cardiovascular: Negative.    Respiratory: Positive for shortness of breath.    Endocrine: Negative.    Hematologic/Lymphatic: Negative.    Skin: Negative.    Musculoskeletal: Positive for back pain.   Gastrointestinal: Negative.    Genitourinary: Negative.    Neurological: Negative.    Psychiatric/Behavioral: Negative. Allergic/Immunologic: Negative.        Physical Exam  Appearance:Overweight, but appears otherwise healthy, no distress   Skin: Not pale or icteric, skin warm and dry Eyes: Pupils equal and round Thyroid: not enlarged   Carotids: Normal upstrokes, no bruits Neck Veins: CVP <  8, No V wave or HJR  Chest/thorax: Breathing comfortably. Lungs clear to percussion and auscultation. No rales, rhonchi or wheezing   Cardiac: Rhythm regular. S1 and S2 normal with fourth heart sound, no rub or third sound. Gr i/vi apical systolic murmur   Abdomen: soft, non-tender, no masses. Normal bowel sounds. Liver not enlarged. No abdominal bruit, aorta not palpable   Pulses: Femorals: Normal pulses, no bruits Pedals 1+PT pulses, 1-2 mm bilateral ankle   edema.   Leg/ankle edema:minimal biilateral lower leg Neuro/Motor: Normal strength & gross neurologic function all extremities, normal speech & hearing   Neuro/Cognition: Good insight, clear historian, no depression    Cardiovascular Studies  EKG shows sinus rhythm rate 82 incomplete right bundle branch block QRS duration 112.        Problems Addressed Today  No diagnosis found.    Assessment and Plan     I do not think his heart failure is optimized.  I recommended that he convert from losartan to [facility] starting with half tablet of the 49/51 dosing strength and then advance to a full tab in a few days if he feels that his pressure is tolerating it.  He is on moderate dose losartan 50 so I think he can start with a equivalent of the lowest dose of Entresto for a few days and then advance to moderate dose 49/51 twice daily.  He lives in [place] and came all the way down to see me in [place] today.    I think he could see one of our clinicians in [place] were no longer cover a clinic about a month to make a determination about advancing to 97/103 Entresto.  Increasingly I think this is a state-of-the-art in heart failure meds.  I have had multiple patients who seem cyst satisfactorily managed on mid dose ARB is her ACE inhibitor's who find themselves with improved exercise capacity on Entresto.  Hopefully Johnny [name] will be 1 of those.  I think is renal function and potassium will be suitable for moving forward with the Entresto.     .30 minutes were expended as the total time for this encounter.  The time was spent reviewing records,  interviewing patient, doing exam, developing diagnosis, creating treatment plan written in patient oriented  terminology  for the AVS, explaining it to the patient and entering further information in the EMR.      NB: The free text in this document was generated through Dragon(TM) software with editing and proofreading  done by the author of this document Dr. Mable Paris MD, [facility] principally at the point of care. Some errors may persist.  If there are questions about content in this document please contact Dr. Hale Bogus.    The written information I provided Johnny [name] at the conclusion of today's encounter is as  follows:    Patient Instructions   Overall I think you are a good candidate to try Entresto to replace losartan.  Cost of the Sherryll Burger will be higher.  You may need to do what she can to get the cost to reduce before you fill the prescription.  When and if you get the Entresto take losartan 1 day and start the Entresto 24 hours later.  Generally I want you to start these new drugs with the bedtime dose and then the next day take the morning dose and the bedtime dose.  For a few days take half a pill twice daily and then now when she used to  it and feel comfortable that is not causing her lightheadedness advance to a full pill twice daily.  If you feel better on the half pill just stay on that for a month before you decide to advance it.  It will be a magically beneficial drug but over time you may feel better.  I would like you to see a nurse in [place] or one of the doctors after a month to see about advancing the Entresto to the higher dose which is 97/103 with this is a higher dose tablet.  Really not much reason to come all the way down here to see me to make this dose adjustment.  Would be good for you to see one of the doctors up there so you know someone in who comes to [facility] in cardiology.  Feliz Beam love is a good young cardiologist who I think you like.     I think you will find more trouble with paying for the Jardiance then the [facility] because it newer and does not have a lot of the acceptance of the health plans is built in 2 Entresto by now if you do not have any luck getting tolerable dose at a tolerable price of Entresto we can check into Jardiance but I think Jardiance will be same or more.    I do not have any samples of anything.    Check your chemistry in [place] after about 2 weeks on the Entresto to make sure all the numbers look good.  This is a BMP would be checking    So the plan is to get started on the [facility] see one of the doctors or nurses in [place] probably Dr. About a month after you get started on it to see how things are going.  See me in a couple of months after that back down here.  If something changes let me know before then    Likely to get another BMP today across the street so I know exactly where you are on your chemistry.  Your potassium is been going up and down the last several BMPs would like to know where you are starting today.    I think it would be good for you to return for a recheck in three months.     I can see you sooner if needed.   Use MyChart to message me or if necessary call in if you have problems or questions  Marissa Nestle, MD                Current Medications (including today's revisions)  ? acetaminophen (TYLENOL) 325 mg tablet Take 2 Tabs by mouth every 4 hours as needed for Pain.   ? albuterol (VENTOLIN HFA, PROAIR HFA) 90 mcg/actuation inhaler Inhale 2 Puffs by mouth four times daily as needed for Wheezing.   ? aspirin 81 mg chewable tablet Take 1 Tab by mouth daily. WAIT to restart until finished taking lovenox injections. (Patient taking differently: Chew 81 mg by mouth at bedtime daily.)   ? cetirizine (ZYRTEC) 10 mg tablet Take 10 mg by mouth daily.   ? cholecalciferol (Vitamin D3) (VITAMIN D-3) 1,000 units tablet Take 1,000 Units by mouth twice daily.     ? dextromethorphan/guaiFENesin (MUCINEX DM) 30/600 mg Tb12 Take 2 tablets by mouth daily as needed.   ? docusate (COLACE) 100 mg capsule Take 200 mg by mouth at bedtime daily. Pt is taking 550 mg daily   ? ezetimibe (ZETIA) 10 mg tablet Take one  tablet by mouth at bedtime daily.   ? famotidine (PEPCID) 40 mg tablet Take 40 mg by mouth at bedtime daily.   ? fexofenadine(+) (ALLEGRA) 180 mg tablet Take 180 mg by mouth daily.   ? fluticasone-umeclidin-vilanter (TRELEGY ELLIPTA) 100-62.5-25 mcg dsdv Inhale 1 Dose by mouth into the lungs at bedtime daily.   ? furosemide (LASIX) 40 mg tablet TAKE 2 TABLETS ONCE DAILY, TAKE 3 TABLETS ON Monday/WEDNESDAY/FRIDAY AS DIRECTED BY       CARDIOLOGY   ? gabapentin (NEURONTIN) 300 mg capsule Take 300 mg by mouth every 6 hours.   ? glucosamine su 2KCl-chondroit 500-400 mg tab Take 1 Tab by mouth twice daily.   ? ipratropium bromide (ATROVENT) 42 mcg (0.06 %) nasal spray Apply 2 sprays to each nostril as directed twice daily as needed.   ? losartan (COZAAR) 50 mg tablet TAKE 1 TABLET DAILY AT BEDTIME   ? metoprolol XL (TOPROL XL) 100 mg extended release tablet Take one tablet by mouth daily.   ? mucus clearing device (AEROBIKA OSCILLATING PEP SYSTM MISC) Use  as directed. Use with salt water and inhale twice daily   ? MYRBETRIQ 50 mg tablet Take 1 tablet by mouth daily. Does not take on days he takes Ditropan   ? OMEGA-3 FATTY ACIDS-FISH OIL PO Take 1 capsule by mouth twice daily.   ? other medication mupirocin ointment and budesonide solution mixed with warm water  Use as nasal irrigation twice daily   ? oxybutynin chloride (DITROPAN) 5 mg tablet Take 5 mg by mouth twice daily. Does not take on days he takes Myrbetriq   ? pantoprazole DR (PROTONIX) 40 mg tablet Take 40 mg by mouth twice daily.   ? polyethylene glycol 3350 (GLYCOLAX; MIRALAX) 17 gram/dose powder Take 17 g by mouth daily. (Patient taking differently: Take 17 g by mouth nightly as needed.)   ? potassium chloride SR (K-DUR) 20 mEq tablet Take one tab on Sunday, Tuesday, Thursday, and Saturday.  Take two tabs on Mon, Wed and Friday.   ? rosuvastatin (CRESTOR) 40 mg tablet Take one tablet by mouth at bedtime daily.   ? sildenafil(+) (VIAGRA) 100 mg tablet Take 1 Tab by mouth as Needed for Erectile dysfunction.   ? spironolactone (ALDACTONE) 25 mg tablet TAKE 1 TABLET DAILY WITH FOOD   ? tamsulosin (FLOMAX) 0.4 mg capsule Take 0.4 mg by mouth daily. Do not crush, chew or open capsules. Take 30 minutes following the same meal each day.   ? testosterone cypionate 200 mg/mL kit Inject 1 mL into the muscle every 14 days.   ? tiZANidine (ZANAFLEX) 2 mg tablet Take three tablets by mouth at bedtime as needed. 1 to 3 tabs qhs prn   ? traMADoL (ULTRAM) 50 mg tablet every 4-6 hours   ? traZODone (DESYREL) 100 mg tablet bedtime   ? Vacuum Erection Device System kit Use as directed for sexual activity.   ? XARELTO 20 mg tablet TAKE 1 TABLET BY MOUTH DAILY - MUST BE WITH EVENING MEAL

## 2020-12-24 ENCOUNTER — Encounter: Admit: 2020-12-24 | Discharge: 2020-12-24 | Payer: MEDICARE

## 2020-12-24 ENCOUNTER — Ambulatory Visit: Admit: 2020-12-24 | Discharge: 2020-12-24 | Payer: MEDICARE

## 2020-12-24 DIAGNOSIS — Z95810 Presence of automatic (implantable) cardiac defibrillator: Secondary | ICD-10-CM

## 2020-12-24 DIAGNOSIS — R053 Chronic cough: Secondary | ICD-10-CM

## 2020-12-24 DIAGNOSIS — E785 Hyperlipidemia, unspecified: Secondary | ICD-10-CM

## 2020-12-24 DIAGNOSIS — M1990 Unspecified osteoarthritis, unspecified site: Secondary | ICD-10-CM

## 2020-12-24 DIAGNOSIS — M549 Dorsalgia, unspecified: Secondary | ICD-10-CM

## 2020-12-24 DIAGNOSIS — I340 Nonrheumatic mitral (valve) insufficiency: Secondary | ICD-10-CM

## 2020-12-24 DIAGNOSIS — E669 Obesity, unspecified: Secondary | ICD-10-CM

## 2020-12-24 DIAGNOSIS — J302 Other seasonal allergic rhinitis: Secondary | ICD-10-CM

## 2020-12-24 DIAGNOSIS — M47817 Spondylosis without myelopathy or radiculopathy, lumbosacral region: Secondary | ICD-10-CM

## 2020-12-24 DIAGNOSIS — I2510 Atherosclerotic heart disease of native coronary artery without angina pectoris: Secondary | ICD-10-CM

## 2020-12-24 DIAGNOSIS — I219 Acute myocardial infarction, unspecified: Secondary | ICD-10-CM

## 2020-12-24 DIAGNOSIS — K219 Gastro-esophageal reflux disease without esophagitis: Secondary | ICD-10-CM

## 2020-12-24 DIAGNOSIS — R7303 Prediabetes: Secondary | ICD-10-CM

## 2020-12-24 DIAGNOSIS — N529 Male erectile dysfunction, unspecified: Secondary | ICD-10-CM

## 2020-12-24 DIAGNOSIS — T148XXA Other injury of unspecified body region, initial encounter: Secondary | ICD-10-CM

## 2020-12-24 DIAGNOSIS — I4891 Unspecified atrial fibrillation: Secondary | ICD-10-CM

## 2020-12-24 DIAGNOSIS — I255 Ischemic cardiomyopathy: Secondary | ICD-10-CM

## 2020-12-24 DIAGNOSIS — K449 Diaphragmatic hernia without obstruction or gangrene: Secondary | ICD-10-CM

## 2020-12-24 DIAGNOSIS — G5600 Carpal tunnel syndrome, unspecified upper limb: Secondary | ICD-10-CM

## 2020-12-24 DIAGNOSIS — I509 Heart failure, unspecified: Secondary | ICD-10-CM

## 2020-12-24 DIAGNOSIS — J449 Chronic obstructive pulmonary disease, unspecified: Secondary | ICD-10-CM

## 2020-12-24 DIAGNOSIS — G4733 Obstructive sleep apnea (adult) (pediatric): Secondary | ICD-10-CM

## 2020-12-24 DIAGNOSIS — I10 Essential (primary) hypertension: Secondary | ICD-10-CM

## 2020-12-24 MED ORDER — LIDOCAINE (PF) 10 MG/ML (1 %) IJ SOLN
.2 mL | INTRAMUSCULAR | 0 refills | Status: AC | PRN
Start: 2020-12-24 — End: ?

## 2020-12-24 MED ORDER — LIDOCAINE (PF) 20 MG/ML (2 %) IJ SOLN
5 mL | Freq: Once | INTRAMUSCULAR | 0 refills | Status: CP
Start: 2020-12-24 — End: ?

## 2020-12-24 MED ORDER — DEXAMETHASONE SODIUM PHOS (PF) 10 MG/ML IJ EPIDURAL SOLN
10 mg | Freq: Once | EPIDURAL | 0 refills | Status: CP
Start: 2020-12-24 — End: ?

## 2020-12-24 MED ORDER — FENTANYL CITRATE (PF) 50 MCG/ML IJ SOLN
50-100 ug | INTRAVENOUS | 0 refills | Status: AC | PRN
Start: 2020-12-24 — End: ?

## 2020-12-24 MED ORDER — LIDOCAINE (PF) 10 MG/ML (1 %) IJ SOLN
2 mL | Freq: Once | INTRAMUSCULAR | 0 refills | Status: CP
Start: 2020-12-24 — End: ?

## 2020-12-24 MED ORDER — MIDAZOLAM 1 MG/ML IJ SOLN
1-2 mg | INTRAVENOUS | 0 refills | Status: AC | PRN
Start: 2020-12-24 — End: ?

## 2020-12-24 NOTE — Progress Notes
SPINE CENTER  INTERVENTIONAL PAIN PROCEDURE HISTORY AND PHYSICAL    Chief Complaint: Pain    HISTORY OF PRESENT ILLNESS:  LBP, axial. Bilateral L4-S1 MBB x2 with at least 80% relief.     Medical History:   Diagnosis Date   ? Atrial fibrillation (HCC) 02/08/2009   ? Back pain    ? CAD (coronary artery disease) 02/08/2009   ? Carpal tunnel syndrome    ? Chronic cough    ? Congestive heart disease (HCC)    ? COPD (chronic obstructive pulmonary disease) (HCC)    ? Erectile dysfunction, vasculogenic    ? Fracture    ? GERD (gastroesophageal reflux disease)     controlled with elevated HOB, protonix and pepcid    ? Hiatal hernia    ? Hyperlipemia 02/11/2009   ? Hypertension 02/11/2009   ? 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 Mitral regurgitation    ? Myocardial infarction [facility]) 2006   ? Obesity, Class I, 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)    ? OSA on CPAP 02/11/2009   ? Osteoarthritis    ? Pre-diabetes    ? S/P ICD (internal cardiac defibrillator) procedure 02/11/2009   ? Seasonal allergies        Surgical History:   Procedure Laterality Date   ? HUMERUS FRACTURE SURGERY Left 2004   ? MITRAL VALVULOPLASTY  2006    same time as bypass   ? CORONARY ARTERY BYPASS GRAFT  04/14/05    CABGx5 LIMA-LAD, L Rad-Dx, sSVG-RCA-RCA, sSVG-OM & MV Plasty for MR:   ? RHYTHM DEVICE PLACEMENT  2007    defibrillator   ? FOOT SURGERY Bilateral 2012    Tarsal tunnel release w/ bunionectomy and hammer toe    ? EAR SURGERY Bilateral 2012    Ear Tubes   ? SINUS SURGERY  10/2011   ? CARDIOVASCULAR STRESS TEST  09/2013   ? KNEE REPLACEMENT Right 05/2014   ? 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    Performed by Simonne Maffucci, MD at [facility] OR   ? TRANSVENOUS REMOVAL IMPLANTABLE DEFIBRILLATOR RIGHT VENTRICULAR LEAD Left 03/13/2018    Performed by Kathreen Cornfield, MD at [facility] CVOR   ? INSERTION RIGHT VENTRICULAR LEAD Left 03/13/2018    Performed by Kathreen Cornfield, MD at [facility] CVOR   ? FLUOROSCOPY - CARDIAC  03/13/2018    Performed by Kathreen Cornfield, MD at [facility] CVOR   ? INSERTION/ REPLACEMENT TEMPORARY PACEMAKER LEAD/ CATHETER  03/13/2018    Performed by Kathreen Cornfield, MD at [facility] CVOR   ? REMOVAL AND REPLACEMENT IMPLANTABLE DEFIBRILLATOR GENERATOR - DUAL LEAD SYSTEM  03/13/2018    Performed by Kathreen Cornfield, MD at [facility] CVOR   ? CARDIOTHORACIC SURGERY STANDBY N/A 03/13/2018    Performed by Cath, Physician at [facility] CVOR   ? INTRACARDIAC CATHETER ABLATION WITH COMPREHENSIVE ELECTROPHYSIOLOGIC EVALUATION - ATRIAL FIBRILLATION, RADIOFREQUENCY N/A 11/22/2018    Performed by Kathreen Cornfield, MD at [facility] EP LAB   ? INTRACARDIAC ELECTROPHYSIOLOGIC 3-DIMENSIONAL MAPPING N/A 11/22/2018    Performed by Kathreen Cornfield, MD at [facility] EP LAB   ? INTRACARDIAC ECHOCARDIOGRAPHY N/A 11/22/2018    Performed by Kathreen Cornfield, MD at [facility] EP LAB   ? POSSIBLE INTRAVENOUS DRUG INFUSION FOR STIMULATION AND PACING N/A 11/22/2018    Performed by Kathreen Cornfield, MD at [facility] EP LAB   ? POSSIBLE INSERTION ESOPHAGEAL DEVIATOR N/A  11/22/2018    Performed by Kathreen Cornfield, MD at [facility] EP LAB   ? POSSIBLE EXTERNAL CARDIOVERSION N/A 11/22/2018    Performed by Kathreen Cornfield, MD at [facility] EP LAB   ? INTRACARDIAC CATHETER ABLATION WITH COMPREHENSIVE ELECTROPHYSIOLOGIC EVALUATION - ATYPICAL FLUTTER Right 11/22/2018    Performed by Kathreen Cornfield, MD at [facility] EP LAB   ? TRANSESOPHAGEAL ECHOCARDIOGRAM DURING INTERVENTION N/A 11/22/2018    Performed by Cath, Physician at [facility] EP LAB   ? CARDIAC DEFIBRILLATOR PLACEMENT  11/2013, 02-2018    S/P Fidelis ICD Lead Extraction and New Lead Implantation   ? HX BACK SURGERY  2013, 2006    Lumbart Laminectomy and Fusion   ? HX CARPAL TUNNEL RELEASE Right 1990s   ? HX HEART CATHETERIZATION         family history includes Cancer in his father; Hypertension in his mother.    Social History     Socioeconomic History   ? Marital status: Married     Spouse name: Not on file   ? Number of children: Not on file   ? Years of education: Not on file   ? Highest education level: Not on file   Occupational History   ? Not on file   Tobacco Use   ? Smoking status: Never Smoker   ? Smokeless tobacco: Never Used   Vaping Use   ? Vaping Use: Never used   Substance and Sexual Activity   ? Alcohol use: No     Alcohol/week: 0.0 standard drinks     Comment: very rarely 4 drinks per year   ? Drug use: No   ? Sexual activity: Not on file   Other Topics Concern   ? Not on file   Social History Narrative   ? Not on file       Allergies   Allergen Reactions   ? Clarithromycin RASH     Allergy recorded in SMS: Biaxin~Reactions: RASH~THRUSH   ? Ketoconazole SEE COMMENTS     Had bloodshot eyes after use, and sore.   ? Lisinopril COUGH       Vitals:    12/24/20 1520   BP: 107/73   BP Source: Arm, Left Upper   Pulse: 81   Temp: 36.6 ?C (97.9 ?F)   SpO2: 96%   Weight: 109.6 kg (241 lb 11.2 oz)   Height: 180.3 cm (5' 11)          REVIEW OF SYSTEMS: 10 point ROS obtained and negative      PHYSICAL EXAM:    General: Alert, cooperative, no acute distress.  HEENT: Normocephalic, atraumatic.  Neck: Supple.  Lungs: Unlabored respirations, bilateral and equal chest excursion.  Heart: Regular rate.  Skin: Warm and dry to touch.  Abdomen: Nondistended.  MSK: No deformity.  Neurological: Alert and oriented x3.           IMPRESSION:    1. Spondylosis of lumbosacral region without myelopathy or radiculopathy         PLAN: Other Bilateral L4-S1 RFA

## 2020-12-24 NOTE — Discharge Instructions - Supplementary Instructions
RADIOFREQUENCY ABLATION   DISCHARGE INSTRUCTIONS    Go directly home and rest. DO NOT drive today.  If you have a dressing or bandage on, you may remove it in 12 hours. You may shower tomorrow.   Apply ice to the procedure site at 20-minute intervals frequently for the next 24 hours.  If you had an IV for your procedure and a lump or redness occurs at the site, you may apply a warm, moist compress for 10minutes four times daily for 2-3 days.  If you had sedation for your procedure:  The anesthetic may make you drowsy and slow to react for up to 12 hours.  For the next 12 hours do not consume alcohol, drive, operate machinery, sign legal documents, or work.  Rest at home today.  A responsible adult needs to stay with you today and overnight.  Start with clear liquids and advance as tolerated.  Resume all previous medication unless directed not to.  It is not uncommon to experience an increase in pain for several days after the procedure.  Some people may experience a sunburn-like sensation for a week in the area of the ablation.  If it becomes bothersome despite over the counter NSAIDs (if you can take these), call for further advice.  Ease back into your activities and daily routine as tolerated over the next 2-3 days.  The beneficial effects from the radiofrequency procedure may take several weeks to be noticed.  There should be minimal drainage and no swelling or redness at the injection sites. You should not experience a severe headache. You should not run a fever over 101F. If any of these occur, call to report this to a nurse at [phone]. If you are calling after 4PM or on weekends/holidays, call [phone] and ask to have the on-call pain management resident paged or go to your local emergency room.  Follow up appointment as needed.     If you have questions for the surgery center, call [facility] at [phone].

## 2020-12-24 NOTE — Procedures
Attending Surgeon: Evelina Bucy, MD    Anesthesia: Local    Pre-Procedure Diagnosis:   1. Spondylosis of lumbosacral region without myelopathy or radiculopathy        Post-Procedure Diagnosis:   1. Spondylosis of lumbosacral region without myelopathy or radiculopathy             Destruction of Nerve w/Fluoro Lumbar/Sacral    Laterality: bilateral    Location: Lumbar/Sacral -  L3, L4 and L5      Consent:   Consent obtained: written  Consent given by: patient  Risks discussed: allergic reaction, bleeding, bruising, infection, nerve damage, no change or worsening in pain, reaction to medication, seizure, sensation loss, skin burn, swelling and weakness  Alternatives discussed: alternative treatment, delayed treatment, no treatment and referral  Discussed with patient the purpose of the treatment/procedure, other ways of treating my condition, including no treatment/ procedure and the risks and benefits of the alternatives. Patient has decided to proceed with treatment/procedure.        Universal Protocol:  Relevant documents: relevant documents present and verified  Test results: test results available and properly labeled  Imaging studies: imaging studies available  Required items: required blood products, implants, devices, and special equipment available  Site marked: the operative site was marked  Patient identity confirmed: Patient identify confirmed verbally with patient.        Time out: Immediately prior to procedure a time out was called to verify the correct patient, procedure, equipment, support staff and site/side marked as required      Procedures Details:   Indications: pain     Prep: chlorhexidine  Local anesthetic:  1% lidocaine - 1mL  Sedation: anxiolysisPatient position: prone  Estimated Blood Loss: minimal  Specimens: none  Number of Levels Ablated: 2  Guidance: fluoroscopy  Needle size: 18 G  Active Needle Tip Length: 10mm  Neurolytic Technique: Radiofrequency Ablation      Patient tolerance: Patient tolerated the procedure well with no immediate complications. Pressure was applied, and hemostasis was accomplished.  Comments:   LUMBAR MEDIAL BRANCH RADIOFREQUENCY ABLATION UNDER FLUOROSCOPY    PROCEDURE:  1) bilateral L4-S1 medial branch radiofrequency ablation targeting the L4-5 and L5-S1 joints.   2) Fluoroscopic needle guidance    REASON FOR PROCEDURE: Spondylosis without myleopathy or radiculopathy, facet arthropathy, DDD (lumbosacral)    PHYSICIAN: Evelina Bucy, MD    MEDICATIONS INJECTED: Before the ablation, 1 mL of 2% lidocaine with 1.66mg  dexamethasone was injected at each level.     LOCAL ANESTHETIC INJECTED: 2 mL of 1% lidocaine per site    SEDATION MEDICATIONS: Versed and Fentanyl as per nurse charting.     ESTIMATED BLOOD LOSS: None    SPECIMENS REMOVED: None    COMPLICATIONS: None    TECHNIQUE: Time-out was taken to identify the correct patient, procedure and side prior to starting the procedure. Lying in a prone position, the patient was prepped and draped in the usual sterile fashion using DuraPrep and a fenestrated drape. The levels were determined under fluoroscopy. Local anesthetic was given by raising a skin wheal and going down to the hub of a 27-gauge 1.25-inch needle. A 10mm tip radiofrequency needle was introduced to the anatomic location of the medial branch at the junction of the superior articular process and transverse process utilizing intermittent fluoroscopy. Motor stimulation up to 2 volts was done to confirm no ablation of the ventral ramus at each level. 1 mL of 2% lidocaine was then injected slowly at each level. After waiting  60 seconds, ablation was performed utilizing a radiofrequency generator at 80 degrees C for 90 seconds.     The procedure was completed without complications and was tolerated well. The patient was monitored after the procedure. The patient (or responsible party) was given post-procedure and discharge instructions to follow at home. The patient was discharged in stable condition.     Lelon Huh, MD          Radiofrequency time 90  Radiofrequency Temperature 80        Estimated blood loss: none or minimal  Specimens: none  Patient tolerated the procedure well with no immediate complications. Pressure was applied, and hemostasis was accomplished.

## 2020-12-25 ENCOUNTER — Encounter: Admit: 2020-12-25 | Discharge: 2020-12-25 | Payer: MEDICARE

## 2020-12-25 MED ORDER — ROSUVASTATIN 40 MG PO TAB
40 mg | ORAL_TABLET | Freq: Every evening | ORAL | 3 refills | 90.00000 days | Status: AC
Start: 2020-12-25 — End: ?

## 2020-12-25 MED ORDER — EZETIMIBE 10 MG PO TAB
10 mg | ORAL_TABLET | Freq: Every evening | ORAL | 3 refills | Status: AC
Start: 2020-12-25 — End: ?

## 2020-12-25 MED ORDER — SPIRONOLACTONE 25 MG PO TAB
25 mg | ORAL_TABLET | Freq: Every day | ORAL | 3 refills | 90.00000 days | Status: AC
Start: 2020-12-25 — End: ?

## 2020-12-25 MED ORDER — FUROSEMIDE 40 MG PO TAB
ORAL_TABLET | Freq: Every day | ORAL | 3 refills | 90.00000 days | Status: AC
Start: 2020-12-25 — End: ?

## 2020-12-25 MED ORDER — METOPROLOL SUCCINATE 100 MG PO TB24
100 mg | ORAL_TABLET | Freq: Every day | ORAL | 3 refills | 90.00000 days | Status: AC
Start: 2020-12-25 — End: ?

## 2020-12-27 NOTE — Progress Notes
Date of Service: 12/30/2020       Subjective:             Johnny [name] is a 74 y.o. male.      Chief Complaint   Patient presents with   ? Erectile Dysfunction         History of Present Illness  Very pleasant Caucasian gentleman w/ multiple medical & cardiovascular co-morbidities complaining of chronic history of erectile dysfunction (ED).    (+)difficulty getting erections.  (+)difficulty maintaining erections.      (+)tried prescription ED medications.  (-)taking or access to nitrates or sublingual nitroglycerin.    -- Sildenafil 125 - 200 mg --> (+)partial, but not functional erections.  -- Tadalafil 20 mg --> (+)partial, but not functional erections.    Purchased vacuum erection device (VED) [[facility]] ~ Jan 2022.  -- using VED + constriction ring --> (+)functional erections & satisfactory results.  ---- not tried using PDE-5i w/ VED yet.    Presents to urology clinic today for erectile dysfunction (ED) f/u.      Medical History:   Diagnosis Date   ? Atrial fibrillation (HCC) 02/08/2009   ? Back pain    ? CAD (coronary artery disease) 02/08/2009   ? Carpal tunnel syndrome    ? Chronic cough    ? Congestive heart disease (HCC)    ? COPD (chronic obstructive pulmonary disease) (HCC)    ? Erectile dysfunction, vasculogenic    ? Fracture    ? GERD (gastroesophageal reflux disease)     controlled with elevated HOB, protonix and pepcid    ? Hiatal hernia    ? Hyperlipemia 02/11/2009   ? Hypertension 02/11/2009   ? 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 Mitral regurgitation    ? Myocardial infarction [facility]) 2006   ? Obesity, Class I, 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)    ? OSA on CPAP 02/11/2009   ? Osteoarthritis    ? Pre-diabetes    ? S/P ICD (internal cardiac defibrillator) procedure 02/11/2009   ? Seasonal allergies          Surgical History:   Procedure Laterality Date   ? HUMERUS FRACTURE SURGERY Left 2004   ? MITRAL VALVULOPLASTY  2006    same time as bypass   ? CORONARY ARTERY BYPASS GRAFT  04/14/05    CABGx5 LIMA-LAD, L Rad-Dx, sSVG-RCA-RCA, sSVG-OM & MV Plasty for MR:   ? RHYTHM DEVICE PLACEMENT  2007    defibrillator   ? FOOT SURGERY Bilateral 2012    Tarsal tunnel release w/ bunionectomy and hammer toe    ? EAR SURGERY Bilateral 2012    Ear Tubes   ? SINUS SURGERY  10/2011   ? CARDIOVASCULAR STRESS TEST  09/2013   ? KNEE REPLACEMENT Right 05/2014   ? 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 TRANSVENOUS REMOVAL IMPLANTABLE DEFIBRILLATOR RIGHT VENTRICULAR LEAD Left 03/13/2018    Performed by Kathreen Cornfield, MD at [facility] CVOR   ? INSERTION RIGHT VENTRICULAR LEAD Left 03/13/2018    Performed by Kathreen Cornfield, MD at [facility] CVOR   ? FLUOROSCOPY - CARDIAC  03/13/2018    Performed by Kathreen Cornfield, MD at [facility] CVOR   ? INSERTION/ REPLACEMENT TEMPORARY PACEMAKER LEAD/ CATHETER  03/13/2018    Performed by Kathreen Cornfield, MD at [facility] CVOR   ?  REMOVAL AND REPLACEMENT IMPLANTABLE DEFIBRILLATOR GENERATOR - DUAL LEAD SYSTEM  03/13/2018    Performed by Kathreen Cornfield, MD at [facility] CVOR   ? CARDIOTHORACIC SURGERY STANDBY N/A 03/13/2018    Performed by Cath, Physician at [facility] CVOR   ? INTRACARDIAC CATHETER ABLATION WITH COMPREHENSIVE ELECTROPHYSIOLOGIC EVALUATION - ATRIAL FIBRILLATION, RADIOFREQUENCY N/A 11/22/2018    Performed by Kathreen Cornfield, MD at [facility] EP LAB   ? INTRACARDIAC ELECTROPHYSIOLOGIC 3-DIMENSIONAL MAPPING N/A 11/22/2018    Performed by Kathreen Cornfield, MD at [facility] EP LAB   ? INTRACARDIAC ECHOCARDIOGRAPHY N/A 11/22/2018    Performed by Kathreen Cornfield, MD at [facility] EP LAB   ? POSSIBLE INTRAVENOUS DRUG INFUSION FOR STIMULATION AND PACING N/A 11/22/2018    Performed by Kathreen Cornfield, MD at [facility] EP LAB   ? POSSIBLE INSERTION ESOPHAGEAL DEVIATOR N/A 11/22/2018    Performed by Kathreen Cornfield, MD at [facility] EP LAB   ? POSSIBLE EXTERNAL CARDIOVERSION N/A 11/22/2018    Performed by Kathreen Cornfield, MD at [facility] EP LAB   ? INTRACARDIAC CATHETER ABLATION WITH COMPREHENSIVE ELECTROPHYSIOLOGIC EVALUATION - ATYPICAL FLUTTER Right 11/22/2018    Performed by Kathreen Cornfield, MD at [facility] EP LAB   ? TRANSESOPHAGEAL ECHOCARDIOGRAM DURING INTERVENTION N/A 11/22/2018    Performed by Cath, Physician at [facility] EP LAB   ? CARDIAC DEFIBRILLATOR PLACEMENT  11/2013, 02-2018    S/P [employer] ICD Lead Extraction and New Lead Implantation   ? HX BACK SURGERY  2013, 2006    Lumbart Laminectomy and Fusion   ? HX CARPAL TUNNEL RELEASE Right 1990s   ? HX HEART CATHETERIZATION              Review of Systems      Objective:         ? acetaminophen (TYLENOL) 325 mg tablet Take two tablets by mouth every 4 hours as needed for Pain.   ? albuterol (VENTOLIN HFA, PROAIR HFA) 90 mcg/actuation inhaler Inhale 2 Puffs by mouth four times daily as needed for Wheezing.   ? aspirin 81 mg chewable tablet Take 1 Tab by mouth daily. WAIT to restart until finished taking lovenox injections. (Patient taking differently: Chew 81 mg by mouth at bedtime daily.)   ? cetirizine (ZYRTEC) 10 mg tablet Take 10 mg by mouth daily.   ? cholecalciferol (Vitamin D3) (VITAMIN D-3) 1,000 units tablet Take 1,000 Units by mouth twice daily.     ? dextromethorphan/guaiFENesin (MUCINEX DM) 30/600 mg Tb12 Take 2 tablets by mouth daily as needed.   ? docusate (COLACE) 100 mg capsule Take 200 mg by mouth at bedtime daily. Pt is taking 550 mg daily   ? ezetimibe (ZETIA) 10 mg tablet Take one tablet by mouth at bedtime daily.   ? famotidine (PEPCID) 40 mg tablet Take 40 mg by mouth at bedtime daily.   ? fexofenadine(+) (ALLEGRA) 180 mg tablet Take 180 mg by mouth daily.   ? fluticasone-umeclidin-vilanter (TRELEGY ELLIPTA) 100-62.5-25 mcg dsdv Inhale 1 Dose by mouth into the lungs at bedtime daily.   ? furosemide (LASIX) 40 mg tablet TAKE 2 TABLETS ONCE DAILY, TAKE 3 TABLETS ON Monday/WEDNESDAY/FRIDAY AS DIRECTED BY       CARDIOLOGY   ? gabapentin (NEURONTIN) 300 mg capsule Take 300 mg by mouth every 6 hours.   ? glucosamine su 2KCl-chondroit 500-400 mg tab Take 1 Tab by mouth twice daily.   ? ipratropium bromide (ATROVENT) 42 mcg (0.06 %) nasal spray Apply 2 sprays  to each nostril as directed twice daily as needed.   ? metoprolol XL (TOPROL XL) 100 mg extended release tablet Take one tablet by mouth daily.   ? mucus clearing device (AEROBIKA OSCILLATING PEP SYSTM MISC) Use  as directed. Use with salt water and inhale twice daily   ? MYRBETRIQ 50 mg tablet Take 1 tablet by mouth daily. Does not take on days he takes Ditropan   ? OMEGA-3 FATTY ACIDS-FISH OIL PO Take 1 capsule by mouth twice daily.   ? other medication mupirocin ointment and budesonide solution mixed with warm water  Use as nasal irrigation twice daily   ? oxybutynin chloride (DITROPAN) 5 mg tablet Take 5 mg by mouth twice daily. Does not take on days he takes Myrbetriq   ? pantoprazole DR (PROTONIX) 40 mg tablet Take 40 mg by mouth twice daily.   ? polyethylene glycol 3350 (GLYCOLAX; MIRALAX) 17 gram/dose powder Take 17 g by mouth daily. (Patient taking differently: Take 17 g by mouth nightly as needed.)   ? potassium chloride SR (K-DUR) 20 mEq tablet Take one tab on Sunday, Tuesday, Thursday, and Saturday.  Take two tabs on Mon, Wed and Friday.   ? rosuvastatin (CRESTOR) 40 mg tablet Take one tablet by mouth at bedtime daily.   ? sacubitriL-valsartan (ENTRESTO) 49-51 mg tablet Take one tablet by mouth twice daily.   ? sildenafil(+) (VIAGRA) 100 mg tablet Take 1 Tab by mouth as Needed for Erectile dysfunction.   ? spironolactone (ALDACTONE) 25 mg tablet Take one tablet by mouth daily. Take with food.   ? tamsulosin (FLOMAX) 0.4 mg capsule Take 0.4 mg by mouth daily. Do not crush, chew or open capsules. Take 30 minutes following the same meal each day.   ? testosterone cypionate 200 mg/mL kit Inject 1 mL into the muscle every 14 days.   ? tiZANidine (ZANAFLEX) 2 mg tablet Take three tablets by mouth at bedtime as needed. 1 to 3 tabs qhs prn   ? traMADoL (ULTRAM) 50 mg tablet every 4-6 hours   ? traZODone (DESYREL) 100 mg tablet bedtime   ? Vacuum Erection Device System kit Use as directed for sexual activity.   ? XARELTO 20 mg tablet TAKE 1 TABLET BY MOUTH DAILY - MUST BE WITH EVENING MEAL         Vitals:    12/30/20 1013   BP: 124/78   Pulse: 82   Resp: 18   PainSc: Two   Weight: 108.9 kg (240 lb)   Height: 182.9 cm (6')       Body mass index is 32.55 kg/m?Marland Kitchen       Physical Exam  Vitals reviewed.   Constitutional:       General: He is not in acute distress.     Appearance: He is well-developed.   HENT:      Head: Normocephalic and atraumatic.   Pulmonary:      Effort: Pulmonary effort is normal. No respiratory distress.   Neurological:      Mental Status: He is alert and oriented to person, place, and time.   Psychiatric:         Behavior: Behavior normal.         Thought Content: Thought content normal.         Judgment: Judgment normal.              Assessment and Plan:    Problem   Erectile dysfunction, vasculogenic  Erectile dysfunction, vasculogenic  Continue vacuum erection device (VED) + constriction ring +/- PDE-5i prn.  RTC 1 yr for ED f/u.               Marin Roberts, PA-C  Urology

## 2020-12-30 ENCOUNTER — Encounter: Admit: 2020-12-30 | Discharge: 2020-12-30 | Payer: MEDICARE

## 2020-12-30 ENCOUNTER — Ambulatory Visit: Admit: 2020-12-30 | Discharge: 2020-12-30 | Payer: MEDICARE

## 2020-12-30 ENCOUNTER — Ambulatory Visit: Admit: 2020-12-30 | Discharge: 2020-12-31 | Payer: MEDICARE

## 2020-12-30 DIAGNOSIS — I340 Nonrheumatic mitral (valve) insufficiency: Secondary | ICD-10-CM

## 2020-12-30 DIAGNOSIS — K449 Diaphragmatic hernia without obstruction or gangrene: Secondary | ICD-10-CM

## 2020-12-30 DIAGNOSIS — G4733 Obstructive sleep apnea (adult) (pediatric): Secondary | ICD-10-CM

## 2020-12-30 DIAGNOSIS — I2510 Atherosclerotic heart disease of native coronary artery without angina pectoris: Secondary | ICD-10-CM

## 2020-12-30 DIAGNOSIS — R053 Chronic cough: Secondary | ICD-10-CM

## 2020-12-30 DIAGNOSIS — E785 Hyperlipidemia, unspecified: Secondary | ICD-10-CM

## 2020-12-30 DIAGNOSIS — G5600 Carpal tunnel syndrome, unspecified upper limb: Secondary | ICD-10-CM

## 2020-12-30 DIAGNOSIS — R7303 Prediabetes: Secondary | ICD-10-CM

## 2020-12-30 DIAGNOSIS — I4891 Unspecified atrial fibrillation: Secondary | ICD-10-CM

## 2020-12-30 DIAGNOSIS — K219 Gastro-esophageal reflux disease without esophagitis: Secondary | ICD-10-CM

## 2020-12-30 DIAGNOSIS — G4752 REM sleep behavior disorder: Secondary | ICD-10-CM

## 2020-12-30 DIAGNOSIS — N529 Male erectile dysfunction, unspecified: Secondary | ICD-10-CM

## 2020-12-30 DIAGNOSIS — J449 Chronic obstructive pulmonary disease, unspecified: Secondary | ICD-10-CM

## 2020-12-30 DIAGNOSIS — N5203 Combined arterial insufficiency and corporo-venous occlusive erectile dysfunction: Secondary | ICD-10-CM

## 2020-12-30 DIAGNOSIS — I219 Acute myocardial infarction, unspecified: Secondary | ICD-10-CM

## 2020-12-30 DIAGNOSIS — E669 Obesity, unspecified: Secondary | ICD-10-CM

## 2020-12-30 DIAGNOSIS — Z95810 Presence of automatic (implantable) cardiac defibrillator: Secondary | ICD-10-CM

## 2020-12-30 DIAGNOSIS — I509 Heart failure, unspecified: Secondary | ICD-10-CM

## 2020-12-30 DIAGNOSIS — T148XXA Other injury of unspecified body region, initial encounter: Secondary | ICD-10-CM

## 2020-12-30 DIAGNOSIS — J302 Other seasonal allergic rhinitis: Secondary | ICD-10-CM

## 2020-12-30 DIAGNOSIS — I255 Ischemic cardiomyopathy: Secondary | ICD-10-CM

## 2020-12-30 DIAGNOSIS — I10 Essential (primary) hypertension: Secondary | ICD-10-CM

## 2020-12-30 DIAGNOSIS — M1990 Unspecified osteoarthritis, unspecified site: Secondary | ICD-10-CM

## 2020-12-30 DIAGNOSIS — M549 Dorsalgia, unspecified: Secondary | ICD-10-CM

## 2020-12-30 NOTE — Telephone Encounter
Call placed to patient to see if he was currently using a CPAP.  Patient stated yes he is using one but it is very old.  It is a F& P and it doesn't have any data capabilities so we are unable to obtain any information from it.      Patient had a sleep study here at [facility] in 6/21 however Cardiologist is not satisfied with the attempted adjustments that were made during the study so he is wanting another study to be done.      Patient advised that in order to get him a new machine, we will need for him to have the visit as scheduled today, then we will order him a new study and then we can go from there.  Patient voiced understanding.

## 2020-12-30 NOTE — Assessment & Plan Note
Continue vacuum erection device (VED) + constriction ring +/- PDE-5i prn.  RTC 1 yr for ED f/u.

## 2020-12-30 NOTE — Progress Notes
Telehealth Visit Note    Date of Service: 12/30/2020     Chief Complaint/Reason for Referral: Sleep apnea    Referring Provider: Mable Paris, MD  [address] [facility] [id number]  [place],  [place] [address]       Subjective:      Obtained patient's verbal consent to treat them and their agreement to [facility] financial policy and NPP via this telehealth visit during the [facility]       Johnny [name] Ricky Gallery is a 75 y.o. male.    History of Present Illness  Patient originally diagnosed with OSA in the 1990s. He is not sure of the severity. He notes that his most recent diagnostic study was in 2003.He has used 2 machines since being diagnosed. Received last machine around 2003 and hasn't updated machine since. His current machine is an F&P device. Data not available for review.     He uses CPAP machine every night. Brings machine with him when he travels. Sure misses machine if he doesn't use. Awakens during the night if he doesn't use it.      Patient had a CPAP titration through [facility] in June, 2021. Recommended pressure of 6 was lower than what patient was used to with his machine at home. He previously used a pressure of 12 cm H20. He notes that he never lowered pressure beyond 7.5 cm H20. Using [facility] in [place].   Wife noticed snoring when his machine was set to the lower pressures.     He saw Dr. Raleigh Callas in February for tremor evaluation. There was not bradykinesia or rigidity noted. There was increased muscle tone in REM noted on his titration study in June. Wife notes episodes of dream enactment for the past 8-10 years, at least once a week. Frequency hasn't changed. Episodes happen during the second half of the night. Most episodes characterized by yelling. Sometimes legs/arms move. Wife denies witnessing violent movements. No bodily injuries to patient or wife. Patient denies unpleasant dreams.     Bedtime: 11 PM to 12 AM  Sleep Aid: Trazodone 100 mg, managed by PCP. Patient reports tolerating this medication. Denies residual grogginess.   Sleep onset latency: Within 20-30 minutes on average. May take longer if he had a late meal or forgot to take Trazodone.   Awakenings: 0-1  Wake time: 8 AM    Naps: Generally no.         The patient's Epworth Sleepiness Scale Score is 8/24. Denies drowsy driving.   STOPBANG Score: 7  If score > 3 there is a high probability that they have OSA.      Medical History:   Diagnosis Date   ? Atrial fibrillation (HCC) 02/08/2009   ? Back pain    ? CAD (coronary artery disease) 02/08/2009   ? Carpal tunnel syndrome    ? Chronic cough    ? Congestive heart disease (HCC)    ? COPD (chronic obstructive pulmonary disease) (HCC)    ? Erectile dysfunction, vasculogenic    ? Fracture    ? GERD (gastroesophageal reflux disease)     controlled with elevated HOB, protonix and pepcid    ? Hiatal hernia    ? Hyperlipemia 02/11/2009   ? Hypertension 02/11/2009   ? 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 Mitral regurgitation    ? Myocardial infarction [facility]) 2006   ? Obesity, Class I, BMI  30.0-34.9 (see actual BMI)    ? OSA on CPAP 02/11/2009   ? Osteoarthritis    ? Pre-diabetes    ? S/P ICD (internal cardiac defibrillator) procedure 02/11/2009   ? Seasonal allergies        Sleep Family History:  Not that he is aware of.       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   No smoking since college.  Alcohol- 1-2 drinks every 1-3 months.       Depression Screening was performed on Theophilus Wildeboer in clinic today. Based on the score of 0, no follow up action or recommendations are necessary at this time.    Review of Systems   Constitutional: Positive for activity change, appetite change and fatigue.   HENT: Positive for dental problem and hearing loss.    Eyes: Positive for visual disturbance.   Respiratory: Negative for shortness of breath (COPD History ).    Cardiovascular: Positive for leg swelling. Gastrointestinal: Negative.    Endocrine: Negative.    Genitourinary: Negative.    Musculoskeletal: Positive for arthralgias.   Skin: Negative.    Allergic/Immunologic: Negative.    Neurological: Positive for tremors.   Hematological: Does not bruise/bleed easily (On blood thinners ).   Psychiatric/Behavioral: Negative.          Objective:         ? acetaminophen (TYLENOL) 325 mg tablet Take two tablets by mouth every 4 hours as needed for Pain.   ? albuterol (VENTOLIN HFA, PROAIR HFA) 90 mcg/actuation inhaler Inhale 2 Puffs by mouth four times daily as needed for Wheezing.   ? aspirin 81 mg chewable tablet Take 1 Tab by mouth daily. WAIT to restart until finished taking lovenox injections. (Patient taking differently: Chew 81 mg by mouth at bedtime daily.)   ? cetirizine (ZYRTEC) 10 mg tablet Take 10 mg by mouth daily.   ? cholecalciferol (Vitamin D3) (VITAMIN D-3) 1,000 units tablet Take 1,000 Units by mouth twice daily.     ? dextromethorphan/guaiFENesin (MUCINEX DM) 30/600 mg Tb12 Take 2 tablets by mouth daily as needed.   ? docusate (COLACE) 100 mg capsule Take 200 mg by mouth at bedtime daily. Pt is taking 550 mg daily   ? ezetimibe (ZETIA) 10 mg tablet Take one tablet by mouth at bedtime daily.   ? famotidine (PEPCID) 40 mg tablet Take 40 mg by mouth at bedtime daily.   ? fexofenadine(+) (ALLEGRA) 180 mg tablet Take 180 mg by mouth daily.   ? fluticasone-umeclidin-vilanter (TRELEGY ELLIPTA) 100-62.5-25 mcg dsdv Inhale 1 Dose by mouth into the lungs at bedtime daily.   ? furosemide (LASIX) 40 mg tablet TAKE 2 TABLETS ONCE DAILY, TAKE 3 TABLETS ON Monday/WEDNESDAY/FRIDAY AS DIRECTED BY       CARDIOLOGY   ? gabapentin (NEURONTIN) 300 mg capsule Take 300 mg by mouth every 6 hours.   ? glucosamine su 2KCl-chondroit 500-400 mg tab Take 1 Tab by mouth twice daily.   ? ipratropium bromide (ATROVENT) 42 mcg (0.06 %) nasal spray Apply 2 sprays to each nostril as directed twice daily as needed.   ? metoprolol XL (TOPROL XL) 100 mg extended release tablet Take one tablet by mouth daily.   ? mucus clearing device (AEROBIKA OSCILLATING PEP SYSTM MISC) Use  as directed. Use with salt water and inhale twice daily   ? MYRBETRIQ 50 mg tablet Take 1 tablet by mouth daily. Does not take on days he takes Ditropan   ? OMEGA-3 FATTY  ACIDS-FISH OIL PO Take 1 capsule by mouth twice daily.   ? other medication mupirocin ointment and budesonide solution mixed with warm water  Use as nasal irrigation twice daily   ? oxybutynin chloride (DITROPAN) 5 mg tablet Take 5 mg by mouth twice daily. Does not take on days he takes Myrbetriq   ? pantoprazole DR (PROTONIX) 40 mg tablet Take 40 mg by mouth twice daily.   ? polyethylene glycol 3350 (GLYCOLAX; MIRALAX) 17 gram/dose powder Take 17 g by mouth daily. (Patient taking differently: Take 17 g by mouth nightly as needed.)   ? potassium chloride SR (K-DUR) 20 mEq tablet Take one tab on Sunday, Tuesday, Thursday, and Saturday.  Take two tabs on Mon, Wed and Friday.   ? rosuvastatin (CRESTOR) 40 mg tablet Take one tablet by mouth at bedtime daily.   ? sacubitriL-valsartan (ENTRESTO) 49-51 mg tablet Take one tablet by mouth twice daily.   ? sildenafil(+) (VIAGRA) 100 mg tablet Take 1 Tab by mouth as Needed for Erectile dysfunction.   ? spironolactone (ALDACTONE) 25 mg tablet Take one tablet by mouth daily. Take with food.   ? tamsulosin (FLOMAX) 0.4 mg capsule Take 0.4 mg by mouth daily. Do not crush, chew or open capsules. Take 30 minutes following the same meal each day.   ? testosterone cypionate 200 mg/mL kit Inject 1 mL into the muscle every 14 days.   ? tiZANidine (ZANAFLEX) 2 mg tablet Take three tablets by mouth at bedtime as needed. 1 to 3 tabs qhs prn   ? traMADoL (ULTRAM) 50 mg tablet every 4-6 hours   ? traZODone (DESYREL) 100 mg tablet bedtime   ? Vacuum Erection Device System kit Use as directed for sexual activity.   ? XARELTO 20 mg tablet TAKE 1 TABLET BY MOUTH DAILY - MUST BE WITH EVENING MEAL   *He is not taking tramadol.        Telehealth Patient Reported Vitals     Row Name 12/30/20 1410                BP: 124/78        Pulse: 82        Weight: 108.9 kg (240 lb)        Height: 182.9 cm (6')        Pain Score: Five        Pain Location: BACK                  Telehealth Body Mass Index: 32.55 at 12/30/2020  7:28 PM    Physical Exam  Alert, no acute distress. Pleasant and cooperative with history and evaluation.          Assessment and Plan:    Problem   Osa (Obstructive Sleep Apnea)    CPAP titration (03/13/20);  6 CM H20 recommended  PLMI 4.4, PLMAI 0.6  Increased muscle tone noted during some epochs of REM     Rem Sleep Behavior Disorder       OSA (obstructive sleep apnea)  History of OSA on CPAP. Patient reports compliance and experiences symptomatic benefit. Titration study in June recommended CPAP 6 cm H20. Patient notes that pressure was too low and needs higher pressure to address his symptoms. Original diagnostic study is not available for review and he has an old machine. He is due for new equipment.  He has multiple cardiovascular co-morbidities, as well as COPD, GERD, ED, which could be exacerbated by untreated sleep apnea.  Order split night sleep study to evaluate baseline severity and breakdown of events (obstructive vs.central) followed by titration to evaluate for optimal pressure setting and modality.   Will also monitor for presence of central apneas given history of CHF and atrial fibrillation.   Various aspects of CPAP usage and compliance were discussed with patient.    Discussed role of weight loss in treatment of OSA.      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 Increased muscle tone during REM slep was noted during his titration study in June.   I counseled the patient extensively on maintaining a safe sleep environment for the patient and bed partner. I recommend keeping any sharp objects outside of the bedroom, any weapons locked in safe, padding corners of furniture, covering glass windows or doors with curtains, keeping bed low to ground, considering bed alarm sensors or on bedroom door, and consider sleeping in separate bed from partner. The patient understands and agrees to pursue safety precautions. We discussed the association of REM Sleep Behavior Disorder (RBD) with neurodegenerative disorders such as Parkinson Disease.   Patient was seen by Dr. Raleigh Callas for tremor. No bradykinesia or rigidity was noted at that time. Continue to follow up in movement disorders clinic.   We discussed trial of melatonin 3 mg SR at bedtime. Potential adverse effects were discussed. There is room to increase as needed and as tolerated. Patient was advised to update the clinic if he has recurrent dream enactment episodes.   Will include arm leads during sleep study.       Patient also has history of insomnia, currently on Trazodone managed by PCP. Patient reports benefit from medication and is tolerating.     The risks associated with driving or operating heavy machinery while sleepy were discussed with the patient, who understands and agrees to take measures to eliminate these risks. Patient was advised not to drive if drowsy.    Follow up 4-6 weeks after setup with new PAP machine, sooner if needed.  A CPAP download will be obtained to evaluate compliance and efficacy of treatment.     Total Time Today was over 60 minutes in the following activities: Preparing to see the patient, Obtaining and/or reviewing separately obtained history, Performing a medically appropriate examination and/or evaluation, Counseling and educating the patient/family/caregiver, Ordering medications, tests, or procedures, Referring and communication with other health care professionals (when not separately reported), Documenting clinical information in the electronic or other health record and Independently interpreting results (not separately reported) and communicating results to the patient/family/caregiver

## 2020-12-31 ENCOUNTER — Encounter: Admit: 2020-12-31 | Discharge: 2020-12-31 | Payer: MEDICARE

## 2020-12-31 DIAGNOSIS — G4730 Sleep apnea, unspecified: Secondary | ICD-10-CM

## 2020-12-31 DIAGNOSIS — N5203 Combined arterial insufficiency and corporo-venous occlusive erectile dysfunction: Secondary | ICD-10-CM

## 2020-12-31 DIAGNOSIS — I480 Paroxysmal atrial fibrillation: Secondary | ICD-10-CM

## 2020-12-31 NOTE — Assessment & Plan Note
History of OSA on CPAP. Patient reports compliance and experiences symptomatic benefit. Titration study in June recommended CPAP 6 cm H20. Patient notes that pressure was too low and needs higher pressure to address his symptoms. Original diagnostic study is not available for review and he has an old machine. He is due for new equipment.  He has multiple cardiovascular co-morbidities, as well as COPD, GERD, ED, which could be exacerbated by untreated sleep apnea.   Order split night sleep study to evaluate baseline severity and breakdown of events (obstructive vs.central) followed by titration to evaluate for optimal pressure setting and modality.   Will also monitor for presence of central apneas given history of CHF and atrial fibrillation.   Various aspects of CPAP usage and compliance were discussed with patient.    Discussed role of weight loss in treatment of OSA.

## 2020-12-31 NOTE — Assessment & Plan Note
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 Increased muscle tone during REM slep was noted during his titration study in June.   I counseled the patient extensively on maintaining a safe sleep environment for the patient and bed partner. I recommend keeping any sharp objects outside of the bedroom, any weapons locked in safe, padding corners of furniture, covering glass windows or doors with curtains, keeping bed low to ground, considering bed alarm sensors or on bedroom door, and consider sleeping in separate bed from partner. The patient understands and agrees to pursue safety precautions. We discussed the association of REM Sleep Behavior Disorder (RBD) with neurodegenerative disorders such as Parkinson Disease.   Patient was seen by Dr. Raleigh Callas for tremor. No bradykinesia or rigidity was noted at that time. Continue to follow up in movement disorders clinic.   We discussed trial of melatonin 3 mg SR at bedtime. Potential adverse effects were discussed. There is room to increase as needed and as tolerated. Patient was advised to update the clinic if he has recurrent dream enactment episodes.   Will include arm leads during sleep study.

## 2021-01-07 ENCOUNTER — Encounter: Admit: 2021-01-07 | Discharge: 2021-01-07 | Payer: MEDICARE

## 2021-01-07 MED ORDER — ENTRESTO 49-51 MG PO TAB
1 | ORAL_TABLET | Freq: Two times a day (BID) | ORAL | 11 refills | Status: AC
Start: 2021-01-07 — End: ?

## 2021-01-12 ENCOUNTER — Encounter: Admit: 2021-01-12 | Discharge: 2021-01-12 | Payer: MEDICARE

## 2021-01-12 NOTE — Telephone Encounter
Received notification that  [employer] Carelink has not been connected since 11/25/20. Patient was scheduled for a [employer] Carelink on 12/14/20 that has not been received.   Patient was instructed to look at his/her transmitter to make sure that it is plugged into power and send a manual transmission to reconnect the transmitter. If he/she has any questions about how to send a transmission or if the transmitter does not appear to be working properly, they need to contact the device company directly. Patient was provided with that contact number. Requested the patient send [place] a MyChart message or contact our device nurses at [phone] to let us know after they have sent their transmission. Called preferred phone number, Spoke with [name], He will take a look at it,    Patient verbalized understanding.    CDJ

## 2021-01-15 ENCOUNTER — Encounter: Admit: 2021-01-15 | Discharge: 2021-01-15 | Payer: MEDICARE

## 2021-02-19 ENCOUNTER — Encounter: Admit: 2021-02-19 | Discharge: 2021-02-19 | Payer: MEDICARE

## 2021-02-19 MED ORDER — POTASSIUM CHLORIDE 20 MEQ PO TBTQ
ORAL_TABLET | 3 refills | 30.00000 days | Status: AC
Start: 2021-02-19 — End: ?

## 2021-02-22 ENCOUNTER — Ambulatory Visit: Admit: 2021-02-22 | Discharge: 2021-02-22 | Payer: MEDICARE

## 2021-02-22 ENCOUNTER — Encounter: Admit: 2021-02-22 | Discharge: 2021-02-22 | Payer: MEDICARE

## 2021-02-22 DIAGNOSIS — M25561 Pain in right knee: Secondary | ICD-10-CM

## 2021-02-22 DIAGNOSIS — M5417 Radiculopathy, lumbosacral region: Secondary | ICD-10-CM

## 2021-02-22 DIAGNOSIS — M461 Sacroiliitis, not elsewhere classified: Secondary | ICD-10-CM

## 2021-02-22 DIAGNOSIS — R52 Pain, unspecified: Secondary | ICD-10-CM

## 2021-02-22 MED ORDER — GABAPENTIN 300 MG PO CAP
300 mg | ORAL_CAPSULE | ORAL | 3 refills | Status: AC
Start: 2021-02-22 — End: ?

## 2021-02-22 NOTE — Patient Instructions
General Instructions:   How to reach me: Please send a MyChart message to the [facility] or leave a voicemail for my nurse Rebecca at [phone].   Scheduling: Our scheduling phone number is [phone].   How to get a medication refill: Five business days before refill needed, please use the MyChart Refill request or contact your pharmacy directly to request medication refills.    How to receive your test results: If you have signed up for MyChart, you will receive your test results and messages from me this way. Otherwise, you will get a phone call or letter. If you are expecting results and have not heard from my office within 2 weeks of your testing, please send a MyChart message or call my office.   Support for many chronic illnesses is available through [facility]: [facility] or [phone].   For questions on nights, weekends or holidays, call the operator at [phone], and ask for the doctor on call for [facility].  

## 2021-02-24 ENCOUNTER — Encounter: Admit: 2021-02-24 | Discharge: 2021-02-24 | Payer: MEDICARE

## 2021-02-25 ENCOUNTER — Encounter: Admit: 2021-02-25 | Discharge: 2021-02-25 | Payer: MEDICARE

## 2021-02-25 ENCOUNTER — Ambulatory Visit: Admit: 2021-02-25 | Discharge: 2021-02-25 | Payer: MEDICARE

## 2021-02-25 DIAGNOSIS — M1711 Unilateral primary osteoarthritis, right knee: Secondary | ICD-10-CM

## 2021-02-25 DIAGNOSIS — M792 Neuralgia and neuritis, unspecified: Secondary | ICD-10-CM

## 2021-02-25 DIAGNOSIS — M25561 Pain in right knee: Secondary | ICD-10-CM

## 2021-02-25 MED ORDER — BUPIVACAINE (PF) 0.5 % (5 MG/ML) IJ SOLN
3 mL | Freq: Once | INTRAMUSCULAR | 0 refills | Status: CP
Start: 2021-02-25 — End: ?

## 2021-02-25 MED ORDER — BUPIVACAINE (PF) 0.5 % (5 MG/ML) IJ SOLN
2 mL | Freq: Once | INTRAMUSCULAR | 0 refills | Status: CP | PRN
Start: 2021-02-25 — End: ?

## 2021-02-25 MED ORDER — LIDOCAINE (PF) 10 MG/ML (1 %) IJ SOLN
2 mL | Freq: Once | INTRAMUSCULAR | 0 refills | Status: CP
Start: 2021-02-25 — End: ?

## 2021-02-25 NOTE — Progress Notes
I have examined the patient, and there are no significant changes in their condition, from the previous H&P performed on 02/22/21.     Right genicular nerve injection        [facility] - Interventional Pain  Follow Up Visit   Subjective     Chief Complaint:   No chief complaint on file.  Pain    HPI: Johnny [name] is a 75 y.o. 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 who presents for evaluation.    The patient has 2 separate pain complaints.     Right knee pain  Has a history of TKA  Complicated by hardware issue  Revision performed  Persistent anterior knee pain  Described as aching stabbing  Worse with standing, walking   Improved with sitting down, rest   Voltaren gel helps   No new issues on imaging per patient report  This is the worst of his current pain complaints    Separately, there is pain in the low back.  Primarily in the center   Radiation across the back in a band like fashion  There is pain into the buttocks.   There is some radiation of the pain along the posterolateral aspect of the left leg   Typically goes to just the level of the knee   No significant imporvement for greater than 2 months with the RFA  However, upon fruther probing it seems that the pain is a bit different  Pain is a bit lower now than it previously was  Worse with sitting for a long time   Worse with internal rotation of the hip   R is worse than L but both are bothrsome              PRIOR MEDICATIONS:   Effective  Acetaminophen (little)    Ineffective  Gabapentin    Unable to tolerate  NSAID    Never  Lyrica  Ami/Nortriptyline  Cymbalta  Tizanidine      PRIOR INTERVENTIONS:  L-spine surgery with hardware L2-3 L3-4 and then later L4-5 and L5-1 next (2 surgeries, most recent in 2008)  Effective  Bilateral SIJ (OSH, good benefit)    Ineffective  ESI x3 (OSH)        Johnny [name] denies any recent fevers, chills, infection, antibiotics, bowel or bladder incontinence, saddle anesthesia. +Xarelto for a. fib.      ROS: All 14 systems reviewed and found to be negative except as above and as follows. +fatigue, joint pains.    Past Medical History:  Medical History:   Diagnosis Date   ? Atrial fibrillation (HCC) 02/08/2009   ? Back pain    ? CAD (coronary artery disease) 02/08/2009   ? Carpal tunnel syndrome    ? Chronic cough    ? Congestive heart disease (HCC)    ? COPD (chronic obstructive pulmonary disease) (HCC)    ? Erectile dysfunction, vasculogenic    ? Fracture    ? GERD (gastroesophageal reflux disease)  controlled with elevated HOB, protonix and pepcid    ? Hiatal hernia    ? Hyperlipemia 02/11/2009   ? Hypertension 02/11/2009   ? 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 Mitral regurgitation    ? Myocardial infarction [facility]) 2006   ? Obesity, Class I, 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)    ? OSA on CPAP 02/11/2009   ? Osteoarthritis    ? Pre-diabetes    ? S/P ICD (internal cardiac defibrillator) procedure 02/11/2009   ? Seasonal allergies        Family History:  Family History   Problem Relation Age of Onset   ? Cancer Father    ? Hypertension Mother        Social History:  Lives in [place] [address] (1.25 hours away)    Social History Socioeconomic History   ? Marital status: Married   Tobacco Use   ? Smoking status: Never Smoker   ? Smokeless tobacco: Never Used   Vaping Use   ? Vaping Use: Never used   Substance and Sexual Activity   ? Alcohol use: No     Alcohol/week: 0.0 standard drinks     Comment: very rarely 4 drinks per year   ? Drug use: No       Allergies:  Allergies   Allergen Reactions   ? Clarithromycin RASH     Allergy recorded in SMS: Biaxin~Reactions: RASH~THRUSH   ? Ketoconazole SEE COMMENTS     Had bloodshot eyes after use, and sore.   ? Lisinopril COUGH       Medications:    Current Outpatient Medications:   ?  acetaminophen (TYLENOL) 325 mg tablet, Take two tablets by mouth every 4 hours as needed for Pain., Disp: 300 tablet, Rfl: 1  ?  albuterol (VENTOLIN HFA, PROAIR HFA) 90 mcg/actuation inhaler, Inhale 2 Puffs by mouth four times daily as needed for Wheezing., Disp: , Rfl:   ?  aspirin 81 mg chewable tablet, Take 1 Tab by mouth daily. WAIT to restart until finished taking lovenox injections. (Patient taking differently: Chew 81 mg by mouth at bedtime daily.), Disp: 90 Tab, Rfl: 3  ?  cetirizine (ZYRTEC) 10 mg tablet, Take 10 mg by mouth daily., Disp: , Rfl:   ?  cholecalciferol (Vitamin D3) (VITAMIN D-3) 1,000 units tablet, Take 1,000 Units by mouth twice daily.  , Disp: , Rfl:   ?  dextromethorphan/guaiFENesin (MUCINEX DM) 30/600 mg Tb12, Take 2 tablets by mouth daily as needed., Disp: , Rfl:   ?  docusate (COLACE) 100 mg capsule, Take 200 mg by mouth at bedtime daily. Pt is taking 550 mg daily, Disp: , Rfl:   ?  ezetimibe (ZETIA) 10 mg tablet, Take one tablet by mouth at bedtime daily., Disp: 90 tablet, Rfl: 3  ?  famotidine (PEPCID) 40 mg tablet, Take 40 mg by mouth at bedtime daily., Disp: , Rfl:   ?  fexofenadine(+) (ALLEGRA) 180 mg tablet, Take 180 mg by mouth daily., Disp: , Rfl:   ?  fluticasone-umeclidin-vilanter (TRELEGY ELLIPTA) 100-62.5-25 mcg inhaler, Inhale 1 Dose by mouth into the lungs at bedtime daily., Disp: , Rfl:   ?  furosemide (LASIX) 40 mg tablet, TAKE 2 TABLETS ONCE DAILY, TAKE 3 TABLETS ON Monday/WEDNESDAY/FRIDAY AS DIRECTED BY       CARDIOLOGY, Disp: 270 tablet, Rfl: 3  ?  gabapentin (NEURONTIN) 300 mg capsule, Take one capsule by mouth every 6 hours., Disp:  270 capsule, Rfl: 3  ?  glucosamine su 2KCl-chondroit 500-400 mg tab, Take 1 Tab by mouth twice daily., Disp: , Rfl:   ?  ipratropium bromide (ATROVENT) 42 mcg (0.06 %) nasal spray, Apply 2 sprays to each nostril as directed twice daily as needed., Disp: , Rfl:   ?  metoprolol XL (TOPROL XL) 100 mg extended release tablet, Take one tablet by mouth daily., Disp: 90 tablet, Rfl: 3  ?  mucus clearing device (AEROBIKA OSCILLATING PEP SYSTM MISC), Use  as directed. Use with salt water and inhale twice daily, Disp: , Rfl:   ?  MYRBETRIQ 50 mg tablet, Take 1 tablet by mouth daily. Does not take on days he takes Ditropan, Disp: , Rfl:   ?  OMEGA-3 FATTY ACIDS-FISH OIL PO, Take 1 capsule by mouth twice daily., Disp: , Rfl:   ?  other medication, mupirocin ointment and budesonide solution mixed with warm water Use as nasal irrigation twice daily, Disp: , Rfl:   ?  oxybutynin chloride (DITROPAN) 5 mg tablet, Take 5 mg by mouth twice daily. Does not take on days he takes Myrbetriq, Disp: , Rfl:   ?  pantoprazole DR (PROTONIX) 40 mg tablet, Take 40 mg by mouth twice daily., Disp: , Rfl:   ?  polyethylene glycol 3350 (GLYCOLAX; MIRALAX) 17 gram/dose powder, Take 17 g by mouth daily. (Patient taking differently: Take 17 g by mouth nightly as needed.), Disp: 595 g, Rfl: 3  ?  potassium chloride SR (K-DUR) 20 mEq tablet, Take one tab on Sunday, Tuesday, Thursday, and Saturday.  Take two tabs on Mon, Wed and Friday., Disp: 140 tablet, Rfl: 3  ?  rosuvastatin (CRESTOR) 40 mg tablet, Take one tablet by mouth at bedtime daily., Disp: 90 tablet, Rfl: 3  ?  sacubitriL-valsartan (ENTRESTO) 49-51 mg tablet, Take one tablet by mouth twice daily., Disp: 180 tablet, Rfl: 11  ?  sildenafil(+) (VIAGRA) 100 mg tablet, Take 1 Tab by mouth as Needed for Erectile dysfunction., Disp: 6 Tab, Rfl: 12  ?  spironolactone (ALDACTONE) 25 mg tablet, Take one tablet by mouth daily. Take with food., Disp: 90 tablet, Rfl: 3  ?  tamsulosin (FLOMAX) 0.4 mg capsule, Take 0.4 mg by mouth daily. Do not crush, chew or open capsules. Take 30 minutes following the same meal each day., Disp: , Rfl:   ?  testosterone cypionate 200 mg/mL kit, Inject 1 mL into the muscle every 14 days., Disp: , Rfl:   ?  tiZANidine (ZANAFLEX) 2 mg tablet, Take three tablets by mouth at bedtime as needed. 1 to 3 tabs qhs prn, Disp: 60 tablet, Rfl: 3  ?  traMADoL (ULTRAM) 50 mg tablet, every 4-6 hours, Disp: , Rfl:   ?  traZODone (DESYREL) 100 mg tablet, bedtime, Disp: , Rfl:   ?  Vacuum Erection Device System kit, Use as directed for sexual activity., Disp: 1 kit, Rfl: 11  ?  XARELTO 20 mg tablet, TAKE 1 TABLET BY MOUTH DAILY - MUST BE WITH EVENING MEAL, Disp: , Rfl:     Current Facility-Administered Medications:   ?  bupivacaine PF (MARCAINE) 0.5 % injection 3 mL, 3 mL, Injection, ONCE, Johnny Arneson, MD  ?  fentaNYL citrate PF (SUBLIMAZE) injection 50-100 mcg, 50-100 mcg, Intravenous, Q2 MIN PRN, Bobetta Korf, MD, 75 mcg at 12/24/20 1605  ?  lidocaine PF 1% (10 mg/mL) injection 0.2 mL, 0.2 mL, Injection, PRN, Duanne Duchesne, MD  ?  lidocaine PF 1% (10 mg/mL)  injection 2 mL, 2 mL, Injection, ONCE, Meliana Canner, MD  ?  midazolam (VERSED) injection 1-2 mg, 1-2 mg, Intravenous, Q2 MIN PRN, Evelina Bucy, MD, 2 mg at 12/24/20 1605    Physical examination:   There were no vitals taken for this visit.       General: The patient is a well-developed, well nourished 75 y.o. male in no acute distress.   HEENT: Head is normocephalic and atraumatic. EOMI bilaterally.   Cardiac: Based on palpation, pulse appears to be regular rate and rhythm.   Pulmonary: The patient has unlabored respirations and bilateral symmetric chest excursion. Abdomen: Soft, nontender, and nondistended. There is no rebound or guarding.   Extremities: No clubbing, cyanosis, or edema.     Neurologic:   The patient is alert and oriented times 3.   Cranial nerves II through XII are intact without any focal deficits.     Musculoskeletal:   There is mild TTP along the anterior aspect of hte right knee. Well healed surgical scar.     L-Spine   There is mild low lumbar paraspinal tenderness. Paraspinal muscle tone is increased.  Facet loading is positive.  ROM with flexion, extension, rotation, and lateral bending is intact.  Strength is equal and adequate bilaterally in the flexors and extensors of the bilateral lower extremities.   SLR is negative bilaterally.  + Gaenselen, lateral compression, and Fortin finger test bilaterally R > L      Results for orders placed during the hospital encounter of 08/24/20    MRI L-SPINE WO/W CONTRAST    Addendum 08/25/2020  5:39 PM  Finalized by Ivory Broad, M.D. on 08/24/2020 5:00 PM. Dictated by Delfin Gant, M.D. on 08/24/2020 3:54 PM.Addendum:  Delfin Gant discussed these findings via telephone with Dr. Francee Nodal nurse, Vassie Moment, at 8:34 AM on 08/25/2020.      Approved by Delfin Gant, M.D. on 08/25/2020 8:35 AM    By my electronic signature, I attest that I have personally reviewed the images for this examination and formulated the interpretations and opinions expressed in this report      Finalized by Ivory Broad, M.D. on 08/25/2020 5:36 PM. Dictated by Delfin Gant, M.D. on 08/25/2020 8:33 AM.    Narrative  EXAM: MRI L-SPINE    HISTORY:    , lumbar radiculapathy,    Technique: Multiple sagittal and axial MR sequences were obtained of the lumbar spine with and without MultiHance contrast.    Comparison: CT lumbar spine August 27, 2014    FINDINGS:    There are 5 nonrib-bearing lumbar type vertebral bodies. There is left convexity lumbar spinal curvature. Trace retrolisthesis at L2-L3 and trace anterolisthesis of L5-S1. X-Stop devices are seen at the spinous processes of the lumbar spine. Vertebral body heights are maintained. Multilevel vertebral endplate marrow changes. No suspicious bone marrow replacing lesion. The conus is normal in appearance and position at the T12-L1 level. abnormal enhancing lesion is identified. Bilateral renal atrophy. Small upper pole right renal cyst. There is an additional 1.3 cm upper pole right renal lesion which demonstrates T2 and T1 hypointensity (series 5, image 8).    T12-L1: No significant central spinal or neuroforaminal stenosis.    L1-L2: Marked disc degeneration, mild circumferential disc bulge, and facet hypertrophy. Mild central spinal stenosis. Moderate left and marked right neural foraminal stenosis.    L2-L3: Trace retrolisthesis, moderate disc degeneration, circumferential disc bulge, facet hypertrophy, and mild malignant flavum thickening. Mild central spinal stenosis. Moderate left and marked right  neuroforaminal stenosis.    L3-L4: Moderate disc degeneration, circumferential disc bulge, and facet hypertrophy. Moderate to marked spinal stenosis. Marked bilateral neuroforaminal stenosis.    L4-L5: Mild disc degeneration, circumferential disc bulge with superimposed central disc protrusion, ligamentum flavum thickening, and facet hypertrophy. Moderate to marked central spinal stenosis. Marked bilateral neural foraminal stenosis.    L5-S1: Mild disc degeneration, circumferential disc bulge, and facet hypertrophy. Moderate central spinal stenosis. Marked bilateral neural foraminal stenosis.    Impression  1.  Multilevel degenerative central spinal stenosis, greatest of moderate to marked degree at L3-L4, L4-L5, and L5-S1.  2.  Multilevel degenerative neuroforaminal stenosis, greatest of marked degree on the right at L1-L2 and L2-L3 as well as bilaterally at L3-L4, L4-5, and L5-S1.  3.  Left convexity lumbar spinal curvature.  4.  Trace retrolisthesis of L2-L3 and trace anterolisthesis of L5-S1.  5.  T2 and T1 hypointense peripheral right renal lesion. MRI abdomen (renal mass protocol) is recommended for further evaluation.    By my electronic signature, I attest that I have personally reviewed the images for this examination and formulated the interpretations and opinions expressed in this report          Last Cr and LFT's:  Creatinine   Date Value Ref Range Status   12/22/2020 1.26 (H) 0.4 - 1.24 MG/DL Final     AST (SGOT)   Date Value Ref Range Status   11/22/2018 25 7 - 40 U/L Final     ALT (SGPT)   Date Value Ref Range Status   11/22/2018 24 7 - 56 U/L Final     Alk Phosphatase   Date Value Ref Range Status   11/22/2018 51 25 - 110 U/L Final     Total Bilirubin   Date Value Ref Range Status   11/22/2018 0.8 0.3 - 1.2 MG/DL Final          Assessment:    Nikash Bense is a 75 y.o. 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 who presents for evaluation of pain.    The pain complaints are most likely due to:    1. Chronic pain of right knee  [place] AMB NERVE BLOCK    [place] AMB NERVE BLOCK    lidocaine PF 1% (10 mg/mL) injection 2 mL    bupivacaine PF (MARCAINE) 0.5 % injection 3 mL   2. Neuropathic pain  lidocaine PF 1% (10 mg/mL) injection 2 mL    bupivacaine PF (MARCAINE) 0.5 % injection 3 mL Patient has had an adequate trial of > 3 months of rest, exercise, multimodal treatment, and the passage of time without improvement of symptoms. The pain has significant impact on the daily quality of life.     Plan:    1. Plan for right knee genicular nerve block   2. Following this we will proceed with bilateral L5-S1 TFESI.   This patient's clinical history, exam, AND imaging support radiculopathy AND there is a significant impact on quality of life and function AND their  pain score has been documented in this note AND the pain has been present for at least 4 weeks AND they have failed to improve with noninvasive conservative care.   3. Continue tizanidine. No AEs noted.  4. We will increase his gabapentin to 1500 mg total daily. Will increase PM dose to 600 mg and keep the remaining doses the same.     Lesli Albee, MD  PGY-5 Interventional Pain Medicine Fellow  Chronic Pain Consult Pager [phone]

## 2021-02-25 NOTE — Procedures
Attending Surgeon: Evelina Bucy, MD    Anesthesia: Local    Pre-Procedure Diagnosis:   1. Chronic pain of right knee    2. Arthritis of right knee    3. Neuropathic pain        Post-Procedure Diagnosis:   1. Chronic pain of right knee    2. Arthritis of right knee    3. Neuropathic pain             Nerve Block (nerve block meds)  Nerve: Genicular, # of Genicular Nerves: 3 or more  Laterality: right   on 02/25/2021 3:02 PM    Consent:   Consent obtained: verbal  Consent given by: patient  Alternatives discussed: referral, no treatment, delayed treatment and alternative treatment  Discussed with patient the purpose of the treatment/procedure, other ways of treating my condition, including no treatment/ procedure and the risks and benefits of the alternatives. Patient has decided to proceed with treatment/procedure.        Universal Protocol:  Relevant documents: relevant documents present and verified  Site marked: the operative site was marked  Patient identity confirmed: Patient identify confirmed verbally with patient.        Time out: Immediately prior to procedure a time out was called to verify the correct patient, procedure, equipment, support staff and site/side marked as required        Procedures Details:   Indications: Neuritis and Pain Relief  Preparation: Patient was prepped and draped in the usual sterile fashion.  Prep: 2% chlorhexidine  Patient position: supine  Needle size: 25 G  Guidance: fluoroscopy  Medications administered: 2 mL bupivacaine PF 0.5 %  Outcome: Pain improved  Patient tolerance: tolerated well, no immediate complications  Comments:   PROCEDURE: right knee genicular nerve block. Procedure was performed with fluoroscopic needle guidance.    SURGEON: Evelina Bucy, MD.    MEDICATIONS: 0.5 percent bupivacaine     ANESTHESIA: Local anesthetic, 0.5 mL of 1 percent lidocaine at each site.     ESTIMATED BLOOD LOSS: None.    SPECIMENS REMOVED: None    COMPLICATIONS: None.    DESCRIPTION OF PROCEDURE: A time-out was taken to identify the correct patient, procedure, and site prior to starting the procedure. With the patient lying in the supine position, the area was prepped and draped in the usual sterile fashion using DuraPrep and a fenestrated drape. The opposite lower extremity was placed on pillows so that the lateral views could later be obtained without issue. The local anesthetic was given using a 27-gauge 1.5 inch needle by raising a skin wheal and going down to the hub at each of the 3 locations, which correlated with the superior lateral genicular nerve, superior medial genicular nerve, and the inferior medial geniculate nerve at the superior medial and superior lateral epicondyle of the femur as well as at the distal aspect of the medial tibial epicondyle.     A 25-gauge, 3.5-inch Quincke needle was then inserted at each of these locations and advanced under AP view until they were placed at the landmarks noted above. A lateral view was then obtained and position of the needles was confirmed to be at the mid level of the femur and tibia under fluoroscopy.     After negative aspiration, 0.5 mL of the solution above was injected at each site.     The procedure was completed without complication and was tolerated well. The patient was monitored after the procedure. The patient was given postprocedure discharge instructions  to follow at home. The patient was discharged in stable condition.     Evelina Bucy, MD, MBA           Administrations This Visit     bupivacaine PF (MARCAINE) 0.5 % injection 3 mL     Admin Date  02/25/2021 Action  Given Dose  3 mL Route  Injection Administered By  Beecher Mcardle, RN          lidocaine PF 1% (10 mg/mL) injection 2 mL     Admin Date  02/25/2021 Action  Given Dose  2 mL Route  Injection Administered By  Beecher Mcardle, RN              Estimated blood loss: none or minimal  Specimens: none  Patient tolerated the procedure well with no immediate complications. Pressure was applied, and hemostasis was accomplished.

## 2021-02-25 NOTE — Discharge Instructions - Supplementary Instructions
GENERAL POST PROCEDURE INSTRUCTIONS  Physician: _________________________________  Procedure Completed Today:  Joint Injection (hip, knee, shoulder)  Cervical Epidural Steroid Injection  Cervical Transforaminal Steroid Injection  Trigger Point Injection  Caudal Epidural Steroid Injection  Pudendal Nerve Block  Other _____________________ Thoracic Epidural Steroid Injection  Lumbar Epidural Steroid Injection  Lumbar Transforaminal Steroid Injection  Facet Joint Injection  Celiac Nerve Block  Sacrococcygeal  Sacroiliac Joint Injection   Important information following your procedure today:  You may drive today     If you had sedation, you may NOT drive today  Rest at home for the next 6 hours.  You may then begin to resume your normal activities.  DO NOT drive any vehicle, operate any power tools, drink alcohol, make any major decisions, or sign any legal documents for the next 12 hours.  Pain relief may not be immediate. It is possible you may even experience an increase in pain during the first 24-48 hours followed by a gradual decrease of your pain.  Though the procedure is generally safe, and complications are rare, we do ask that you be aware of any of the following:  Any swelling, persistent redness, new bleeding or drainage from the site of the injection.  You should not experience a severe headache.  You should not run a fever over 101oF.  New onset of sharp, severe back and or neck pain.  New onset of upper or lower extremity numbness or weakness.  New difficulty controlling bowel or bladder function after injection.  New shortness of breath.  ** If any of these occur, please call to report this occurrence to a nurse at [phone]. If you are calling after 4:00 p.m. or on weekends or holidays, please call [phone] and ask to have the resident physician on call for the physician paged or go to your local emergency room.  You may experience soreness at the injection site. Ice can be applied at 20-minute intervals for the first 24 hours. The following day you may alternate ice with heat if you are experiencing muscle tightness, otherwise continue with ice. Ice works best at decreasing pain. Avoid application of direct heat, hot showers or hot tubs today.  Avoid strenuous activity today. You many resume your regular activities and exercise tomorrow.  Patients with diabetes may see an elevation in blood sugars for 7-10 days after the injection. It is important to pay close attention to your diet, check your blood sugars daily and report extreme elevations to the physician that manages your diabetes.  Patients taking daily blood thinners can resume their regular dose this evening.  It is important that you take all medications ordered by your pain physician. Taking medications as ordered is an important part of your pain care plan. If you cannot continue the medication plan, please notify the physician.    Possible side effects to steroids that may occur:  Flushing or redness of the face  Irritability  Fluid retention  Change in women's menses  Minor headache    If you are unable to keep your upcoming appointment, please notify the [facility] scheduler at [phone] at least 24 hours in advance. If you have questions for the surgery center, call [facility] at [phone].

## 2021-03-01 ENCOUNTER — Encounter: Admit: 2021-03-01 | Discharge: 2021-03-01 | Payer: MEDICARE

## 2021-03-02 ENCOUNTER — Encounter: Admit: 2021-03-02 | Discharge: 2021-03-02 | Payer: MEDICARE

## 2021-03-02 NOTE — Telephone Encounter
Received notification that  [employer] has not been connected since 02/14/21. Patient was instructed to look at his/her transmitter to make sure that it is plugged into power and send a manual transmission to reconnect the transmitter. If he/she has any questions about how to send a transmission or if the transmitter does not appear to be working properly, they need to contact the device company directly. Patient was provided with that contact number. Requested the patient send [place] a MyChart message or contact our device nurses at [phone] to let us know after they have sent their transmission.  Placed call to preferred phone, got no answer LVM. MyChart sent. CDJ           Note: Patient needs to send a manual remote interrogation to reestablish communication to his/her remote transmitter.

## 2021-03-12 ENCOUNTER — Encounter: Admit: 2021-03-12 | Discharge: 2021-03-12 | Payer: MEDICARE

## 2021-03-16 ENCOUNTER — Encounter: Admit: 2021-03-16 | Discharge: 2021-03-16 | Payer: MEDICARE

## 2021-03-16 ENCOUNTER — Ambulatory Visit: Admit: 2021-03-16 | Discharge: 2021-03-16 | Payer: MEDICARE

## 2021-03-16 DIAGNOSIS — T148XXA Other injury of unspecified body region, initial encounter: Secondary | ICD-10-CM

## 2021-03-16 DIAGNOSIS — G4733 Obstructive sleep apnea (adult) (pediatric): Secondary | ICD-10-CM

## 2021-03-16 DIAGNOSIS — I340 Nonrheumatic mitral (valve) insufficiency: Secondary | ICD-10-CM

## 2021-03-16 DIAGNOSIS — N529 Male erectile dysfunction, unspecified: Secondary | ICD-10-CM

## 2021-03-16 DIAGNOSIS — I10 Essential (primary) hypertension: Secondary | ICD-10-CM

## 2021-03-16 DIAGNOSIS — J302 Other seasonal allergic rhinitis: Secondary | ICD-10-CM

## 2021-03-16 DIAGNOSIS — I2510 Atherosclerotic heart disease of native coronary artery without angina pectoris: Secondary | ICD-10-CM

## 2021-03-16 DIAGNOSIS — K449 Diaphragmatic hernia without obstruction or gangrene: Secondary | ICD-10-CM

## 2021-03-16 DIAGNOSIS — E669 Obesity, unspecified: Secondary | ICD-10-CM

## 2021-03-16 DIAGNOSIS — G5600 Carpal tunnel syndrome, unspecified upper limb: Secondary | ICD-10-CM

## 2021-03-16 DIAGNOSIS — M25561 Pain in right knee: Secondary | ICD-10-CM

## 2021-03-16 DIAGNOSIS — Z95810 Presence of automatic (implantable) cardiac defibrillator: Secondary | ICD-10-CM

## 2021-03-16 DIAGNOSIS — J449 Chronic obstructive pulmonary disease, unspecified: Secondary | ICD-10-CM

## 2021-03-16 DIAGNOSIS — M1711 Unilateral primary osteoarthritis, right knee: Secondary | ICD-10-CM

## 2021-03-16 DIAGNOSIS — M549 Dorsalgia, unspecified: Secondary | ICD-10-CM

## 2021-03-16 DIAGNOSIS — I509 Heart failure, unspecified: Secondary | ICD-10-CM

## 2021-03-16 DIAGNOSIS — K219 Gastro-esophageal reflux disease without esophagitis: Secondary | ICD-10-CM

## 2021-03-16 DIAGNOSIS — E785 Hyperlipidemia, unspecified: Secondary | ICD-10-CM

## 2021-03-16 DIAGNOSIS — I255 Ischemic cardiomyopathy: Secondary | ICD-10-CM

## 2021-03-16 DIAGNOSIS — M1990 Unspecified osteoarthritis, unspecified site: Secondary | ICD-10-CM

## 2021-03-16 DIAGNOSIS — I219 Acute myocardial infarction, unspecified: Secondary | ICD-10-CM

## 2021-03-16 DIAGNOSIS — I4891 Unspecified atrial fibrillation: Secondary | ICD-10-CM

## 2021-03-16 DIAGNOSIS — R053 Chronic cough: Secondary | ICD-10-CM

## 2021-03-16 DIAGNOSIS — R7303 Prediabetes: Secondary | ICD-10-CM

## 2021-03-16 MED ORDER — LIDOCAINE (PF) 10 MG/ML (1 %) IJ SOLN
2 mL | Freq: Once | INTRAMUSCULAR | 0 refills | Status: CP
Start: 2021-03-16 — End: ?

## 2021-03-16 MED ORDER — BUPIVACAINE (PF) 0.5 % (5 MG/ML) IJ SOLN
3 mL | Freq: Once | INTRAMUSCULAR | 0 refills | Status: CP
Start: 2021-03-16 — End: ?

## 2021-03-16 NOTE — Procedures
Attending Surgeon: Evelina Bucy, MD    Anesthesia: Local    Pre-Procedure Diagnosis:   1. Arthritis of right knee        Post-Procedure Diagnosis:   1. Arthritis of right knee             Nerve Block (Procedure)  Nerve: Genicular, # of Genicular Nerves: 3 or more  Laterality: right   on 03/16/2021 1:33 PM    Consent:   Consent obtained: written  Consent given by: patient  Alternatives discussed: no treatment, delayed treatment and alternative treatment  Discussed with patient the purpose of the treatment/procedure, other ways of treating my condition, including no treatment/ procedure and the risks and benefits of the alternatives. Patient has decided to proceed with treatment/procedure.        Universal Protocol:  Relevant documents: relevant documents present and verified  Site marked: the operative site was marked  Patient identity confirmed: Patient identify confirmed verbally with patient.        Time out: Immediately prior to procedure a time out was called to verify the correct patient, procedure, equipment, support staff and site/side marked as required        Procedures Details:   Indications: Neuritis and Pain Relief  Preparation: Patient was prepped and draped in the usual sterile fashion.  Prep: 2% chlorhexidine  Patient position: supine  Needle size: 25 G  Guidance: fluoroscopy  Outcome: Pain relieved  Patient tolerance: tolerated well, no immediate complications  Comments:   PROCEDURE: right knee genicular nerve block. Procedure was performed with fluoroscopic needle guidance.    SURGEON: Evelina Bucy, MD.    MEDICATIONS: 0.5 percent bupivacaine     ANESTHESIA: Local anesthetic, 0.5 mL of 1 percent lidocaine at each site.     ESTIMATED BLOOD LOSS: None.    SPECIMENS REMOVED: None    COMPLICATIONS: None.    DESCRIPTION OF PROCEDURE: A time-out was taken to identify the correct patient, procedure, and site prior to starting the procedure. With the patient lying in the supine position, the area was prepped and draped in the usual sterile fashion using DuraPrep and a fenestrated drape. The opposite lower extremity was placed on pillows so that the lateral views could later be obtained without issue. The local anesthetic was given using a 27-gauge 1.5 inch needle by raising a skin wheal and going down to the hub at each of the 3 locations, which correlated with the superior lateral genicular nerve, superior medial genicular nerve, and the inferior medial geniculate nerve at the superior medial and superior lateral epicondyle of the femur as well as at the distal aspect of the medial tibial epicondyle.     A 25-gauge, 3.5-inch Quincke needle was then inserted at each of these locations and advanced under AP view until they were placed at the landmarks noted above. A lateral view was then obtained and position of the needles was confirmed to be at the mid level of the femur and tibia under fluoroscopy.     After negative aspiration, 0.7 mL of the solution above was injected at each site.     The procedure was completed without complication and was tolerated well. The patient was monitored after the procedure. The patient was given postprocedure discharge instructions to follow at home. The patient was discharged in stable condition.     Evelina Bucy, MD, MBA           Administrations This Visit     bupivacaine PF (MARCAINE) 0.5 % injection  3 mL     Admin Date  03/16/2021 Action  Given Dose  3 mL Route  Injection Administered By  Olin Pia, RN          lidocaine PF 1% (10 mg/mL) injection 2 mL     Admin Date  03/16/2021 Action  Given Dose  2 mL Route  Injection Administered By  Olin Pia, RN              Estimated blood loss: none or minimal  Specimens: none  Patient tolerated the procedure well with no immediate complications. Pressure was applied, and hemostasis was accomplished.

## 2021-03-16 NOTE — Progress Notes
I have examined the patient, and there are no significant changes in their condition, from the previous H&P performed on 02/22/21.     Right genicular nerve injection        [facility] - Interventional Pain  Follow Up Visit   Subjective     Chief Complaint:   No chief complaint on file.  Pain    HPI: Long Intriago is a 75 y.o. 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 who presents for evaluation.    The patient has 2 separate pain complaints.     Right knee pain  Has a history of TKA  Complicated by hardware issue  Revision performed  Persistent anterior knee pain  Described as aching stabbing  Worse with standing, walking   Improved with sitting down, rest   Voltaren gel helps   No new issues on imaging per patient report  This is the worst of his current pain complaints    Separately, there is pain in the low back.  Primarily in the center   Radiation across the back in a band like fashion  There is pain into the buttocks.   There is some radiation of the pain along the posterolateral aspect of the left leg   Typically goes to just the level of the knee   No significant imporvement for greater than 2 months with the RFA  However, upon fruther probing it seems that the pain is a bit different  Pain is a bit lower now than it previously was  Worse with sitting for a long time   Worse with internal rotation of the hip   R is worse than L but both are bothrsome              PRIOR MEDICATIONS:   Effective  Acetaminophen (little)    Ineffective  Gabapentin    Unable to tolerate  NSAID    Never  Lyrica  Ami/Nortriptyline  Cymbalta  Tizanidine      PRIOR INTERVENTIONS:  L-spine surgery with hardware L2-3 L3-4 and then later L4-5 and L5-1 next (2 surgeries, most recent in 2008)  Effective  Bilateral SIJ (OSH, good benefit)    Ineffective  ESI x3 (OSH)        Renne Musca Moccio denies any recent fevers, chills, infection, antibiotics, bowel or bladder incontinence, saddle anesthesia. +Xarelto for a. fib.      ROS: All 14 systems reviewed and found to be negative except as above and as follows. +fatigue, joint pains.    Past Medical History:  Medical History:   Diagnosis Date   ? Atrial fibrillation (HCC) 02/08/2009   ? Back pain    ? CAD (coronary artery disease) 02/08/2009   ? Carpal tunnel syndrome    ? Chronic cough    ? Congestive heart disease (HCC)    ? COPD (chronic obstructive pulmonary disease) (HCC)    ? Erectile dysfunction, vasculogenic    ? Fracture    ? GERD (gastroesophageal reflux disease)  controlled with elevated HOB, protonix and pepcid    ? Hiatal hernia    ? Hyperlipemia 02/11/2009   ? Hypertension 02/11/2009   ? 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 Mitral regurgitation    ? Myocardial infarction [facility]) 2006   ? Obesity, Class I, 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)    ? OSA on CPAP 02/11/2009   ? Osteoarthritis    ? Pre-diabetes    ? S/P ICD (internal cardiac defibrillator) procedure 02/11/2009   ? Seasonal allergies        Family History:  Family History   Problem Relation Age of Onset   ? Cancer Father    ? Hypertension Mother        Social History:  Lives in [place] [address] (1.25 hours away)    Social History Socioeconomic History   ? Marital status: Married   Tobacco Use   ? Smoking status: Never Smoker   ? Smokeless tobacco: Never Used   Vaping Use   ? Vaping Use: Never used   Substance and Sexual Activity   ? Alcohol use: No     Alcohol/week: 0.0 standard drinks     Comment: very rarely 4 drinks per year   ? Drug use: No       Allergies:  Allergies   Allergen Reactions   ? Clarithromycin RASH     Allergy recorded in SMS: Biaxin~Reactions: RASH~THRUSH   ? Ketoconazole SEE COMMENTS     Had bloodshot eyes after use, and sore.   ? Lisinopril COUGH       Medications:    Current Outpatient Medications:   ?  acetaminophen (TYLENOL) 325 mg tablet, Take two tablets by mouth every 4 hours as needed for Pain., Disp: 300 tablet, Rfl: 1  ?  albuterol (VENTOLIN HFA, PROAIR HFA) 90 mcg/actuation inhaler, Inhale 2 Puffs by mouth four times daily as needed for Wheezing., Disp: , Rfl:   ?  aspirin 81 mg chewable tablet, Take 1 Tab by mouth daily. WAIT to restart until finished taking lovenox injections. (Patient taking differently: Chew 81 mg by mouth at bedtime daily.), Disp: 90 Tab, Rfl: 3  ?  cetirizine (ZYRTEC) 10 mg tablet, Take 10 mg by mouth daily., Disp: , Rfl:   ?  cholecalciferol (Vitamin D3) (VITAMIN D-3) 1,000 units tablet, Take 1,000 Units by mouth twice daily.  , Disp: , Rfl:   ?  dextromethorphan/guaiFENesin (MUCINEX DM) 30/600 mg Tb12, Take 2 tablets by mouth daily as needed., Disp: , Rfl:   ?  docusate (COLACE) 100 mg capsule, Take 200 mg by mouth at bedtime daily. Pt is taking 550 mg daily, Disp: , Rfl:   ?  ezetimibe (ZETIA) 10 mg tablet, Take one tablet by mouth at bedtime daily., Disp: 90 tablet, Rfl: 3  ?  famotidine (PEPCID) 40 mg tablet, Take 40 mg by mouth at bedtime daily., Disp: , Rfl:   ?  fexofenadine(+) (ALLEGRA) 180 mg tablet, Take 180 mg by mouth daily., Disp: , Rfl:   ?  fluticasone-umeclidin-vilanter (TRELEGY ELLIPTA) 100-62.5-25 mcg inhaler, Inhale 1 Dose by mouth into the lungs at bedtime daily., Disp: , Rfl:   ?  furosemide (LASIX) 40 mg tablet, TAKE 2 TABLETS ONCE DAILY, TAKE 3 TABLETS ON Monday/WEDNESDAY/FRIDAY AS DIRECTED BY       CARDIOLOGY, Disp: 270 tablet, Rfl: 3  ?  gabapentin (NEURONTIN) 300 mg capsule, Take one capsule by mouth every 6 hours., Disp:  270 capsule, Rfl: 3  ?  glucosamine su 2KCl-chondroit 500-400 mg tab, Take 1 Tab by mouth twice daily., Disp: , Rfl:   ?  ipratropium bromide (ATROVENT) 42 mcg (0.06 %) nasal spray, Apply 2 sprays to each nostril as directed twice daily as needed., Disp: , Rfl:   ?  metoprolol XL (TOPROL XL) 100 mg extended release tablet, Take one tablet by mouth daily., Disp: 90 tablet, Rfl: 3  ?  mucus clearing device (AEROBIKA OSCILLATING PEP SYSTM MISC), Use  as directed. Use with salt water and inhale twice daily, Disp: , Rfl:   ?  MYRBETRIQ 50 mg tablet, Take 1 tablet by mouth daily. Does not take on days he takes Ditropan, Disp: , Rfl:   ?  OMEGA-3 FATTY ACIDS-FISH OIL PO, Take 1 capsule by mouth twice daily., Disp: , Rfl:   ?  other medication, mupirocin ointment and budesonide solution mixed with warm water Use as nasal irrigation twice daily, Disp: , Rfl:   ?  oxybutynin chloride (DITROPAN) 5 mg tablet, Take 5 mg by mouth twice daily. Does not take on days he takes Myrbetriq, Disp: , Rfl:   ?  pantoprazole DR (PROTONIX) 40 mg tablet, Take 40 mg by mouth twice daily., Disp: , Rfl:   ?  polyethylene glycol 3350 (GLYCOLAX; MIRALAX) 17 gram/dose powder, Take 17 g by mouth daily. (Patient taking differently: Take 17 g by mouth nightly as needed.), Disp: 595 g, Rfl: 3  ?  potassium chloride SR (K-DUR) 20 mEq tablet, Take one tab on Sunday, Tuesday, Thursday, and Saturday.  Take two tabs on Mon, Wed and Friday., Disp: 140 tablet, Rfl: 3  ?  rosuvastatin (CRESTOR) 40 mg tablet, Take one tablet by mouth at bedtime daily., Disp: 90 tablet, Rfl: 3  ?  sacubitriL-valsartan (ENTRESTO) 49-51 mg tablet, Take one tablet by mouth twice daily., Disp: 180 tablet, Rfl: 11  ?  sildenafil(+) (VIAGRA) 100 mg tablet, Take 1 Tab by mouth as Needed for Erectile dysfunction., Disp: 6 Tab, Rfl: 12  ?  spironolactone (ALDACTONE) 25 mg tablet, Take one tablet by mouth daily. Take with food., Disp: 90 tablet, Rfl: 3  ?  tamsulosin (FLOMAX) 0.4 mg capsule, Take 0.4 mg by mouth daily. Do not crush, chew or open capsules. Take 30 minutes following the same meal each day., Disp: , Rfl:   ?  testosterone cypionate 200 mg/mL kit, Inject 1 mL into the muscle every 14 days., Disp: , Rfl:   ?  tiZANidine (ZANAFLEX) 2 mg tablet, Take three tablets by mouth at bedtime as needed. 1 to 3 tabs qhs prn, Disp: 60 tablet, Rfl: 3  ?  traMADoL (ULTRAM) 50 mg tablet, every 4-6 hours, Disp: , Rfl:   ?  traZODone (DESYREL) 100 mg tablet, bedtime, Disp: , Rfl:   ?  Vacuum Erection Device System kit, Use as directed for sexual activity., Disp: 1 kit, Rfl: 11  ?  XARELTO 20 mg tablet, TAKE 1 TABLET BY MOUTH DAILY - MUST BE WITH EVENING MEAL, Disp: , Rfl:     Current Facility-Administered Medications:   ?  bupivacaine PF (MARCAINE) 0.5 % injection 3 mL, 3 mL, Injection, ONCE, Lei Dower, MD  ?  fentaNYL citrate PF (SUBLIMAZE) injection 50-100 mcg, 50-100 mcg, Intravenous, Q2 MIN PRN, Makeisha Jentsch, MD, 75 mcg at 12/24/20 1605  ?  lidocaine PF 1% (10 mg/mL) injection 0.2 mL, 0.2 mL, Injection, PRN, Mason Dibiasio, MD  ?  lidocaine PF 1% (10 mg/mL)  injection 2 mL, 2 mL, Injection, ONCE, Meliana Canner, MD  ?  midazolam (VERSED) injection 1-2 mg, 1-2 mg, Intravenous, Q2 MIN PRN, Evelina Bucy, MD, 2 mg at 12/24/20 1605    Physical examination:   There were no vitals taken for this visit.       General: The patient is a well-developed, well nourished 75 y.o. male in no acute distress.   HEENT: Head is normocephalic and atraumatic. EOMI bilaterally.   Cardiac: Based on palpation, pulse appears to be regular rate and rhythm.   Pulmonary: The patient has unlabored respirations and bilateral symmetric chest excursion. Abdomen: Soft, nontender, and nondistended. There is no rebound or guarding.   Extremities: No clubbing, cyanosis, or edema.     Neurologic:   The patient is alert and oriented times 3.   Cranial nerves II through XII are intact without any focal deficits.     Musculoskeletal:   There is mild TTP along the anterior aspect of hte right knee. Well healed surgical scar.     L-Spine   There is mild low lumbar paraspinal tenderness. Paraspinal muscle tone is increased.  Facet loading is positive.  ROM with flexion, extension, rotation, and lateral bending is intact.  Strength is equal and adequate bilaterally in the flexors and extensors of the bilateral lower extremities.   SLR is negative bilaterally.  + Gaenselen, lateral compression, and Fortin finger test bilaterally R > L      Results for orders placed during the hospital encounter of 08/24/20    MRI L-SPINE WO/W CONTRAST    Addendum 08/25/2020  5:39 PM  Finalized by Ivory Broad, M.D. on 08/24/2020 5:00 PM. Dictated by Delfin Gant, M.D. on 08/24/2020 3:54 PM.Addendum:  Delfin Gant discussed these findings via telephone with Dr. Francee Nodal nurse, Vassie Moment, at 8:34 AM on 08/25/2020.      Approved by Delfin Gant, M.D. on 08/25/2020 8:35 AM    By my electronic signature, I attest that I have personally reviewed the images for this examination and formulated the interpretations and opinions expressed in this report      Finalized by Ivory Broad, M.D. on 08/25/2020 5:36 PM. Dictated by Delfin Gant, M.D. on 08/25/2020 8:33 AM.    Narrative  EXAM: MRI L-SPINE    HISTORY:    , lumbar radiculapathy,    Technique: Multiple sagittal and axial MR sequences were obtained of the lumbar spine with and without MultiHance contrast.    Comparison: CT lumbar spine August 27, 2014    FINDINGS:    There are 5 nonrib-bearing lumbar type vertebral bodies. There is left convexity lumbar spinal curvature. Trace retrolisthesis at L2-L3 and trace anterolisthesis of L5-S1. X-Stop devices are seen at the spinous processes of the lumbar spine. Vertebral body heights are maintained. Multilevel vertebral endplate marrow changes. No suspicious bone marrow replacing lesion. The conus is normal in appearance and position at the T12-L1 level. abnormal enhancing lesion is identified. Bilateral renal atrophy. Small upper pole right renal cyst. There is an additional 1.3 cm upper pole right renal lesion which demonstrates T2 and T1 hypointensity (series 5, image 8).    T12-L1: No significant central spinal or neuroforaminal stenosis.    L1-L2: Marked disc degeneration, mild circumferential disc bulge, and facet hypertrophy. Mild central spinal stenosis. Moderate left and marked right neural foraminal stenosis.    L2-L3: Trace retrolisthesis, moderate disc degeneration, circumferential disc bulge, facet hypertrophy, and mild malignant flavum thickening. Mild central spinal stenosis. Moderate left and marked right  neuroforaminal stenosis.    L3-L4: Moderate disc degeneration, circumferential disc bulge, and facet hypertrophy. Moderate to marked spinal stenosis. Marked bilateral neuroforaminal stenosis.    L4-L5: Mild disc degeneration, circumferential disc bulge with superimposed central disc protrusion, ligamentum flavum thickening, and facet hypertrophy. Moderate to marked central spinal stenosis. Marked bilateral neural foraminal stenosis.    L5-S1: Mild disc degeneration, circumferential disc bulge, and facet hypertrophy. Moderate central spinal stenosis. Marked bilateral neural foraminal stenosis.    Impression  1.  Multilevel degenerative central spinal stenosis, greatest of moderate to marked degree at L3-L4, L4-L5, and L5-S1.  2.  Multilevel degenerative neuroforaminal stenosis, greatest of marked degree on the right at L1-L2 and L2-L3 as well as bilaterally at L3-L4, L4-5, and L5-S1.  3.  Left convexity lumbar spinal curvature.  4.  Trace retrolisthesis of L2-L3 and trace anterolisthesis of L5-S1.  5.  T2 and T1 hypointense peripheral right renal lesion. MRI abdomen (renal mass protocol) is recommended for further evaluation.    By my electronic signature, I attest that I have personally reviewed the images for this examination and formulated the interpretations and opinions expressed in this report          Last Cr and LFT's:  Creatinine   Date Value Ref Range Status   12/22/2020 1.26 (H) 0.4 - 1.24 MG/DL Final     AST (SGOT)   Date Value Ref Range Status   11/22/2018 25 7 - 40 U/L Final     ALT (SGPT)   Date Value Ref Range Status   11/22/2018 24 7 - 56 U/L Final     Alk Phosphatase   Date Value Ref Range Status   11/22/2018 51 25 - 110 U/L Final     Total Bilirubin   Date Value Ref Range Status   11/22/2018 0.8 0.3 - 1.2 MG/DL Final          Assessment:    Lydell Grimstead is a 75 y.o. 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 who presents for evaluation of pain.    The pain complaints are most likely due to:    1. Arthritis of right knee  [place] AMB NERVE BLOCK    [place] AMB NERVE BLOCK    lidocaine PF 1% (10 mg/mL) injection 2 mL    bupivacaine PF (MARCAINE) 0.5 % injection 3 mL       Patient has had an adequate trial of > 3 months of rest, exercise, multimodal treatment, and the passage of time without improvement of symptoms. The pain has significant impact on the daily quality of life.     Plan:    1. Plan for right knee genicular nerve block   2. Following this we will proceed with bilateral L5-S1 TFESI.   This patient's clinical history, exam, AND imaging support radiculopathy AND there is a significant impact on quality of life and function AND their pain score has been documented in this note AND the pain has been present for at least 4 weeks AND  they have failed to improve with noninvasive conservative care.   3. Continue tizanidine. No AEs noted.  4. We will increase his gabapentin to 1500 mg total daily. Will increase PM dose to 600 mg and keep the remaining doses the same.     Lesli Albee, MD  PGY-5 Interventional Pain Medicine Fellow  Chronic Pain Consult Pager [phone]

## 2021-03-18 ENCOUNTER — Encounter: Admit: 2021-03-18 | Discharge: 2021-03-18 | Payer: MEDICARE

## 2021-03-18 DIAGNOSIS — M25561 Pain in right knee: Secondary | ICD-10-CM

## 2021-03-18 DIAGNOSIS — M1711 Unilateral primary osteoarthritis, right knee: Secondary | ICD-10-CM

## 2021-03-18 NOTE — Telephone Encounter
Pt calls after genicular block #2    Procedure ended at 1500. Pain started to return next day.     Right knee had 100% pain relief longer than 10 hours.     Discussed with Dr. Lourdes Sledge.     Plan for genicular RFA of the right knee.

## 2021-03-31 ENCOUNTER — Encounter: Admit: 2021-03-31 | Discharge: 2021-03-31 | Payer: MEDICARE

## 2021-03-31 ENCOUNTER — Ambulatory Visit: Admit: 2021-03-31 | Discharge: 2021-03-31 | Payer: MEDICARE

## 2021-03-31 DIAGNOSIS — K219 Gastro-esophageal reflux disease without esophagitis: Secondary | ICD-10-CM

## 2021-03-31 DIAGNOSIS — E669 Obesity, unspecified: Secondary | ICD-10-CM

## 2021-03-31 DIAGNOSIS — I472 Ventricular tachycardia: Secondary | ICD-10-CM

## 2021-03-31 DIAGNOSIS — Z95810 Presence of automatic (implantable) cardiac defibrillator: Secondary | ICD-10-CM

## 2021-03-31 DIAGNOSIS — I509 Heart failure, unspecified: Secondary | ICD-10-CM

## 2021-03-31 DIAGNOSIS — I340 Nonrheumatic mitral (valve) insufficiency: Secondary | ICD-10-CM

## 2021-03-31 DIAGNOSIS — G5600 Carpal tunnel syndrome, unspecified upper limb: Secondary | ICD-10-CM

## 2021-03-31 DIAGNOSIS — I4891 Unspecified atrial fibrillation: Secondary | ICD-10-CM

## 2021-03-31 DIAGNOSIS — I2510 Atherosclerotic heart disease of native coronary artery without angina pectoris: Secondary | ICD-10-CM

## 2021-03-31 DIAGNOSIS — I480 Paroxysmal atrial fibrillation: Secondary | ICD-10-CM

## 2021-03-31 DIAGNOSIS — R053 Chronic cough: Secondary | ICD-10-CM

## 2021-03-31 DIAGNOSIS — J302 Other seasonal allergic rhinitis: Secondary | ICD-10-CM

## 2021-03-31 DIAGNOSIS — E785 Hyperlipidemia, unspecified: Secondary | ICD-10-CM

## 2021-03-31 DIAGNOSIS — J449 Chronic obstructive pulmonary disease, unspecified: Secondary | ICD-10-CM

## 2021-03-31 DIAGNOSIS — R7303 Prediabetes: Secondary | ICD-10-CM

## 2021-03-31 DIAGNOSIS — I10 Essential (primary) hypertension: Secondary | ICD-10-CM

## 2021-03-31 DIAGNOSIS — G4733 Obstructive sleep apnea (adult) (pediatric): Secondary | ICD-10-CM

## 2021-03-31 DIAGNOSIS — N529 Male erectile dysfunction, unspecified: Secondary | ICD-10-CM

## 2021-03-31 DIAGNOSIS — K449 Diaphragmatic hernia without obstruction or gangrene: Secondary | ICD-10-CM

## 2021-03-31 DIAGNOSIS — I255 Ischemic cardiomyopathy: Secondary | ICD-10-CM

## 2021-03-31 DIAGNOSIS — M549 Dorsalgia, unspecified: Secondary | ICD-10-CM

## 2021-03-31 DIAGNOSIS — I519 Heart disease, unspecified: Secondary | ICD-10-CM

## 2021-03-31 DIAGNOSIS — M1990 Unspecified osteoarthritis, unspecified site: Secondary | ICD-10-CM

## 2021-03-31 DIAGNOSIS — T148XXA Other injury of unspecified body region, initial encounter: Secondary | ICD-10-CM

## 2021-03-31 DIAGNOSIS — I219 Acute myocardial infarction, unspecified: Secondary | ICD-10-CM

## 2021-03-31 DIAGNOSIS — G4730 Sleep apnea, unspecified: Secondary | ICD-10-CM

## 2021-03-31 LAB — BNP (B-TYPE NATRIURETIC PEPTI): BNP: 34 pg/mL (ref 0–100)

## 2021-03-31 LAB — BASIC METABOLIC PANEL
ANION GAP: 10 (ref 3–12)
BLD UREA NITROGEN: 33 mg/dL — ABNORMAL HIGH (ref 7–25)
CALCIUM: 8.7 mg/dL (ref 8.5–10.6)
CO2: 29 MMOL/L (ref 21–30)
CREATININE: 1.3 mg/dL — ABNORMAL HIGH (ref 0.4–1.24)
EGFR: 58 mL/min — ABNORMAL LOW (ref >60)
GLUCOSE,PANEL: 102 mg/dL — ABNORMAL HIGH (ref 70–100)
POTASSIUM: 4 MMOL/L (ref 3.5–5.1)
SODIUM: 139 MMOL/L (ref 137–147)

## 2021-03-31 MED ORDER — LOSARTAN 50 MG PO TAB
50 mg | ORAL_TABLET | Freq: Every day | ORAL | 3 refills | 30.00000 days | Status: AC
Start: 2021-03-31 — End: ?

## 2021-03-31 NOTE — Progress Notes
Date of Service: 03/31/2021    Johnny [name] is a 75 y.o. male.       HPI     Johnny [name] returns for follow-up of his coronary disease exercise intolerance hyper cholesterolemia prior bypass paroxysmal atrial fibrillation post ablation and hypercholesterolemia occurring in association with some pulmonary disease and sleep apnea that he has had evaluated previously at [facility] pulmonary hospital in [place].  He has had assessment for myocardial ischemia there as well.  He underwent 5 vessel bypass was in July 2006 with IMA to the LAD radial artery to the diagonal and sequential vein graft to 2 segments of the right and another vein graft to the OM.  He also had a mitral valvuloplasty.  He has had problems with dyspnea and has been in [facility] in [place] for evaluation.  In 2019 he had a perfusion study there that showed no ischemia or injury.  In 2021 when being evaluated for paroxysmal VT a Regadenoson Thallium thallium scan here showed EF 46% with fixed inferior wall defect and very limited mid basal anterolateral perfusion defect with reversibility. A 2015 SPECT study in our lab showed a similar but somewhat less prominent filling defect perfusion defect which reduced the impetus to proceed with angiography I had tried to start him on Entresto but an insurance coverage was too poor for him to shoulder the financial burden.      Today he reports that lumbosacral sacral and pelvic/sacroiliac pain progressed during the day and presented his most significant functional limitations.  He has exertional dyspnea which to me is difficult to dissect out from deconditioning.  His BMI at 33 puts him in the obese range but his weight has not been changing much recently.  He recognizes the need to amount of determined effort to reduce calorie intake in order to lose weight but to date he has not really engaged in the successful campaign.    When I saw him last I was hopeful that some of his dyspnea would improve with Entresto and I took him off losartan.  Unfortunately he is back on the losartan 20 because the cost of Entresto was prohibitive. His medication for LV dysfunction is fairly rigorou and has been titrated to his tolerance.  He is on aldosterone blockade with spironolactone 25.  He is on Toprol-XL100 mg daily for beta-blocker.      He is on furosemide 120mg  3 days a week 80 mg 4 days a week.  He feels dried out with this dose but it keeps his ankle swelling to a minimum and reduces his exertional shortness of breath.    He asked about Surgical procedure for OSA but I can't provide information on that. He mentioned that his dentist was doing some sleep studies through her office. He has a sleep study through [place] [facility] planned soon on at a [address] location    His wife are planning a trip to [place] for several weeks in September.  He is planning to take an oxygen concentrator with him as a backup.  He's going to [place] with a family group on Monday.          Vitals:    03/31/21 1612   PainSc: Zero   Height: 182.9 cm (6')     Body mass index is 33.19 kg/m?Marland Kitchen     Past Medical History  Patient Active Problem List    Diagnosis Date Noted   ? OSA (obstructive sleep apnea) 12/30/2020  CPAP titration (03/13/20);  6 CM H20 recommended  PLMI 4.4, PLMAI 0.6  Increased muscle tone noted during some epochs of REM     ? 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 Erectile dysfunction, vasculogenic    ? Beta blocker prescribed for left ventricular systolic dysfunction 07/14/2020     Patient on beta-blocker ARB and Aldo blocker because of EF down to 35% with referral for ICD.  EF has improved but patient needs to continue HFrEF oriented medical therapy     ? Visit for monitoring Tikosyn therapy 11/22/2018   ? Atrial fibrillation (HCC) 11/22/2018   ? VT (ventricular tachycardia) (HCC) 02/28/2018     02/19/2020 - Regadenoson MPI:  Small basal to mid inferolateral wall  fixed perfusion defec with corresponding hypokinesis on gated images suggestive of myocardial injury & small size, mild basal anterolateral wall  predominantly reversible perfusion defect  Corresponding regional wall motion is normal.  All myocardial segments appear viable.EF 46%.  High risk scintigraphic features are absent.      ? 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 Encounter for anticoagulation discussion and counseling 11/28/2014   ? Constipation due to opioid therapy 09/05/2014   ? Closed fracture of multiple ribs of left side 08/29/2014   ? 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 Left arm swelling 04/01/2014   ? Hyperlipemia 02/11/2009     HIghest total cholesterol he recalls pre treatment: 270  a. 7/06 Rx Vytorin 10/40, then to 10/80 approx 04/28/05. Recalls knee pain w/ lovastatin  b. 08/03/05 total 156 trig 118 HDL 39 LDL 95  C. 02/09/09 total 148 trig 114  HDL 43  LDL 81 Vytorin 10/80 Fishoil  >>Starts Lipitor 80 + Zetia March 2011     ? Sleep apnea 02/11/2009     Sleep apnea      a. 2/07 Using CPAP, good tolerance and benefit after Sleep study approx 2003 Dr. Andreas Newport     ? Hypertension 02/11/2009     Hypertension        a. ACE cough on lisinopril, olmesartan 20 well tolerated      ? Cerebrovascular disease 02/11/2009     Cerebrovascular disease       a. 7/06 Carotid US mod disease < 50% sten     ? 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  04/14/05 #28 [place] ring, Resect P2 Seg Post leaflet,ring.as above w/ CABG-[place]     ? 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 04/14/05: CABG x 5 LIMA-LAD, L Rad-Dx, sSVG-RCA-RCA, sSVG-OM & MV Plasty for MR  with initial trigger for evaluation being murmur and dyspnea  b. 12/13/05 EF 35% by aden thall, Mixed defect in Cx distribution. Referred for ICD   c. Rash w/ Cleda Daub, switch to Inspra  d. 6/07 Removal of all 8 sternal wires and parasternal lipoma d/t persisting pain  E. Jan, 2011 Regaden Simonne Maffucci. Limited inferior Defect, unchanged from 2007. EF 48%  F. 10/08/13: Reg/Thall Stress: EF 48% No ischemia or change from prior studies.    January 15, 2018  Lexiscan sestamibi study;  sinus rhythm, no arrhythmias or EKG changes    EF was 57% with normal wall motion and normal perfusion. [facility]      ? Atrial Fib Paroxysmal  (HCC) 02/08/2009     a. 7/06 Early post CAB Afib, Brief amio pre dismissal. Post DC AF still rapid, admit [place]  b. 05/23/05 TEE: No LA clot,  EF 25% Cvert to SR on Ticosyn 500 BID, dec'd to 250 BID w/             QTC@ , QTc to 470. Home 05/26/05  c. 11/29/05 Ticosyn DC'd, Dr. Hale Bogus p recurrent afib on ticosyn  D. 12/14 NSR on clinic exam, MAC OV (CBP)  E. 05/16/16 ICD interrogation: 6.2% atrial fibrillation burden, on warfarin   F 11/22/2018   Paroxysmal atrial fibrillation RF ablation  Dr Bradly Bienenstock         ? 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 Gretchen Short  12/17/13 Generator at [facility],  Fidelis RV-ICD lead extraction w/ laser assistance,Implant Medtronic Evera XT ICD and new RV lead. RA lead left i tact. DFTs done. Dr. Naoma Diener           Review of Systems   Constitutional: Negative.   HENT: Negative.    Eyes: Negative.    Cardiovascular: Positive for dyspnea on exertion.   Respiratory: Negative.    Endocrine: Negative.    Hematologic/Lymphatic: Negative.    Skin: Negative.    Musculoskeletal: Negative.    Gastrointestinal: Negative.    Genitourinary: Negative.    Neurological: Negative.    Psychiatric/Behavioral: Negative.    Allergic/Immunologic: Negative.    All other systems reviewed and are negative.  14 organ system review noted. It is negative except as reported in current narrative or above in the ROS section. This is a patient centered review of systems that was stated by the patient in his terms prior to my personal problem oriented interview with the patient     Physical Exam  Appearance:Overweight, but appears otherwise healthy, no distress   Skin: Not pale or icteric, skin warm and dry Eyes: Pupils equal and round Thyroid: not enlarged   Carotids: Normal upstrokes, no bruits Neck Veins: CVP <8, No V wave he may have slight positive HJR.  I did not think this was present as a finding when I saw him last time   chest/thorax: Breathing comfortably. Lungs clear to percussion and auscultation. No rales, rhonchi or wheezing   Cardiac: Rhythm regular. S1 and S2 normal with fourth heart sound, no rub or third sound. Gr i/vi apical systolic murmur   Abdomen: soft, non-tender, no masses. Normal bowel sounds. Liver not enlarged. No abdominal bruit, aorta not palpable   Pulses: Femorals: Normal pulses, no bruits Pedals 1+PT pulses, No HJR.    Leg/ankle edema:minimal biilateral lower leg Neuro/Motor: Normal strength & gross neurologic function all extremities, normal speech & hearing   Neuro/Cognition: Good insight, clear historian, no depression    Cardiovascular Studies  Today's 12 lead EKG: sinus rhythm, rate 88  Diffuse T wave flattening Right bundle branch block     Problems Addressed Today  No diagnosis found.    Assessment and Plan     Mr. Mijangos seems fairly well compensated.  He generally has several complaints who is objective findings do not seem proportionate to the degree of symptoms he is having.  Today I thought his fluid status was perhaps a little bit elevated today based on HJR although his neck veins are difficult to assess.  He does not have edema.  He has some dyspnea that is difficult to sort out.  He is going to [place] in September and will be seen again at [facility].  If the [facility] team thinks that reasonable to do another nuclear scan it  would be fine with me as they have a baseline for comparison.  If there is any findings suggesting significant myocardial ischemia that could be treated with revascularization uncertain that he will bring the results back to me and get his treatment at [facility].  I really do not think his symptoms are indicative of any discrete new problem related to coronary disease or to any form of pulmonary disease.  I think he has multifactorial limitations related to sleep apnea chronic deconditioning coronary disease with some degree of LV dysfunction and perhaps some degree of reactive airway or intrinsic lung disease that warrants his further evaluation and management at [facility].    He has an ICD in place but it has not fired recently.    He is taking oxygen concentrator with him to [place] which is probably a good idea that his LV staying over 10,000 feet.    40 minutes were expended as the total time for this encounter.  The time was spent reviewing records,  interviewing patient, doing exam, developing diagnosis, creating treatment plan written in patient oriented  terminology  for the AVS, explaining it to the patient and entering further information in the EMR.  Overall I did not make many changes in his medical program the time expended was required to sort out his symptoms and review his prior findings in order to assure comprehensive evaluation of his current symptoms and limitations.    NB: The free text in this document was generated through Dragon(TM) software with editing and proofreading  done by the author of this document Dr. Mable Paris MD, [facility] principally at the point of care. Some errors may persist.  If there are questions about content in this document please contact Dr. Hale Bogus.    The written information I provided Johnny [name] at the conclusion of today's encounter is as  follows:    Patient Instructions   I think you should be okay going to [place].  If you got an oxygen concentrator that will give you the same benefit that other people get by just driving down couple of [id number] to a better oxygen level.  Most the time people with heart disease but no lung disease do fairly well at altitude although it varies.      [facility] is a very good center for trying to assess lung disease and heart disease.  They have seen you before.  They we will know more about any procedure for a sleep apnea and will also know about the benefits and hazards related to any specific type of sleep apnea test.    I think it would be good for you to return for a recheck in six months. I can see you sooner if needed.   Use MyChart to message me or if necessary call in if you have problems or questions  Marissa Nestle, MD        .          Current Medications (including today's revisions)  ? acetaminophen (TYLENOL) 325 mg tablet Take two tablets by mouth every 4 hours as needed for Pain.   ? albuterol (VENTOLIN HFA, PROAIR HFA) 90 mcg/actuation inhaler Inhale 2 Puffs by mouth four times daily as needed for Wheezing.   ? aspirin 81 mg chewable tablet Take 1 Tab by mouth daily. WAIT to restart until finished taking lovenox injections. (Patient taking differently: Chew 81 mg by mouth at bedtime daily.)   ? cetirizine (ZYRTEC) 10 mg tablet Take 10  mg by mouth daily.   ? cholecalciferol (Vitamin D3) (VITAMIN D-3) 1,000 units tablet Take 1,000 Units by mouth twice daily.     ? dextromethorphan/guaiFENesin (MUCINEX DM) 30/600 mg Tb12 Take 2 tablets by mouth daily as needed.   ? docusate (COLACE) 100 mg capsule Take 200 mg by mouth at bedtime daily. Pt is taking 550 mg daily   ? ezetimibe (ZETIA) 10 mg tablet Take one tablet by mouth at bedtime daily.   ? famotidine (PEPCID) 40 mg tablet Take 40 mg by mouth at bedtime daily.   ? fexofenadine(+) (ALLEGRA) 180 mg tablet Take 180 mg by mouth daily.   ? fluticasone-umeclidin-vilanter (TRELEGY ELLIPTA) 100-62.5-25 mcg inhaler Inhale 1 Dose by mouth into the lungs at bedtime daily.   ? furosemide (LASIX) 40 mg tablet TAKE 2 TABLETS ONCE DAILY, TAKE 3 TABLETS ON Monday/WEDNESDAY/FRIDAY AS DIRECTED BY       CARDIOLOGY   ? gabapentin (NEURONTIN) 300 mg capsule Take one capsule by mouth every 6 hours.   ? glucosamine su 2KCl-chondroit 500-400 mg tab Take 1 Tab by mouth twice daily.   ? ipratropium bromide (ATROVENT) 42 mcg (0.06 %) nasal spray Apply 2 sprays to each nostril as directed twice daily as needed.   ? metoprolol XL (TOPROL XL) 100 mg extended release tablet Take one tablet by mouth daily.   ? mucus clearing device (AEROBIKA OSCILLATING PEP SYSTM MISC) Use  as directed. Use with salt water and inhale twice daily   ? MYRBETRIQ 50 mg tablet Take 1 tablet by mouth daily. Does not take on days he takes Ditropan   ? OMEGA-3 FATTY ACIDS-FISH OIL PO Take 1 capsule by mouth twice daily.   ? other medication mupirocin ointment and budesonide solution mixed with warm water  Use as nasal irrigation twice daily   ? oxybutynin chloride (DITROPAN) 5 mg tablet Take 5 mg by mouth twice daily. Does not take on days he takes Myrbetriq   ? pantoprazole DR (PROTONIX) 40 mg tablet Take 40 mg by mouth twice daily.   ? polyethylene glycol 3350 (GLYCOLAX; MIRALAX) 17 gram/dose powder Take 17 g by mouth daily. (Patient taking differently: Take 17 g by mouth nightly as needed.)   ? potassium chloride SR (K-DUR) 20 mEq tablet Take one tab on Sunday, Tuesday, Thursday, and Saturday.  Take two tabs on Mon, Wed and Friday.   ? rosuvastatin (CRESTOR) 40 mg tablet Take one tablet by mouth at bedtime daily.   ? sacubitriL-valsartan (ENTRESTO) 49-51 mg tablet Take one tablet by mouth twice daily.   ? sildenafil(+) (VIAGRA) 100 mg tablet Take 1 Tab by mouth as Needed for Erectile dysfunction.   ? spironolactone (ALDACTONE) 25 mg tablet Take one tablet by mouth daily. Take with food.   ? tamsulosin (FLOMAX) 0.4 mg capsule Take 0.4 mg by mouth daily. Do not crush, chew or open capsules. Take 30 minutes following the same meal each day.   ? testosterone cypionate 200 mg/mL kit Inject 1 mL into the muscle every 14 days.   ? tiZANidine (ZANAFLEX) 2 mg tablet Take three tablets by mouth at bedtime as needed. 1 to 3 tabs qhs prn   ? traMADoL (ULTRAM) 50 mg tablet every 4-6 hours   ? traZODone (DESYREL) 100 mg tablet bedtime   ? Vacuum Erection Device System kit Use as directed for sexual activity.   ? XARELTO 20 mg tablet TAKE 1 TABLET BY MOUTH DAILY - MUST BE WITH EVENING MEAL

## 2021-03-31 NOTE — Patient Instructions
I think you should be okay going to [place].  If you got an oxygen concentrator that will give you the same benefit that other people get by just driving down couple of [id number] to a better oxygen level.  Most the time people with heart disease but no lung disease do fairly well at altitude although it varies.      [facility] is a very good center for trying to assess lung disease and heart disease.  They have seen you before.  They we will know more about any procedure for a sleep apnea and will also know about the benefits and hazards related to any specific type of sleep apnea test.    I think it would be good for you to return for a recheck in six months. I can see you sooner if needed.   Use MyChart to message me or if necessary call in if you have problems or questions  Marissa Nestle, MD

## 2021-04-14 NOTE — Progress Notes
SPINE CENTER  INTERVENTIONAL PAIN PROCEDURE HISTORY AND PHYSICAL    Chief Complaint: Pain    HISTORY OF PRESENT ILLNESS:  LBP with LE radicular pain    Medical History:   Diagnosis Date   ? Atrial fibrillation (HCC) 02/08/2009   ? Back pain    ? CAD (coronary artery disease) 02/08/2009   ? Carpal tunnel syndrome    ? Chronic cough    ? Congestive heart disease (HCC)    ? COPD (chronic obstructive pulmonary disease) (HCC)    ? Erectile dysfunction, vasculogenic    ? Fracture    ? GERD (gastroesophageal reflux disease)     controlled with elevated HOB, protonix and pepcid    ? Hiatal hernia    ? Hyperlipemia 02/11/2009   ? Hypertension 02/11/2009   ? 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 Mitral regurgitation    ? Myocardial infarction [facility]) 2006   ? Obesity, Class I, 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)    ? OSA on CPAP 02/11/2009   ? Osteoarthritis    ? Pre-diabetes    ? S/P ICD (internal cardiac defibrillator) procedure 02/11/2009   ? Seasonal allergies        Surgical History:   Procedure Laterality Date   ? HUMERUS FRACTURE SURGERY Left 2004   ? MITRAL VALVULOPLASTY  2006    same time as bypass   ? CORONARY ARTERY BYPASS GRAFT  04/14/05    CABGx5 LIMA-LAD, L Rad-Dx, sSVG-RCA-RCA, sSVG-OM & MV Plasty for MR:   ? RHYTHM DEVICE PLACEMENT  2007    defibrillator   ? FOOT SURGERY Bilateral 2012    Tarsal tunnel release w/ bunionectomy and hammer toe    ? EAR SURGERY Bilateral 2012    Ear Tubes   ? SINUS SURGERY  10/2011   ? CARDIOVASCULAR STRESS TEST  09/2013   ? KNEE REPLACEMENT Right 05/2014   ? 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    Performed by Simonne Maffucci, MD at [facility] OR   ? TRANSVENOUS REMOVAL IMPLANTABLE DEFIBRILLATOR RIGHT VENTRICULAR LEAD Left 03/13/2018    Performed by Kathreen Cornfield, MD at [facility] CVOR   ? INSERTION RIGHT VENTRICULAR LEAD Left 03/13/2018    Performed by Kathreen Cornfield, MD at [facility] CVOR   ? FLUOROSCOPY - CARDIAC  03/13/2018 Performed by Kathreen Cornfield, MD at [facility] CVOR   ? INSERTION/ REPLACEMENT TEMPORARY PACEMAKER LEAD/ CATHETER  03/13/2018    Performed by Kathreen Cornfield, MD at [facility] CVOR   ? REMOVAL AND REPLACEMENT IMPLANTABLE DEFIBRILLATOR GENERATOR - DUAL LEAD SYSTEM  03/13/2018    Performed by Kathreen Cornfield, MD at [facility] CVOR   ? CARDIOTHORACIC SURGERY STANDBY N/A 03/13/2018    Performed by Cath, Physician at [facility] CVOR   ? INTRACARDIAC CATHETER ABLATION WITH COMPREHENSIVE ELECTROPHYSIOLOGIC EVALUATION - ATRIAL FIBRILLATION, RADIOFREQUENCY N/A 11/22/2018    Performed by Kathreen Cornfield, MD at [facility] EP LAB   ? INTRACARDIAC ELECTROPHYSIOLOGIC 3-DIMENSIONAL MAPPING N/A 11/22/2018    Performed by Kathreen Cornfield, MD at [facility] EP LAB   ? INTRACARDIAC ECHOCARDIOGRAPHY N/A 11/22/2018    Performed by Kathreen Cornfield, MD at [facility] EP LAB   ? POSSIBLE INTRAVENOUS DRUG INFUSION FOR STIMULATION AND PACING N/A 11/22/2018    Performed by Kathreen Cornfield, MD at [facility] EP LAB   ? POSSIBLE INSERTION ESOPHAGEAL DEVIATOR N/A 11/22/2018    Performed by Kathreen Cornfield, MD  at [facility] EP LAB   ? POSSIBLE EXTERNAL CARDIOVERSION N/A 11/22/2018    Performed by Kathreen Cornfield, MD at [facility] EP LAB   ? INTRACARDIAC CATHETER ABLATION WITH COMPREHENSIVE ELECTROPHYSIOLOGIC EVALUATION - ATYPICAL FLUTTER Right 11/22/2018    Performed by Kathreen Cornfield, MD at [facility] EP LAB   ? TRANSESOPHAGEAL ECHOCARDIOGRAM DURING INTERVENTION N/A 11/22/2018    Performed by Cath, Physician at [facility] EP LAB   ? CARDIAC DEFIBRILLATOR PLACEMENT  11/2013, 02-2018    S/P Fidelis ICD Lead Extraction and New Lead Implantation   ? HX BACK SURGERY  2013, 2006    Lumbart Laminectomy and Fusion   ? HX CARPAL TUNNEL RELEASE Right 1990s   ? HX HEART CATHETERIZATION         family history includes Cancer in his father; Hypertension in his mother.    Social History     Socioeconomic History   ? Marital status: Married   Tobacco Use   ? Smoking status: Never Smoker   ? Smokeless tobacco: Never Used   Vaping Use   ? Vaping Use: Never used   Substance and Sexual Activity   ? Alcohol use: No     Alcohol/week: 0.0 standard drinks     Comment: very rarely 4 drinks per year   ? Drug use: No       Allergies   Allergen Reactions   ? Clarithromycin RASH     Allergy recorded in SMS: Biaxin~Reactions: RASH~THRUSH   ? Ketoconazole SEE COMMENTS     Had bloodshot eyes after use, and sore.   ? Lisinopril COUGH       There were no vitals filed for this visit.          REVIEW OF SYSTEMS: 10 point ROS obtained and negative      PHYSICAL EXAM:    General: Alert, cooperative, no acute distress.  HEENT: Normocephalic, atraumatic.  Neck: Supple.  Lungs: Unlabored respirations, bilateral and equal chest excursion.  Heart: Regular rate.  Skin: Warm and dry to touch.  Abdomen: Nondistended.  MSK: No deformity.  Neurological: Alert and oriented x3.        IMPRESSION:    1. Lumbosacral radiculopathy    2. DDD (degenerative disc disease), lumbosacral         PLAN: Lumbar Transforaminal Steroid Injection Bilateral L5-S1    This patient's clinical history, exam, AND imaging support radiculopathy AND there is a significant impact on quality of life and function AND their pain score has been documented in this note AND the pain has been present for at least 4 weeks AND they have failed to improve with noninvasive conservative care.

## 2021-04-15 ENCOUNTER — Encounter: Admit: 2021-04-15 | Discharge: 2021-04-15 | Payer: MEDICARE

## 2021-04-15 ENCOUNTER — Ambulatory Visit: Admit: 2021-04-15 | Discharge: 2021-04-15 | Payer: MEDICARE

## 2021-04-15 DIAGNOSIS — M1990 Unspecified osteoarthritis, unspecified site: Secondary | ICD-10-CM

## 2021-04-15 DIAGNOSIS — K219 Gastro-esophageal reflux disease without esophagitis: Secondary | ICD-10-CM

## 2021-04-15 DIAGNOSIS — N529 Male erectile dysfunction, unspecified: Secondary | ICD-10-CM

## 2021-04-15 DIAGNOSIS — I509 Heart failure, unspecified: Secondary | ICD-10-CM

## 2021-04-15 DIAGNOSIS — E669 Obesity, unspecified: Secondary | ICD-10-CM

## 2021-04-15 DIAGNOSIS — R7303 Prediabetes: Secondary | ICD-10-CM

## 2021-04-15 DIAGNOSIS — E785 Hyperlipidemia, unspecified: Secondary | ICD-10-CM

## 2021-04-15 DIAGNOSIS — G5600 Carpal tunnel syndrome, unspecified upper limb: Secondary | ICD-10-CM

## 2021-04-15 DIAGNOSIS — G4733 Obstructive sleep apnea (adult) (pediatric): Secondary | ICD-10-CM

## 2021-04-15 DIAGNOSIS — M549 Dorsalgia, unspecified: Secondary | ICD-10-CM

## 2021-04-15 DIAGNOSIS — I219 Acute myocardial infarction, unspecified: Secondary | ICD-10-CM

## 2021-04-15 DIAGNOSIS — J449 Chronic obstructive pulmonary disease, unspecified: Secondary | ICD-10-CM

## 2021-04-15 DIAGNOSIS — J302 Other seasonal allergic rhinitis: Secondary | ICD-10-CM

## 2021-04-15 DIAGNOSIS — I2510 Atherosclerotic heart disease of native coronary artery without angina pectoris: Secondary | ICD-10-CM

## 2021-04-15 DIAGNOSIS — I255 Ischemic cardiomyopathy: Secondary | ICD-10-CM

## 2021-04-15 DIAGNOSIS — R053 Chronic cough: Secondary | ICD-10-CM

## 2021-04-15 DIAGNOSIS — T148XXA Other injury of unspecified body region, initial encounter: Secondary | ICD-10-CM

## 2021-04-15 DIAGNOSIS — I10 Essential (primary) hypertension: Secondary | ICD-10-CM

## 2021-04-15 DIAGNOSIS — I340 Nonrheumatic mitral (valve) insufficiency: Secondary | ICD-10-CM

## 2021-04-15 DIAGNOSIS — I4891 Unspecified atrial fibrillation: Secondary | ICD-10-CM

## 2021-04-15 DIAGNOSIS — M5137 Other intervertebral disc degeneration, lumbosacral region: Secondary | ICD-10-CM

## 2021-04-15 DIAGNOSIS — K449 Diaphragmatic hernia without obstruction or gangrene: Secondary | ICD-10-CM

## 2021-04-15 DIAGNOSIS — Z95810 Presence of automatic (implantable) cardiac defibrillator: Secondary | ICD-10-CM

## 2021-04-15 DIAGNOSIS — M5417 Radiculopathy, lumbosacral region: Secondary | ICD-10-CM

## 2021-04-15 MED ORDER — IOHEXOL 300 MG IODINE/ML IV SOLN
2 mL | Freq: Once | 0 refills | Status: CP
Start: 2021-04-15 — End: ?

## 2021-04-15 MED ORDER — DEXAMETHASONE SODIUM PHOS (PF) 10 MG/ML IJ EPIDURAL SOLN
15 mg | Freq: Once | EPIDURAL | 0 refills | Status: CP
Start: 2021-04-15 — End: ?

## 2021-04-15 MED ORDER — LIDOCAINE (PF) 10 MG/ML (1 %) IJ SOLN
2 mL | Freq: Once | INTRAMUSCULAR | 0 refills | Status: CP
Start: 2021-04-15 — End: ?

## 2021-04-15 NOTE — Procedures
Attending Surgeon: Evelina Bucy, MD    Anesthesia: Local    Pre-Procedure Diagnosis:   1. Lumbosacral radiculopathy    2. DDD (degenerative disc disease), lumbosacral        Post-Procedure Diagnosis:   1. Lumbosacral radiculopathy    2. DDD (degenerative disc disease), lumbosacral             Selective Nerve Rootblock/Transforaminal Lumbar/Sacral  Procedure: transforaminal epidural    Laterality: bilateral   on 04/15/2021 1:52 PM  Location: lumbar -  L5-S1      Consent:   Consent obtained: written  Consent given by: patient  Risks discussed: allergic reaction, bleeding, bruising, infection, nerve damage, no change or worsening in pain, reaction to medication, seizure, swelling and weakness  Alternatives discussed: alternative treatment, delayed treatment and no treatment  Discussed with patient the purpose of the treatment/procedure, other ways of treating my condition, including no treatment/ procedure and the risks and benefits of the alternatives. Patient has decided to proceed with treatment/procedure.        Universal Protocol:  Relevant documents: relevant documents present and verified  Site marked: the operative site was marked  Patient identity confirmed: Patient identify confirmed verbally with patient.        Time out: Immediately prior to procedure a time out was called to verify the correct patient, procedure, equipment, support staff and site/side marked as required      Procedures Details:   Indications: pain   Prep: chlorhexidine  Patient position: prone  Estimated Blood Loss: minimal  Specimens: none  Number of Joints: 1  Guidance: fluoroscopy  Contrast: Procedure confirmed with contrast under live fluoroscopy.  Needle and Epidural Catheter: quincke  Needle size: 25 G  Injection procedure: Incremental injection and Negative aspiration for blood  Patient tolerance: Patient tolerated the procedure well with no immediate complications. Pressure was applied, and hemostasis was accomplished.  Outcome: Pain relieved  Comments:   LUMBAR/SACRAL TRANSFORAMINAL EPIDURAL STEROID INJECTION PROCEDURE:    1) bilateral L5-S1 transforaminal epidural steroid injection  2) Fluoroscopic needle guidance    REASON FOR PROCEDURE: Lumbar radiculopathy    PHYSICIAN: Evelina Bucy, MD    MEDICATIONS INJECTED: 0.75 mL of Dexamethasone (7.5 mg) + 0.75 ml lidocaine 1% at each level    LOCAL ANESTHETIC INJECTED: 1 mL of 1% lidocaine per site    SEDATION MEDICATIONS: None    ESTIMATED BLOOD LOSS: None    SPECIMENS REMOVED: None    COMPLICATIONS: None    TECHNIQUE: Time-out was taken to identify the correct patient, procedure and side prior to starting the procedure. Lying in a prone position, the patient was prepped and draped in the usual sterile fashion using DuraPrep and a fenestrated drape. The area to be injected was determined under fluoroscopic guidance. Local anesthetic was given by raising a skin wheal and going down to the hub of a 27-gauge 1.25-inch needle.     The 3.5-inch 25-gauge Quincke needle was advanced toward the 6 o?clock position of the pedicle at each above-named nerve root level. The needle was advanced to the final position via a lateral fluoroscopic intermittent image. Omnipaque 240 was injected under live fluoroscopy and showed epidural spread and there was no vascular runoff. After a negative aspiration, the medication was then injected.     The procedure was completed without complications and was tolerated well. The patient was monitored after the procedure. The patient (or responsible party) was given post-procedure and discharge instructions to follow at home. The patient was discharged  in stable condition.           Administrations This Visit     dexamethasone PF (DECARDON) epidural injection 15 mg     Admin Date  04/15/2021 Action  Given Dose  15 mg Route  Epidural Administered By  Rosine Door, RN          iohexol (OMNIPAQUE-300) 300 mg/mL injection 2 mL     Admin Date  04/15/2021 Action  Given Dose  2 mL Route  SEE ADMIN INSTRUCTIONS Administered By  Rosine Door, RN          lidocaine PF 1% (10 mg/mL) injection 2 mL     Admin Date  04/15/2021 Action  Given Dose  2 mL Route  Injection Administered By  Rosine Door, RN              Estimated blood loss: none or minimal  Specimens: none  Patient tolerated the procedure well with no immediate complications. Pressure was applied, and hemostasis was accomplished.

## 2021-04-15 NOTE — Discharge Instructions - Supplementary Instructions
GENERAL POST PROCEDURE INSTRUCTIONS  Physician: _________________________________  Procedure Completed Today:  Joint Injection (hip, knee, shoulder)  Cervical Epidural Steroid Injection  Cervical Transforaminal Steroid Injection  Trigger Point Injection  Caudal Epidural Steroid Injection  Pudendal Nerve Block  Other _____________________ Thoracic Epidural Steroid Injection  Lumbar Epidural Steroid Injection  Lumbar Transforaminal Steroid Injection  Facet Joint Injection  Celiac Nerve Block  Sacrococcygeal  Sacroiliac Joint Injection   Important information following your procedure today:  You may drive today     If you had sedation, you may NOT drive today  Rest at home for the next 6 hours.  You may then begin to resume your normal activities.  DO NOT drive any vehicle, operate any power tools, drink alcohol, make any major decisions, or sign any legal documents for the next 12 hours.  Pain relief may not be immediate. It is possible you may even experience an increase in pain during the first 24-48 hours followed by a gradual decrease of your pain.  Though the procedure is generally safe, and complications are rare, we do ask that you be aware of any of the following:  Any swelling, persistent redness, new bleeding or drainage from the site of the injection.  You should not experience a severe headache.  You should not run a fever over 101oF.  New onset of sharp, severe back and or neck pain.  New onset of upper or lower extremity numbness or weakness.  New difficulty controlling bowel or bladder function after injection.  New shortness of breath.  ** If any of these occur, please call to report this occurrence to a nurse at [phone]. If you are calling after 4:00 p.m. or on weekends or holidays, please call [phone] and ask to have the resident physician on call for the physician paged or go to your local emergency room.  You may experience soreness at the injection site. Ice can be applied at 20-minute intervals for the first 24 hours. The following day you may alternate ice with heat if you are experiencing muscle tightness, otherwise continue with ice. Ice works best at decreasing pain. Avoid application of direct heat, hot showers or hot tubs today.  Avoid strenuous activity today. You many resume your regular activities and exercise tomorrow.  Patients with diabetes may see an elevation in blood sugars for 7-10 days after the injection. It is important to pay close attention to your diet, check your blood sugars daily and report extreme elevations to the physician that manages your diabetes.  Patients taking daily blood thinners can resume their regular dose this evening.  It is important that you take all medications ordered by your pain physician. Taking medications as ordered is an important part of your pain care plan. If you cannot continue the medication plan, please notify the physician.    Possible side effects to steroids that may occur:  Flushing or redness of the face  Irritability  Fluid retention  Change in women's menses  Minor headache    If you are unable to keep your upcoming appointment, please notify the [facility] scheduler at [phone] at least 24 hours in advance. If you have questions for the surgery center, call [facility] at [phone].

## 2021-04-17 ENCOUNTER — Encounter: Admit: 2021-04-17 | Discharge: 2021-04-17 | Payer: MEDICARE

## 2021-04-19 ENCOUNTER — Encounter: Admit: 2021-04-19 | Discharge: 2021-04-19 | Payer: MEDICARE

## 2021-04-20 ENCOUNTER — Ambulatory Visit: Admit: 2021-04-20 | Discharge: 2021-04-20 | Payer: MEDICARE

## 2021-04-20 ENCOUNTER — Encounter: Admit: 2021-04-20 | Discharge: 2021-04-20 | Payer: MEDICARE

## 2021-04-20 DIAGNOSIS — I219 Acute myocardial infarction, unspecified: Secondary | ICD-10-CM

## 2021-04-20 DIAGNOSIS — I509 Heart failure, unspecified: Secondary | ICD-10-CM

## 2021-04-20 DIAGNOSIS — G5600 Carpal tunnel syndrome, unspecified upper limb: Secondary | ICD-10-CM

## 2021-04-20 DIAGNOSIS — I4891 Unspecified atrial fibrillation: Secondary | ICD-10-CM

## 2021-04-20 DIAGNOSIS — I340 Nonrheumatic mitral (valve) insufficiency: Secondary | ICD-10-CM

## 2021-04-20 DIAGNOSIS — R053 Chronic cough: Secondary | ICD-10-CM

## 2021-04-20 DIAGNOSIS — M1990 Unspecified osteoarthritis, unspecified site: Secondary | ICD-10-CM

## 2021-04-20 DIAGNOSIS — K219 Gastro-esophageal reflux disease without esophagitis: Secondary | ICD-10-CM

## 2021-04-20 DIAGNOSIS — I10 Essential (primary) hypertension: Secondary | ICD-10-CM

## 2021-04-20 DIAGNOSIS — E785 Hyperlipidemia, unspecified: Secondary | ICD-10-CM

## 2021-04-20 DIAGNOSIS — T148XXA Other injury of unspecified body region, initial encounter: Secondary | ICD-10-CM

## 2021-04-20 DIAGNOSIS — I2510 Atherosclerotic heart disease of native coronary artery without angina pectoris: Secondary | ICD-10-CM

## 2021-04-20 DIAGNOSIS — R7303 Prediabetes: Secondary | ICD-10-CM

## 2021-04-20 DIAGNOSIS — J449 Chronic obstructive pulmonary disease, unspecified: Secondary | ICD-10-CM

## 2021-04-20 DIAGNOSIS — N529 Male erectile dysfunction, unspecified: Secondary | ICD-10-CM

## 2021-04-20 DIAGNOSIS — Z95810 Presence of automatic (implantable) cardiac defibrillator: Secondary | ICD-10-CM

## 2021-04-20 DIAGNOSIS — K449 Diaphragmatic hernia without obstruction or gangrene: Secondary | ICD-10-CM

## 2021-04-20 DIAGNOSIS — I255 Ischemic cardiomyopathy: Secondary | ICD-10-CM

## 2021-04-20 DIAGNOSIS — J302 Other seasonal allergic rhinitis: Secondary | ICD-10-CM

## 2021-04-20 DIAGNOSIS — E669 Obesity, unspecified: Secondary | ICD-10-CM

## 2021-04-20 DIAGNOSIS — M1711 Unilateral primary osteoarthritis, right knee: Secondary | ICD-10-CM

## 2021-04-20 DIAGNOSIS — G4733 Obstructive sleep apnea (adult) (pediatric): Secondary | ICD-10-CM

## 2021-04-20 DIAGNOSIS — M549 Dorsalgia, unspecified: Secondary | ICD-10-CM

## 2021-04-20 DIAGNOSIS — M25561 Pain in right knee: Secondary | ICD-10-CM

## 2021-04-20 MED ORDER — DEXAMETHASONE SODIUM PHOS (PF) 10 MG/ML IJ EPIDURAL SOLN
10 mg | Freq: Once | EPIDURAL | 0 refills | Status: AC
Start: 2021-04-20 — End: ?

## 2021-04-20 MED ORDER — MIDAZOLAM 1 MG/ML IJ SOLN
1-2 mg | INTRAVENOUS | 0 refills | Status: AC | PRN
Start: 2021-04-20 — End: ?

## 2021-04-20 MED ORDER — LIDOCAINE (PF) 20 MG/ML (2 %) IJ SOLN
5 mL | Freq: Once | INTRAMUSCULAR | 0 refills | Status: AC
Start: 2021-04-20 — End: ?

## 2021-04-20 MED ORDER — LIDOCAINE (PF) 10 MG/ML (1 %) IJ SOLN
2 mL | Freq: Once | INTRAMUSCULAR | 0 refills | Status: CP
Start: 2021-04-20 — End: ?

## 2021-04-20 MED ORDER — FENTANYL CITRATE (PF) 50 MCG/ML IJ SOLN
50-100 ug | INTRAVENOUS | 0 refills | Status: AC | PRN
Start: 2021-04-20 — End: ?

## 2021-04-20 NOTE — Progress Notes
[facility]  INTERVENTIONAL PAIN PROCEDURE HISTORY AND PHYSICAL    Chief Complaint: Pain    HISTORY OF PRESENT ILLNESS:  Right knee pain. At least 80% relief x2 with genicular nerve blocks.     Medical History:   Diagnosis Date   ? Atrial fibrillation (HCC) 02/08/2009   ? Back pain    ? CAD (coronary artery disease) 02/08/2009   ? Carpal tunnel syndrome    ? Chronic cough    ? Congestive heart disease (HCC)    ? COPD (chronic obstructive pulmonary disease) (HCC)    ? Erectile dysfunction, vasculogenic    ? Fracture    ? GERD (gastroesophageal reflux disease)     controlled with elevated HOB, protonix and pepcid    ? Hiatal hernia    ? Hyperlipemia 02/11/2009   ? Hypertension 02/11/2009   ? 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 Mitral regurgitation    ? Myocardial infarction [facility]) 2006   ? Obesity, Class I, 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)    ? OSA on CPAP 02/11/2009   ? Osteoarthritis    ? Pre-diabetes    ? S/P ICD (internal cardiac defibrillator) procedure 02/11/2009   ? Seasonal allergies        Surgical History:   Procedure Laterality Date   ? HUMERUS FRACTURE SURGERY Left 2004   ? MITRAL VALVULOPLASTY  2006    same time as bypass   ? CORONARY ARTERY BYPASS GRAFT  04/14/05    CABGx5 LIMA-LAD, L Rad-Dx, sSVG-RCA-RCA, sSVG-OM & MV Plasty for MR:   ? RHYTHM DEVICE PLACEMENT  2007    defibrillator   ? FOOT SURGERY Bilateral 2012    Tarsal tunnel release w/ bunionectomy and hammer toe    ? EAR SURGERY Bilateral 2012    Ear Tubes   ? SINUS SURGERY  10/2011   ? CARDIOVASCULAR STRESS TEST  09/2013   ? KNEE REPLACEMENT Right 05/2014   ? 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 TRANSVENOUS REMOVAL IMPLANTABLE DEFIBRILLATOR RIGHT VENTRICULAR LEAD Left 03/13/2018    Performed by Kathreen Cornfield, MD at [facility] CVOR   ? INSERTION RIGHT VENTRICULAR LEAD Left 03/13/2018    Performed by Kathreen Cornfield, MD at [facility] CVOR   ? FLUOROSCOPY - CARDIAC  03/13/2018    Performed by Kathreen Cornfield, MD at [facility] CVOR   ? INSERTION/ REPLACEMENT TEMPORARY PACEMAKER LEAD/ CATHETER  03/13/2018    Performed by Kathreen Cornfield, MD at [facility] CVOR   ? REMOVAL AND REPLACEMENT IMPLANTABLE DEFIBRILLATOR GENERATOR - DUAL LEAD SYSTEM  03/13/2018    Performed by Kathreen Cornfield, MD at [facility] CVOR   ? CARDIOTHORACIC SURGERY STANDBY N/A 03/13/2018    Performed by Cath, Physician at [facility] CVOR   ? INTRACARDIAC CATHETER ABLATION WITH COMPREHENSIVE ELECTROPHYSIOLOGIC EVALUATION - ATRIAL FIBRILLATION, RADIOFREQUENCY N/A 11/22/2018    Performed by Kathreen Cornfield, MD at [facility] EP LAB   ? INTRACARDIAC ELECTROPHYSIOLOGIC 3-DIMENSIONAL MAPPING N/A 11/22/2018    Performed by Kathreen Cornfield, MD at [facility] EP LAB   ? INTRACARDIAC ECHOCARDIOGRAPHY N/A 11/22/2018    Performed by Kathreen Cornfield, MD at [facility] EP LAB   ? POSSIBLE INTRAVENOUS DRUG INFUSION FOR STIMULATION AND PACING N/A 11/22/2018    Performed by Kathreen Cornfield, MD at [facility] EP LAB   ? POSSIBLE INSERTION ESOPHAGEAL DEVIATOR  N/A 11/22/2018    Performed by Kathreen Cornfield, MD at [facility] EP LAB   ? POSSIBLE EXTERNAL CARDIOVERSION N/A 11/22/2018    Performed by Kathreen Cornfield, MD at [facility] EP LAB   ? INTRACARDIAC CATHETER ABLATION WITH COMPREHENSIVE ELECTROPHYSIOLOGIC EVALUATION - ATYPICAL FLUTTER Right 11/22/2018    Performed by Kathreen Cornfield, MD at [facility] EP LAB   ? TRANSESOPHAGEAL ECHOCARDIOGRAM DURING INTERVENTION N/A 11/22/2018    Performed by Cath, Physician at [facility] EP LAB   ? CARDIAC DEFIBRILLATOR PLACEMENT  11/2013, 02-2018    S/P [employer] ICD Lead Extraction and New Lead Implantation   ? HX BACK SURGERY  2013, 2006    Lumbart Laminectomy and Fusion   ? HX CARPAL TUNNEL RELEASE Right 1990s   ? HX HEART CATHETERIZATION         family history includes Cancer in his father; Hypertension in his mother.    Social History     Socioeconomic History   ? Marital status: Married   Tobacco Use   ? Smoking status: Never Smoker   ? Smokeless tobacco: Never Used   Vaping Use   ? Vaping Use: Never used   Substance and Sexual Activity   ? Alcohol use: No     Alcohol/week: 0.0 standard drinks     Comment: very rarely 4 drinks per year   ? Drug use: No       Allergies   Allergen Reactions   ? Clarithromycin RASH     Allergy recorded in SMS: Biaxin~Reactions: RASH~THRUSH   ? Ketoconazole SEE COMMENTS     Had bloodshot eyes after use, and sore.   ? Lisinopril COUGH       There were no vitals filed for this visit.          REVIEW OF SYSTEMS: 10 point ROS obtained and negative      PHYSICAL EXAM:    General: Alert, cooperative, no acute distress.  HEENT: Normocephalic, atraumatic.  Neck: Supple.  Lungs: Unlabored respirations, bilateral and equal chest excursion.  Heart: Regular rate.  Skin: Warm and dry to touch.  Abdomen: Nondistended.  MSK: No deformity.  Neurological: Alert and oriented x3.        IMPRESSION:    1. Arthritis of right knee         PLAN: Other Right Genicular nerve RFA

## 2021-04-20 NOTE — Discharge Instructions - Supplementary Instructions
RADIOFREQUENCY ABLATION   DISCHARGE INSTRUCTIONS    Go directly home and rest. DO NOT drive today.  If you have a dressing or bandage on, you may remove it in 12 hours. You may shower tomorrow.   Apply ice to the procedure site at 20-minute intervals frequently for the next 24 hours.  If you had an IV for your procedure and a lump or redness occurs at the site, you may apply a warm, moist compress for 10minutes four times daily for 2-3 days.  If you had sedation for your procedure:  The anesthetic may make you drowsy and slow to react for up to 12 hours.  For the next 12 hours do not consume alcohol, drive, operate machinery, sign legal documents, or work.  Rest at home today.  A responsible adult needs to stay with you today and overnight.  Start with clear liquids and advance as tolerated.  Resume all previous medication unless directed not to.  It is not uncommon to experience an increase in pain for several days after the procedure.  Some people may experience a sunburn-like sensation for a week in the area of the ablation.  If it becomes bothersome despite over the counter NSAIDs (if you can take these), call for further advice.  Ease back into your activities and daily routine as tolerated over the next 2-3 days.  The beneficial effects from the radiofrequency procedure may take several weeks to be noticed.  There should be minimal drainage and no swelling or redness at the injection sites. You should not experience a severe headache. You should not run a fever over 101F. If any of these occur, call to report this to a nurse at [phone]. If you are calling after 4PM or on weekends/holidays, call [phone] and ask to have the on-call pain management resident paged or go to your local emergency room.  Follow up appointment as needed.     If you have questions for the surgery center, call [facility] at [phone].

## 2021-04-20 NOTE — Procedures
Attending Surgeon: Evelina Bucy, MD    Anesthesia: Local    Pre-Procedure Diagnosis:   1. Arthritis of right knee    2. Chronic pain of right knee        Post-Procedure Diagnosis:   1. Arthritis of right knee    2. Chronic pain of right knee             DESTRUCTION OF OTHER PERIPHERAL NERVE OR BRANCH FLUORO    Laterality: right    Location: Knee       Consent:   Consent obtained: written  Consent given by: patient  Risks discussed: allergic reaction, bleeding, bruising, infection, nerve damage, no change or worsening in pain, reaction to medication, seizure, sensation loss, skin burn, swelling and weakness  Alternatives discussed: alternative treatment, delayed treatment, no treatment and referral  Discussed with patient the purpose of the treatment/procedure, other ways of treating my condition, including no treatment/ procedure and the risks and benefits of the alternatives. Patient has decided to proceed with treatment/procedure.        Universal Protocol:  Relevant documents: relevant documents present and verified  Test results: test results available and properly labeled  Imaging studies: imaging studies available  Required items: required blood products, implants, devices, and special equipment available  Site marked: the operative site was marked  Patient identity confirmed: Patient identify confirmed verbally with patient.        Time out: Immediately prior to procedure a time out was called to verify the correct patient, procedure, equipment, support staff and site/side marked as required      Patient has had 2 Medial Branch Blocks with greater than 80% relief that lasted more than 1 hour which is consistent with the local agent used.     Procedures Details:   Indications: pain     Prep: chlorhexidine  Local anesthetic:  1% lidocaine - 0.15mL  Sedation: anxiolysisPatient position: supine  Estimated Blood Loss: minimal  Specimens: none    Guidance: fluoroscopy  Needle size: 18 G  Active Needle Tip Length: 10mm  Neurolytic Technique: Radiofrequency Ablation      Patient tolerance: Patient tolerated the procedure well with no immediate complications. Pressure was applied, and hemostasis was accomplished.  Comments:   PROCEDURE: right knee superolateral genicular, superomedial genicular, and inferomedial genicular nerve radiofrequency ablation with fluoroscopic needle guidance.    SURGEON: Evelina Bucy, MD.    MEDICATIONS: Lidocaine 2 percent with dexamethasone 2mg  as outlined below.    ANESTHESIA: Local anesthetic, 1 mL of 1 percent lidocaine at each site.     SEDATION: Versed and Fentanyl as per nurse charting.    ESTIMATED BLOOD LOSS: None.    SPECIMENS REMOVED: None    COMPLICATIONS: None.    DESCRIPTION OF PROCEDURE: A time-out was taken to identify the correct patient, procedure, and site prior to starting the procedure. With the patient lying in the supine position, the area was prepped and draped in the usual sterile fashion using DuraPrep and a fenestrated drape. The right lower extremity was placed on pillows so that the lateral views could later be obtained without issue. The local anesthetic was given using a 27-gauge 1.5 inch needle by raising a skin wheal and going down to the hub at each of the 3 locations, which correlated with the superior lateral genicular nerve, superior medial genicular nerve, and the inferior medial geniculate nerve at the superior medial and superior lateral epicondyle of the femur as well as at the distal aspect of the  medial tibial epicondyle.     An 18-gauge, 10mm active tip RF needle was then inserted at each of these locations and advanced under AP view until they were placed at the landmarks noted above. A lateral view was then obtained and position of the needles was confirmed to be at the mid level of the femur and tibia under fluoroscopy.      After negative aspiration, 1 mL of the solution above was injected at each site. A 90-second ablation at 80 degrees Celsius was then performed at all 3 sites.     The procedure was completed without complication and was tolerated well. The patient was monitored after the procedure. The patient was given postprocedure discharge instructions to follow at home. The patient was discharged in stable condition.     Evelina Bucy, MD, MBA         Radiofrequency time [phone]  Radiofrequency Temperature 80        Administrations This Visit     fentaNYL citrate PF (SUBLIMAZE) injection 50-100 mcg     Admin Date  04/20/2021 Action  Given Dose  50 mcg Route  Intravenous Administered By  Pricilla Riffle, RN          lidocaine PF 1% (10 mg/mL) injection 2 mL     Admin Date  04/20/2021 Action  Given Dose  2 mL Route  Injection Administered By  Evelina Bucy, MD          midazolam (VERSED) injection 1-2 mg     Admin Date  04/20/2021 Action  Given Dose  1 mg Route  Intravenous Administered By  Pricilla Riffle, RN              Estimated blood loss: none or minimal  Specimens: none  Patient tolerated the procedure well with no immediate complications. Pressure was applied, and hemostasis was accomplished.

## 2021-04-21 ENCOUNTER — Encounter: Admit: 2021-04-21 | Discharge: 2021-04-21 | Payer: MEDICARE

## 2021-04-25 ENCOUNTER — Encounter: Admit: 2021-04-25 | Discharge: 2021-04-25 | Payer: MEDICARE

## 2021-04-29 ENCOUNTER — Encounter: Admit: 2021-04-29 | Discharge: 2021-04-29 | Payer: MEDICARE

## 2021-04-29 ENCOUNTER — Ambulatory Visit: Admit: 2021-04-29 | Discharge: 2021-04-29 | Payer: MEDICARE

## 2021-04-29 DIAGNOSIS — I509 Heart failure, unspecified: Secondary | ICD-10-CM

## 2021-04-29 DIAGNOSIS — I2510 Atherosclerotic heart disease of native coronary artery without angina pectoris: Secondary | ICD-10-CM

## 2021-04-29 DIAGNOSIS — T148XXA Other injury of unspecified body region, initial encounter: Secondary | ICD-10-CM

## 2021-04-29 DIAGNOSIS — I219 Acute myocardial infarction, unspecified: Secondary | ICD-10-CM

## 2021-04-29 DIAGNOSIS — G20 Parkinson's disease: Secondary | ICD-10-CM

## 2021-04-29 DIAGNOSIS — G4733 Obstructive sleep apnea (adult) (pediatric): Secondary | ICD-10-CM

## 2021-04-29 DIAGNOSIS — I340 Nonrheumatic mitral (valve) insufficiency: Secondary | ICD-10-CM

## 2021-04-29 DIAGNOSIS — M549 Dorsalgia, unspecified: Secondary | ICD-10-CM

## 2021-04-29 DIAGNOSIS — R7303 Prediabetes: Secondary | ICD-10-CM

## 2021-04-29 DIAGNOSIS — R269 Unspecified abnormalities of gait and mobility: Secondary | ICD-10-CM

## 2021-04-29 DIAGNOSIS — K219 Gastro-esophageal reflux disease without esophagitis: Secondary | ICD-10-CM

## 2021-04-29 DIAGNOSIS — I10 Essential (primary) hypertension: Secondary | ICD-10-CM

## 2021-04-29 DIAGNOSIS — K449 Diaphragmatic hernia without obstruction or gangrene: Secondary | ICD-10-CM

## 2021-04-29 DIAGNOSIS — M1990 Unspecified osteoarthritis, unspecified site: Secondary | ICD-10-CM

## 2021-04-29 DIAGNOSIS — R053 Chronic cough: Secondary | ICD-10-CM

## 2021-04-29 DIAGNOSIS — I255 Ischemic cardiomyopathy: Secondary | ICD-10-CM

## 2021-04-29 DIAGNOSIS — E669 Obesity, unspecified: Secondary | ICD-10-CM

## 2021-04-29 DIAGNOSIS — N529 Male erectile dysfunction, unspecified: Secondary | ICD-10-CM

## 2021-04-29 DIAGNOSIS — M48061 Spinal stenosis, lumbar region without neurogenic claudication: Secondary | ICD-10-CM

## 2021-04-29 DIAGNOSIS — J302 Other seasonal allergic rhinitis: Secondary | ICD-10-CM

## 2021-04-29 DIAGNOSIS — G4752 REM sleep behavior disorder: Secondary | ICD-10-CM

## 2021-04-29 DIAGNOSIS — G5600 Carpal tunnel syndrome, unspecified upper limb: Secondary | ICD-10-CM

## 2021-04-29 DIAGNOSIS — E785 Hyperlipidemia, unspecified: Secondary | ICD-10-CM

## 2021-04-29 DIAGNOSIS — Z95810 Presence of automatic (implantable) cardiac defibrillator: Secondary | ICD-10-CM

## 2021-04-29 DIAGNOSIS — J449 Chronic obstructive pulmonary disease, unspecified: Secondary | ICD-10-CM

## 2021-04-29 DIAGNOSIS — I4891 Unspecified atrial fibrillation: Secondary | ICD-10-CM

## 2021-04-29 MED ORDER — CARBIDOPA-LEVODOPA 25-100 MG PO TAB
1 | ORAL_TABLET | Freq: Three times a day (TID) | ORAL | 5 refills | Status: AC
Start: 2021-04-29 — End: ?

## 2021-04-29 NOTE — Progress Notes
Reason for visit: [name] is 75 y.o. right handed male who presents today for a follow-up.   Accompanied by: Wife    Interval history:   Since last visit tremor in left hand is worse. He sometimes notice tremor in right hand. Tremor is present with activities> rest. His gait is about the same, knee pain impact his gait. Lower back pain is about the same and he is having radiating pain. He denies falls.   He is noticing worsening of memory issues. He denies hallucinations.   He has dry mouth. He intermittently notice diplopia.   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 Wife reports acting out in dreams once a month. He use CPAP at night.   He has constipation and use miralax.    History at initial visit in 10/2020: He started noticing tremor around 20 years ago. Wife remembers noticing tremor when he hold telephone. Tremor has slightly worsened over time. He now notice tremor in both hands, tremor is intermittent. Tremor occurs with activities and also at rest. He denies tremor in head, voice or legs.   He feels steady to most extent, sometimes feels unsteady. He use a cane to walk. Has history of bilateral knee replacement and chronic lower back pain. He is being considered for spinal cord stimulator and he would like to get another opinion.   He denies dysphagia, speech changes.   He has word finding difficulty. Reports mild issues with short term memory.    He rarely report yelling in sleep. He denies hallucinations, denies constipation. He has impaired sense of smell for several years.   He denies dizziness on standing up.   He has history of CAD, COPD, atrial fibrillation, s/p ICD, HTN, OSA, neuropathy.       Medications:   Reviewed in patient?s electronic medical record   Current Outpatient Medications   Medication Sig Dispense Refill   ? acetaminophen (TYLENOL) 325 mg tablet Take two tablets by mouth every 4 hours as needed for Pain. 300 tablet 1   ? albuterol (VENTOLIN HFA, PROAIR HFA) 90 mcg/actuation inhaler Inhale 2 Puffs by mouth four times daily as needed for Wheezing.     ? aspirin 81 mg chewable tablet Take 1 Tab by mouth daily. WAIT to restart until finished taking lovenox injections. (Patient taking differently: Chew 81 mg by mouth at bedtime daily.) 90 Tab 3   ? cetirizine (ZYRTEC) 10 mg tablet Take 10 mg by mouth daily.     ? cholecalciferol (Vitamin D3) (VITAMIN D-3) 1,000 units tablet Take 1,000 Units by mouth twice daily.       ? dextromethorphan/guaiFENesin (MUCINEX DM) 30/600 mg Tb12 Take 2 tablets by mouth daily as needed.     ? docusate (COLACE) 100 mg capsule Take 200 mg by mouth at bedtime daily. Pt is taking 550 mg daily     ? ezetimibe (ZETIA) 10 mg tablet Take one tablet by mouth at bedtime daily. 90 tablet 3   ? famotidine (PEPCID) 40 mg tablet Take 40 mg by mouth at bedtime daily.     ? fexofenadine(+) (ALLEGRA) 180 mg tablet Take 180 mg by mouth daily.     ? fluticasone-umeclidin-vilanter (TRELEGY ELLIPTA) 100-62.5-25 mcg inhaler Inhale 1 Dose by mouth into the lungs at bedtime daily.     ? furosemide (LASIX) 40 mg tablet TAKE 2 TABLETS ONCE DAILY, TAKE 3 TABLETS ON Monday/WEDNESDAY/FRIDAY AS DIRECTED BY       CARDIOLOGY 270  tablet 3   ? gabapentin (NEURONTIN) 300 mg capsule Take one capsule by mouth every 6 hours. 270 capsule 3   ? glucosamine su 2KCl-chondroit 500-400 mg tab Take 1 Tab by mouth twice daily.     ? ipratropium bromide (ATROVENT) 42 mcg (0.06 %) nasal spray Apply 2 sprays to each nostril as directed twice daily as needed.     ? losartan (COZAAR) 50 mg tablet Take one tablet by mouth daily. 90 tablet 3   ? metoprolol XL (TOPROL XL) 100 mg extended release tablet Take one tablet by mouth daily. 90 tablet 3   ? mucus clearing device (AEROBIKA OSCILLATING PEP SYSTM MISC) Use  as directed. Use with salt water and inhale twice daily     ? MYRBETRIQ 50 mg tablet Take 1 tablet by mouth daily. Does not take on days he takes Ditropan     ? OMEGA-3 FATTY ACIDS-FISH OIL PO Take 1 capsule by mouth twice daily.     ? other medication mupirocin ointment and budesonide solution mixed with warm water  Use as nasal irrigation twice daily     ? oxybutynin chloride (DITROPAN) 5 mg tablet Take 5 mg by mouth twice daily. Does not take on days he takes Myrbetriq     ? pantoprazole DR (PROTONIX) 40 mg tablet Take 40 mg by mouth twice daily.     ? polyethylene glycol 3350 (GLYCOLAX; MIRALAX) 17 gram/dose powder Take 17 g by mouth daily. (Patient taking differently: Take 17 g by mouth nightly as needed.) 595 g 3   ? potassium chloride SR (K-DUR) 20 mEq tablet Take one tab on Sunday, Tuesday, Thursday, and Saturday.  Take two tabs on Mon, Wed and Friday. 140 tablet 3   ? rosuvastatin (CRESTOR) 40 mg tablet Take one tablet by mouth at bedtime daily. 90 tablet 3   ? sildenafil(+) (VIAGRA) 100 mg tablet Take 1 Tab by mouth as Needed for Erectile dysfunction. 6 Tab 12   ? spironolactone (ALDACTONE) 25 mg tablet Take one tablet by mouth daily. Take with food. 90 tablet 3   ? tamsulosin (FLOMAX) 0.4 mg capsule Take 0.4 mg by mouth daily. Do not crush, chew or open capsules. Take 30 minutes following the same meal each day.     ? testosterone cypionate 200 mg/mL kit Inject 1 mL into the muscle every 14 days.     ? tiZANidine (ZANAFLEX) 2 mg tablet Take three tablets by mouth at bedtime as needed. 1 to 3 tabs qhs prn 60 tablet 3   ? traMADoL (ULTRAM) 50 mg tablet every 4-6 hours     ? traZODone (DESYREL) 100 mg tablet bedtime     ? Vacuum Erection Device System kit Use as directed for sexual activity. 1 kit 11   ? XARELTO 20 mg tablet TAKE 1 TABLET BY MOUTH DAILY - MUST BE WITH EVENING MEAL       Past Medical History:    Medical History:   Diagnosis Date   ? Atrial fibrillation (HCC) 02/08/2009   ? Back pain    ? CAD (coronary artery disease) 02/08/2009   ? Carpal tunnel syndrome    ? Chronic cough    ? Congestive heart disease (HCC)    ? COPD (chronic obstructive pulmonary disease) (HCC)    ? Erectile dysfunction, vasculogenic    ? Fracture    ? GERD (gastroesophageal reflux disease)     controlled with elevated HOB, protonix and pepcid    ?  Hiatal hernia    ? Hyperlipemia 02/11/2009   ? Hypertension 02/11/2009   ? 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 Mitral regurgitation    ? Myocardial infarction [facility]) 2006   ? Obesity, Class I, 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)    ? OSA on CPAP 02/11/2009   ? Osteoarthritis    ? Pre-diabetes    ? S/P ICD (internal cardiac defibrillator) procedure 02/11/2009   ? Seasonal allergies      Social History:  No smoking, no alcohol, no drugs.     He is retired. Lives with his wife.    Family History:   No family history of tremors    Allergies:   Allergies   Allergen Reactions   ? Clarithromycin RASH     Allergy recorded in SMS: Biaxin~Reactions: RASH~THRUSH   ? Ketoconazole SEE COMMENTS     Had bloodshot eyes after use, and sore.   ? Lisinopril COUGH         Review of systems:   Neurological: Positive for tremors.   All other systems reviewed and are negative.  ?    Exam:      VITAL SIGNS:   Vitals:    04/29/21 1430   BP: 102/62   Pulse: 92       GENERAL:   NAD      NEUROLOGICAL EXAM:     MS: Alert and oriented x 3, fluent speech, normal attention  MoCA score 26/30  CN:  EOMI, face symmetric, hearing intact to conversation, uvula/palate elevates symmetrically, tongue midline, shoulder shrug symmetric  MOTOR:  Tone - mild rigidity in LUE  Strength - 5/5 throughout in UE and LE  Tremor: There is intermittent rest tremor in left hand,  slight action tremor bilaterally   Bradykinesia with FT, RAM in left hand  Dyskinesia: none  SENSATION: Intact to light touch  COORDINATION: Intact to FTN in UE bilaterally, LE intact to HTS BL  REFLEXES: diminished throughout, toes mute bilaterally   GAIT: Able to stand up without difficulty, walked slowly, equal arm swing, antalgic gait          Assessment/Plan:    Mr Samir Buth presents today for follow up. He was seen initially for evaluation of tremors. He has tremor for last 20 years, tremor fluctuates. During last visit no features of parkinsonism were seen. Since last visit tremor is worse. On exam today he has intermittent rest tremor, and bradykinesia. 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 Discussed to do a trial of levodopa to see if that improves tremor and gait.     Cognitive changes - MoCA score 26/30, continue to monitor.     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 He follows in spine clinic. Discussed fall precautions.     - Take the medication Carbidopa/levodopa (Sinemet) 25/100 as below.     Medication         Morning Noon         Evening              Week 1 1/2 1/2     Week 2 1/2 1/2 1/2    Week 3 1 1 1       - monitor for improvement in slowness, tremor and walking.   - Monitor for side effects including nausea, lightheadedness, drowsiness, dizziness,  appetite changes, constipation , hallucinations etc.  Stay on the dose which provides benefit and is well tolerated.   - Updates [place] in few weeks after trial of medication  - Follow up in spine clinic for back pain  - Please call [phone] for any questions or concerns. Or send a message on mychart.   - Follow up in 6 months or early if needed     Please note that documentation of records were done during a busy neurological clinic. Attempts have been made to review the document for any errors. Please excuse for brevity and typographical errors.     Ulyses Jarred, MD  [facility]

## 2021-04-29 NOTE — Patient Instructions
-   Take the medication Carbidopa/levodopa (Sinemet) 25/100 as below.      Medication         Morning Noon         Evening              Week 1 1/2 1/2       Week 2 1/2 1/2 1/2     Week 3 1 1 1         - monitor for improvement in slowness, tremor and walking.     - Monitor for side effects including nausea, lightheadedness, drowsiness, dizziness, appetite changes, constipation , hallucinations etc.  Stay on the dose which provides benefit and is well tolerated.     - Updates in few weeks after trial of medications    - Follow up in spine clinic for back pain    - Please call [phone] for any questions or concerns. Or send a message on   mychart.     - Follow up in 6 months or early if needed

## 2021-04-29 NOTE — Progress Notes
Documenting MOCA score

## 2021-04-30 ENCOUNTER — Encounter: Admit: 2021-04-30 | Discharge: 2021-04-30 | Payer: MEDICARE

## 2021-05-04 ENCOUNTER — Encounter: Admit: 2021-05-04 | Discharge: 2021-05-04 | Payer: MEDICARE

## 2021-05-04 DIAGNOSIS — G4733 Obstructive sleep apnea (adult) (pediatric): Secondary | ICD-10-CM

## 2021-05-04 NOTE — Telephone Encounter
Called and spoke with patients wife and advised her of the results as noted along with the plan of care.  Patient is currently under the care of Cardiology here at [facility] so Cardiology will see study.  Patient is currently on 3 mg of Melatonin at night and does pretty good with that dose.  Wife reports that maybe one time a month he will have an outburst in his sleep.

## 2021-05-04 NOTE — Telephone Encounter
-----   Message from Irwin Brakeman, MD sent at 04/30/2021  4:26 PM CDT -----  Regarding: New DME order  Hi Johnny [name],    Please see order below:    ResMed Auto CPAP 9 cm H20, heated tubing, humidifier, filters, all other necessary equipment. Mask used during the titration was a DreamWisp Nasal, Size M with chin strap.     Please schedule follow up 4-6 weeks after setup.    EKG was abnormal. Recommend Cardiology follow up.    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 Please confirm with patient if he started melatonin.    Thank you.

## 2021-05-07 ENCOUNTER — Encounter: Admit: 2021-05-07 | Discharge: 2021-05-07 | Payer: MEDICARE

## 2021-05-07 NOTE — Telephone Encounter
Pt c/o pain goes down to calf along back of left leg to the calf. Procedure on 04/15/21 helped with it initially but pain has returned.     Pt asks if he can have a procedure to help with this pain. Going on a trip mid-September.     Route to Dr. Lourdes Sledge.

## 2021-05-13 ENCOUNTER — Encounter: Admit: 2021-05-13 | Discharge: 2021-05-13 | Payer: MEDICARE

## 2021-05-13 DIAGNOSIS — G4733 Obstructive sleep apnea (adult) (pediatric): Secondary | ICD-10-CM

## 2021-05-13 NOTE — Telephone Encounter
-----   Message from Jennifer A Liebenthal, MD sent at 04/30/2021  4:26 PM CDT -----  Regarding: New DME order  Hi Armida Vickroy,    Please see order below:    ResMed Auto CPAP 9 cm H20, heated tubing, humidifier, filters, all other necessary equipment. Mask used during the titration was a DreamWisp Nasal, Size M with chin strap.     Please schedule follow up 4-6 weeks after setup.    EKG was abnormal. Recommend Cardiology follow up.    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 Please confirm with patient if he started melatonin.    Thank you.

## 2021-05-20 ENCOUNTER — Encounter: Admit: 2021-05-20 | Discharge: 2021-05-20 | Payer: MEDICARE

## 2021-05-20 ENCOUNTER — Ambulatory Visit: Admit: 2021-05-20 | Discharge: 2021-05-20 | Payer: MEDICARE

## 2021-05-20 DIAGNOSIS — T148XXA Other injury of unspecified body region, initial encounter: Secondary | ICD-10-CM

## 2021-05-20 DIAGNOSIS — I509 Heart failure, unspecified: Secondary | ICD-10-CM

## 2021-05-20 DIAGNOSIS — K449 Diaphragmatic hernia without obstruction or gangrene: Secondary | ICD-10-CM

## 2021-05-20 DIAGNOSIS — M1990 Unspecified osteoarthritis, unspecified site: Secondary | ICD-10-CM

## 2021-05-20 DIAGNOSIS — I2510 Atherosclerotic heart disease of native coronary artery without angina pectoris: Secondary | ICD-10-CM

## 2021-05-20 DIAGNOSIS — N529 Male erectile dysfunction, unspecified: Secondary | ICD-10-CM

## 2021-05-20 DIAGNOSIS — I4891 Unspecified atrial fibrillation: Secondary | ICD-10-CM

## 2021-05-20 DIAGNOSIS — M5417 Radiculopathy, lumbosacral region: Secondary | ICD-10-CM

## 2021-05-20 DIAGNOSIS — M549 Dorsalgia, unspecified: Secondary | ICD-10-CM

## 2021-05-20 DIAGNOSIS — I255 Ischemic cardiomyopathy: Secondary | ICD-10-CM

## 2021-05-20 DIAGNOSIS — M5137 Other intervertebral disc degeneration, lumbosacral region: Secondary | ICD-10-CM

## 2021-05-20 DIAGNOSIS — I340 Nonrheumatic mitral (valve) insufficiency: Secondary | ICD-10-CM

## 2021-05-20 DIAGNOSIS — E785 Hyperlipidemia, unspecified: Secondary | ICD-10-CM

## 2021-05-20 DIAGNOSIS — Z95810 Presence of automatic (implantable) cardiac defibrillator: Secondary | ICD-10-CM

## 2021-05-20 DIAGNOSIS — I219 Acute myocardial infarction, unspecified: Secondary | ICD-10-CM

## 2021-05-20 DIAGNOSIS — R053 Chronic cough: Secondary | ICD-10-CM

## 2021-05-20 DIAGNOSIS — R7303 Prediabetes: Secondary | ICD-10-CM

## 2021-05-20 DIAGNOSIS — I10 Essential (primary) hypertension: Secondary | ICD-10-CM

## 2021-05-20 DIAGNOSIS — E669 Obesity, unspecified: Secondary | ICD-10-CM

## 2021-05-20 DIAGNOSIS — G4733 Obstructive sleep apnea (adult) (pediatric): Secondary | ICD-10-CM

## 2021-05-20 DIAGNOSIS — G5600 Carpal tunnel syndrome, unspecified upper limb: Secondary | ICD-10-CM

## 2021-05-20 DIAGNOSIS — J302 Other seasonal allergic rhinitis: Secondary | ICD-10-CM

## 2021-05-20 DIAGNOSIS — J449 Chronic obstructive pulmonary disease, unspecified: Secondary | ICD-10-CM

## 2021-05-20 DIAGNOSIS — K219 Gastro-esophageal reflux disease without esophagitis: Secondary | ICD-10-CM

## 2021-05-20 MED ORDER — LIDOCAINE (PF) 10 MG/ML (1 %) IJ SOLN
2 mL | Freq: Once | INTRAMUSCULAR | 0 refills | Status: CP
Start: 2021-05-20 — End: ?

## 2021-05-20 MED ORDER — IOHEXOL 300 MG IODINE/ML IV SOLN
2 mL | Freq: Once | 0 refills | Status: CP
Start: 2021-05-20 — End: ?

## 2021-05-20 MED ORDER — DEXAMETHASONE SODIUM PHOS (PF) 10 MG/ML IJ EPIDURAL SOLN
15 mg | Freq: Once | EPIDURAL | 0 refills | Status: CP
Start: 2021-05-20 — End: ?

## 2021-05-20 NOTE — Progress Notes
SPINE CENTER  INTERVENTIONAL PAIN PROCEDURE HISTORY AND PHYSICAL    Chief Complaint: Pain    HISTORY OF PRESENT ILLNESS:  LBP with LLE radicular pain    Medical History:   Diagnosis Date   ? Atrial fibrillation (HCC) 02/08/2009   ? Back pain    ? CAD (coronary artery disease) 02/08/2009   ? Carpal tunnel syndrome    ? Chronic cough    ? Congestive heart disease (HCC)    ? COPD (chronic obstructive pulmonary disease) (HCC)    ? Erectile dysfunction, vasculogenic    ? Fracture    ? GERD (gastroesophageal reflux disease)     controlled with elevated HOB, protonix and pepcid    ? Hiatal hernia    ? Hyperlipemia 02/11/2009   ? Hypertension 02/11/2009   ? 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 Mitral regurgitation    ? Myocardial infarction [facility]) 2006   ? Obesity, Class I, 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)    ? OSA on CPAP 02/11/2009   ? Osteoarthritis    ? Pre-diabetes    ? S/P ICD (internal cardiac defibrillator) procedure 02/11/2009   ? Seasonal allergies        Surgical History:   Procedure Laterality Date   ? HUMERUS FRACTURE SURGERY Left 2004   ? MITRAL VALVULOPLASTY  2006    same time as bypass   ? CORONARY ARTERY BYPASS GRAFT  04/14/05    CABGx5 LIMA-LAD, L Rad-Dx, sSVG-RCA-RCA, sSVG-OM & MV Plasty for MR:   ? RHYTHM DEVICE PLACEMENT  2007    defibrillator   ? FOOT SURGERY Bilateral 2012    Tarsal tunnel release w/ bunionectomy and hammer toe    ? EAR SURGERY Bilateral 2012    Ear Tubes   ? SINUS SURGERY  10/2011   ? CARDIOVASCULAR STRESS TEST  09/2013   ? KNEE REPLACEMENT Right 05/2014   ? 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 TRANSVENOUS REMOVAL IMPLANTABLE DEFIBRILLATOR RIGHT VENTRICULAR LEAD Left 03/13/2018    Performed by Kathreen Cornfield, MD at [facility] CVOR   ? INSERTION RIGHT VENTRICULAR LEAD Left 03/13/2018    Performed by Kathreen Cornfield, MD at [facility] CVOR   ? FLUOROSCOPY - CARDIAC  03/13/2018 Performed by Kathreen Cornfield, MD at [facility] CVOR   ? INSERTION/ REPLACEMENT TEMPORARY PACEMAKER LEAD/ CATHETER  03/13/2018    Performed by Kathreen Cornfield, MD at [facility] CVOR   ? REMOVAL AND REPLACEMENT IMPLANTABLE DEFIBRILLATOR GENERATOR - DUAL LEAD SYSTEM  03/13/2018    Performed by Kathreen Cornfield, MD at [facility] CVOR   ? CARDIOTHORACIC SURGERY STANDBY N/A 03/13/2018    Performed by Cath, Physician at [facility] CVOR   ? INTRACARDIAC CATHETER ABLATION WITH COMPREHENSIVE ELECTROPHYSIOLOGIC EVALUATION - ATRIAL FIBRILLATION, RADIOFREQUENCY N/A 11/22/2018    Performed by Kathreen Cornfield, MD at [facility] EP LAB   ? INTRACARDIAC ELECTROPHYSIOLOGIC 3-DIMENSIONAL MAPPING N/A 11/22/2018    Performed by Kathreen Cornfield, MD at [facility] EP LAB   ? INTRACARDIAC ECHOCARDIOGRAPHY N/A 11/22/2018    Performed by Kathreen Cornfield, MD at [facility] EP LAB   ? POSSIBLE INTRAVENOUS DRUG INFUSION FOR STIMULATION AND PACING N/A 11/22/2018    Performed by Kathreen Cornfield, MD at [facility] EP LAB   ? POSSIBLE INSERTION ESOPHAGEAL DEVIATOR N/A 11/22/2018    Performed by Kathreen Cornfield, MD  at [facility] EP LAB   ? POSSIBLE EXTERNAL CARDIOVERSION N/A 11/22/2018    Performed by Kathreen Cornfield, MD at [facility] EP LAB   ? INTRACARDIAC CATHETER ABLATION WITH COMPREHENSIVE ELECTROPHYSIOLOGIC EVALUATION - ATYPICAL FLUTTER Right 11/22/2018    Performed by Kathreen Cornfield, MD at [facility] EP LAB   ? TRANSESOPHAGEAL ECHOCARDIOGRAM DURING INTERVENTION N/A 11/22/2018    Performed by Cath, Physician at [facility] EP LAB   ? CARDIAC DEFIBRILLATOR PLACEMENT  11/2013, 02-2018    S/P Fidel[employer] ICD Lead Extraction and New Lead Implantation   ? HX BACK SURGERY  2013, 2006    Lumbart Laminectomy and Fusion   ? HX CARPAL TUNNEL RELEASE Right 1990s   ? HX HEART CATHETERIZATION         family history includes Cancer in his father; Hypertension in his mother.    Social History     Socioeconomic History   ? Marital status: Married   Tobacco Use   ? Smoking status: Never Smoker   ? Smokeless tobacco: Never Used   Vaping Use   ? Vaping Use: Never used   Substance and Sexual Activity   ? Alcohol use: No     Alcohol/week: 0.0 standard drinks     Comment: very rarely 4 drinks per year   ? Drug use: No       Allergies   Allergen Reactions   ? Clarithromycin RASH     Allergy recorded in SMS: Biaxin~Reactions: RASH~THRUSH   ? Ketoconazole SEE COMMENTS     Had bloodshot eyes after use, and sore.   ? Lisinopril COUGH       There were no vitals filed for this visit.          REVIEW OF SYSTEMS: 10 point ROS obtained and negative      PHYSICAL EXAM:    General: Alert, cooperative, no acute distress.  HEENT: Normocephalic, atraumatic.  Neck: Supple.  Lungs: Unlabored respirations, bilateral and equal chest excursion.  Heart: Regular rate.  Skin: Warm and dry to touch.  Abdomen: Nondistended.  MSK: No deformity.  Neurological: Alert and oriented x3.        IMPRESSION:  Lumbosacral radiculopathy     PLAN: Left L5-S2 TFESI    This patient failed to have improvement of their pain with epidural injection. Given the severity of their pain, a repeat injection is being performed at least 14 days later with a different level

## 2021-05-20 NOTE — Discharge Instructions - Supplementary Instructions
GENERAL POST PROCEDURE INSTRUCTIONS  Physician: _________________________________  Procedure Completed Today:  Joint Injection (hip, knee, shoulder)  Cervical Epidural Steroid Injection  Cervical Transforaminal Steroid Injection  Trigger Point Injection  Caudal Epidural Steroid Injection  Pudendal Nerve Block  Other _____________________ Thoracic Epidural Steroid Injection  Lumbar Epidural Steroid Injection  Lumbar Transforaminal Steroid Injection  Facet Joint Injection  Celiac Nerve Block  Sacrococcygeal  Sacroiliac Joint Injection   Important information following your procedure today:  You may drive today     If you had sedation, you may NOT drive today  Rest at home for the next 6 hours.  You may then begin to resume your normal activities.  DO NOT drive any vehicle, operate any power tools, drink alcohol, make any major decisions, or sign any legal documents for the next 12 hours.  Pain relief may not be immediate. It is possible you may even experience an increase in pain during the first 24-48 hours followed by a gradual decrease of your pain.  Though the procedure is generally safe, and complications are rare, we do ask that you be aware of any of the following:  Any swelling, persistent redness, new bleeding or drainage from the site of the injection.  You should not experience a severe headache.  You should not run a fever over 101oF.  New onset of sharp, severe back and or neck pain.  New onset of upper or lower extremity numbness or weakness.  New difficulty controlling bowel or bladder function after injection.  New shortness of breath.  ** If any of these occur, please call to report this occurrence to a nurse at [phone]. If you are calling after 4:00 p.m. or on weekends or holidays, please call [phone] and ask to have the resident physician on call for the physician paged or go to your local emergency room.  You may experience soreness at the injection site. Ice can be applied at 20-minute intervals for the first 24 hours. The following day you may alternate ice with heat if you are experiencing muscle tightness, otherwise continue with ice. Ice works best at decreasing pain. Avoid application of direct heat, hot showers or hot tubs today.  Avoid strenuous activity today. You many resume your regular activities and exercise tomorrow.  Patients with diabetes may see an elevation in blood sugars for 7-10 days after the injection. It is important to pay close attention to your diet, check your blood sugars daily and report extreme elevations to the physician that manages your diabetes.  Patients taking daily blood thinners can resume their regular dose this evening.  It is important that you take all medications ordered by your pain physician. Taking medications as ordered is an important part of your pain care plan. If you cannot continue the medication plan, please notify the physician.    Possible side effects to steroids that may occur:  Flushing or redness of the face  Irritability  Fluid retention  Change in women's menses  Minor headache    If you are unable to keep your upcoming appointment, please notify the [facility] scheduler at [phone] at least 24 hours in advance. If you have questions for the surgery center, call [facility] at [phone].

## 2021-05-20 NOTE — Procedures
Attending Surgeon: Evelina Bucy, MD    Anesthesia: Local    Pre-Procedure Diagnosis:   1. Lumbosacral radiculopathy    2. DDD (degenerative disc disease), lumbosacral        Post-Procedure Diagnosis:   1. Lumbosacral radiculopathy    2. DDD (degenerative disc disease), lumbosacral             [place] [facility] INJECT SNRB/TFESI LUMBAR/SACRAL  Procedure: transforaminal epidural    Laterality: left   on 05/20/2021 1:26 PM  Location: Lumbar/Sacral -  L5-S1 and S1-2      Consent:   Consent obtained: written  Consent given by: patient  Risks discussed: allergic reaction, bleeding, bruising, infection, nerve damage, no change or worsening in pain, reaction to medication, seizure, swelling and weakness  Alternatives discussed: alternative treatment, delayed treatment and no treatment  Discussed with patient the purpose of the treatment/procedure, other ways of treating my condition, including no treatment/ procedure and the risks and benefits of the alternatives. Patient has decided to proceed with treatment/procedure.        Universal Protocol:  Relevant documents: relevant documents present and verified  Site marked: the operative site was marked  Patient identity confirmed: Patient identify confirmed verbally with patient.        Time out: Immediately prior to procedure a time out was called to verify the correct patient, procedure, equipment, support staff and site/side marked as required      Procedures Details:   Indications: pain   Prep: chlorhexidine  Patient position: prone  Estimated Blood Loss: minimal  Specimens: none  Number of Joints: 2  Guidance: fluoroscopy  Contrast: Procedure confirmed with contrast under live fluoroscopy.  Needle and Epidural Catheter: quincke  Needle size: 25 G  Injection procedure: Incremental injection and Negative aspiration for blood  Patient tolerance: Patient tolerated the procedure well with no immediate complications. Pressure was applied, and hemostasis was accomplished.  Outcome: Pain relieved  Comments:   LUMBAR/SACRAL TRANSFORAMINAL EPIDURAL STEROID INJECTION PROCEDURE:    1) left L5-S1 and S1-2 transforaminal epidural steroid injection  2) Fluoroscopic needle guidance    REASON FOR PROCEDURE: Lumbar radiculopathy    PHYSICIAN: Evelina Bucy, MD    MEDICATIONS INJECTED: 0.75 mL of Dexamethasone (7.5 mg) + 0.75 ml lidocaine 1% at each level    LOCAL ANESTHETIC INJECTED: 1 mL of 1% lidocaine per site    SEDATION MEDICATIONS: None    ESTIMATED BLOOD LOSS: None    SPECIMENS REMOVED: None    COMPLICATIONS: None    TECHNIQUE: Time-out was taken to identify the correct patient, procedure and side prior to starting the procedure. Lying in a prone position, the patient was prepped and draped in the usual sterile fashion using DuraPrep and a fenestrated drape. The area to be injected was determined under fluoroscopic guidance. Local anesthetic was given by raising a skin wheal and going down to the hub of a 27-gauge 1.25-inch needle.     The 3.5-inch 25-gauge Quincke needle was advanced toward the 6 o?clock position of the pedicle at each above-named nerve root level. The needle was advanced to the final position via a lateral fluoroscopic intermittent image. Omnipaque 240 was injected under live fluoroscopy and showed epidural spread and there was no vascular runoff. After a negative aspiration, the medication was then injected.     The procedure was completed without complications and was tolerated well. The patient was monitored after the procedure. The patient (or responsible party) was given post-procedure and discharge instructions to follow  at home. The patient was discharged in stable condition.             Estimated blood loss: none or minimal  Specimens: none  Patient tolerated the procedure well with no immediate complications. Pressure was applied, and hemostasis was accomplished.

## 2021-05-21 ENCOUNTER — Encounter: Admit: 2021-05-21 | Discharge: 2021-05-21 | Payer: MEDICARE

## 2021-06-02 ENCOUNTER — Encounter: Admit: 2021-06-02 | Discharge: 2021-06-02 | Payer: MEDICARE

## 2021-06-02 NOTE — Telephone Encounter
Return telephone call to patient. Nurse spoke to patient's wife Leta Jungling. Left message for Leta Jungling to return phone call. Name and call back number provided.

## 2021-06-02 NOTE — Telephone Encounter
Patient calling for follow up after three weeks of medication. "I do not notice any bad effects side effects" "I believe my shaking in my arm has been reduced quite a bit, but, not completely and my writing is better" "I do not know if it has affected my gait or pace" "I feel like it is the same" His wife states "I think he walks a little a easier and a little faster" Nurse notified patient she will send 3-4 week follow up/update to Dr. Raleigh Callas. Patient verbalized understanding.

## 2021-06-15 NOTE — Patient Instructions
General Instructions:  How to reach me: Please send a MyChart message to the Spine Center or leave a voicemail for my nurse Rebecca at [phone].  Scheduling: Our scheduling phone number is [phone].  How to get a medication refill: Five business days before refill needed, please use the MyChart Refill request or contact your pharmacy directly to request medication refills.   How to receive your test results: If you have signed up for MyChart, you will receive your test results and messages from me this way. Otherwise, you will get a phone call or letter. If you are expecting results and have not heard from my office within 2 weeks of your testing, please send a MyChart message or call my office.  Support for many chronic illnesses is available through [facility]: [email or web address] or [phone].  For questions on nights, weekends or holidays, call the operator at [phone], and ask for the doctor on call for Anesthesia Pain Management.

## 2021-06-18 ENCOUNTER — Encounter: Admit: 2021-06-18 | Discharge: 2021-06-18 | Payer: MEDICARE

## 2021-06-18 ENCOUNTER — Ambulatory Visit: Admit: 2021-06-18 | Discharge: 2021-06-18 | Payer: MEDICARE

## 2021-06-18 DIAGNOSIS — M5414 Radiculopathy, thoracic region: Secondary | ICD-10-CM

## 2021-06-21 ENCOUNTER — Encounter: Admit: 2021-06-21 | Discharge: 2021-06-21 | Payer: MEDICARE

## 2021-06-21 DIAGNOSIS — Z95810 Presence of automatic (implantable) cardiac defibrillator: Secondary | ICD-10-CM

## 2021-06-21 NOTE — Progress Notes
Exam: MRI T Spine WO     Ordering physician: Dr. Irma Newness      This is 1.5 and 3T conditional.   Pt need only device check prior.   Had C and L spine here previously      09/28/21 1130 DC/1230 MRI

## 2021-06-22 ENCOUNTER — Encounter: Admit: 2021-06-22 | Discharge: 2021-06-22 | Payer: MEDICARE

## 2021-07-19 ENCOUNTER — Encounter: Admit: 2021-07-19 | Discharge: 2021-07-19 | Payer: MEDICARE

## 2021-07-19 NOTE — Telephone Encounter
Received notification that  [employer] has not been connected since 07/02/21. Patient was instructed to look at his/her transmitter to make sure that it is plugged into power and send a manual transmission to reconnect the transmitter. If he/she has any questions about how to send a transmission or if the transmitter does not appear to be working properly, they need to contact the device company directly. Patient was provided with that contact number. Requested the patient send [place] a MyChart message or contact our device nurses at [phone] to let us know after they have sent their transmission.LVM for pt to reconnect to monitor. BC     Note: Patient needs to send a manual remote interrogation to reestablish communication to his/her remote transmitter.

## 2021-07-21 ENCOUNTER — Encounter: Admit: 2021-07-21 | Discharge: 2021-07-21 | Payer: MEDICARE

## 2021-07-21 NOTE — Telephone Encounter
Pt left a VM today but didn't say why.  I called back. He was not there but I spoke to his wife.  I suspect he wanted to make sure we received the remote he sent on 07/19/21 at 10pm.  She agreed.  I told her we got it but she said they had a very hard time getting it to go thru, took 6-7 tries.  He has an old 19C "gray box" transmitter with a WireX cell adapter they have had for a while too.  I suspect this may be part of the, not-4G trouble. I gave her number to call [facility] support [phone] to see about getting a new transmitter.

## 2021-07-28 NOTE — Patient Instructions
General Instructions:  How to reach me: Please send a MyChart message to the Spine Center or leave a voicemail for my nurse Rebecca at [phone].  Scheduling: Our scheduling phone number is [phone].  How to get a medication refill: Five business days before refill needed, please use the MyChart Refill request or contact your pharmacy directly to request medication refills.   How to receive your test results: If you have signed up for MyChart, you will receive your test results and messages from me this way. Otherwise, you will get a phone call or letter. If you are expecting results and have not heard from my office within 2 weeks of your testing, please send a MyChart message or call my office.  Support for many chronic illnesses is available through [facility]: [email or web address] or [phone].  For questions on nights, weekends or holidays, call the operator at [phone], and ask for the doctor on call for Anesthesia Pain Management.

## 2021-07-30 ENCOUNTER — Encounter: Admit: 2021-07-30 | Discharge: 2021-07-30 | Payer: MEDICARE

## 2021-07-30 ENCOUNTER — Ambulatory Visit: Admit: 2021-07-30 | Discharge: 2021-07-30 | Payer: MEDICARE

## 2021-07-30 DIAGNOSIS — M1990 Unspecified osteoarthritis, unspecified site: Secondary | ICD-10-CM

## 2021-07-30 DIAGNOSIS — N529 Male erectile dysfunction, unspecified: Secondary | ICD-10-CM

## 2021-07-30 DIAGNOSIS — K449 Diaphragmatic hernia without obstruction or gangrene: Secondary | ICD-10-CM

## 2021-07-30 DIAGNOSIS — G4733 Obstructive sleep apnea (adult) (pediatric): Secondary | ICD-10-CM

## 2021-07-30 DIAGNOSIS — I340 Nonrheumatic mitral (valve) insufficiency: Secondary | ICD-10-CM

## 2021-07-30 DIAGNOSIS — K219 Gastro-esophageal reflux disease without esophagitis: Secondary | ICD-10-CM

## 2021-07-30 DIAGNOSIS — E785 Hyperlipidemia, unspecified: Secondary | ICD-10-CM

## 2021-07-30 DIAGNOSIS — I219 Acute myocardial infarction, unspecified: Secondary | ICD-10-CM

## 2021-07-30 DIAGNOSIS — T148XXA Other injury of unspecified body region, initial encounter: Secondary | ICD-10-CM

## 2021-07-30 DIAGNOSIS — I10 Essential (primary) hypertension: Secondary | ICD-10-CM

## 2021-07-30 DIAGNOSIS — M549 Dorsalgia, unspecified: Secondary | ICD-10-CM

## 2021-07-30 DIAGNOSIS — I2510 Atherosclerotic heart disease of native coronary artery without angina pectoris: Secondary | ICD-10-CM

## 2021-07-30 DIAGNOSIS — R053 Chronic cough: Secondary | ICD-10-CM

## 2021-07-30 DIAGNOSIS — I255 Ischemic cardiomyopathy: Secondary | ICD-10-CM

## 2021-07-30 DIAGNOSIS — Z95810 Presence of automatic (implantable) cardiac defibrillator: Secondary | ICD-10-CM

## 2021-07-30 DIAGNOSIS — I509 Heart failure, unspecified: Secondary | ICD-10-CM

## 2021-07-30 DIAGNOSIS — I4891 Unspecified atrial fibrillation: Secondary | ICD-10-CM

## 2021-07-30 DIAGNOSIS — J449 Chronic obstructive pulmonary disease, unspecified: Secondary | ICD-10-CM

## 2021-07-30 DIAGNOSIS — E669 Obesity, unspecified: Secondary | ICD-10-CM

## 2021-07-30 DIAGNOSIS — M5417 Radiculopathy, lumbosacral region: Secondary | ICD-10-CM

## 2021-07-30 DIAGNOSIS — R7303 Prediabetes: Secondary | ICD-10-CM

## 2021-07-30 DIAGNOSIS — J302 Other seasonal allergic rhinitis: Secondary | ICD-10-CM

## 2021-07-30 DIAGNOSIS — G5600 Carpal tunnel syndrome, unspecified upper limb: Secondary | ICD-10-CM

## 2021-07-30 MED ORDER — TIZANIDINE 2 MG PO TAB
6 mg | ORAL_TABLET | Freq: Every evening | ORAL | 3 refills | Status: AC | PRN
Start: 2021-07-30 — End: ?

## 2021-08-02 ENCOUNTER — Encounter: Admit: 2021-08-02 | Discharge: 2021-08-02 | Payer: MEDICARE

## 2021-08-04 ENCOUNTER — Encounter: Admit: 2021-08-04 | Discharge: 2021-08-04 | Payer: MEDICARE

## 2021-08-04 MED ORDER — SPIRONOLACTONE 25 MG PO TAB
25 mg | ORAL_TABLET | Freq: Every day | ORAL | 3 refills | 90.00000 days | Status: AC
Start: 2021-08-04 — End: ?

## 2021-08-05 ENCOUNTER — Encounter: Admit: 2021-08-05 | Discharge: 2021-08-05 | Payer: MEDICARE

## 2021-08-05 NOTE — Telephone Encounter
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 Please follow up. thanks  ----- Message -----  From: Ninfa Meeker  Sent: 08/02/2021   3:51 PM CST  To: Cvm Nurse Ep Team A  Subject: RCP FLuid retention                              Remote transmission presents with optivol rising and thoracic impedance below the reference line suggesting fluid retention at this time since about mid October.  Complete report sent to chart for further detail/review.  Follow up as needed.     Thank you,  Terri/Device Team

## 2021-08-05 NOTE — Telephone Encounter
Spoke with patient by phone.  Patient denies any signs/symptoms of fluid overload. Pt denies shortness of air, edema, or orthopnea.  No changes in medications indicated at this time.

## 2021-08-09 ENCOUNTER — Encounter: Admit: 2021-08-09 | Discharge: 2021-08-09 | Payer: MEDICARE

## 2021-08-09 DIAGNOSIS — M5417 Radiculopathy, lumbosacral region: Secondary | ICD-10-CM

## 2021-08-09 DIAGNOSIS — M5414 Radiculopathy, thoracic region: Secondary | ICD-10-CM

## 2021-08-26 ENCOUNTER — Ambulatory Visit: Admit: 2021-08-26 | Discharge: 2021-08-26 | Payer: MEDICARE

## 2021-08-26 ENCOUNTER — Encounter: Admit: 2021-08-26 | Discharge: 2021-08-26 | Payer: MEDICARE

## 2021-08-26 DIAGNOSIS — G5600 Carpal tunnel syndrome, unspecified upper limb: Secondary | ICD-10-CM

## 2021-08-26 DIAGNOSIS — N529 Male erectile dysfunction, unspecified: Secondary | ICD-10-CM

## 2021-08-26 DIAGNOSIS — K449 Diaphragmatic hernia without obstruction or gangrene: Secondary | ICD-10-CM

## 2021-08-26 DIAGNOSIS — I255 Ischemic cardiomyopathy: Secondary | ICD-10-CM

## 2021-08-26 DIAGNOSIS — I219 Acute myocardial infarction, unspecified: Secondary | ICD-10-CM

## 2021-08-26 DIAGNOSIS — E669 Obesity, unspecified: Secondary | ICD-10-CM

## 2021-08-26 DIAGNOSIS — Z95810 Presence of automatic (implantable) cardiac defibrillator: Secondary | ICD-10-CM

## 2021-08-26 DIAGNOSIS — M1990 Unspecified osteoarthritis, unspecified site: Secondary | ICD-10-CM

## 2021-08-26 DIAGNOSIS — T148XXA Other injury of unspecified body region, initial encounter: Secondary | ICD-10-CM

## 2021-08-26 DIAGNOSIS — I10 Essential (primary) hypertension: Secondary | ICD-10-CM

## 2021-08-26 DIAGNOSIS — E785 Hyperlipidemia, unspecified: Secondary | ICD-10-CM

## 2021-08-26 DIAGNOSIS — I340 Nonrheumatic mitral (valve) insufficiency: Secondary | ICD-10-CM

## 2021-08-26 DIAGNOSIS — M5137 Other intervertebral disc degeneration, lumbosacral region: Secondary | ICD-10-CM

## 2021-08-26 DIAGNOSIS — J302 Other seasonal allergic rhinitis: Secondary | ICD-10-CM

## 2021-08-26 DIAGNOSIS — R053 Chronic cough: Secondary | ICD-10-CM

## 2021-08-26 DIAGNOSIS — J449 Chronic obstructive pulmonary disease, unspecified: Secondary | ICD-10-CM

## 2021-08-26 DIAGNOSIS — M549 Dorsalgia, unspecified: Secondary | ICD-10-CM

## 2021-08-26 DIAGNOSIS — G4733 Obstructive sleep apnea (adult) (pediatric): Secondary | ICD-10-CM

## 2021-08-26 DIAGNOSIS — M5417 Radiculopathy, lumbosacral region: Secondary | ICD-10-CM

## 2021-08-26 DIAGNOSIS — I509 Heart failure, unspecified: Secondary | ICD-10-CM

## 2021-08-26 DIAGNOSIS — R7303 Prediabetes: Secondary | ICD-10-CM

## 2021-08-26 DIAGNOSIS — I2510 Atherosclerotic heart disease of native coronary artery without angina pectoris: Secondary | ICD-10-CM

## 2021-08-26 DIAGNOSIS — I4891 Unspecified atrial fibrillation: Secondary | ICD-10-CM

## 2021-08-26 DIAGNOSIS — K219 Gastro-esophageal reflux disease without esophagitis: Secondary | ICD-10-CM

## 2021-08-26 MED ORDER — DEXAMETHASONE SODIUM PHOS (PF) 10 MG/ML IJ EPIDURAL SOLN
15 mg | Freq: Once | EPIDURAL | 0 refills | Status: CP
Start: 2021-08-26 — End: ?

## 2021-08-26 MED ORDER — LIDOCAINE (PF) 10 MG/ML (1 %) IJ SOLN
2 mL | Freq: Once | INTRAMUSCULAR | 0 refills | Status: CP
Start: 2021-08-26 — End: ?

## 2021-08-26 MED ORDER — TIZANIDINE 2 MG PO TAB
6 mg | ORAL_TABLET | Freq: Every evening | ORAL | 3 refills | Status: AC | PRN
Start: 2021-08-26 — End: ?

## 2021-08-26 MED ORDER — IOHEXOL 240 MG IODINE/ML IV SOLN
1 mL | Freq: Once | EPIDURAL | 0 refills | Status: CP
Start: 2021-08-26 — End: ?

## 2021-08-26 NOTE — Procedures
Attending Surgeon: Evelina Bucy, MD    Anesthesia: Local    Pre-Procedure Diagnosis:   1. Lumbosacral radiculopathy    2. DDD (degenerative disc disease), lumbosacral        Post-Procedure Diagnosis:   1. Lumbosacral radiculopathy    2. DDD (degenerative disc disease), lumbosacral             SNR/TF LMBR/SAC Injection  Procedure: transforaminal epidural    Laterality: left   on 08/26/2021 3:29 PM  Location: Lumbar/Sacral -  L5-S1 and S1-2      Consent:   Consent obtained: written  Consent given by: patient  Risks discussed: allergic reaction, bleeding, bruising, infection, nerve damage, no change or worsening in pain, reaction to medication, seizure, swelling and weakness  Alternatives discussed: alternative treatment, delayed treatment and no treatment  Discussed with patient the purpose of the treatment/procedure, other ways of treating my condition, including no treatment/ procedure and the risks and benefits of the alternatives. Patient has decided to proceed with treatment/procedure.        Universal Protocol:  Relevant documents: relevant documents present and verified  Site marked: the operative site was marked  Patient identity confirmed: Patient identify confirmed verbally with patient.        Time out: Immediately prior to procedure a time out was called to verify the correct patient, procedure, equipment, support staff and site/side marked as required      Procedures Details:   Indications: pain   Prep: chlorhexidine  Patient position: prone  Estimated Blood Loss: minimal  Specimens: none  Number of Levels: 2  Guidance: fluoroscopy  Contrast: Procedure confirmed with contrast under live fluoroscopy.  Needle and Epidural Catheter: quincke  Needle size: 25 G  Injection procedure: Incremental injection and Negative aspiration for blood  Patient tolerance: Patient tolerated the procedure well with no immediate complications. Pressure was applied, and hemostasis was accomplished.  Outcome: Pain relieved  Comments:   LUMBAR/SACRAL TRANSFORAMINAL EPIDURAL STEROID INJECTION PROCEDURE:    1) left L5-S2 transforaminal epidural steroid injection  2) Fluoroscopic needle guidance    REASON FOR PROCEDURE: Lumbar radiculopathy    PHYSICIAN: Evelina Bucy, MD    MEDICATIONS INJECTED: 0.75 mL of Dexamethasone (7.5 mg) + 0.75 ml lidocaine 1% at each level    LOCAL ANESTHETIC INJECTED: 1 mL of 1% lidocaine per site    SEDATION MEDICATIONS: None    ESTIMATED BLOOD LOSS: None    SPECIMENS REMOVED: None    COMPLICATIONS: None    TECHNIQUE: Time-out was taken to identify the correct patient, procedure and side prior to starting the procedure. Lying in a prone position, the patient was prepped and draped in the usual sterile fashion using DuraPrep and a fenestrated drape. The area to be injected was determined under fluoroscopic guidance. Local anesthetic was given by raising a skin wheal and going down to the hub of a 27-gauge 1.25-inch needle.     The 3.5-inch 25-gauge Quincke needle was advanced toward the 6 o?clock position of the pedicle at each above-named nerve root level. The needle was advanced to the final position via a lateral fluoroscopic intermittent image. Omnipaque 240 was injected under live fluoroscopy and showed epidural spread and there was no vascular runoff. After a negative aspiration, the medication was then injected.     The procedure was completed without complications and was tolerated well. The patient was monitored after the procedure. The patient (or responsible party) was given post-procedure and discharge instructions to follow at home. The patient was  discharged in stable condition.             Estimated blood loss: none or minimal  Specimens: none  Patient tolerated the procedure well with no immediate complications. Pressure was applied, and hemostasis was accomplished.

## 2021-08-26 NOTE — Progress Notes
Patient stated he took his xeralto last night, did not stop for the procedure. Dr. Lourdes Sledge stated it was okay he would talk to the patient.

## 2021-08-26 NOTE — Discharge Instructions - Supplementary Instructions
GENERAL POST PROCEDURE INSTRUCTIONS  Physician: _________________________________  Procedure Completed Today:  Joint Injection (hip, knee, shoulder)  Cervical Epidural Steroid Injection  Cervical Transforaminal Steroid Injection  Trigger Point Injection  Caudal Epidural Steroid Injection  Pudendal Nerve Block  Other _____________________ Thoracic Epidural Steroid Injection  Lumbar Epidural Steroid Injection  Lumbar Transforaminal Steroid Injection  Facet Joint Injection  Celiac Nerve Block  Sacrococcygeal  Sacroiliac Joint Injection   Important information following your procedure today:  You may drive today     If you had sedation, you may NOT drive today  Rest at home for the next 6 hours.  You may then begin to resume your normal activities.  DO NOT drive any vehicle, operate any power tools, drink alcohol, make any major decisions, or sign any legal documents for the next 12 hours.  Pain relief may not be immediate. It is possible you may even experience an increase in pain during the first 24-48 hours followed by a gradual decrease of your pain.  Though the procedure is generally safe, and complications are rare, we do ask that you be aware of any of the following:  Any swelling, persistent redness, new bleeding or drainage from the site of the injection.  You should not experience a severe headache.  You should not run a fever over 101oF.  New onset of sharp, severe back and or neck pain.  New onset of upper or lower extremity numbness or weakness.  New difficulty controlling bowel or bladder function after injection.  New shortness of breath.  ** If any of these occur, please call to report this occurrence to a nurse at [phone]. If you are calling after 4:00 p.m. or on weekends or holidays, please call [phone] and ask to have the resident physician on call for the physician paged or go to your local emergency room.  You may experience soreness at the injection site. Ice can be applied at 20-minute intervals for the first 24 hours. The following day you may alternate ice with heat if you are experiencing muscle tightness, otherwise continue with ice. Ice works best at decreasing pain. Avoid application of direct heat, hot showers or hot tubs today.  Avoid strenuous activity today. You many resume your regular activities and exercise tomorrow.  Patients with diabetes may see an elevation in blood sugars for 7-10 days after the injection. It is important to pay close attention to your diet, check your blood sugars daily and report extreme elevations to the physician that manages your diabetes.  Patients taking daily blood thinners can resume their regular dose this evening.  It is important that you take all medications ordered by your pain physician. Taking medications as ordered is an important part of your pain care plan. If you cannot continue the medication plan, please notify the physician.    Possible side effects to steroids that may occur:  Flushing or redness of the face  Irritability  Fluid retention  Change in women's menses  Minor headache    If you are unable to keep your upcoming appointment, please notify the [facility] scheduler at [phone] at least 24 hours in advance. If you have questions for the surgery center, call [facility] at [phone].

## 2021-08-26 NOTE — Progress Notes
I have examined the patient, and there are no significant changes in their condition, from the previous H&P performed on 07/30/21.     Left L5-S2 TFESI          [facility]  Subjective     Chief Complaint:   Chief Complaint   Patient presents with   ? Lower Back - Pain   ? Left Hip - Pain   ? Right Hip - Pain   ? Left Leg - Pain   ? Follow Up       HPI: Johnny [name] is a 75 y.o. 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 who presents for evaluation.    LBP and knee pain    Patient underwent left L5-S2 TFESI on 05/20/2021.  Reports approximately 95% pain relief in his left radicular symptoms.      Patient underwent right knee RFA on 04/20/2021.  Reports approximately 50% relief  pain relief.    Patient underwent 50% on lumbar RFA.  Reports approximately 50%  pain relief.        He saw Dr. Shona Needles who brought up SCS.   Patient wanted to consider RFA, but did not end up proceeding with that there.    LBP  His pain is axial.   No radicular LE pain.   No pain in the buttocks into the groin.   Pain started:  Decades, worse more recently.   Initial inciting injury or event:  No  Numbness/tingling:  No  The pain averages  7-8/10  The pain is described as  achy  The pain is exacerbated by  walking, bending, lifting  The pain is partially alleviated by   laying down, rest  +muscle stiffness/tightness    Patient endorses achiness/heaviness of legs with walking a long distance that improves with rest. Positive Shopping Cart sign.         Johnny [name] denies any recent fevers, chills, infection, antibiotics, bowel or bladder incontinence, saddle anesthesia. +Xarelto for a. fib.      Taking tizanidine, reports mod. pain relief, no SE.        PRIOR MEDICATIONS:   Effective  Acetaminophen (little)  Tizanidine    Ineffective  Gabapentin    Unable to tolerate  NSAID    Never  Lyrica  Ami/Nortriptyline  Cymbalta        PRIOR INTERVENTIONS:  L-spine surgery with hardware L2-3 L3-4 and then later L4-5 and L5-1 next (2 surgeries, most recent in 2008) Right TKA with revision  Effective  Bilateral SIJ (OSH, good benefit)  Left L5-S2 TFESI  right knee RFA  Lumbar RFA    Ineffective  ESI x3 (OSH)      Past Medical History:  Medical History:   Diagnosis Date   ? Atrial fibrillation (HCC) 02/08/2009   ? Back pain    ? CAD (coronary artery disease) 02/08/2009   ? Carpal tunnel syndrome    ? Chronic cough    ? Congestive heart disease (HCC)    ?  COPD (chronic obstructive pulmonary disease) (HCC)    ? Erectile dysfunction, vasculogenic    ? Fracture    ? GERD (gastroesophageal reflux disease)     controlled with elevated HOB, protonix and pepcid    ? Hiatal hernia    ? Hyperlipemia 02/11/2009   ? Hypertension 02/11/2009   ? 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 Mitral regurgitation    ? Myocardial infarction [facility]) 2006   ? Obesity, Class I, 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)    ? OSA on CPAP 02/11/2009   ? Osteoarthritis    ? Pre-diabetes    ? S/P ICD (internal cardiac defibrillator) procedure 02/11/2009   ? Seasonal allergies        Family History:  Family History   Problem Relation Age of Onset   ? Cancer Father    ? Hypertension Mother Social History:  Lives in [place] [id number] (1.25 hours away)    Social History     Socioeconomic History   ? Marital status: Married   Tobacco Use   ? Smoking status: Never Smoker   ? Smokeless tobacco: Never Used   Vaping Use   ? Vaping Use: Never used   Substance and Sexual Activity   ? Alcohol use: No     Alcohol/week: 0.0 standard drinks     Comment: very rarely 4 drinks per year   ? Drug use: No       Allergies:  Allergies   Allergen Reactions   ? Clarithromycin RASH     Allergy recorded in SMS: Biaxin~Reactions: RASH~THRUSH   ? Ketoconazole SEE COMMENTS     Had bloodshot eyes after use, and sore.   ? Lisinopril COUGH       Medications:    Current Outpatient Medications:   ?  acetaminophen (TYLENOL) 325 mg tablet, Take two tablets by mouth every 4 hours as needed for Pain., Disp: 300 tablet, Rfl: 1  ?  albuterol (VENTOLIN HFA, PROAIR HFA) 90 mcg/actuation inhaler, Inhale 2 Puffs by mouth four times daily as needed for Wheezing., Disp: , Rfl:   ?  aspirin 81 mg chewable tablet, Take 1 Tab by mouth daily. WAIT to restart until finished taking lovenox injections. (Patient taking differently: Chew 81 mg by mouth at bedtime daily.), Disp: 90 Tab, Rfl: 3  ?  carbidopa/levodopa (SINEMET) 25/100 mg tablet, Take one tablet by mouth three times daily. Wk : 1/2 tab BID, then 1/2 tab TID x 1 week, then 1 tab TID, Disp: 90 tablet, Rfl: 5  ?  cetirizine (ZYRTEC) 10 mg tablet, Take 10 mg by mouth daily., Disp: , Rfl:   ?  cholecalciferol (Vitamin D3) (VITAMIN D-3) 1,000 units tablet, Take 1,000 Units by mouth twice daily.  , Disp: , Rfl:   ?  dextromethorphan/guaiFENesin (MUCINEX DM) 30/600 mg Tb12, Take 2 tablets by mouth daily as needed., Disp: , Rfl:   ?  docusate (COLACE) 100 mg capsule, Take 200 mg by mouth at bedtime daily. Pt is taking 550 mg daily, Disp: , Rfl:   ?  ezetimibe (ZETIA) 10 mg tablet, Take one tablet by mouth at bedtime daily., Disp: 90 tablet, Rfl: 3  ?  famotidine (PEPCID) 40 mg tablet, Take 40 mg by mouth at bedtime daily., Disp: , Rfl:   ?  fexofenadine(+) (ALLEGRA) 180 mg tablet, Take 180 mg by mouth daily., Disp: , Rfl:   ?  fluticasone-umeclidin-vilanter (TRELEGY ELLIPTA) 100-62.5-25 mcg inhaler, Inhale  1 Dose by mouth into the lungs at bedtime daily., Disp: , Rfl:   ?  furosemide (LASIX) 40 mg tablet, TAKE 2 TABLETS ONCE DAILY, TAKE 3 TABLETS ON Monday/WEDNESDAY/FRIDAY AS DIRECTED BY       CARDIOLOGY, Disp: 270 tablet, Rfl: 3  ?  gabapentin (NEURONTIN) 300 mg capsule, Take one capsule by mouth every 6 hours., Disp: 270 capsule, Rfl: 3  ?  glucosamine su 2KCl-chondroit 500-400 mg tab, Take 1 Tab by mouth twice daily., Disp: , Rfl:   ?  ipratropium bromide (ATROVENT) 42 mcg (0.06 %) nasal spray, Apply 2 sprays to each nostril as directed twice daily as needed., Disp: , Rfl:   ?  losartan (COZAAR) 50 mg tablet, Take one tablet by mouth daily., Disp: 90 tablet, Rfl: 3  ?  metoprolol XL (TOPROL XL) 100 mg extended release tablet, Take one tablet by mouth daily., Disp: 90 tablet, Rfl: 3  ?  mucus clearing device (AEROBIKA OSCILLATING PEP SYSTM MISC), Use  as directed. Use with salt water and inhale twice daily, Disp: , Rfl:   ?  MYRBETRIQ 50 mg tablet, Take 1 tablet by mouth daily. Does not take on days he takes Ditropan, Disp: , Rfl:   ?  OMEGA-3 FATTY ACIDS-FISH OIL PO, Take 1 capsule by mouth twice daily., Disp: , Rfl:   ?  other medication, mupirocin ointment and budesonide solution mixed with warm water Use as nasal irrigation twice daily, Disp: , Rfl:   ?  oxybutynin chloride (DITROPAN) 5 mg tablet, Take 5 mg by mouth twice daily. Does not take on days he takes Myrbetriq, Disp: , Rfl:   ?  pantoprazole DR (PROTONIX) 40 mg tablet, Take 40 mg by mouth twice daily., Disp: , Rfl:   ?  polyethylene glycol 3350 (GLYCOLAX; MIRALAX) 17 gram/dose powder, Take 17 g by mouth daily. (Patient taking differently: Take 17 g by mouth nightly as needed.), Disp: 595 g, Rfl: 3  ?  potassium chloride SR (K-DUR) 20 mEq tablet, Take one tab on Sunday, Tuesday, Thursday, and Saturday.  Take two tabs on Mon, Wed and Friday., Disp: 140 tablet, Rfl: 3  ?  rosuvastatin (CRESTOR) 40 mg tablet, Take one tablet by mouth at bedtime daily., Disp: 90 tablet, Rfl: 3  ?  sildenafil(+) (VIAGRA) 100 mg tablet, Take 1 Tab by mouth as Needed for Erectile dysfunction., Disp: 6 Tab, Rfl: 12  ?  spironolactone (ALDACTONE) 25 mg tablet, Take one tablet by mouth daily. Take with food., Disp: 90 tablet, Rfl: 3  ?  tamsulosin (FLOMAX) 0.4 mg capsule, Take 0.4 mg by mouth daily. Do not crush, chew or open capsules. Take 30 minutes following the same meal each day., Disp: , Rfl:   ?  testosterone cypionate 200 mg/mL kit, Inject 1 mL into the muscle every 14 days., Disp: , Rfl:   ?  tiZANidine (ZANAFLEX) 2 mg tablet, Take three tablets by mouth at bedtime as needed. 1 to 3 tabs qhs prn, Disp: 60 tablet, Rfl: 3  ?  traMADoL (ULTRAM) 50 mg tablet, every 4-6 hours, Disp: , Rfl:   ?  traZODone (DESYREL) 100 mg tablet, bedtime, Disp: , Rfl:   ?  Vacuum Erection Device System kit, Use as directed for sexual activity., Disp: 1 kit, Rfl: 11  ?  XARELTO 20 mg tablet, TAKE 1 TABLET BY MOUTH DAILY - MUST BE WITH EVENING MEAL, Disp: , Rfl:     Current Facility-Administered Medications:   ?  fentaNYL citrate  PF (SUBLIMAZE) injection 50-100 mcg, 50-100 mcg, Intravenous, Q2 MIN PRN, Evelina Bucy, MD, 50 mcg at 04/20/21 1454  ?  fentaNYL citrate PF (SUBLIMAZE) injection 50-100 mcg, 50-100 mcg, Intravenous, Q2 MIN PRN, Lourdes Sledge, Eyden Dobie, MD, 75 mcg at 12/24/20 1605  ?  lidocaine PF 1% (10 mg/mL) injection 0.2 mL, 0.2 mL, Injection, PRN, Lourdes Sledge, Hawke Villalpando, MD  ?  midazolam (VERSED) injection 1-2 mg, 1-2 mg, Intravenous, Q2 MIN PRN, Evelina Bucy, MD, 1 mg at 04/20/21 1454  ?  midazolam (VERSED) injection 1-2 mg, 1-2 mg, Intravenous, Q2 MIN PRN, Lourdes Sledge, Luster Hechler, MD, 2 mg at 12/24/20 1605    Physical examination:   BP 114/70  - Pulse 86  - Temp 36.8 ?C (98.2 ?F)  - Resp 18  - Ht 180.3 cm (5' 11)  - Wt 108 kg (238 lb)  - SpO2 96%  - BMI 33.19 kg/m?   Pain Score: Two    General: The patient is a well-developed, well nourished 75 y.o. male in no acute distress.   HEENT: Head is normocephalic and atraumatic. EOMI bilaterally.   Cardiac: Based on palpation, pulse appears to be regular rate and rhythm.   Pulmonary: The patient has unlabored respirations and bilateral symmetric chest excursion.   Abdomen: Soft, nontender, and nondistended. There is no rebound or guarding.   Extremities: No clubbing, cyanosis, or edema.     Neurologic:   The patient is alert and oriented times 3.   Cranial nerves II through XII are intact without any focal deficits.     Musculoskeletal:   Walks with cane.     L-Spine   There is mild low lumbar paraspinal tenderness. Paraspinal muscle tone is increased.  Facet loading is positive.  ROM with flexion, extension, rotation, and lateral bending is intact.  Strength is equal and adequate bilaterally in the flexors and extensors of the bilateral lower extremities.   SLR is negative bilaterally.         Results for orders placed during the hospital encounter of 08/24/20    MRI L-SPINE WO/W CONTRAST    Addendum 08/25/2020  5:39 PM  Finalized by Ivory Broad, M.D. on 08/24/2020 5:00 PM. Dictated by Delfin Gant, M.D. on 08/24/2020 3:54 PM.Addendum:  Delfin Gant discussed these findings via telephone with Dr. Francee Nodal nurse, Vassie Moment, at 8:34 AM on 08/25/2020.      Approved by Delfin Gant, M.D. on 08/25/2020 8:35 AM    By my electronic signature, I attest that I have personally reviewed the images for this examination and formulated the interpretations and opinions expressed in this report      Finalized by Ivory Broad, M.D. on 08/25/2020 5:36 PM. Dictated by Delfin Gant, M.D. on 08/25/2020 8:33 AM.    Narrative  EXAM: MRI L-SPINE    HISTORY:    , lumbar radiculapathy,    Technique: Multiple sagittal and axial MR sequences were obtained of the lumbar spine with and without MultiHance contrast.    Comparison: CT lumbar spine August 27, 2014    FINDINGS:    There are 5 nonrib-bearing lumbar type vertebral bodies. There is left convexity lumbar spinal curvature. Trace retrolisthesis at L2-L3 and trace anterolisthesis of L5-S1. X-Stop devices are seen at the spinous processes of the lumbar spine. Vertebral body heights are maintained. Multilevel vertebral endplate marrow changes. No suspicious bone marrow replacing lesion. The conus is normal in appearance and position at the T12-L1 level. abnormal enhancing lesion is identified. Bilateral renal atrophy. Small upper pole right renal cyst.  There is an additional 1.3 cm upper pole right renal lesion which demonstrates T2 and T1 hypointensity (series 5, image 8).    T12-L1: No significant central spinal or neuroforaminal stenosis.    L1-L2: Marked disc degeneration, mild circumferential disc bulge, and facet hypertrophy. Mild central spinal stenosis. Moderate left and marked right neural foraminal stenosis.    L2-L3: Trace retrolisthesis, moderate disc degeneration, circumferential disc bulge, facet hypertrophy, and mild malignant flavum thickening. Mild central spinal stenosis. Moderate left and marked right neuroforaminal stenosis.    L3-L4: Moderate disc degeneration, circumferential disc bulge, and facet hypertrophy. Moderate to marked spinal stenosis. Marked bilateral neuroforaminal stenosis.    L4-L5: Mild disc degeneration, circumferential disc bulge with superimposed central disc protrusion, ligamentum flavum thickening, and facet hypertrophy. Moderate to marked central spinal stenosis. Marked bilateral neural foraminal stenosis.    L5-S1: Mild disc degeneration, circumferential disc bulge, and facet hypertrophy. Moderate central spinal stenosis. Marked bilateral neural foraminal stenosis.    Impression  1.  Multilevel degenerative central spinal stenosis, greatest of moderate to marked degree at L3-L4, L4-L5, and L5-S1.  2.  Multilevel degenerative neuroforaminal stenosis, greatest of marked degree on the right at L1-L2 and L2-L3 as well as bilaterally at L3-L4, L4-5, and L5-S1.  3.  Left convexity lumbar spinal curvature.  4.  Trace retrolisthesis of L2-L3 and trace anterolisthesis of L5-S1.  5.  T2 and T1 hypointense peripheral right renal lesion. MRI abdomen (renal mass protocol) is recommended for further evaluation.    By my electronic signature, I attest that I have personally reviewed the images for this examination and formulated the interpretations and opinions expressed in this report          Last Cr and LFT's:  Creatinine   Date Value Ref Range Status   03/31/2021 1.30 (H) 0.4 - 1.24 MG/DL Final     AST (SGOT)   Date Value Ref Range Status   11/22/2018 25 7 - 40 U/L Final     ALT (SGPT)   Date Value Ref Range Status   11/22/2018 24 7 - 56 U/L Final     Alk Phosphatase   Date Value Ref Range Status   11/22/2018 51 25 - 110 U/L Final     Total Bilirubin   Date Value Ref Range Status   11/22/2018 0.8 0.3 - 1.2 MG/DL Final          Assessment:    Soul Companion is a 75 y.o. 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 who presents for evaluation of pain.    The pain complaints are most likely due to:    1. Lumbosacral radiculopathy  [place] AMB SPINE INJECT SNRB/TFESI LUMBAR/SACRAL   2. DDD (degenerative disc disease), lumbosacral     3. Chronic pain of right knee     4. Facet arthropathy         Patient has had an adequate trial of > 12 months of rest, exercise, multimodal treatment, and the passage of time without improvement of symptoms.  The pain has significant impact on the daily quality of life.     Plan:  1.Discussed care options with patient. Will start process for SCS trial.  Thoracic MRI, [facility] point handout.  2. Could consider repeat Lumbar RFA vs Repeat right knee RFA  3. Repeat left L5-S2 TFESI  This patient had at least 50% pain relief for at least 3 months with the last epidural injection. The pain score was 9/10 prior to the injection and 3/10 following the injection.    4. Continue tizanidine as prescribed  5. Encouraged to continue at home PT program.  6. Follow up after injection      Risks/benefits of all pharmacologic and interventional treatments discussed and questions answered.

## 2021-08-27 ENCOUNTER — Encounter: Admit: 2021-08-27 | Discharge: 2021-08-27 | Payer: MEDICARE

## 2021-09-03 ENCOUNTER — Encounter: Admit: 2021-09-03 | Discharge: 2021-09-03 | Payer: MEDICARE

## 2021-09-03 NOTE — Telephone Encounter
DAYS USED 29 / 30     DAYS USED 4+ HOURS 27/30     AVG DAILY USAGE (DAYS USED) 7 HRS 37 MINS    AVG AHI 0.4    AVG CAI 0.0     MEDIAN PRESSURE LEAK 2.3     MAXIMUM PRESSURE LEAK 17.9              DEVICE NA    MODE NA    MIN PRESSURE (cmH2O) NA    MAX PRESSURE (cmH2O) NA    RESPONSE NA    EPR NA    EPR LEVEL (cmH2O) NA    RAMP ENABLE NA     RAMP TIME (min) NA    START PRESSURE (cmH2O) NA     CLIMATE CONTROL NA     HUMIDIFIER LEVEL NA

## 2021-09-08 ENCOUNTER — Encounter: Admit: 2021-09-08 | Discharge: 2021-09-08 | Payer: MEDICARE

## 2021-09-08 DIAGNOSIS — M25511 Pain in right shoulder: Secondary | ICD-10-CM

## 2021-09-09 ENCOUNTER — Encounter: Admit: 2021-09-09 | Discharge: 2021-09-09 | Payer: MEDICARE

## 2021-09-09 ENCOUNTER — Ambulatory Visit: Admit: 2021-09-09 | Discharge: 2021-09-10 | Payer: MEDICARE

## 2021-09-09 DIAGNOSIS — G4733 Obstructive sleep apnea (adult) (pediatric): Secondary | ICD-10-CM

## 2021-09-09 DIAGNOSIS — G4752 REM sleep behavior disorder: Secondary | ICD-10-CM

## 2021-09-09 NOTE — Progress Notes
Telehealth Visit Note    Date of Service: 09/09/2021    Subjective:      Obtained patient's verbal consent to treat them and their agreement to Johnny [name] financial policy and NPP via this telehealth visit during the Johnny [name]       Johnny [name] is a 75 y.o. male.    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      Interval History   Split night sleep study 04/16/21:  IMPRESSION:   ? 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   ? Sleep architecture showed decreased sleep efficiency. Patient spent majority if time in N2. Patient achieve REM sleep after CPAP.    ? CPAP therapy at a pressure of 9 cmH2O appears effective in treating this patient?s sleep disordered breathing.  ? Elevated frequency of periodic limb movements (PLMS).   ? Excessive sustained muscle activity noted during REM consistent with REM without Atonia.   *RWA- there were not large body movements noted.    He has been doing well with his new CPAP machine. Works well.   He turned ramp feature off because he didn't need it.   No issues with mask.  Using the humidifier. Ok. Doesn't like the design of the water chamber.     Johnny [name] started melatonin SR, currently 6 mg. Wife notices less dream enactment behavior. She notes some restlessness, but no jerky movements. No large movements since starting the new machine. No bodily injuries. Talks some, no shouting.     Significant improvement in leg kicks noted by wife. Rare RLS, doesn't keep him from falling asleep.     Bedtime: MN to 1 AM  Sleep onset latency: Instantaneous  Awakenings: Rarely for nocturia.   Wake time: 8 AM    Naps: Not typically    He follows with Cardiology.     Saw Dr. Raleigh [name] in August, was started on Sinemet. Tremor improved and walking much better. Has follow up with Movement Disorders Specialist scheduled in January.    He was diagnosed with seronegative myasthenia gravis by Neuro-ophthalmologist, Dr. Valarie [name] and was started on pyridostigmine. Vision improved. AChR binding and modulating ABs negative. MuSK and LRP4 neg.     The patient's Epworth Sleepiness Scale Score is 9/24. Denies drowsy driving.   STOPBANG Score: 4  If score > 3 there is a high probability that they have OSA.  Depression Screening was performed on Johnny [name] in clinic today. Based on the score of 0, no follow up action or recommendations are necessary at this time.     Review of Systems   Constitutional: Positive for fatigue.   Psychiatric/Behavioral: Positive for sleep disturbance.       Objective:         ? acetaminophen (TYLENOL) 325 mg tablet Take two tablets by mouth every 4 hours as needed for Pain.   ? albuterol (VENTOLIN HFA, PROAIR HFA) 90 mcg/actuation inhaler Inhale 2 Puffs by mouth four times daily as needed for Wheezing.   ? aspirin 81 mg chewable tablet Take 1 Tab by mouth daily. WAIT to restart until finished taking lovenox injections. (Patient taking differently: Chew 81 mg by mouth at bedtime daily.)   ? carbidopa/levodopa (SINEMET) 25/100 mg tablet Take one tablet by mouth three times daily. Wk : 1/2 tab BID, then 1/2 tab TID x 1 week, then 1 tab TID   ? cetirizine (ZYRTEC) 10 mg tablet Take 10  mg by mouth daily.   ? cholecalciferol (Vitamin D3) (VITAMIN D-3) 1,000 units tablet Take 1,000 Units by mouth twice daily.     ? dextromethorphan/guaiFENesin (MUCINEX DM) 30/600 mg Tb12 Take 2 tablets by mouth daily as needed.   ? docusate (COLACE) 100 mg capsule Take 200 mg by mouth at bedtime daily. Pt is taking 550 mg daily   ? ezetimibe (ZETIA) 10 mg tablet Take one tablet by mouth at bedtime daily.   ? famotidine (PEPCID) 40 mg tablet Take 40 mg by mouth at bedtime daily.   ? fexofenadine(+) (ALLEGRA) 180 mg tablet Take 180 mg by mouth daily.   ? fluticasone-umeclidin-vilanter (TRELEGY ELLIPTA) 100-62.5-25 mcg inhaler Inhale 1 Dose by mouth into the lungs at bedtime daily.   ? furosemide (LASIX) 40 mg tablet TAKE 2 TABLETS ONCE DAILY, TAKE 3 TABLETS ON Monday/WEDNESDAY/FRIDAY AS DIRECTED BY       CARDIOLOGY   ? gabapentin (NEURONTIN) 300 mg capsule Take one capsule by mouth every 6 hours.   ? glucosamine su 2KCl-chondroit 500-400 mg tab Take 1 Tab by mouth twice daily.   ? ipratropium bromide (ATROVENT) 42 mcg (0.06 %) nasal spray Apply 2 sprays to each nostril as directed twice daily as needed.   ? losartan (COZAAR) 50 mg tablet Take one tablet by mouth daily.   ? metoprolol XL (TOPROL XL) 100 mg extended release tablet Take one tablet by mouth daily.   ? mucus clearing device (AEROBIKA OSCILLATING PEP SYSTM MISC) Use  as directed. Use with salt water and inhale twice daily   ? MYRBETRIQ 50 mg tablet Take 1 tablet by mouth daily. Does not take on days he takes Ditropan   ? OMEGA-3 FATTY ACIDS-FISH OIL PO Take 1 capsule by mouth twice daily.   ? other medication mupirocin ointment and budesonide solution mixed with warm water  Use as nasal irrigation twice daily   ? oxybutynin chloride (DITROPAN) 5 mg tablet Take 5 mg by mouth twice daily. Does not take on days he takes Myrbetriq   ? pantoprazole DR (PROTONIX) 40 mg tablet Take 40 mg by mouth twice daily.   ? polyethylene glycol 3350 (GLYCOLAX; MIRALAX) 17 gram/dose powder Take 17 g by mouth daily. (Patient taking differently: Take 17 g by mouth nightly as needed.)   ? potassium chloride SR (K-DUR) 20 mEq tablet Take one tab on Sunday, Tuesday, Thursday, and Saturday.  Take two tabs on Mon, Wed and Friday.   ? rosuvastatin (CRESTOR) 40 mg tablet Take one tablet by mouth at bedtime daily.   ? sildenafil(+) (VIAGRA) 100 mg tablet Take 1 Tab by mouth as Needed for Erectile dysfunction.   ? spironolactone (ALDACTONE) 25 mg tablet Take one tablet by mouth daily. Take with food.   ? tamsulosin (FLOMAX) 0.4 mg capsule Take 0.4 mg by mouth daily. Do not crush, chew or open capsules. Take 30 minutes following the same meal each day.   ? testosterone cypionate 200 mg/mL kit Inject 1 mL into the muscle every 14 days.   ? tiZANidine (ZANAFLEX) 2 mg tablet Take three tablets by mouth at bedtime as needed. 1 to 3 tabs qhs prn   ? traMADoL (ULTRAM) 50 mg tablet every 4-6 hours   ? traZODone (DESYREL) 100 mg tablet bedtime   ? Vacuum Erection Device System kit Use as directed for sexual activity.   ? XARELTO 20 mg tablet TAKE 1 TABLET BY MOUTH DAILY - MUST BE WITH EVENING MEAL  Telehealth Patient Reported Vitals     Row Name 09/09/21 1534                BP: --  couldnt obtain        Weight: 108 kg (238 lb)        Pain Score: Zero                  Telehealth Body Mass Index: 0 at 09/09/2021  6:04 PM    Physical Exam  Alert, no acute distress. Pleasant and cooperative with history and evaluation.          Assessment and Plan:    Problem   Osa (Obstructive Sleep Apnea)    CPAP titration (03/13/20);  6 CM H20 recommended  PLMI 4.4, PLMAI 0.6  Increased muscle tone noted during some epochs of REM    Split night sleep study (04/16/21):  AHI 11.6 (CAI 0), SpO2 min 83%, 6.3 min less than or equal to 88%  CPAP 9 cm H20  PLMI Diagnostic portion 92.3 , PLMI Treatment Portion 18/hr without arousals       Rem Sleep Behavior Disorder       OSA (obstructive sleep apnea)  OSA on CPAP.   Patient is experiencing symptomatic benefit from CPAP. He has excellent compliance and OSA is controlled on current CPAP settings.    Various aspects of CPAP usage and compliance were discussed with patient. He was advised to use CPAP all night, every night.                    REM sleep behavior disorder  Increased muscle tone during REM sleep was noted during split night sleep study, in the absence of significant respiratory events. There were not large body movements observed during the study.   I counseled the patient extensively on maintaining a safe sleep environment for the patient and bed partner.   Patient and wife note improvement in symptoms. He reports benefit from melatonin SR 6 mg at bedtime. He will continue on this regimen.  Johnny [name] was advised to update the clinic if he has recurrent dream enactment episodes.                The risks associated with driving or operating heavy machinery while sleepy were discussed with the patient, who understands and agrees to take measures to eliminate these risks.       RTC  6 months in person, sooner if needed.  A CPAP download will be obtained to evaluate compliance and efficacy of treatment.     Total Time Today was over 30 minutes in the following activities: Preparing to see the patient, Obtaining and/or reviewing separately obtained history, Performing a medically appropriate examination and/or evaluation, Counseling and educating the patient/family/caregiver, Documenting clinical information in the electronic or other health record and Independently interpreting results (not separately reported) and communicating results to the patient/family/caregiver

## 2021-09-10 NOTE — Assessment & Plan Note
Increased muscle tone during REM sleep was noted during split night sleep study, in the absence of significant respiratory events. There were not large body movements observed during the study.   I counseled the patient extensively on maintaining a safe sleep environment for the patient and bed partner.   Patient and wife note improvement in symptoms. He reports benefit from melatonin SR 6 mg at bedtime. He will continue on this regimen.  Johnny [name] was advised to update the clinic if he has recurrent dream enactment episodes.

## 2021-09-14 ENCOUNTER — Encounter: Admit: 2021-09-14 | Discharge: 2021-09-14 | Payer: MEDICARE

## 2021-09-17 ENCOUNTER — Encounter: Admit: 2021-09-17 | Discharge: 2021-09-17 | Payer: MEDICARE

## 2021-09-23 NOTE — Patient Instructions
General Instructions:  How to reach me: Please send a MyChart message to the Spine Center or leave a voicemail for my nurse Rebecca at [phone].  Scheduling: Our scheduling phone number is [phone].  How to get a medication refill: Five business days before refill needed, please use the MyChart Refill request or contact your pharmacy directly to request medication refills.   How to receive your test results: If you have signed up for MyChart, you will receive your test results and messages from me this way. Otherwise, you will get a phone call or letter. If you are expecting results and have not heard from my office within 2 weeks of your testing, please send a MyChart message or call my office.  Support for many chronic illnesses is available through [facility]: [email or web address] or [phone].  For questions on nights, weekends or holidays, call the operator at [phone], and ask for the doctor on call for Anesthesia Pain Management.

## 2021-09-24 ENCOUNTER — Encounter: Admit: 2021-09-24 | Discharge: 2021-09-24 | Payer: MEDICARE

## 2021-09-24 ENCOUNTER — Ambulatory Visit: Admit: 2021-09-24 | Discharge: 2021-09-24 | Payer: MEDICARE

## 2021-09-24 DIAGNOSIS — E669 Obesity, unspecified: Secondary | ICD-10-CM

## 2021-09-24 DIAGNOSIS — E785 Hyperlipidemia, unspecified: Secondary | ICD-10-CM

## 2021-09-24 DIAGNOSIS — M1990 Unspecified osteoarthritis, unspecified site: Secondary | ICD-10-CM

## 2021-09-24 DIAGNOSIS — T148XXA Other injury of unspecified body region, initial encounter: Secondary | ICD-10-CM

## 2021-09-24 DIAGNOSIS — G4733 Obstructive sleep apnea (adult) (pediatric): Secondary | ICD-10-CM

## 2021-09-24 DIAGNOSIS — R053 Chronic cough: Secondary | ICD-10-CM

## 2021-09-24 DIAGNOSIS — R7303 Prediabetes: Secondary | ICD-10-CM

## 2021-09-24 DIAGNOSIS — N529 Male erectile dysfunction, unspecified: Secondary | ICD-10-CM

## 2021-09-24 DIAGNOSIS — J302 Other seasonal allergic rhinitis: Secondary | ICD-10-CM

## 2021-09-24 DIAGNOSIS — K449 Diaphragmatic hernia without obstruction or gangrene: Secondary | ICD-10-CM

## 2021-09-24 DIAGNOSIS — G5600 Carpal tunnel syndrome, unspecified upper limb: Secondary | ICD-10-CM

## 2021-09-24 DIAGNOSIS — I10 Essential (primary) hypertension: Secondary | ICD-10-CM

## 2021-09-24 DIAGNOSIS — M549 Dorsalgia, unspecified: Secondary | ICD-10-CM

## 2021-09-24 DIAGNOSIS — J449 Chronic obstructive pulmonary disease, unspecified: Secondary | ICD-10-CM

## 2021-09-24 DIAGNOSIS — I509 Heart failure, unspecified: Secondary | ICD-10-CM

## 2021-09-24 DIAGNOSIS — I340 Nonrheumatic mitral (valve) insufficiency: Secondary | ICD-10-CM

## 2021-09-24 DIAGNOSIS — I2510 Atherosclerotic heart disease of native coronary artery without angina pectoris: Secondary | ICD-10-CM

## 2021-09-24 DIAGNOSIS — I255 Ischemic cardiomyopathy: Secondary | ICD-10-CM

## 2021-09-24 DIAGNOSIS — K219 Gastro-esophageal reflux disease without esophagitis: Secondary | ICD-10-CM

## 2021-09-24 DIAGNOSIS — Z95810 Presence of automatic (implantable) cardiac defibrillator: Secondary | ICD-10-CM

## 2021-09-24 DIAGNOSIS — I219 Acute myocardial infarction, unspecified: Secondary | ICD-10-CM

## 2021-09-24 DIAGNOSIS — I4891 Unspecified atrial fibrillation: Secondary | ICD-10-CM

## 2021-09-24 MED ORDER — TIZANIDINE 2 MG PO TAB
6 mg | ORAL_TABLET | Freq: Every evening | ORAL | 3 refills | Status: AC | PRN
Start: 2021-09-24 — End: ?

## 2021-09-28 ENCOUNTER — Encounter: Admit: 2021-09-28 | Discharge: 2021-09-28 | Payer: MEDICARE

## 2021-09-28 ENCOUNTER — Ambulatory Visit: Admit: 2021-09-28 | Discharge: 2021-09-28 | Payer: MEDICARE

## 2021-09-28 DIAGNOSIS — M25511 Pain in right shoulder: Secondary | ICD-10-CM

## 2021-09-28 DIAGNOSIS — Z95810 Presence of automatic (implantable) cardiac defibrillator: Secondary | ICD-10-CM

## 2021-09-28 DIAGNOSIS — M5414 Radiculopathy, thoracic region: Secondary | ICD-10-CM

## 2021-09-28 NOTE — Progress Notes
[facility]  MRI Recommendations for Johnny [name] Conditional MRI     Device Information:       Malikiah Debarr MRN: [id number]   Date of Birth: 01-17-1946 Anticipated Date of MRI: 09/28/21     MRI SYSTEM CONDITIONS    [name] has a complete MRI ready system with MR Conditional generator and lead(s) Leads implanted > 6 weeks prior to MRI:                         [x]   Yes    []  No      Device FDA approved to undergo MRI Scan at:  [x]  1.5T    [x]  3T     DEVICE IMAGING   If [name]'s device is not followed by [facility], images have been obtained at a [facility] facility and reviewed within the past six months (must have an image obtained since the last device revision).   Chest X-ray/ CT has been reviewed since implantation (or revision of the leads) of the device/ lead(s): by Evaristo Bury [x]   Yes   []  No      Generator is implanted in the pectoral region: Left [x]   Yes / Leadless []  No      The system is absent of lead extenders, lead adapters, broken leads, permanent epicardial leads, and abandoned leads: [x]   Yes  [x]  No      DEVICE EVALUATION   If [name]'s device is not followed by [facility], a device check has been obtained at a [facility] facility and reviewed within the past six months.     []   [name] meets eligibility for SmartSync SureScan.  This section does NOT need completed; the application will collect relevant device data at the time of the MRI.    []  [name] has an S-ICD. This section does NOT need completed.     Generator has sufficient battery (generator is not at end-of-life/ ERI/ EOS/ RRT):  [x]   Yes   []  No      [name] is historically NOT dependent (as defined by no escape greater than 40 bpm):  [x]   Correct   []  Incorrect   Lead impedance measurements are within the programmed lead impedance limits (For atrial and ventricular pacing leads: lead impedance 200-2,000 ohms.  For defibrillation leads: lead impedance 20-200 ohms): [x]   Yes   []  No      [name] tolerates a pacing output of 5.0 V at a pulse width of 1.0 ms [x]   Yes []  Assess at time of MRI   Pacing Percentages:   [x]  Atrial Pacing:  18%              [x]  Ventricular Pacing:  0.2%            Capture threshold < 3.5 V at a pulse width of 0.5 ms for RA and RV leads for devices that will be programmed to an asynchronous pacing mode [x]   Yes    []  No      Preliminary recommendations for PRE-MRI SCAN DEVICE SETTINGS* Sure Scan ON during MRI> Sure Scan Off  Post MRI  (Full assessment of programming to be confirmed by device staff or representative at time of MRI)   Pacing rate selected to avoid competitive pacing (recommendations based on historical data). Tachy detections will be turned off for MRI via MRI mode programmed setting:    [x]  Pacing Mode: ODO        [x]   Therapies Off    []  Utilize Medtronic SmartSync application to place [name] in SureScan mode**                     Images Reviewed and Recommendations Made in Collaboration with:  Seth Bake, APRN Date: 09/28/21     Assessment completed by: Sonia Baller, RN    *The [facility] Device Team has completed this assessment based on current available data in the [name]'s medical record.  If changes/ concerns are noted at the time of the MRI, the [facility] Device Staff/ Vendor Representative have the discretion to use best clinical judgement to ensure the safety of the [name].      **Medtronic SmartSync application has safety parameters in place.  If progression to positive SureScan meets lockout criteria, please contact the [facility] Device Team or [employer] representative.  Please send report of final programming parameters sent to [facility] device team (Email: [email or web address] Fax: [phone])      [place]    [email or web address]  [facility]     [email or web address]    [place]     [phone]  [email or web address]

## 2021-09-28 NOTE — Progress Notes
Procedure: MRI T-spine and Upper ext jnt right no contrast    Pacemaker Type: Conditional    Order verified: Yes    Allergies reviewed: Yes    Patient history reviewed: Yes      Conditional Pacemaker: Minimum requirements, vital signs needed pre/post scan (See Doc Flowsheets for further).      1254 Pt placed on MRI safe cardiac, BP and O2 monitors.   ?   1254 Pre procedure Vital Signs (See Doc Flowsheets for further).    1255 MRI Device Settings placed by [facility]. RN.    Program Mode: ODO    Pacing Mode: Off    1438 Device returned back to prior MRI device settings post-scan by [facility] RN.    Post procedure Vital Signs (See Doc Flowsheets for further).

## 2021-10-05 ENCOUNTER — Encounter: Admit: 2021-10-05 | Discharge: 2021-10-05 | Payer: MEDICARE

## 2021-10-05 ENCOUNTER — Ambulatory Visit: Admit: 2021-10-05 | Discharge: 2021-10-05 | Payer: MEDICARE

## 2021-10-05 DIAGNOSIS — R52 Pain, unspecified: Secondary | ICD-10-CM

## 2021-10-05 DIAGNOSIS — M5414 Radiculopathy, thoracic region: Secondary | ICD-10-CM

## 2021-10-05 DIAGNOSIS — M5417 Radiculopathy, lumbosacral region: Secondary | ICD-10-CM

## 2021-10-05 DIAGNOSIS — M5137 Other intervertebral disc degeneration, lumbosacral region: Secondary | ICD-10-CM

## 2021-10-05 MED ORDER — FENTANYL CITRATE (PF) 50 MCG/ML IJ SOLN
50-100 ug | INTRAVENOUS | 0 refills | Status: AC | PRN
Start: 2021-10-05 — End: ?

## 2021-10-05 MED ORDER — CEFAZOLIN INJ 1GM IVP
2 g | Freq: Once | INTRAVENOUS | 0 refills | Status: CP
Start: 2021-10-05 — End: ?

## 2021-10-05 MED ORDER — MIDAZOLAM 1 MG/ML IJ SOLN
1-2 mg | INTRAVENOUS | 0 refills | Status: AC | PRN
Start: 2021-10-05 — End: ?

## 2021-10-05 MED ORDER — LIDOCAINE (PF) 20 MG/ML (2 %) IJ SOLN
8 mL | Freq: Once | INTRAMUSCULAR | 0 refills | Status: CP
Start: 2021-10-05 — End: ?

## 2021-10-05 NOTE — Discharge Instructions - Supplementary Instructions
SPINAL CORD STIMULATOR: TRIAL    DISCHARGE INSTRUCTIONS    You may NOT drive today.  Though the procedure is generally safe, and complications are rare, we do ask that you be aware of any of the following:  Any swelling, persistent redness, new bleeding, or drainage from the site of the incision.  You should not experience a severe headache.  You should not run a fever over 101degrees.  New onset of sharp, severe back and or neck pain.  New onset of upper or lower extremity numbness or weakness.  New difficulty controlling bowel or bladder function.  New shortness of breath.  Your pain coverage changes or if you start sensing a shooting pain in an extremity.    ** If any of these occur, please call to report this occurrence to a nurse at [phone]. If you are calling after 4:00 p.m. or on weekends or holidays, please call [phone] and ask to have the resident physician on call for the physician paged or go to your local emergency room.  Do NOT shower. The bandage cannot get wet. You will have to sponge bathe only.  Do NOT remove the bandage.  If it becomes loose it can be reinforced with tape.  Keep dressing dry.  Avoid bending, twisting, lifting arms over head or heavy lifting during the trial.  As much as possible go about your daily activities to determine if the stimulator will significantly reduce your pain and increase your ability to function.  If you are on a daily extended-release pain medication, do not stop taking it.  It is possible that you will be able to greatly reduce the amount of the short acting pain medication from what you have normally needed.  IV site: If a lump or redness occurs apply a moist compress for 10 minutes, 4 times a day for 2-3 days.  If this persists greater than 3 days notify your surgeon.  Anesthetic may make you drowsy and slow to react for up to 12 hours, therefore, do not drive, operate machinery, sign legal documents, or work for the next 12 hours.    A responsible adult should be with you for 12 hours.    Follow up appointment in one week. If in the event you are unable to keep your appointment, please notify the scheduler 24 hours in advance at [phone].During your follow up appointment, you will discuss with the doctor the effectiveness of the stimulator during the trial period to determine if a permanent implantation is the direction for you.  Please bring your stimulator supplies with you to the appointment.  You may shower the day after the lead is removed in the office.    If you have questions for the surgery center, call [facility] at [phone].

## 2021-10-05 NOTE — Procedures
INTERVENTIONAL PAIN MANAGEMENT PROCEDURE REPORT     Spinal Cord Stimulator Trial     Date of Service: 10/05/2021     Procedure Title(s):    1. Percutaneous placement of trial octad spinal cord stimulator leads x 2    2. Intraoperative fluoroscopy    Attending Surgeon: Evelina Bucy, MD    Pre-Procedure Diagnosis:   1. Lumbosacral radiculopathy    2. DDD (degenerative disc disease), lumbosacral    3. Thoracic radiculopathy        Post-Procedure Diagnosis:   1. Lumbosacral radiculopathy    2. DDD (degenerative disc disease), lumbosacral    3. Thoracic radiculopathy        Anesthesia: Local                       Anxiolysis Yes           Procedural Sedation No    Indications: Johnny [name] is a 76 y.o. male . The patient has failed more conservative measures such as physical therapy, medication management and is currently a non-operative candidate. The patient's history and physical exam were reviewed. The risks, benefits and alternatives to the procedure were discussed, and all questions were answered to the patient's satisfaction. The patient agreed to proceed, and written informed consent was obtained.     Procedure in Detail: IV was started? Yes    The patient was brought into the procedure room and placed in the prone position on the fluoroscopy table. Standard monitors were placed, and vital signs were observed throughout the procedure. An intravenous infusion of antibiotic (Ancef) was started and completed prior to the start of the procedure. The area of the thoracolumbar spine was prepped with Hibaclens and draped in a sterile manner.     The T11/T12 interspace was identified. The skin and subcutaneous tissues in the area were anesthetized with 1% lidocaine. A 14-gauge Tuohy epidural needle was advanced with a paramedian approach from the left using a loss of resistance technique with a glass syringe and air to identify entrance into the epidural space. Once a good loss of resistance was obtained, negative aspiration was confirmed. A percutaneous stimulator lead was advanced under fluoroscopic guidance until the 0-position contact was at the T7-8 interspace. A second lead was placed using the same technique from the T11/12 interspace on the left until the 0-contact was at the top of T9. The leads were connected to an external trial programmer using a sterile connector. Several combinations of lead configuration, frequency, amplitude and pulse width were used until comfortable, appropriate coverage of the patient's pain area was obtained.     The needles were then carefully removed and the leads were secured to the skin using sterile dressing and tegaderms. The connector was also wrapped in gauze and secured to the patient.    Disposition: The patient tolerated the procedure well, and there were no apparent complications. Vital signs remained stable througout the procedure. The patient was taken to the recovery area where discharge instructions for the procedure were given. The patient will return to the clinic in one week to discuss the results of this trial.    Estimated Blood Loss: minimal    Specimens: none    Complications: none

## 2021-10-05 NOTE — Progress Notes
I have examined the patient, and there are no significant changes in their condition, from the previous H&P performed on 09/24/21.     SCS Trial with 2 thoracic leads for LBP        [facility] Inter[facility]  Subjective     Chief Complaint:   Chief Complaint   Patient presents with   ? Lower Back - Pain   ? Follow Up       HPI: Johnny [name] is a 76 y.o. 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 who presents for evaluation.    LBP and knee pain    Patient underwent left L5-S2 TFESI on 08/26/2021.  Reports approximately 50%  pain relief, but was transient.  Most relief was in left knee    He is scheduled to undergo SCS trial soon.     LBP  His pain is axial.   Does have LLE radicular symptoms  No pain in the buttocks into the groin.   Pain started:  Decades, worse more recently.   Initial inciting injury or event:  No  Numbness/tingling:  No  The pain averages  7-8/10  The pain is described as  achy  The pain is exacerbated by  walking, bending, lifting  The pain is partially alleviated by   laying down, rest  +muscle stiffness/tightness    Patient endorses achiness/heaviness of legs with walking a long distance that improves with rest. Positive Shopping Cart sign.         Johnny [name] denies any recent fevers, chills, infection, antibiotics, bowel or bladder incontinence, saddle anesthesia. +Xarelto for a. fib.  Recent diagnosis of parkinsons    Taking tizanidine, reports mod. pain relief, no SE.        PRIOR MEDICATIONS:   Effective  Acetaminophen (little)  Tizanidine    Ineffective  Gabapentin    Unable to tolerate  NSAID    Never  Lyrica  Ami/Nortriptyline  Cymbalta        PRIOR INTERVENTIONS:  L-spine surgery with hardware L2-3 L3-4 and then later L4-5 and L5-1 next (2 surgeries, most recent in 2008) Right TKA with revision  Effective  Bilateral SIJ (OSH, good benefit)  Left L5-S2 TFESI  right knee RFA  Lumbar RFA      Ineffective  ESI x3 (OSH)      Past Medical History:  Medical History:   Diagnosis Date   ? Atrial fibrillation (HCC) 02/08/2009   ? Back pain    ? CAD (coronary artery disease) 02/08/2009   ? Carpal tunnel syndrome    ? Chronic cough    ? Congestive heart disease (HCC)    ? COPD (chronic obstructive pulmonary disease) (HCC)    ? Erectile dysfunction, vasculogenic    ? Fracture    ? GERD (gastroesophageal reflux disease)     controlled with elevated HOB, protonix and pepcid    ? Hiatal hernia    ? Hyperlipemia 02/11/2009   ? Hypertension 02/11/2009   ? ICD (  implantable cardiac defibrillator) in place 01/06/2006   ? 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 Mitral regurgitation    ? Myocardial infarction [facility]) 2006   ? Obesity, Class I, 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)    ? OSA on CPAP 02/11/2009   ? Osteoarthritis    ? Pre-diabetes    ? S/P ICD (internal cardiac defibrillator) procedure 02/11/2009   ? Seasonal allergies        Family History:  Family History   Problem Relation Age of Onset   ? Cancer Father    ? Hypertension Mother        Social History:  Lives in [place] [id number] (1.25 hours away)    Social History     Socioeconomic History   ? Marital status: Married   Tobacco Use   ? Smoking status: Never   ? Smokeless tobacco: Never   Vaping Use   ? Vaping Use: Never used Substance and Sexual Activity   ? Alcohol use: No     Alcohol/week: 0.0 standard drinks     Comment: very rarely 4 drinks per year   ? Drug use: No       Allergies:  Allergies   Allergen Reactions   ? Clarithromycin RASH     Allergy recorded in SMS: Biaxin~Reactions: RASH~THRUSH   ? Ketoconazole SEE COMMENTS     Had bloodshot eyes after use, and sore.   ? Lisinopril COUGH       Medications:    Current Outpatient Medications:   ?  acetaminophen (TYLENOL) 325 mg tablet, Take two tablets by mouth every 4 hours as needed for Pain., Disp: 300 tablet, Rfl: 1  ?  albuterol (VENTOLIN HFA, PROAIR HFA) 90 mcg/actuation inhaler, Inhale 2 Puffs by mouth four times daily as needed for Wheezing., Disp: , Rfl:   ?  aspirin 81 mg chewable tablet, Take 1 Tab by mouth daily. WAIT to restart until finished taking lovenox injections. (Patient taking differently: Chew 81 mg by mouth at bedtime daily.), Disp: 90 Tab, Rfl: 3  ?  carbidopa/levodopa (SINEMET) 25/100 mg tablet, Take one tablet by mouth three times daily. Wk : 1/2 tab BID, then 1/2 tab TID x 1 week, then 1 tab TID, Disp: 90 tablet, Rfl: 5  ?  cetirizine (ZYRTEC) 10 mg tablet, Take 10 mg by mouth daily., Disp: , Rfl:   ?  cholecalciferol (Vitamin D3) (VITAMIN D-3) 1,000 units tablet, Take 1,000 Units by mouth twice daily.  , Disp: , Rfl:   ?  dextromethorphan/guaiFENesin (MUCINEX DM) 30/600 mg Tb12, Take 2 tablets by mouth daily as needed., Disp: , Rfl:   ?  docusate (COLACE) 100 mg capsule, Take 200 mg by mouth at bedtime daily. Pt is taking 550 mg daily, Disp: , Rfl:   ?  ezetimibe (ZETIA) 10 mg tablet, Take one tablet by mouth at bedtime daily., Disp: 90 tablet, Rfl: 3  ?  famotidine (PEPCID) 40 mg tablet, Take 40 mg by mouth at bedtime daily., Disp: , Rfl:   ?  fexofenadine(+) (ALLEGRA) 180 mg tablet, Take 180 mg by mouth daily., Disp: , Rfl:   ?  fluticasone-umeclidin-vilanter (TRELEGY ELLIPTA) 100-62.5-25 mcg inhaler, Inhale 1 Dose by mouth into the lungs at bedtime daily., Disp: , Rfl:   ?  furosemide (LASIX) 40 mg tablet, TAKE 2 TABLETS ONCE DAILY, TAKE 3 TABLETS ON Monday/WEDNESDAY/FRIDAY AS DIRECTED BY       CARDIOLOGY, Disp: 270 tablet, Rfl: 3  ?  gabapentin (NEURONTIN) 300 mg capsule,  Take one capsule by mouth every 6 hours., Disp: 270 capsule, Rfl: 3  ?  glucosamine su 2KCl-chondroit 500-400 mg tab, Take 1 Tab by mouth twice daily., Disp: , Rfl:   ?  ipratropium bromide (ATROVENT) 42 mcg (0.06 %) nasal spray, Apply 2 sprays to each nostril as directed twice daily as needed., Disp: , Rfl:   ?  losartan (COZAAR) 50 mg tablet, Take one tablet by mouth daily., Disp: 90 tablet, Rfl: 3  ?  metoprolol XL (TOPROL XL) 100 mg extended release tablet, Take one tablet by mouth daily., Disp: 90 tablet, Rfl: 3  ?  mucus clearing device (AEROBIKA OSCILLATING PEP SYSTM MISC), Use  as directed. Use with salt water and inhale twice daily, Disp: , Rfl:   ?  MYRBETRIQ 50 mg tablet, Take 1 tablet by mouth daily. Does not take on days he takes Ditropan, Disp: , Rfl:   ?  OMEGA-3 FATTY ACIDS-FISH OIL PO, Take 1 capsule by mouth twice daily., Disp: , Rfl:   ?  other medication, mupirocin ointment and budesonide solution mixed with warm water Use as nasal irrigation twice daily, Disp: , Rfl:   ?  oxybutynin chloride (DITROPAN) 5 mg tablet, Take 5 mg by mouth twice daily. Does not take on days he takes Myrbetriq, Disp: , Rfl:   ?  pantoprazole DR (PROTONIX) 40 mg tablet, Take 40 mg by mouth twice daily., Disp: , Rfl:   ?  polyethylene glycol 3350 (GLYCOLAX; MIRALAX) 17 gram/dose powder, Take 17 g by mouth daily. (Patient taking differently: Take 17 g by mouth nightly as needed.), Disp: 595 g, Rfl: 3  ?  potassium chloride SR (K-DUR) 20 mEq tablet, Take one tab on Sunday, Tuesday, Thursday, and Saturday.  Take two tabs on Mon, Wed and Friday., Disp: 140 tablet, Rfl: 3  ?  rosuvastatin (CRESTOR) 40 mg tablet, Take one tablet by mouth at bedtime daily., Disp: 90 tablet, Rfl: 3  ? sildenafil(+) (VIAGRA) 100 mg tablet, Take 1 Tab by mouth as Needed for Erectile dysfunction., Disp: 6 Tab, Rfl: 12  ?  spironolactone (ALDACTONE) 25 mg tablet, Take one tablet by mouth daily. Take with food., Disp: 90 tablet, Rfl: 3  ?  tamsulosin (FLOMAX) 0.4 mg capsule, Take 0.4 mg by mouth daily. Do not crush, chew or open capsules. Take 30 minutes following the same meal each day., Disp: , Rfl:   ?  testosterone cypionate 200 mg/mL kit, Inject 1 mL into the muscle every 14 days., Disp: , Rfl:   ?  tiZANidine (ZANAFLEX) 2 mg tablet, Take three tablets by mouth at bedtime as needed. 1 to 3 tabs qhs prn, Disp: 90 tablet, Rfl: 3  ?  traMADoL (ULTRAM) 50 mg tablet, every 4-6 hours, Disp: , Rfl:   ?  traZODone (DESYREL) 100 mg tablet, bedtime, Disp: , Rfl:   ?  Vacuum Erection Device System kit, Use as directed for sexual activity., Disp: 1 kit, Rfl: 11  ?  XARELTO 20 mg tablet, TAKE 1 TABLET BY MOUTH DAILY - MUST BE WITH EVENING MEAL, Disp: , Rfl:     Current Facility-Administered Medications:   ?  fentaNYL citrate PF (SUBLIMAZE) injection 50-100 mcg, 50-100 mcg, Intravenous, Q2 MIN PRN, Evelina Bucy, MD, 50 mcg at 04/20/21 1454  ?  fentaNYL citrate PF (SUBLIMAZE) injection 50-100 mcg, 50-100 mcg, Intravenous, Q2 MIN PRN, Kezia Benevides, MD, 75 mcg at 12/24/20 1605  ?  lidocaine PF 1% (10 mg/mL) injection 0.2 mL, 0.2 mL,  Injection, PRN, Evelina Bucy, MD  ?  midazolam (VERSED) injection 1-2 mg, 1-2 mg, Intravenous, Q2 MIN PRN, Evelina Bucy, MD, 1 mg at 04/20/21 1454  ?  midazolam (VERSED) injection 1-2 mg, 1-2 mg, Intravenous, Q2 MIN PRN, Lourdes Sledge, Etana Beets, MD, 2 mg at 12/24/20 1605    Physical examination:   BP 120/65  - Pulse 81  - Temp 36.6 ?C (97.8 ?F)  - Resp 18  - Ht 180.3 cm (5' 11)  - Wt 107.5 kg (237 lb)  - BMI 33.05 kg/m?   Pain Score: Three    General: The patient is a well-developed, well nourished 76 y.o. male in no acute distress.   HEENT: Head is normocephalic and atraumatic. EOMI bilaterally.   Cardiac: Based on palpation, pulse appears to be regular rate and rhythm.   Pulmonary: The patient has unlabored respirations and bilateral symmetric chest excursion.   Abdomen: Soft, nontender, and nondistended. There is no rebound or guarding.   Extremities: No clubbing, cyanosis, or edema.     Neurologic:   The patient is alert and oriented times 3.   Cranial nerves II through XII are intact without any focal deficits.     Musculoskeletal:   Walks with cane.     L-Spine   There is mild low lumbar paraspinal tenderness. Paraspinal muscle tone is increased.  Facet loading is positive.  ROM with flexion, extension, rotation, and lateral bending is intact.  Strength is equal and adequate bilaterally in the flexors and extensors of the bilateral lower extremities.   SLR is negative bilaterally.         Results for orders placed during the hospital encounter of 08/24/20    MRI L-SPINE WO/W CONTRAST    Addendum 08/25/2020  5:39 PM  Finalized by Ivory Broad, M.D. on 08/24/2020 5:00 PM. Dictated by Delfin Gant, M.D. on 08/24/2020 3:54 PM.Addendum:  Delfin Gant discussed these findings via telephone with Dr. Francee Nodal nurse, Vassie Moment, at 8:34 AM on 08/25/2020.      Approved by Delfin Gant, M.D. on 08/25/2020 8:35 AM    By my electronic signature, I attest that I have personally reviewed the images for this examination and formulated the interpretations and opinions expressed in this report      Finalized by Ivory Broad, M.D. on 08/25/2020 5:36 PM. Dictated by Delfin Gant, M.D. on 08/25/2020 8:33 AM.    Narrative  EXAM: MRI L-SPINE    HISTORY:    , lumbar radiculapathy,    Technique: Multiple sagittal and axial MR sequences were obtained of the lumbar spine with and without MultiHance contrast.    Comparison: CT lumbar spine August 27, 2014    FINDINGS:    There are 5 nonrib-bearing lumbar type vertebral bodies. There is left convexity lumbar spinal curvature. Trace retrolisthesis at L2-L3 and trace anterolisthesis of L5-S1. X-Stop devices are seen at the spinous processes of the lumbar spine. Vertebral body heights are maintained. Multilevel vertebral endplate marrow changes. No suspicious bone marrow replacing lesion. The conus is normal in appearance and position at the T12-L1 level. abnormal enhancing lesion is identified. Bilateral renal atrophy. Small upper pole right renal cyst. There is an additional 1.3 cm upper pole right renal lesion which demonstrates T2 and T1 hypointensity (series 5, image 8).    T12-L1: No significant central spinal or neuroforaminal stenosis.    L1-L2: Marked disc degeneration, mild circumferential disc bulge, and facet hypertrophy. Mild central spinal stenosis. Moderate left and marked right neural foraminal stenosis.  L2-L3: Trace retrolisthesis, moderate disc degeneration, circumferential disc bulge, facet hypertrophy, and mild malignant flavum thickening. Mild central spinal stenosis. Moderate left and marked right neuroforaminal stenosis.    L3-L4: Moderate disc degeneration, circumferential disc bulge, and facet hypertrophy. Moderate to marked spinal stenosis. Marked bilateral neuroforaminal stenosis.    L4-L5: Mild disc degeneration, circumferential disc bulge with superimposed central disc protrusion, ligamentum flavum thickening, and facet hypertrophy. Moderate to marked central spinal stenosis. Marked bilateral neural foraminal stenosis.    L5-S1: Mild disc degeneration, circumferential disc bulge, and facet hypertrophy. Moderate central spinal stenosis. Marked bilateral neural foraminal stenosis.    Impression  1.  Multilevel degenerative central spinal stenosis, greatest of moderate to marked degree at L3-L4, L4-L5, and L5-S1.  2.  Multilevel degenerative neuroforaminal stenosis, greatest of marked degree on the right at L1-L2 and L2-L3 as well as bilaterally at L3-L4, L4-5, and L5-S1.  3.  Left convexity lumbar spinal curvature.  4.  Trace retrolisthesis of L2-L3 and trace anterolisthesis of L5-S1.  5.  T2 and T1 hypointense peripheral right renal lesion. MRI abdomen (renal mass protocol) is recommended for further evaluation.    By my electronic signature, I attest that I have personally reviewed the images for this examination and formulated the interpretations and opinions expressed in this report          Last Cr and LFT's:  Creatinine   Date Value Ref Range Status   03/31/2021 1.30 (H) 0.4 - 1.24 MG/DL Final     AST (SGOT)   Date Value Ref Range Status   11/22/2018 25 7 - 40 U/L Final     ALT (SGPT)   Date Value Ref Range Status   11/22/2018 24 7 - 56 U/L Final     Alk Phosphatase   Date Value Ref Range Status   11/22/2018 51 25 - 110 U/L Final     Total Bilirubin   Date Value Ref Range Status   11/22/2018 0.8 0.3 - 1.2 MG/DL Final          Assessment:    Johnny [name] is a 76 y.o. 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 who presents for evaluation of pain.    The pain complaints are most likely due to:    1. Lumbosacral radiculopathy        2. Chronic pain of right knee        3. Neuropathic pain        4. DDD (degenerative disc disease), lumbosacral            Patient has had an adequate trial of > 12 months of rest, exercise, multimodal treatment, and the passage of time without improvement of symptoms. The pain has significant impact on the daily quality of life.     Plan:    1.Discussed care options with patient. Plan to move forward with SCS trial at next available appointment.  2.Continue tizanidine as prescribed  3. Encouraged to continue at home PT program.  4. Discussed need to  stop xarelto prior to SCS trial.   5. Follow up during trial      Risks/benefits of all pharmacologic and interventional treatments discussed and questions answered.

## 2021-10-07 ENCOUNTER — Encounter: Admit: 2021-10-07 | Discharge: 2021-10-07 | Payer: MEDICARE

## 2021-10-08 ENCOUNTER — Encounter: Admit: 2021-10-08 | Discharge: 2021-10-08 | Payer: MEDICARE

## 2021-10-08 MED ORDER — CARBIDOPA-LEVODOPA 25-100 MG PO TAB
1 | ORAL_TABLET | Freq: Three times a day (TID) | ORAL | 0 refills | Status: AC
Start: 2021-10-08 — End: ?

## 2021-10-08 NOTE — Telephone Encounter
Received refill request for carbidopa-levodopa 25/100.  Medication is listed to continue under plan of care from last visit.  Patient has been seen in the last year and has a future appointment scheduled.  Refills authorized with neurologist to co-sign.

## 2021-10-12 ENCOUNTER — Ambulatory Visit: Admit: 2021-10-12 | Discharge: 2021-10-12 | Payer: MEDICARE

## 2021-10-12 ENCOUNTER — Encounter: Admit: 2021-10-12 | Discharge: 2021-10-12 | Payer: MEDICARE

## 2021-10-12 DIAGNOSIS — Z01818 Encounter for other preprocedural examination: Secondary | ICD-10-CM

## 2021-10-12 DIAGNOSIS — M5417 Radiculopathy, lumbosacral region: Secondary | ICD-10-CM

## 2021-10-12 DIAGNOSIS — M5137 Other intervertebral disc degeneration, lumbosacral region: Secondary | ICD-10-CM

## 2021-10-12 DIAGNOSIS — Z9689 Presence of other specified functional implants: Secondary | ICD-10-CM

## 2021-10-12 NOTE — Progress Notes
[facility] NOTE  Subjective     SUBJECTIVE:  Following up after SCS trial   Manufacturer: [employer] High Frequency (HF-10)  Duration of trial: 7 days  Location of pain: lower back, LLE  Improvement in pain: 70%  Improvement in function: Yes  Reduction of pain medication: No  Side effects/complications: none  Patient comments: Pain improved overall by 70%.  He was able to walk without cane.  Also, able to stand for longer periods of time.       Nicotine use: No  Hemoglobin A1C (if applicable): No results found for: A1C  History of surgical infections: No       Review of Systems    Current Outpatient Medications:   ?  acetaminophen (TYLENOL) 325 mg tablet, Take two tablets by mouth every 4 hours as needed for Pain., Disp: 300 tablet, Rfl: 1  ?  albuterol (VENTOLIN HFA, PROAIR HFA) 90 mcg/actuation inhaler, Inhale 2 Puffs by mouth four times daily as needed for Wheezing., Disp: , Rfl:   ?  aspirin 81 mg chewable tablet, Take 1 Tab by mouth daily. WAIT to restart until finished taking lovenox injections. (Patient taking differently: Chew 81 mg by mouth at bedtime daily.), Disp: 90 Tab, Rfl: 3  ?  carbidopa/levodopa (SINEMET) 25/100 mg tablet, Take one tablet by mouth three times daily., Disp: 270 tablet, Rfl: 0  ?  cetirizine (ZYRTEC) 10 mg tablet, Take 10 mg by mouth daily., Disp: , Rfl:   ?  cholecalciferol (Vitamin D3) (VITAMIN D-3) 1,000 units tablet, Take 1,000 Units by mouth twice daily.  , Disp: , Rfl:   ?  dextromethorphan/guaiFENesin (MUCINEX DM) 30/600 mg Tb12, Take 2 tablets by mouth daily as needed., Disp: , Rfl:   ?  docusate (COLACE) 100 mg capsule, Take 200 mg by mouth at bedtime daily. Pt is taking 550 mg daily, Disp: , Rfl:   ?  ezetimibe (ZETIA) 10 mg tablet, Take one tablet by mouth at bedtime daily., Disp: 90 tablet, Rfl: 3  ?  famotidine (PEPCID) 40 mg tablet, Take 40 mg by mouth at bedtime daily., Disp: , Rfl:   ?  fexofenadine(+) (ALLEGRA) 180 mg tablet, Take 180 mg by mouth daily., Disp: , Rfl:   ?  fluticasone-umeclidin-vilanter (TRELEGY ELLIPTA) 100-62.5-25 mcg inhaler, Inhale 1 Dose by mouth into the lungs at bedtime daily., Disp: , Rfl:   ?  furosemide (LASIX) 40 mg tablet, TAKE 2 TABLETS ONCE DAILY, TAKE 3 TABLETS ON Monday/WEDNESDAY/FRIDAY AS DIRECTED BY       CARDIOLOGY, Disp: 270 tablet, Rfl: 3  ?  gabapentin (NEURONTIN) 300 mg capsule, Take one capsule by mouth every 6 hours., Disp: 270 capsule, Rfl: 3  ?  glucosamine su 2KCl-chondroit 500-400 mg tab, Take 1 Tab by mouth twice daily., Disp: , Rfl:   ?  ipratropium bromide (ATROVENT) 42 mcg (0.06 %) nasal spray, Apply 2 sprays to each nostril as directed twice daily as needed., Disp: , Rfl:   ?  losartan (COZAAR) 50 mg tablet, Take one tablet by mouth daily., Disp: 90 tablet, Rfl: 3  ?  metoprolol XL (TOPROL XL) 100 mg extended release tablet, Take one tablet by mouth daily., Disp: 90 tablet, Rfl: 3  ?  mucus clearing device (AEROBIKA OSCILLATING PEP SYSTM MISC), Use  as directed. Use with salt water and inhale twice daily, Disp: , Rfl:   ?  MYRBETRIQ 50 mg tablet, Take 1 tablet by mouth daily. Does not take on days he takes Ditropan, Disp: ,  Rfl:   ?  OMEGA-3 FATTY ACIDS-FISH OIL PO, Take 1 capsule by mouth twice daily., Disp: , Rfl:   ?  other medication, mupirocin ointment and budesonide solution mixed with warm water Use as nasal irrigation twice daily, Disp: , Rfl:   ?  oxybutynin chloride (DITROPAN) 5 mg tablet, Take 5 mg by mouth twice daily. Does not take on days he takes Myrbetriq, Disp: , Rfl:   ?  pantoprazole DR (PROTONIX) 40 mg tablet, Take 40 mg by mouth twice daily., Disp: , Rfl:   ?  polyethylene glycol 3350 (GLYCOLAX; MIRALAX) 17 gram/dose powder, Take 17 g by mouth daily. (Patient taking differently: Take 17 g by mouth nightly as needed.), Disp: 595 g, Rfl: 3  ?  potassium chloride SR (K-DUR) 20 mEq tablet, Take one tab on Sunday, Tuesday, Thursday, and Saturday.  Take two tabs on Mon, Wed and Friday., Disp: 140 tablet, Rfl: 3  ?  rosuvastatin (CRESTOR) 40 mg tablet, Take one tablet by mouth at bedtime daily., Disp: 90 tablet, Rfl: 3  ?  sildenafil(+) (VIAGRA) 100 mg tablet, Take 1 Tab by mouth as Needed for Erectile dysfunction., Disp: 6 Tab, Rfl: 12  ?  spironolactone (ALDACTONE) 25 mg tablet, Take one tablet by mouth daily. Take with food., Disp: 90 tablet, Rfl: 3  ?  tamsulosin (FLOMAX) 0.4 mg capsule, Take 0.4 mg by mouth daily. Do not crush, chew or open capsules. Take 30 minutes following the same meal each day., Disp: , Rfl:   ?  testosterone cypionate 200 mg/mL kit, Inject 1 mL into the muscle every 14 days., Disp: , Rfl:   ?  tiZANidine (ZANAFLEX) 2 mg tablet, Take three tablets by mouth at bedtime as needed. 1 to 3 tabs qhs prn, Disp: 90 tablet, Rfl: 3  ?  traMADoL (ULTRAM) 50 mg tablet, every 4-6 hours, Disp: , Rfl:   ?  traZODone (DESYREL) 100 mg tablet, bedtime, Disp: , Rfl:   ?  Vacuum Erection Device System kit, Use as directed for sexual activity., Disp: 1 kit, Rfl: 11  ?  XARELTO 20 mg tablet, TAKE 1 TABLET BY MOUTH DAILY - MUST BE WITH EVENING MEAL, Disp: , Rfl:     Current Facility-Administered Medications:   ?  fentaNYL citrate PF (SUBLIMAZE) injection 50-100 mcg, 50-100 mcg, Intravenous, Q2 MIN PRN, Evelina Bucy, MD, 50 mcg at 10/05/21 1426  ?  fentaNYL citrate PF (SUBLIMAZE) injection 50-100 mcg, 50-100 mcg, Intravenous, Q2 MIN PRN, Evelina Bucy, MD, 50 mcg at 04/20/21 1454  ?  fentaNYL citrate PF (SUBLIMAZE) injection 50-100 mcg, 50-100 mcg, Intravenous, Q2 MIN PRN, Lourdes Sledge, Usman, MD, 75 mcg at 12/24/20 1605  ?  lidocaine PF 1% (10 mg/mL) injection 0.2 mL, 0.2 mL, Injection, PRN, Lourdes Sledge, Usman, MD  ?  midazolam (VERSED) injection 1-2 mg, 1-2 mg, Intravenous, Q2 MIN PRN, Lourdes Sledge, Usman, MD, 1 mg at 10/05/21 1427  ?  midazolam (VERSED) injection 1-2 mg, 1-2 mg, Intravenous, Q2 MIN PRN, Evelina Bucy, MD, 1 mg at 04/20/21 1454  ?  midazolam (VERSED) injection 1-2 mg, 1-2 mg, Intravenous, Q2 MIN PRN, Evelina Bucy, MD, 2 mg at 12/24/20 1605  Allergies   Allergen Reactions   ? Clarithromycin RASH     Allergy recorded in SMS: Biaxin~Reactions: RASH~THRUSH   ? Ketoconazole SEE COMMENTS     Had bloodshot eyes after use, and sore.   ? Lisinopril COUGH     Physical Exam  General: Alert, oriented, no acute distress  Resp: Non labored breathing  Skin: lead insertion site CDI, leads x 2 pulled without difficulty and contacts intact   There were no vitals filed for this visit.        There is no height or weight on file to calculate BMI.           IMPRESSION:  1. Spinal cord stimulator status    2. Preoperative clearance    3. Lumbosacral radiculopathy    4. DDD (degenerative disc disease), lumbosacral          PLAN:  X rays at end of trial with lead position at T10-T11 vertebral bodies  Pain best controlled with stimulation at disc space T10-T11  Patient desires permanent implant: Yes    Dressing removed, leads pulled, area cleansed with chloroprep. Bandage applied over site.   Instructed pt ok to shower if no oozing or redness at site after 2 hours.   Instructed to call if any sx of infection including fevers, swelling, drainage, increased pain, inflammation or warmth to site.    MRSA swab today.

## 2021-10-13 ENCOUNTER — Ambulatory Visit: Admit: 2021-10-13 | Discharge: 2021-10-13 | Payer: MEDICARE

## 2021-10-13 ENCOUNTER — Encounter: Admit: 2021-10-13 | Discharge: 2021-10-13 | Payer: MEDICARE

## 2021-10-13 DIAGNOSIS — I10 Essential (primary) hypertension: Secondary | ICD-10-CM

## 2021-10-13 DIAGNOSIS — I2510 Atherosclerotic heart disease of native coronary artery without angina pectoris: Secondary | ICD-10-CM

## 2021-10-13 DIAGNOSIS — I340 Nonrheumatic mitral (valve) insufficiency: Secondary | ICD-10-CM

## 2021-10-13 DIAGNOSIS — G5600 Carpal tunnel syndrome, unspecified upper limb: Secondary | ICD-10-CM

## 2021-10-13 DIAGNOSIS — J449 Chronic obstructive pulmonary disease, unspecified: Secondary | ICD-10-CM

## 2021-10-13 DIAGNOSIS — M1990 Unspecified osteoarthritis, unspecified site: Secondary | ICD-10-CM

## 2021-10-13 DIAGNOSIS — Z95810 Presence of automatic (implantable) cardiac defibrillator: Secondary | ICD-10-CM

## 2021-10-13 DIAGNOSIS — R053 Chronic cough: Secondary | ICD-10-CM

## 2021-10-13 DIAGNOSIS — I110 Hypertensive heart disease with heart failure: Secondary | ICD-10-CM

## 2021-10-13 DIAGNOSIS — E785 Hyperlipidemia, unspecified: Secondary | ICD-10-CM

## 2021-10-13 DIAGNOSIS — I4891 Unspecified atrial fibrillation: Secondary | ICD-10-CM

## 2021-10-13 DIAGNOSIS — G4733 Obstructive sleep apnea (adult) (pediatric): Secondary | ICD-10-CM

## 2021-10-13 DIAGNOSIS — I509 Heart failure, unspecified: Secondary | ICD-10-CM

## 2021-10-13 DIAGNOSIS — J302 Other seasonal allergic rhinitis: Secondary | ICD-10-CM

## 2021-10-13 DIAGNOSIS — K449 Diaphragmatic hernia without obstruction or gangrene: Secondary | ICD-10-CM

## 2021-10-13 DIAGNOSIS — I480 Paroxysmal atrial fibrillation: Secondary | ICD-10-CM

## 2021-10-13 DIAGNOSIS — I219 Acute myocardial infarction, unspecified: Secondary | ICD-10-CM

## 2021-10-13 DIAGNOSIS — N529 Male erectile dysfunction, unspecified: Secondary | ICD-10-CM

## 2021-10-13 DIAGNOSIS — M48061 Spinal stenosis, lumbar region without neurogenic claudication: Secondary | ICD-10-CM

## 2021-10-13 DIAGNOSIS — E669 Obesity, unspecified: Secondary | ICD-10-CM

## 2021-10-13 DIAGNOSIS — K219 Gastro-esophageal reflux disease without esophagitis: Secondary | ICD-10-CM

## 2021-10-13 DIAGNOSIS — R7303 Prediabetes: Secondary | ICD-10-CM

## 2021-10-13 DIAGNOSIS — T148XXA Other injury of unspecified body region, initial encounter: Secondary | ICD-10-CM

## 2021-10-13 DIAGNOSIS — G20 Parkinson's disease: Secondary | ICD-10-CM

## 2021-10-13 DIAGNOSIS — M549 Dorsalgia, unspecified: Secondary | ICD-10-CM

## 2021-10-13 DIAGNOSIS — R269 Unspecified abnormalities of gait and mobility: Secondary | ICD-10-CM

## 2021-10-13 DIAGNOSIS — R0989 Other specified symptoms and signs involving the circulatory and respiratory systems: Secondary | ICD-10-CM

## 2021-10-13 DIAGNOSIS — I255 Ischemic cardiomyopathy: Secondary | ICD-10-CM

## 2021-10-13 NOTE — Progress Notes
Reason for visit:  Johnny [name] is 76 y.o. right handed male who presents today for evaluation of hand tremors.  Presents with wife who aids in providing history. Transitioning care from Dr. Raleigh Callas who has left [facility].    Dr. Raleigh Callas History at initial visit in 10/2020: He started noticing tremor around 20 years ago. Wife remembers noticing tremor when he hold telephone. Tremor has slightly worsened over time. He now notice tremor in both hands, tremor is intermittent. Tremor occurs with activities and also at rest. He denies tremor in head, voice or legs.   He feels steady to most extent, sometimes feels unsteady. He use a cane to walk. Has history of bilateral knee replacement and chronic lower back pain. He is being considered for spinal cord stimulator and he would like to get another opinion.   He denies dysphagia, speech changes.   He has word finding difficulty. Reports mild issues with short term memory.    He rarely report yelling in sleep. He denies hallucinations, denies constipation. He has impaired sense of smell for several years.   He denies dizziness on standing up.   He has history of CAD, COPD, atrial fibrillation, s/p ICD, HTN, OSA, neuropathy.     Last seen by Dr. 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 acetaminophen (TYLENOL) 325 mg tablet Take two tablets by mouth every 4 hours as needed for Pain. 300 tablet 1   ? albuterol (VENTOLIN HFA, PROAIR HFA) 90 mcg/actuation inhaler Inhale 2 Puffs by mouth four times daily as needed for Wheezing.     ? aspirin 81 mg chewable tablet Take 1 Tab by mouth daily. WAIT to restart until finished taking lovenox injections. (Patient taking differently: Chew 81 mg by mouth at bedtime daily.) 90 Tab 3   ? carbidopa/levodopa (SINEMET) 25/100 mg tablet Take one tablet by mouth three times daily. 270 tablet 0   ? cetirizine (ZYRTEC) 10 mg tablet Take 10 mg by mouth daily.     ? cholecalciferol (Vitamin D3) (VITAMIN D-3) 1,000 units tablet Take 1,000 Units by mouth twice daily.       ? dextromethorphan/guaiFENesin (MUCINEX DM) 30/600 mg Tb12 Take 2 tablets by mouth daily as needed.     ? docusate (COLACE) 100 mg capsule Take 200 mg by mouth at bedtime daily. Pt is taking 550 mg daily     ? ezetimibe (ZETIA) 10 mg tablet Take one tablet by mouth at bedtime daily. 90 tablet 3   ? famotidine (PEPCID) 40 mg tablet Take 40 mg by mouth at bedtime daily.     ? fexofenadine(+) (ALLEGRA) 180 mg tablet Take 180 mg by mouth daily.     ? fluticasone-umeclidin-vilanter (TRELEGY ELLIPTA) 100-62.5-25 mcg inhaler Inhale 1  Dose by mouth into the lungs at bedtime daily.     ? furosemide (LASIX) 40 mg tablet TAKE 2 TABLETS ONCE DAILY, TAKE 3 TABLETS ON Monday/WEDNESDAY/FRIDAY AS DIRECTED BY       CARDIOLOGY 270 tablet 3   ? gabapentin (NEURONTIN) 300 mg capsule Take one capsule by mouth every 6 hours. 270 capsule 3   ? glucosamine su 2KCl-chondroit 500-400 mg tab Take 1 Tab by mouth twice daily.     ? ipratropium bromide (ATROVENT) 42 mcg (0.06 %) nasal spray Apply 2 sprays to each nostril as directed twice daily as needed.     ? losartan (COZAAR) 50 mg tablet Take one tablet by mouth daily. 90 tablet 3   ? metoprolol XL (TOPROL XL) 100 mg extended release tablet Take one tablet by mouth daily. 90 tablet 3   ? mucus clearing device (AEROBIKA OSCILLATING PEP SYSTM MISC) Use  as directed. Use with salt water and inhale twice daily     ? MYRBETRIQ 50 mg tablet Take 1 tablet by mouth daily. Does not take on days he takes Ditropan     ? OMEGA-3 FATTY ACIDS-FISH OIL PO Take 1 capsule by mouth twice daily.     ? other medication mupirocin ointment and budesonide solution mixed with warm water  Use as nasal irrigation twice daily     ? oxybutynin chloride (DITROPAN) 5 mg tablet Take 5 mg by mouth twice daily. Does not take on days he takes Myrbetriq     ? pantoprazole DR (PROTONIX) 40 mg tablet Take 40 mg by mouth twice daily.     ? polyethylene glycol 3350 (GLYCOLAX; MIRALAX) 17 gram/dose powder Take 17 g by mouth daily. (Patient taking differently: Take 17 g by mouth nightly as needed.) 595 g 3   ? potassium chloride SR (K-DUR) 20 mEq tablet Take one tab on Sunday, Tuesday, Thursday, and Saturday.  Take two tabs on Mon, Wed and Friday. 140 tablet 3   ? pyRIDostigmine bromide (MESTINON) 60 mg tablet      ? rosuvastatin (CRESTOR) 40 mg tablet Take one tablet by mouth at bedtime daily. 90 tablet 3   ? sildenafil(+) (VIAGRA) 100 mg tablet Take 1 Tab by mouth as Needed for Erectile dysfunction. 6 Tab 12   ? spironolactone (ALDACTONE) 25 mg tablet Take one tablet by mouth daily. Take with food. 90 tablet 3   ? tamsulosin (FLOMAX) 0.4 mg capsule Take 0.4 mg by mouth daily. Do not crush, chew or open capsules. Take 30 minutes following the same meal each day.     ? testosterone cypionate 200 mg/mL kit Inject 1 mL into the muscle every 14 days.     ? tiZANidine (ZANAFLEX) 2 mg tablet Take three tablets by mouth at bedtime as needed. 1 to 3 tabs qhs prn 90 tablet 3   ? traMADoL (ULTRAM) 50 mg tablet every 4-6 hours     ? traZODone (DESYREL) 100 mg tablet bedtime     ? Vacuum Erection Device System kit Use as directed for sexual activity. 1 kit 11   ? XARELTO 20 mg tablet TAKE 1 TABLET BY MOUTH DAILY - MUST BE WITH EVENING MEAL       Current Facility-Administered Medications on File Prior to Visit   Medication Dose Route Frequency Provider Last Rate Last Admin   ? fentaNYL citrate PF (SUBLIMAZE) injection 50-100 mcg  50-100 mcg Intravenous Q2 MIN PRN Evelina Bucy, MD   50 mcg at 10/05/21 1426   ?  fentaNYL citrate PF (SUBLIMAZE) injection 50-100 mcg  50-100 mcg Intravenous Q2 MIN PRN Evelina Bucy, MD   50 mcg at 04/20/21 1454   ? fentaNYL citrate PF (SUBLIMAZE) injection 50-100 mcg  50-100 mcg Intravenous Q2 MIN PRN Evelina Bucy, MD   75 mcg at 12/24/20 1605   ? lidocaine PF 1% (10 mg/mL) injection 0.2 mL  0.2 mL Injection PRN Evelina Bucy, MD       ? midazolam (VERSED) injection 1-2 mg  1-2 mg Intravenous Q2 MIN PRN Evelina Bucy, MD   1 mg at 10/05/21 1427   ? midazolam (VERSED) injection 1-2 mg  1-2 mg Intravenous Q2 MIN PRN Evelina Bucy, MD   1 mg at 04/20/21 1454   ? midazolam (VERSED) injection 1-2 mg  1-2 mg Intravenous Q2 MIN PRN Evelina Bucy, MD   2 mg at 12/24/20 1605       Past Medical History:    Medical History:   Diagnosis Date   ? Atrial fibrillation (HCC) 02/08/2009   ? Back pain    ? CAD (coronary artery disease) 02/08/2009   ? Carpal tunnel syndrome    ? Chronic cough    ? Congestive heart disease (HCC)    ? COPD (chronic obstructive pulmonary disease) (HCC)    ? Erectile dysfunction, vasculogenic    ? Fracture    ? GERD (gastroesophageal reflux disease)     controlled with elevated HOB, protonix and pepcid    ? Hiatal hernia    ? Hyperlipemia 02/11/2009   ? Hypertension 02/11/2009   ? 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 Mitral regurgitation    ? Myocardial infarction [facility]) 2006   ? Obesity, Class I, 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)    ? OSA on CPAP 02/11/2009   ? Osteoarthritis    ? Pre-diabetes    ? S/P ICD (internal cardiac defibrillator) procedure 02/11/2009   ? Seasonal allergies         Social History:    Social History     Socioeconomic History   ? Marital status: Married Tobacco Use   ? Smoking status: Never   ? Smokeless tobacco: Never   Vaping Use   ? Vaping Use: Never used   Substance and Sexual Activity   ? Alcohol use: No     Alcohol/week: 0.0 standard drinks     Comment: very rarely 4 drinks per year   ? Drug use: No       Family History:   Family History   Problem Relation Age of Onset   ? Cancer Father    ? Hypertension Mother         Allergies:   Allergies   Allergen Reactions   ? Clarithromycin RASH     Allergy recorded in SMS: Biaxin~Reactions: RASH~THRUSH   ? Ketoconazole SEE COMMENTS     Had bloodshot eyes after use, and sore.   ? Lisinopril COUGH   ? Oxycodone UNKNOWN     Nightmare         PHYSICAL EXAMINATION:      VITAL SIGNS:   Vitals:    10/13/21 1318 10/13/21 1331   BP: 129/72 91/59   Pulse: 80 77       Body mass index is 32.78 kg/m?Marland Kitchen    Overweight, SOB when talking     NEUROLOGICAL EXAM:  MS: Alert, awake, cooperative. Speech fluent without paraphasic errors. Able to provide accurate history, remote memory  intact. Good fund of knowledge.    Movement disorders exam  Mild hypophonia  Mild facial masking  Mild intermittent L resting tremor  Mild bilateral postural and action tremor   Mild bilateral rigidity  Mild bilateral L > R bradykinesia with finger taps, supination/pronation, hand grips, or foot taps/stomps  Stands up unassisted  Posture slightly stooped  Gait is narrow based, steady with fair stride length and arm swing , antalgic of back     UPDRS Motor:     Speech: Slight  Facial Expression: Slight  Tremor at Rest Jaw: Normal  Tremor at Rest RUE: Normal  Tremor at Rest LUE: Slight  Tremor at Rest RLE: Normal  Tremor at Rest LLE: Normal  Action of Postural Tremor of Hands R: Slight  Action of Postural Tremor of Hands L: Slight  Rigidity NECK: Slight  Rigidity RUE: Slight  Rigidity LUE: Slight  Rigidity RLE: Slight  Rigidity LLE: Slight  Finger Taps L: Mild  Hand Movements R: Slight  Hand Movements L: Slight  Rapid Alternating Movement of Hands R: Slight  Rapid Alternating Movements of Hands L: Mild  Leg Agility with Knee Bent R: Slight  Leg Agility with Knee Bent L: Slight  Arising From Chair: Slight  Posture: Slight  Gait: Slight  Postural Stability: Normal  Body Bradykinesia and Hypokinesia: Slight  Total Motor Exam: 24      Labs/Imaging:      Assessment/Plan:    Johnny [name] is a 76 y.o. right handed male who presents today for evaluation of hand tremors, symptom onset 1993 with worsening of L hand tremor recently. Examination reveals a mild bilateral postural/action tremor, with an intermittent L resting tremor, and mild L  > R bradykinesia/ rigidity. He was started on levodopa last visit and has noticed good improvement in tremor, and all movements. 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    He appears well dosed on current dopaminergic regimen - advised to continue carbidopa/levodopa IR 25/100mg  1 tab TID     PT referral provided     Encouraged exercise and healthy Mediterranean MIND diet for neuroprotection    Follow up in 6 months    Problem List Items Addressed This Visit    None  Visit Diagnoses     Parkinson disease (HCC)    -  Primary    Relevant Orders    AMB REFERRAL TO PHYSICAL THERAPY    Gait abnormality        Relevant Orders    AMB REFERRAL TO PHYSICAL THERAPY    Spinal stenosis of lumbar region, unspecified whether neurogenic claudication present        Relevant Orders    AMB REFERRAL TO PHYSICAL THERAPY            Beatrix Shipper, MD  Assistant Professor of Neurology  [facility]  [facility]          I spent a total of 40 minutes on this patient's care on the day of their visit excluding time spent related to any billed procedures. This time includes face-to-face time with the patient as well as time spent documenting in the medical record, reviewing patient's records and tests, obtaining history, placing orders, communicating with other healthcare professionals, counseling the patient, family, or caregiver, and/or care coordination for the diagnoses above.

## 2021-10-13 NOTE — Patient Instructions
please schedule an appointment with Dr. Pimentel in 6 months .  To schedule an appointment call [phone].     In order to provide you the best care possible we ask that you follow up as below:    For non-urgent questions please contact us through your MyChart account.   For all medication refills please contact your pharmacy or send a request through MyChart.     For all questions that may need to be addressed urgently please call the nursing triage voicemail at [phone] Monday - Friday 8-5 only. Please leave a detailed message with your name, date of birth, and reason for your call.      Please allow ~ 10 business days for the results of any testing to be reviewed. Please call our office if you have not heard from a nurse within this time frame.

## 2021-10-14 ENCOUNTER — Ambulatory Visit: Admit: 2021-10-14 | Discharge: 2021-10-14 | Payer: MEDICARE

## 2021-10-14 ENCOUNTER — Encounter: Admit: 2021-10-14 | Discharge: 2021-10-14 | Payer: MEDICARE

## 2021-10-14 DIAGNOSIS — M5417 Radiculopathy, lumbosacral region: Secondary | ICD-10-CM

## 2021-10-14 DIAGNOSIS — M5137 Other intervertebral disc degeneration, lumbosacral region: Secondary | ICD-10-CM

## 2021-10-15 ENCOUNTER — Encounter: Admit: 2021-10-15 | Discharge: 2021-10-15 | Payer: MEDICARE

## 2021-10-15 DIAGNOSIS — K219 Gastro-esophageal reflux disease without esophagitis: Secondary | ICD-10-CM

## 2021-10-15 DIAGNOSIS — I219 Acute myocardial infarction, unspecified: Secondary | ICD-10-CM

## 2021-10-15 DIAGNOSIS — I340 Nonrheumatic mitral (valve) insufficiency: Secondary | ICD-10-CM

## 2021-10-15 DIAGNOSIS — I4891 Unspecified atrial fibrillation: Secondary | ICD-10-CM

## 2021-10-15 DIAGNOSIS — G5600 Carpal tunnel syndrome, unspecified upper limb: Secondary | ICD-10-CM

## 2021-10-15 DIAGNOSIS — M1990 Unspecified osteoarthritis, unspecified site: Secondary | ICD-10-CM

## 2021-10-15 DIAGNOSIS — I10 Essential (primary) hypertension: Secondary | ICD-10-CM

## 2021-10-15 DIAGNOSIS — M549 Dorsalgia, unspecified: Secondary | ICD-10-CM

## 2021-10-15 DIAGNOSIS — R053 Chronic cough: Secondary | ICD-10-CM

## 2021-10-15 DIAGNOSIS — E669 Obesity, unspecified: Secondary | ICD-10-CM

## 2021-10-15 DIAGNOSIS — R7303 Prediabetes: Secondary | ICD-10-CM

## 2021-10-15 DIAGNOSIS — J302 Other seasonal allergic rhinitis: Secondary | ICD-10-CM

## 2021-10-15 DIAGNOSIS — K449 Diaphragmatic hernia without obstruction or gangrene: Secondary | ICD-10-CM

## 2021-10-15 DIAGNOSIS — T148XXA Other injury of unspecified body region, initial encounter: Secondary | ICD-10-CM

## 2021-10-15 DIAGNOSIS — I509 Heart failure, unspecified: Secondary | ICD-10-CM

## 2021-10-15 DIAGNOSIS — I2510 Atherosclerotic heart disease of native coronary artery without angina pectoris: Secondary | ICD-10-CM

## 2021-10-15 DIAGNOSIS — G4733 Obstructive sleep apnea (adult) (pediatric): Secondary | ICD-10-CM

## 2021-10-15 DIAGNOSIS — E785 Hyperlipidemia, unspecified: Secondary | ICD-10-CM

## 2021-10-15 DIAGNOSIS — J449 Chronic obstructive pulmonary disease, unspecified: Secondary | ICD-10-CM

## 2021-10-15 DIAGNOSIS — N529 Male erectile dysfunction, unspecified: Secondary | ICD-10-CM

## 2021-10-15 DIAGNOSIS — I255 Ischemic cardiomyopathy: Secondary | ICD-10-CM

## 2021-10-15 DIAGNOSIS — Z95810 Presence of automatic (implantable) cardiac defibrillator: Secondary | ICD-10-CM

## 2021-10-18 ENCOUNTER — Encounter: Admit: 2021-10-18 | Discharge: 2021-10-18 | Payer: MEDICARE

## 2021-10-25 ENCOUNTER — Encounter: Admit: 2021-10-25 | Discharge: 2021-10-25 | Payer: MEDICARE

## 2021-10-25 MED ORDER — ROSUVASTATIN 40 MG PO TAB
ORAL_TABLET | Freq: Every evening | ORAL | 3 refills | 90.00000 days | Status: AC
Start: 2021-10-25 — End: ?

## 2021-10-25 MED ORDER — SPIRONOLACTONE 25 MG PO TAB
ORAL_TABLET | Freq: Every day | ORAL | 3 refills | 90.00000 days | Status: AC
Start: 2021-10-25 — End: ?

## 2021-10-28 ENCOUNTER — Encounter: Admit: 2021-10-28 | Discharge: 2021-10-28 | Payer: MEDICARE

## 2021-10-28 NOTE — Telephone Encounter
-----   Message from Kathreen Cornfield, MD sent at 10/27/2021  5:06 PM CST -----  Regarding: RE: RCP: remote sent an alert for treated Atrial ATP events. follow up as needed w. RCP- just saw in clinic last week.  Keep watching  ----- Message -----  From: Christie Beckers, RN  Sent: 10/18/2021   5:24 PM CST  To: Kathreen Cornfield, MD  Subject: FW: RCP: remote sent an alert for treated At#    You just saw him 10/13/21. Please review remote. He is on Toprol XL 100mg  daily and Xarelto 20mg  daily.    Your note: "We may need to consider resuming antiarrhythmic medications (tikosyn) if he continues to have prolonged events or alternatively repeat AF ablation.  He will remain on anticoagulation."    Changes/recs?    Please reply to EP TEAM A.    Thanks!  ----- Message -----  From: , RN  Sent: 10/18/2021   4:15 PM CST  To: Cvm Nurse Ep Team A  Subject: RCP: remote sent an alert for treated Atrial#    RCP: remote 10/16/21 sent an alert for treated Atrial ATP events. follow up as needed w. RCP- just saw in clinic last week.

## 2021-11-04 ENCOUNTER — Encounter: Admit: 2021-11-04 | Discharge: 2021-11-04 | Payer: MEDICARE

## 2021-11-16 ENCOUNTER — Ambulatory Visit: Admit: 2021-11-16 | Discharge: 2021-11-16 | Payer: MEDICARE

## 2021-11-16 DIAGNOSIS — M5137 Other intervertebral disc degeneration, lumbosacral region: Secondary | ICD-10-CM

## 2021-11-16 DIAGNOSIS — M5417 Radiculopathy, lumbosacral region: Secondary | ICD-10-CM

## 2021-11-17 ENCOUNTER — Ambulatory Visit: Admit: 2021-11-17 | Discharge: 2021-11-17 | Payer: MEDICARE

## 2021-11-17 ENCOUNTER — Encounter: Admit: 2021-11-17 | Discharge: 2021-11-17 | Payer: MEDICARE

## 2021-11-17 MED ORDER — DEXMEDETOMIDINE IN 0.9 % NACL 20 MCG/5 ML (4 MCG/ML) IV SYRG
INTRAVENOUS | 0 refills | Status: DC
Start: 2021-11-17 — End: 2021-11-17
  Administered 2021-11-17: 18:00:00 8 ug via INTRAVENOUS

## 2021-11-17 MED ORDER — PHENYLEPHRINE HCL IN 0.9% NACL 1 MG/10 ML (100 MCG/ML) IV SYRG
INTRAVENOUS | 0 refills | Status: DC
Start: 2021-11-17 — End: 2021-11-17
  Administered 2021-11-17 (×5): 100 ug via INTRAVENOUS

## 2021-11-17 MED ORDER — ONDANSETRON HCL (PF) 4 MG/2 ML IJ SOLN
INTRAVENOUS | 0 refills | Status: DC
Start: 2021-11-17 — End: 2021-11-17
  Administered 2021-11-17: 19:00:00 4 mg via INTRAVENOUS

## 2021-11-17 MED ORDER — PROPOFOL 10 MG/ML IV EMUL 100 ML (INFUSION)(AM)(OR)
INTRAVENOUS | 0 refills | Status: DC
Start: 2021-11-17 — End: 2021-11-17
  Administered 2021-11-17: 18:00:00 150 ug/kg/min via INTRAVENOUS

## 2021-11-17 MED ORDER — CEFAZOLIN 1 GRAM IJ SOLR
INTRAVENOUS | 0 refills | Status: DC
Start: 2021-11-17 — End: 2021-11-17
  Administered 2021-11-17: 18:00:00 2 g via INTRAVENOUS

## 2021-11-17 MED ADMIN — LACTATED RINGERS IV SOLP [4318]: 1000 mL | INTRAVENOUS | @ 18:00:00 | Stop: 2021-11-17 | NDC 00338011704

## 2021-11-17 MED ADMIN — LIDOCAINE-EPINEPHRINE 1 %-1:100,000 IJ SOLN [15955]: 18 mL | INTRAMUSCULAR | @ 19:00:00 | Stop: 2021-11-17 | NDC 00409317817

## 2021-11-17 MED ADMIN — BACITRACIN ZINC 500 UNIT/GRAM TP OINT [13818]: 1 | TOPICAL | @ 19:00:00 | Stop: 2021-11-17 | NDC 14428000888

## 2021-11-17 NOTE — Anesthesia Pre-Procedure Evaluation
Anesthesia Pre-Procedure Evaluation    Name: Johnny [name]      MRN: [id number]     DOB: [birth year]     Age: 76 y.o.     Sex: male   _________________________________________________________________________     Procedure Info:   Procedure Information     Date/Time: 11/17/21 1150    Procedures:       INSERTION SPINAL NEUROSTIMULATOR PULSE GENERATOR/ RECEIVER - c-arm, jackson table, 1.5 hrs      PERCUTANEOUS IMPLANTATION EPIDURAL NEUROSTIMULATOR ELECTRODE ARRAY x2      ELECTRONIC ANALYSIS IMPLANTED NEUROSTIMULATOR PULSE GENERATOR SYSTEM - COMPLEX SPINAL CORD/ PERIPHERAL NEUROSTIMULATOR PULSE GENERATOR/ TRANSMITTER WITH INTRA OPERATIVE/ SUBSEQUENT PROGRAMING    Location: MAIN OR 07 / Main OR/Periop    Surgeons: Evelina Bucy, MD          Physical Assessment  Vital Signs (last filed in past 24 hours):  BP: 131/102 (03/01 1110)  Pulse: 76 (03/01 1110)  Respirations: 16 PER MINUTE (03/01 1110)  SpO2: 96 % (03/01 1110)  O2 Device: None (Room air) (03/01 1110)  Height: 180.3 cm (5' 11) (03/01 1110)  Weight: 110 kg (242 lb 8.1 oz) (03/01 1110)  SpO2 Pulse: 86 (03/01 1110)      Patient History   Allergies   Allergen Reactions   ? Clarithromycin RASH     Allergy recorded in SMS: Biaxin~Reactions: RASH~THRUSH   ? Ketoconazole SEE COMMENTS     Had bloodshot eyes after use, and sore.   ? Lisinopril COUGH   ? Oxycodone UNKNOWN     Nightmare        Current Medications    Medication Directions   acetaminophen (TYLENOL) 325 mg tablet Take two tablets by mouth every 4 hours as needed for Pain.   albuterol (VENTOLIN HFA, PROAIR HFA) 90 mcg/actuation inhaler Inhale 2 Puffs by mouth four times daily as needed for Wheezing.   aspirin 81 mg chewable tablet Take 1 Tab by mouth daily. WAIT to restart until finished taking lovenox injections.  Patient taking differently: Chew 81 mg by mouth at bedtime daily.   carbidopa/levodopa (SINEMET) 25/100 mg tablet Take one tablet by mouth three times daily.   cetirizine (ZYRTEC) 10 mg tablet Take 10 mg by mouth daily.   cholecalciferol (Vitamin D3) (VITAMIN D-3) 1,000 units tablet Take 1,000 Units by mouth twice daily.     dextromethorphan/guaiFENesin (MUCINEX DM) 30/600 mg Tb12 Take 2 tablets by mouth daily as needed.   docusate (COLACE) 100 mg capsule Take 200 mg by mouth at bedtime daily. Pt is taking 550 mg daily   ezetimibe (ZETIA) 10 mg tablet Take one tablet by mouth at bedtime daily.   famotidine (PEPCID) 40 mg tablet Take 40 mg by mouth at bedtime daily.   fexofenadine(+) (ALLEGRA) 180 mg tablet Take 180 mg by mouth daily.   fluticasone-umeclidin-vilanter (TRELEGY ELLIPTA) 100-62.5-25 mcg inhaler Inhale 1 Dose by mouth into the lungs at bedtime daily.   furosemide (LASIX) 40 mg tablet TAKE 2 TABLETS ONCE DAILY, TAKE 3 TABLETS ON Monday/WEDNESDAY/FRIDAY AS DIRECTED BY       CARDIOLOGY   gabapentin (NEURONTIN) 300 mg capsule Take one capsule by mouth every 6 hours.   glucosamine su 2KCl-chondroit 500-400 mg tab Take 1 Tab by mouth twice daily.   ipratropium bromide (ATROVENT) 42 mcg (0.06 %) nasal spray Apply 2 sprays to each nostril as directed twice daily as needed.   losartan (COZAAR) 50 mg tablet Take one tablet by mouth daily.   metoprolol XL (  TOPROL XL) 100 mg extended release tablet Take one tablet by mouth daily.   mucus clearing device (AEROBIKA OSCILLATING PEP SYSTM MISC) Use  as directed. Use with salt water and inhale twice daily   MYRBETRIQ 50 mg tablet Take 1 tablet by mouth daily. Does not take on days he takes Ditropan   OMEGA-3 FATTY ACIDS-FISH OIL PO Take 1 capsule by mouth twice daily.   other medication mupirocin ointment and budesonide solution mixed with warm water  Use as nasal irrigation twice daily   oxybutynin chloride (DITROPAN) 5 mg tablet Take 5 mg by mouth twice daily. Does not take on days he takes Myrbetriq   pantoprazole DR (PROTONIX) 40 mg tablet Take 40 mg by mouth twice daily.   polyethylene glycol 3350 (GLYCOLAX; MIRALAX) 17 gram/dose powder Take 17 g by mouth daily.  Patient taking differently: Take 17 g by mouth nightly as needed.   potassium chloride SR (K-DUR) 20 mEq tablet Take one tab on Sunday, Tuesday, Thursday, and Saturday.  Take two tabs on Mon, Wed and Friday.   pyRIDostigmine bromide (MESTINON) 60 mg tablet Take 60 mg by mouth three times daily. 1.5 tablets 3 times daily   rosuvastatin (CRESTOR) 40 mg tablet TAKE 1 TABLET BY MOUTH AT BEDTIME   sildenafil(+) (VIAGRA) 100 mg tablet Take 1 Tab by mouth as Needed for Erectile dysfunction.   spironolactone (ALDACTONE) 25 mg tablet TAKE 1 TABLET BY MOUTH EVERY DAY TAKE WITH FOOD   tamsulosin (FLOMAX) 0.4 mg capsule Take 0.4 mg by mouth daily. Do not crush, chew or open capsules. Take 30 minutes following the same meal each day.   testosterone cypionate 200 mg/mL kit Inject 1 mL into the muscle every 14 days.   tiZANidine (ZANAFLEX) 2 mg tablet Take three tablets by mouth at bedtime as needed. 1 to 3 tabs qhs prn   traMADoL (ULTRAM) 50 mg tablet every 4-6 hours   traZODone (DESYREL) 100 mg tablet bedtime   Vacuum Erection Device System kit Use as directed for sexual activity.   XARELTO 20 mg tablet TAKE 1 TABLET BY MOUTH DAILY - MUST BE WITH EVENING MEAL         Review of Systems/Medical History      Patient summary reviewed  Pertinent labs reviewed    PONV Screening: Non-smoker  No history of anesthetic complications  No family history of anesthetic complications      Airway - negative        Pulmonary      Not a current smoker (never)        Asthma    COPD (trelegy daily, albuterol prn - uses rarely. )        Recent URI:          Obstructive Sleep Apnea          Interventions: CPAP; compliant      Follows with Dr. Charisse Klinefelter pulmonology at [facility]      Cardiovascular       Recent diagnostic studies:          ECG          10/17/18 EKG: Atrial paced rhythm. Borderline prolonged pr interval. Nonspecific IVCD    06/11/18 Echo (outside records)  Left ventricular systolic function is mildly reduced, LVEF 50 to 55% and stage II diastolic dysfunction with elevated mean left atrial pressure  Abnormal septal motion consistent with RV pacemaker  Reduced right ventricular systolic function and normal right ventricular  diastolic function  Mild left atrial enlargement and mild right atrial enlargement  Status post mitral annular ring insertion.  The mean diastolic gradient across the mitral valve is 2 mmHg at a heart rate of 88 bpm.  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  There is no obvious right to left shunt at rest, with cough or with Valsalva on agitated saline contrast examination.      01/15/2018 MPI stress (outside records)  No diagnostic stress ECG changes with regadenson infusion.  No diagnostic ST shifts were noted.  No clear evidence of scan ischemia.  Normal LV wall motion and function.  Calculated poststress LV ejection fraction of 57%.      Exercise tolerance: unknown (swims for exercise twice weekly x 60 min at slow rate. denies CP/SOB)       Beta Blocker therapy: Yes      Beta blockers within 24 hours: Yes        CIED              [employer]              Device Indication(s):   AFib          not pacemaker dependent                                  Device settings:  AAIR and DDDR              Battery life > 1 year                            Hypertension, well controlled      Valvular problems/murmurs (mitral valve repair 2006)        Past MI:        Coronary artery disease      Coronary artery bypass graft (5 vessel 2006)      No palpitations        Dysrhythmias (eliquis); atrial fibrillation      No angina      CHF (Ischemic Cardiomyopathy)      Hyperlipidemia (statin)      Dyspnea on exertion      81 mg ASA daily        GI/Hepatic/Renal         Hiatal hernia        GERD (PPI, H2B, elevated HOB), well controlled      No liver disease:        Renal disease (pt denies. CKD stage II per outsdie records):        Neuro/Psych         Parkinson's Disease      Musculoskeletal         Back pain (chronic LBP)      Arthritis:       Endocrine/Other       Diabetes (pre diabetic per patient), well controlled      Obesity      Constitution - negative   Physical Exam    Airway Findings      Mallampati: II      TM distance: >3 FB      Neck ROM: limited      Mouth opening: good      Airway patency:  adequate    Dental Findings: Negative      Cardiovascular Findings:       Rhythm: regular      Rate: normal      No murmur, no peripheral edema    Pulmonary Findings:    Decreased breath sounds (in bases only).    Abdominal Findings:       Obese    Neurological Findings:       Alert and oriented x 3    Constitutional findings:       No acute distress      Well-developed    Other Findings: PPM to left chest       Diagnostic Tests  Hematology:   Lab Results   Component Value Date    HGB 14.7 06/09/2020    HCT 47.1 06/09/2020    PLTCT 164 06/09/2020    WBC 14.9 06/09/2020    NEUT 92 11/22/2018    ANC 13.00 11/22/2018    ALC 0.70 11/22/2018    MONA 3 11/22/2018    AMC 0.50 11/22/2018    EOSA 0 11/22/2018    ABC 0.00 11/22/2018    MCV 84.6 06/09/2020    MCH 26.4 06/09/2020    MCHC 31.2 06/09/2020    MPV 8.0 11/23/2018    RDW 16.4 06/09/2020         General Chemistry:   Lab Results   Component Value Date    NA 139 03/31/2021    K 4.0 03/31/2021    CL 100 03/31/2021    CO2 29 03/31/2021    GAP 10 03/31/2021    BUN 33 03/31/2021    CR 1.30 03/31/2021    GLU 102 03/31/2021    GLU 123 03/15/2006    CA 8.7 03/31/2021    ALBUMIN 3.2 11/22/2018    OBSCA 1.08 04/15/2005    MG 1.9 07/10/2019    TOTBILI 0.8 11/22/2018    PO4 3.0 12/14/2009      Coagulation:   Lab Results   Component Value Date    PT 13.5 03/15/2006    PTT 31.0 08/29/2014    INR 1.5 11/25/2018    INR 1.6 08/24/2018         Anesthesia Plan    ASA score: 3   Plan: MAC  Induction method: intravenous  NPO status: acceptable      Informed Consent  Anesthetic plan and risks discussed with patient.  Use of blood products discussed with patient  Blood Consent: consented      Plan discussed with: anesthesiologist and CRNA.  Comments: (Patient prefers MAC. Will proceed with that plan.)

## 2021-11-17 NOTE — Anesthesia Post-Procedure Evaluation
Post-Anesthesia Evaluation    Name: Johnny [name]      MRN: [id number]     DOB: 1945/11/11     Age: 76 y.o.     Sex: male   __________________________________________________________________________     Procedure Information     Anesthesia Start Date/Time: 11/17/21 1211    Procedures:       INSERTION SPINAL NEUROSTIMULATOR PULSE GENERATOR/ RECEIVER - c-arm, jackson table, 1.5 hrs      PERCUTANEOUS IMPLANTATION EPIDURAL NEUROSTIMULATOR ELECTRODE ARRAY x2      ELECTRONIC ANALYSIS IMPLANTED NEUROSTIMULATOR PULSE GENERATOR SYSTEM - COMPLEX SPINAL CORD/ PERIPHERAL NEUROSTIMULATOR PULSE GENERATOR/ TRANSMITTER WITH INTRA OPERATIVE/ SUBSEQUENT PROGRAMING    Location: MAIN OR 07 / Main OR/Periop    Surgeons: Lelon Huh, MD          Post-Anesthesia Vitals  BP: 116/67 (03/01 1415)  Pulse: 72 (03/01 1415)  Respirations: 18 PER MINUTE (03/01 1415)  SpO2: 96 % (03/01 1415)  SpO2 Pulse: 72 (03/01 1415)  O2 Device: None (Room air) (03/01 1415)   Vitals Value Taken Time   BP 116/67 11/17/21 1415   Temp 36.9 C (98.5 F) 11/17/21 1326   Pulse 72 11/17/21 1415   Respirations 18 PER MINUTE 11/17/21 1415   SpO2 96 % 11/17/21 1415   O2 Device None (Room air) 11/17/21 1415   ABP     ART BP           Post Anesthesia Evaluation Note    Evaluation location: Pre/Post  Patient participation: recovered; patient participated in evaluation  Level of consciousness: alert  Pain management: adequate    Hydration: normovolemia  Temperature: 36.0C - 38.4C  Airway patency: adequate    Perioperative Events       Post-op nausea and vomiting: no PONV    Postoperative Status  Cardiovascular status: hemodynamically stable  Respiratory status: spontaneous ventilation  Follow-up needed: none        Perioperative Events  There were no known notable events for this encounter.

## 2021-11-19 ENCOUNTER — Encounter: Admit: 2021-11-19 | Discharge: 2021-11-19 | Payer: MEDICARE

## 2021-11-19 DIAGNOSIS — M549 Dorsalgia, unspecified: Secondary | ICD-10-CM

## 2021-11-19 DIAGNOSIS — N529 Male erectile dysfunction, unspecified: Secondary | ICD-10-CM

## 2021-11-19 DIAGNOSIS — T148XXA Other injury of unspecified body region, initial encounter: Secondary | ICD-10-CM

## 2021-11-19 DIAGNOSIS — I2510 Atherosclerotic heart disease of native coronary artery without angina pectoris: Secondary | ICD-10-CM

## 2021-11-19 DIAGNOSIS — R053 Chronic cough: Secondary | ICD-10-CM

## 2021-11-19 DIAGNOSIS — Z95810 Presence of automatic (implantable) cardiac defibrillator: Secondary | ICD-10-CM

## 2021-11-19 DIAGNOSIS — K219 Gastro-esophageal reflux disease without esophagitis: Secondary | ICD-10-CM

## 2021-11-19 DIAGNOSIS — K449 Diaphragmatic hernia without obstruction or gangrene: Secondary | ICD-10-CM

## 2021-11-19 DIAGNOSIS — I219 Acute myocardial infarction, unspecified: Secondary | ICD-10-CM

## 2021-11-19 DIAGNOSIS — I10 Essential (primary) hypertension: Secondary | ICD-10-CM

## 2021-11-19 DIAGNOSIS — I340 Nonrheumatic mitral (valve) insufficiency: Secondary | ICD-10-CM

## 2021-11-19 DIAGNOSIS — E785 Hyperlipidemia, unspecified: Secondary | ICD-10-CM

## 2021-11-19 DIAGNOSIS — I4891 Unspecified atrial fibrillation: Secondary | ICD-10-CM

## 2021-11-19 DIAGNOSIS — E669 Obesity, unspecified: Secondary | ICD-10-CM

## 2021-11-19 DIAGNOSIS — R7303 Prediabetes: Secondary | ICD-10-CM

## 2021-11-19 DIAGNOSIS — J449 Chronic obstructive pulmonary disease, unspecified: Secondary | ICD-10-CM

## 2021-11-19 DIAGNOSIS — J302 Other seasonal allergic rhinitis: Secondary | ICD-10-CM

## 2021-11-19 DIAGNOSIS — I255 Ischemic cardiomyopathy: Secondary | ICD-10-CM

## 2021-11-19 DIAGNOSIS — G5600 Carpal tunnel syndrome, unspecified upper limb: Secondary | ICD-10-CM

## 2021-11-19 DIAGNOSIS — I509 Heart failure, unspecified: Secondary | ICD-10-CM

## 2021-11-19 DIAGNOSIS — G4733 Obstructive sleep apnea (adult) (pediatric): Secondary | ICD-10-CM

## 2021-11-19 DIAGNOSIS — M1990 Unspecified osteoarthritis, unspecified site: Secondary | ICD-10-CM

## 2021-11-25 NOTE — Patient Instructions
General Instructions:  How to reach me: Please send a MyChart message to the Spine Center or leave a voicemail for my nurse Rebecca at [phone].  Scheduling: Our scheduling phone number is [phone].  How to get a medication refill: Five business days before refill needed, please use the MyChart Refill request or contact your pharmacy directly to request medication refills.   How to receive your test results: If you have signed up for MyChart, you will receive your test results and messages from me this way. Otherwise, you will get a phone call or letter. If you are expecting results and have not heard from my office within 2 weeks of your testing, please send a MyChart message or call my office.  Support for many chronic illnesses is available through [facility]: [email or web address] or [phone].  For questions on nights, weekends or holidays, call the operator at [phone], and ask for the doctor on call for Anesthesia Pain Management.

## 2021-11-26 ENCOUNTER — Encounter: Admit: 2021-11-26 | Discharge: 2021-11-26 | Payer: MEDICARE

## 2021-11-26 ENCOUNTER — Ambulatory Visit: Admit: 2021-11-26 | Discharge: 2021-11-26 | Payer: MEDICARE

## 2021-11-26 DIAGNOSIS — M5417 Radiculopathy, lumbosacral region: Secondary | ICD-10-CM

## 2021-11-26 DIAGNOSIS — Z9689 Presence of other specified functional implants: Secondary | ICD-10-CM

## 2021-11-26 NOTE — Progress Notes
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    Patient reports mild incisional pain, yet well managed with OTC tylenol, patient wishes to avoid opioid therapy due to constipation.    Patient reports taking postop ABX as prescribed, denies any SEs.    Denies fevers, chills, or incisional swelling.    Denies changes in BLE strength.  Denies loss of bowel/bladder function.        Review of Systems   Musculoskeletal: Positive for back pain and myalgias.       Current Outpatient Medications:   ?  acetaminophen (TYLENOL) 325 mg tablet, Take two tablets by mouth every 4 hours as needed for Pain., Disp: 300 tablet, Rfl: 1  ?  albuterol (VENTOLIN HFA, PROAIR HFA) 90 mcg/actuation inhaler, Inhale two puffs by mouth into the lungs four times daily as needed for Wheezing., Disp: , Rfl:   ?  aspirin 81 mg chewable tablet, Take 1 Tab by mouth daily. WAIT to restart until finished taking lovenox injections. (Patient taking differently: Chew one tablet by mouth at bedtime daily.), Disp: 90 Tab, Rfl: 3  ?  carbidopa/levodopa (SINEMET) 25/100 mg tablet, Take one tablet by mouth three times daily., Disp: 270 tablet, Rfl: 0  ?  cephalexin (KEFLEX) 500 mg capsule, Take one capsule by mouth four times daily., Disp: 28 capsule, Rfl: 0  ?  cetirizine (ZYRTEC) 10 mg tablet, Take one tablet by mouth daily., Disp: , Rfl:   ?  cholecalciferol (Vitamin D3) (VITAMIN D-3) 1,000 units tablet, Take one tablet by mouth twice daily.  , Disp: , Rfl:   ?  dextromethorphan/guaiFENesin (MUCINEX DM) 30/600 mg Tb12, Take two tablets by mouth daily as needed., Disp: , Rfl:   ?  docusate (COLACE) 100 mg capsule, Take two capsules by mouth at bedtime daily. Pt is taking 550 mg daily, Disp: , Rfl:   ?  ezetimibe (ZETIA) 10 mg tablet, Take one tablet by mouth at bedtime daily., Disp: 90 tablet, Rfl: 3  ?  famotidine (PEPCID) 40 mg tablet, Take one tablet by mouth at bedtime daily., Disp: , Rfl:   ?  fexofenadine(+) (ALLEGRA) 180 mg tablet, Take one tablet by mouth daily., Disp: , Rfl:   ?  fluticasone-umeclidin-vilanter (TRELEGY ELLIPTA) 100-62.5-25 mcg inhaler, Inhale 1 Dose by mouth into the lungs at bedtime daily., Disp: , Rfl:   ?  furosemide (LASIX) 40 mg tablet, TAKE 2 TABLETS ONCE DAILY, TAKE 3 TABLETS ON Monday/WEDNESDAY/FRIDAY AS DIRECTED BY       CARDIOLOGY, Disp: 270 tablet, Rfl: 3  ?  gabapentin (NEURONTIN) 300 mg capsule, Take one capsule by mouth every 6 hours., Disp: 270 capsule, Rfl: 3  ?  glucosamine su 2KCl-chondroit 500-400 mg tab, Take 1 Tab by mouth twice daily., Disp: , Rfl:   ?  ipratropium bromide (ATROVENT) 42 mcg (0.06 %) nasal spray, Apply two sprays to each nostril as directed twice daily as needed., Disp: , Rfl:   ?  losartan (COZAAR) 50 mg tablet, Take one tablet by mouth daily., Disp: 90 tablet, Rfl: 3  ?  metoprolol XL (TOPROL XL) 100 mg extended release tablet, Take one tablet by mouth daily., Disp: 90 tablet, Rfl: 3  ?  mucus clearing device (AEROBIKA OSCILLATING PEP SYSTM MISC), Use  as directed. Use with salt water and inhale twice daily, Disp: , Rfl:   ?  MYRBETRIQ 50 mg tablet, Take one tablet by mouth daily. Does not take on days he takes Ditropan, Disp: , Rfl:   ?  OMEGA-3 FATTY ACIDS-FISH OIL PO, Take 1 capsule by mouth twice daily., Disp: , Rfl:   ?  other medication, mupirocin ointment and budesonide solution mixed with warm water Use as nasal irrigation twice daily, Disp: , Rfl:   ?  oxybutynin chloride (DITROPAN) 5 mg tablet, Take one tablet by mouth twice daily. Does not take on days he takes Myrbetriq, Disp: , Rfl:   ?  pantoprazole DR (PROTONIX) 40 mg tablet, Take one tablet by mouth twice daily., Disp: , Rfl:   ?  polyethylene glycol 3350 (GLYCOLAX; MIRALAX) 17 gram/dose powder, Take 17 g by mouth daily. (Patient taking differently: Take seventeen g by mouth nightly as needed.), Disp: 595 g, Rfl: 3  ?  potassium chloride SR (K-DUR) 20 mEq tablet, Take one tab on Sunday, Tuesday, Thursday, and Saturday.  Take two tabs on Mon, Wed and Friday., Disp: 140 tablet, Rfl: 3  ?  pyRIDostigmine bromide (MESTINON) 60 mg tablet, Take one tablet by mouth three times daily. 1.5 tablets 3 times daily, Disp: , Rfl:   ?  rosuvastatin (CRESTOR) 40 mg tablet, TAKE 1 TABLET BY MOUTH AT BEDTIME, Disp: 90 tablet, Rfl: 3  ?  sildenafil(+) (VIAGRA) 100 mg tablet, Take 1 Tab by mouth as Needed for Erectile dysfunction., Disp: 6 Tab, Rfl: 12  ?  spironolactone (ALDACTONE) 25 mg tablet, TAKE 1 TABLET BY MOUTH EVERY DAY TAKE WITH FOOD, Disp: 90 tablet, Rfl: 3  ?  tamsulosin (FLOMAX) 0.4 mg capsule, Take one capsule by mouth daily. Do not crush, chew or open capsules. Take 30 minutes following the same meal each day., Disp: , Rfl:   ?  testosterone cypionate 200 mg/mL kit, Inject 1 mL into the muscle every 14 days., Disp: , Rfl:   ?  tiZANidine (ZANAFLEX) 2 mg tablet, Take three tablets by mouth at bedtime as needed. 1 to 3 tabs qhs prn, Disp: 90 tablet, Rfl: 3  ?  traMADoL (ULTRAM) 50 mg tablet, Take one tablet by mouth every 8 hours as needed for Pain., Disp: 30 tablet, Rfl: 0  ?  traMADoL (ULTRAM) 50 mg tablet, every 4-6 hours, Disp: , Rfl:   ?  traZODone (DESYREL) 100 mg tablet, bedtime, Disp: , Rfl:   ?  Vacuum Erection Device System kit, Use as directed for sexual activity., Disp: 1 kit, Rfl: 11  ?  XARELTO 20 mg tablet, TAKE 1 TABLET BY MOUTH DAILY - MUST BE WITH EVENING MEAL, Disp: , Rfl:   Allergies   Allergen Reactions   ? Clarithromycin RASH     Allergy recorded in SMS: Biaxin~Reactions: RASH~THRUSH   ? Ketoconazole SEE COMMENTS     Had bloodshot eyes after use, and sore.   ? Lisinopril COUGH   ? Oxycodone UNKNOWN     Nightmare       Physical Exam  General: Alert, oriented, no acute distress  Resp: Non labored breathing, no distress  Skin: incisions healing well, well approximated, no sx of infection, drainage, erythema or swelling noted. Mild bruising noted below IPG incision.        Vitals:    11/26/21 1244   BP: (P) 102/58   Pulse: (P) 80  SpO2: (P) 98%   PainSc: Five   Weight: 111.1 kg (245 lb)   Height: 180.3 cm (5' 11)        Pain Score: Five  Body mass index is 34.17 kg/m?Marland Kitchen           IMPRESSION:  1. Lumbosacral radiculopathy    2. Spinal cord stimulator status          PLAN:   Dressing change, staples (s) removed without difficulty  Mastisol, steri-strips, gauze and tegaderm applied over each incision.   Pt instructed to keep incision dry, may begin showers in 2 days, but no bathing or submerging for at least 3 weeks  Stim representative present to activate and program stimulator, education provided   Encouraged to continue activity restriction to no heavy lifting greater than 10lbs, limit twisting and excessive bending of the spine for 4-6 weeks.   Follow up in 3 weeks for wound check and stim eval.

## 2021-11-30 ENCOUNTER — Encounter: Admit: 2021-11-30 | Discharge: 2021-11-30 | Payer: MEDICARE

## 2021-12-06 ENCOUNTER — Encounter: Admit: 2021-12-06 | Discharge: 2021-12-06 | Payer: MEDICARE

## 2021-12-08 ENCOUNTER — Encounter: Admit: 2021-12-08 | Discharge: 2021-12-08 | Payer: MEDICARE

## 2021-12-08 MED ORDER — METOPROLOL SUCCINATE 100 MG PO TB24
ORAL_TABLET | 2 refills
Start: 2021-12-08 — End: ?

## 2021-12-13 ENCOUNTER — Encounter: Admit: 2021-12-13 | Discharge: 2021-12-13 | Payer: MEDICARE

## 2021-12-17 ENCOUNTER — Encounter: Admit: 2021-12-17 | Discharge: 2021-12-17 | Payer: MEDICARE

## 2021-12-17 ENCOUNTER — Ambulatory Visit: Admit: 2021-12-17 | Discharge: 2021-12-17 | Payer: MEDICARE

## 2021-12-17 DIAGNOSIS — M5137 Other intervertebral disc degeneration, lumbosacral region: Secondary | ICD-10-CM

## 2021-12-17 DIAGNOSIS — M5417 Radiculopathy, lumbosacral region: Secondary | ICD-10-CM

## 2021-12-17 DIAGNOSIS — M25561 Pain in right knee: Secondary | ICD-10-CM

## 2021-12-17 DIAGNOSIS — Z9689 Presence of other specified functional implants: Secondary | ICD-10-CM
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
## 2021-12-21 ENCOUNTER — Encounter: Admit: 2021-12-21 | Discharge: 2021-12-21 | Payer: MEDICARE

## 2021-12-21 MED ORDER — CARBIDOPA-LEVODOPA 25-100 MG PO TAB
1 | ORAL_TABLET | Freq: Three times a day (TID) | ORAL | 0 refills | Status: AC
Start: 2021-12-21 — End: ?

## 2021-12-21 NOTE — Telephone Encounter
Received refill request for carbidopa-levodopa.  Medication is listed to continue under plan of care from last visit on 10/13/21.  Patient has been seen in the last year and has a future appointment scheduled.  Refills authorized with neurologist to co-sign.

## 2021-12-28 ENCOUNTER — Encounter: Admit: 2021-12-28 | Discharge: 2021-12-28 | Payer: MEDICARE

## 2021-12-28 MED ORDER — APIXABAN 5 MG PO TAB
5 mg | ORAL_TABLET | Freq: Two times a day (BID) | ORAL | 3 refills | Status: AC
Start: 2021-12-28 — End: ?

## 2021-12-30 ENCOUNTER — Ambulatory Visit: Admit: 2021-12-30 | Discharge: 2021-12-31 | Payer: MEDICARE

## 2021-12-30 ENCOUNTER — Encounter: Admit: 2021-12-30 | Discharge: 2021-12-30 | Payer: MEDICARE

## 2021-12-30 VITALS — BP 111/71 | HR 85 | Temp 97.30000°F

## 2021-12-30 DIAGNOSIS — I219 Acute myocardial infarction, unspecified: Secondary | ICD-10-CM

## 2021-12-30 DIAGNOSIS — N529 Male erectile dysfunction, unspecified: Secondary | ICD-10-CM

## 2021-12-30 DIAGNOSIS — Z95810 Presence of automatic (implantable) cardiac defibrillator: Secondary | ICD-10-CM

## 2021-12-30 DIAGNOSIS — K219 Gastro-esophageal reflux disease without esophagitis: Secondary | ICD-10-CM

## 2021-12-30 DIAGNOSIS — M549 Dorsalgia, unspecified: Secondary | ICD-10-CM

## 2021-12-30 DIAGNOSIS — R7303 Prediabetes: Secondary | ICD-10-CM

## 2021-12-30 DIAGNOSIS — J449 Chronic obstructive pulmonary disease, unspecified: Secondary | ICD-10-CM

## 2021-12-30 DIAGNOSIS — M1990 Unspecified osteoarthritis, unspecified site: Secondary | ICD-10-CM

## 2021-12-30 DIAGNOSIS — R053 Chronic cough: Secondary | ICD-10-CM

## 2021-12-30 DIAGNOSIS — I340 Nonrheumatic mitral (valve) insufficiency: Secondary | ICD-10-CM

## 2021-12-30 DIAGNOSIS — J302 Other seasonal allergic rhinitis: Secondary | ICD-10-CM

## 2021-12-30 DIAGNOSIS — I509 Heart failure, unspecified: Secondary | ICD-10-CM

## 2021-12-30 DIAGNOSIS — G4733 Obstructive sleep apnea (adult) (pediatric): Secondary | ICD-10-CM

## 2021-12-30 DIAGNOSIS — I4891 Unspecified atrial fibrillation: Secondary | ICD-10-CM

## 2021-12-30 DIAGNOSIS — T148XXA Other injury of unspecified body region, initial encounter: Secondary | ICD-10-CM

## 2021-12-30 DIAGNOSIS — E785 Hyperlipidemia, unspecified: Secondary | ICD-10-CM

## 2021-12-30 DIAGNOSIS — I2510 Atherosclerotic heart disease of native coronary artery without angina pectoris: Secondary | ICD-10-CM

## 2021-12-30 DIAGNOSIS — I255 Ischemic cardiomyopathy: Secondary | ICD-10-CM

## 2021-12-30 DIAGNOSIS — E669 Obesity, unspecified: Secondary | ICD-10-CM

## 2021-12-30 DIAGNOSIS — G5600 Carpal tunnel syndrome, unspecified upper limb: Secondary | ICD-10-CM

## 2021-12-30 DIAGNOSIS — K449 Diaphragmatic hernia without obstruction or gangrene: Secondary | ICD-10-CM

## 2021-12-30 DIAGNOSIS — R350 Frequency of micturition: Principal | ICD-10-CM

## 2021-12-30 DIAGNOSIS — I10 Essential (primary) hypertension: Secondary | ICD-10-CM

## 2021-12-30 NOTE — Patient Instructions
What is Overactive Bladder (OAB)?  http://urologyhealth.org/urologic-conditions/overactive-bladder-([email or web address]      Overactive bladder (OAB) is the name for a group of urinary symptoms. It is not a disease. The most common symptom is a sudden, uncontrolled need or urge to urinate. Some people will leak urine when they feel this urge. Another symptom is the need to pass urine many times during the day and night. OAB is basically the feeling that you?ve ?gotta? go? to the bathroom urgently and too much.    Leaking urine is called incontinence?. Stress urinary incontinence (SUI), is another common bladder problem. It?s different from OAB. People with SUI leak urine while sneezing, laughing or doing other physical activities. More information on SUI can be found at ForexFest.no.    [employer]  As many as 30 percent of men and 40 percent of women in the [place] live with OAB symptoms. Many people living with OAB don't ask for help. They may feel embarrassed. Many people either don't know how to talk with their health care provider about their symptoms, or they think there aren't treatments that can help.    The truth is there are many treatments that can help. Asking your health care provider about it is the first step.    How OAB Can Affect Your Life  OAB can get in the way of your work, social life, exercise and sleep. Without treatment, OAB symptoms can make it hard to get through the day without many trips to the bathroom. You may not want to go out with friends or go far from home because you're afraid of being far from a bathroom. This makes many people feel lonely and isolated.    OAB may affect relationships with friends and family. It can disrupt your sleep and sex-life. Too little sleep will leave anyone tired and depressed. In addition, if you leak urine, you may develop skin problems or infections.    You don't have to let OAB rule your life. OAB can be controlled. If you think you have OAB, see your health care provider.    The Truth About OAB  OAB is not a normal part of getting older.  OAB is not just part of being a woman.  OAB is not just an issue with the prostate.  OAB is not caused by something you did.  Surgery is not the only treatment for OAB.  There are treatments to help people manage OAB symptoms.  There are treatments to help even minor OAB symptoms.    If you are bothered by OAB symptoms, then you should ask for treatment!    Symptoms  Urgency: The major symptom of OAB is a sudden, strong urge to urinate that you can't ignore. This gotta go feeling makes you fear you will leak if you don't get to a bathroom right away. You may or may not actually leak with this urge to go.    If you live with OAB, you may also:  Leak urine or have ?urge incontinence.? This means urine leaks when you feel the sudden urge to go. This isn?t the same as stress urinary incontinence or SUI . People with SUI leak urine when sneezing, laughing or doing other physical activities.    Urinate frequently. You may need to go to the bathroom many times during the day. The number of times someone urinates varies from person to person. Many experts agree that going to the bathroom more than eight times in 24 hours is ?frequent urination.?  Wake up at night to pass urine. If you have to wake from sleep to go to the bathroom more than once a night, it?s a symptom of OAB or nocturia.    Causes  How the Urinary Tract Works Normally, and What Causes OAB  The urinary tract   is the important system in our bodies that removes liquid waste (urine). It includes the organs that produce, store and pass urine. These are:    Kidneys: two bean-shaped organs that clean waste from the blood and make urine.    Ureters: two thin tubes that take urine from the kidney to the bladder.    Bladder: a balloon-like muscular sac that holds urine until it?s time to go to the bathroom.    Urethra: the tube that carries urine from the bladder out of the body. The urethra has a muscle called a sphincter that locks in urine.    The sphincter muscle opens to release urine when the bladder contracts.    Normally, when your bladder is full of urine waste, your brain signals the bladder. The bladder muscles then squeeze. This forces the urine out through the urethra. The sphincter in the urethra opens and urine flows out. When your bladder is not full, the bladder is relaxed.    With a healthy bladder, signals in your brain let you know that your bladder is getting full or is full, but you can wait to go to the bathroom. With OAB, you can?t wait. You feel a sudden, urgent need to go. This can happen even if your bladder isn?t full.    If the nerve signals between your bladder and brain don?t work properly, OAB can result. The signals might tell your bladder to empty, even when it isn't full. OAB can also be caused when muscles in your bladder are too active. This means that the bladder muscles contract to pass urine before your bladder is full. In turn, this causes a sudden, strong need to urinate. We call this urgency.        Risk Factors for OAB  Neurologic disorders or damage to the signals between your brain and bladder  Hormone changes  Pelvic muscle weakness or spasms  A urinary tract infection  Side effects from a medication  Diseases that affect the brain or spinal cord, like stroke and multiple sclerosis  If you think you have OAB, talk with your health care provider. It?s important to learn why it?s happening so you can manage your symptoms.    Diagnosis  After you talk about your symptoms, your health care provider may do an exam right away. Or, they may refer you to a specialist, such as a urologist who can diagnose and treat OAB. Some urologists specialize in incontinence and OAB.    Medical History  Your exam will begin with questions. Your provider will want to understand your health history and experiences. You should tell them about the symptoms you have, how long you?ve had them, and how they?re changing your life. A medical history will include questions about your past and current health problems. You should bring a list of over-the-counter and prescription drugs you take. You should also tell your provider about your diet and about how much and what kinds of liquids you drink during the day and night.    Physical Exam  Your provider will examine you to look for something that may be causing your symptoms. Doctors will often feel your abdomen, the organs in your  pelvis, and your rectum.    Bladder Diary  You may be asked to keep a Bladder Diaryfor a few weeks. With this, you will note how often you go to the bathroom and any time you leak urine. This will help your health care provider learn more about your day-to-day symptoms. The bladder diary helps you track:    When and how much fluid you drink  When and how much you urinate  How often you have that ?gotta go? urgency feeling  When and how much urine you may leak  Having a Bladder Diary during your first visit can be helpful because it describes your daily habits, your urinary symptoms, and shows your provider how they affect your life. Your doctor will use this information to help treat you.    Other Tests  Urine test: Your health care provider may ask you to leave a sample of your urine to test for infection or blood.  Bladder scan: This type of ultrasound shows how much urine is still in the bladder after you go to the bathroom.  More tests,like a cystoscopy or urodynamic testing, are usually not needed but may be used if your provider thinks something else is going on.    Treatment  There are a number of things you can do to manage OAB. Everyone has a different experience with what works best. Bonita Quin may try one treatment alone, or several at the same time.    You and your health care provider should talk about what you want from treatment and about each option. OAB treatments include:    Lifestyle Changes  Prescription Medications  Bladder Botox? (botulinum toxin) Treatments  Nerve Stimulation (peripheral and central)  Surgery    Lifestyle Changes  For OAB treatment, health care providers may first ask a patient to make lifestyle changes. These changes may also be called behavioral therapy. This could mean you eat different foods, change drinking habits, and pre-plan bathroom visits to feel better. Many people find these changes help.    Other people need to do more, such as:  1. Limit food and drinks that bother the bladder. There are certain foods and drinks known to irritate the bladder. You can start by avoiding diuretics - these drinks include caffeine and alcohol which encourage your body to make more urine. You can also try taking several foods out of your diet, and then add them back one at a time. This will show you which foods make your symptoms worse, so you can avoid them. You can add fiber to your diet to improve digestion. Oatmeal and whole grains are good. Fresh and dried fruit, vegetables, and beans may help. Many people feel better when they change the way they eat and drink.  Some foods and drinks that may affect your bladder:  Coffee/caffeine  Tea  Alcohol  Soda and other fizzy drinks  Some citrus fruits  Tomato-based foods  Chocolate (not white chocolate)  Some spicy foods  2. Keep a bladder diary. Writing down when you make trips to the bathroom for a few days can help you understand your body better. This diary may show you things that make symptoms worse. For example, are your symptoms worse after eating or drinking a certain kind of food? Are they worse when you don?t drink enough liquids?  3. Double voiding. This is when you empty your bladder twice. This may be helpful for people who have trouble fully emptying their bladder. After you go to the  bathroom, you wait a few seconds and then try again.  4. Delayed voiding. This is when you practice waiting before you go to the bathroom, even when you have to go. At first, you wait just a few minutes. Gradually, you may be able to wait two to three hours at a time. Only try this if your health care provider tells you to. Some people feel worse or have urine leaks when they wait too long to go to the bathroom.  5. Timed urination. This means you follow a daily bathroom schedule. Instead of going when you feel the urge, you go at set times during the day. You and your health care provider will create a reasonable schedule. You may try to go every two to four hours, whether you feel you have to or not. The goal is to prevent that urgent feeling and to regain control.  6. Exercises to relax your bladder muscle.  Kegel exercises: tightening and holding your pelvic muscles tight, to strengthen the pelvic floor.  Quick flicks are when you quickly squeeze and relax your pelvic floor muscles over and over again. So, when you feel the urge to go, a number of quick flicks may help control that ?gotta go? feeling. It helps to be still, relax and focus on just the exercise. Your health care provider or a physical therapist can help you learn these exercises.  Biofeedback may also help you learn about your bladder. Biofeedback uses computer graphs and sounds to monitor muscle movement. It can help teach you how your pelvic muscles move and how strong they are.    Prescription Drugs  When lifestyle changes aren?t enough, the next step may be to take medicine. Your health care provider can tell you about special drugs for OAB.    There are several drug types that can relax the bladder muscle. These drugs, like anti-muscarinics and beta-3 agonists, can help stop your bladder from squeezing when it?s not full. Some are taken as pills, by mouth. Others are gels or a sticky transdermal patch to give you the drug through your skin.    Anti-muscarinics and betta-3 adrenoceptor agonists can relax the bladder muscle and increase the amount of urine your bladder can hold and empty. Combination drugs, like using both anti-muscarinics and - betta-3 adrenoceptor agonists together may help control OAB when one option alone isn?t working.    Your health care provider will want to know if the medicine works for you. They will check to see if you get relief or if the drug causes problems, known as side effects. Some people get dry mouth and dry eyes, constipation, or blurred vision. If one drug you try doesn't work, your health care provider may ask you to take different amounts, give you a different one to try, or have you try two types together. Lifestyle changes and medicine at the same time help many people.    Bladder Botox Treatment  If lifestyle changes and medicine aren?t working, injections may be offered. A trained urologist for men and women, or a male pelvic medicine & reconstructive surgeon ([facility]) can help with this. They may offer Bladder Botox Treatment.    Botox works for the bladder by relaxing the muscle of the bladder wall to reduce urinary urgency and urge incontinence. It can help the bladder muscles from squeezing too much. To put botulinum toxin into the bladder, your doctor will use a cystoscope passed into the bladder so the doctor can see inside the bladder.  Then, the doctor will inject tiny amounts of botulinum toxin into the bladder muscle. This procedure is performed in the office with local anesthesia. The effects of Botox last up to six months. Repeat treatments will be necessary when OAB symptoms return.    Your health care provider will want to know if the botulinum toxin treatment works for you. They will check to see if you get relief, or if you aren?t holding in too much urine. If urine is not releasing well, you may need to use a catheter temporarily.    Nerve Stimulation  Another treatment for people who need extra help is nerve stimulation, also called neuromodulation therapy. This type of treatment sends electrical pulses to nerves that share the same path for the bladder. In OAB, the nerve signals between your bladder and brain do not communicate correctly. These electrical pulses help the brain and the nerves to the bladder communicate so the bladder can function properly and improve OAB symptoms.    There are two types:  Percutaneous tibial nerve stimulation (PTNS). PTNS (peripheral)is a way to correct the nerves in your bladder. PTNS is done by placing a small electrode in your lower leg near your ankle. It sends pulses to the tibial nerve. The tibial nerve runs along your knee to nerves in your lower back. The pulses help control the signals that aren?t working right. Often, patients receive 12 treatments, depending on how it?s working.        Sacral neuromodulation (SNS). SNS (central) changes how the sacral nerve works. This nerve carries signals between the spinal cord and the bladder. Its job is to help hold and release urine. In OAB, these nerve signals aren?t doing what they should. SNS uses a bladder pacemaker to control these signals to stop OAB symptoms. SNS is a two-step surgical process. The first step is to implant an electrical wire under the skin in your lower back. This wire is first connected to a handheld pacemaker to send pulses to the sacral nerve. You and your doctor will test whether or not this pacemaker can help you. If it helps, the second step is to implant a permanent pacemaker that can regulate the nerve rhythm.          Bladder Reconstruction/Urinary Diversion Surgery  Surgery is only used in very rare and serious cases. There are two types of surgery available. Augmentation cystoplasty enlarges the bladder. Urinary diversion re-routs the flow of urine. There are many risks to these surgeries, so it is offered only when no other option can help.    More Information  Providers and Specialists Who Treat OAB  Many types of health care providers can offer basic help for OAB. Here are the types of providers you may meet:    Urologist* are surgeons who evaluate and treat problems of the urinary tract. Most urologists are very experienced with incontinence. However, not all of them specialize in treating OAB. A patient should ask if their provider specializes in treating OAB.    Gynecologists are doctors who focus on women?s health. Most are knowledgeable about incontinence, but not all are trained to treat OAB.    Male Pelvic Medicine and Reconstructive Surgery [facility]) specialists are urologists or gynecologists who are trained as experts in male pelvic health. The public often refers to [facility] specialists as male urologists or urogynecologists.    Primary Care Practitioners are doctors who can diagnose and treat common health concerns. If a primary care provider is experienced with OAB,  they will tell you your options. Or, they may refer you to a specialist, especially if lifestyle changes haven?t helped.    Internist are general doctors who may or may not be primary care providers. They will often refer to a specialist.    Nurse Practitioners (NP) are highly trained nurses, able to treat many medical problems. Some NPs specialize in issues like OAB, or they will refer you to a specialist.    Physician Assistants (PA) are professionals licensed to practice medicine with a doctor?s oversight. NPs and PAs are often part of the health care team. Many can diagnose and treat non-surgically and can help with exercises and lifestyle changes. Some specialize in issues such as OAB.    Geriatricians are doctors who treat older patients, and many are able to evaluate and treat OAB. But, not all treat OAB.    Physical Therapists are licensed health professionals who provide physical therapy. If they have special training in pelvic floor disorders, they can help with exercises and lifestyle changes for OAB.    *Typically, specialists who treat OAB and incontinence include urologists and male pelvic medicine specialists. It helps to ask if your health care provider has direct training or experience with OAB.    Use our Find-a-Urologist tool to help find a urologist near you. Simply chose ?incontinence? as a specialty for urologists with training and experience in urine leaks and OAB.    Tips for a Successful Doctor's Visit  It?s normal to feel uncomfortable when talking about OAB symptoms. Who wants to talk about bathroom problems or incontinence? Still, knowing more about OAB is the best way to take control of the problem. A little planning will give you confidence. Here are some tips to help:    Be prepared. Before your appointment, help the health care provider learn what?s going on by gathering some information. Also, be ready to take notes about what you learn. It is helpful to bring:    A list of the prescription drugs, over-the-counter medicines, vitamins and herbs you take.  A list of your past and current illnesses or injuries.  Results from the Overactive Bladder Assessment Tool, to help you discuss your symptoms.  A way to take notes about treatments.  Bring a friend. Ask a close friend or relative to go with you to the doctor. An ?appointment buddy? can help remind you of things you may forget to ask, or remind you of things the health care provider said.    Bring up the topic. If your health care provider doesn?t ask about your OAB symptoms, then bring up the topic yourself. It may not be wise to wait until the end of your visit, so you can be sure you have time for questions. If a nurse meets with you first, tell the nurse about your symptoms.    Speak freely. Share everything you?re experiencing. Your health care provider has heard it all! It?s okay to tell them about your symptoms and how they impact your daily life.    Ask questions. A visit to your health care provider is the right time to ask questions. It is best to bring your list of questions with you so you don?t forget them. We offer some good questions to ask in each section of this guide to help you.    Talking with Your Health Care Provider  Questions to Ask the Doctor about OAB  Are my symptoms from OAB or from something else?  What tests will I   need to find out if I have OAB?  What could have caused my OAB?  Can I do anything to prevent OAB symptoms?    Questions to Ask the Doctor about Treatment  What would happen if I don?t treat my OAB?  What lifestyle changes should I make?  Are there any exercises I can do to help?  Do I need to see a physical therapist?  What treatment could help my OAB?  How soon after treatment will I feel better?  What are the good and bad things that I should know about these treatments?  What problems should I call you about after I start treatment?  What happens if the first treatment doesn?t help?  Will I need treatment for the rest of my life?  Can my OAB be managed?  What are my next steps?    Questions to Ask Yourself about Symptoms  Do my symptoms make me stop doing the things I enjoy, or prevent me from going to events?  Am I afraid to be too far from a bathroom?  Have my symptoms changed my relationships with friends or family?  Do my symptoms make it hard to get a good night?s sleep?

## 2021-12-31 ENCOUNTER — Encounter: Admit: 2021-12-31 | Discharge: 2021-12-31 | Payer: MEDICARE

## 2021-12-31 DIAGNOSIS — T148XXA Other injury of unspecified body region, initial encounter: Secondary | ICD-10-CM

## 2021-12-31 DIAGNOSIS — N529 Male erectile dysfunction, unspecified: Secondary | ICD-10-CM

## 2021-12-31 DIAGNOSIS — I2510 Atherosclerotic heart disease of native coronary artery without angina pectoris: Secondary | ICD-10-CM

## 2021-12-31 DIAGNOSIS — J449 Chronic obstructive pulmonary disease, unspecified: Secondary | ICD-10-CM

## 2021-12-31 DIAGNOSIS — R7303 Prediabetes: Secondary | ICD-10-CM

## 2021-12-31 DIAGNOSIS — R3915 Urgency of urination: Secondary | ICD-10-CM

## 2021-12-31 DIAGNOSIS — I340 Nonrheumatic mitral (valve) insufficiency: Secondary | ICD-10-CM

## 2021-12-31 DIAGNOSIS — R053 Chronic cough: Secondary | ICD-10-CM

## 2021-12-31 DIAGNOSIS — E785 Hyperlipidemia, unspecified: Secondary | ICD-10-CM

## 2021-12-31 DIAGNOSIS — G4733 Obstructive sleep apnea (adult) (pediatric): Secondary | ICD-10-CM

## 2021-12-31 DIAGNOSIS — I10 Essential (primary) hypertension: Secondary | ICD-10-CM

## 2021-12-31 DIAGNOSIS — I219 Acute myocardial infarction, unspecified: Secondary | ICD-10-CM

## 2021-12-31 DIAGNOSIS — I4891 Unspecified atrial fibrillation: Secondary | ICD-10-CM

## 2021-12-31 DIAGNOSIS — I255 Ischemic cardiomyopathy: Secondary | ICD-10-CM

## 2021-12-31 DIAGNOSIS — J302 Other seasonal allergic rhinitis: Secondary | ICD-10-CM

## 2021-12-31 DIAGNOSIS — M549 Dorsalgia, unspecified: Secondary | ICD-10-CM

## 2021-12-31 DIAGNOSIS — M1990 Unspecified osteoarthritis, unspecified site: Secondary | ICD-10-CM

## 2021-12-31 DIAGNOSIS — K449 Diaphragmatic hernia without obstruction or gangrene: Secondary | ICD-10-CM

## 2021-12-31 DIAGNOSIS — E669 Obesity, unspecified: Secondary | ICD-10-CM

## 2021-12-31 DIAGNOSIS — G5600 Carpal tunnel syndrome, unspecified upper limb: Secondary | ICD-10-CM

## 2021-12-31 DIAGNOSIS — Z95810 Presence of automatic (implantable) cardiac defibrillator: Secondary | ICD-10-CM

## 2021-12-31 DIAGNOSIS — K219 Gastro-esophageal reflux disease without esophagitis: Secondary | ICD-10-CM

## 2021-12-31 DIAGNOSIS — I509 Heart failure, unspecified: Secondary | ICD-10-CM

## 2022-01-06 ENCOUNTER — Ambulatory Visit: Admit: 2022-01-06 | Discharge: 2022-01-06 | Payer: MEDICARE

## 2022-01-06 ENCOUNTER — Encounter: Admit: 2022-01-06 | Discharge: 2022-01-06 | Payer: MEDICARE

## 2022-01-17 ENCOUNTER — Encounter: Admit: 2022-01-17 | Discharge: 2022-01-17 | Payer: MEDICARE

## 2022-01-17 MED ORDER — EZETIMIBE 10 MG PO TAB
ORAL_TABLET | 2 refills | Status: AC
Start: 2022-01-17 — End: ?

## 2022-01-17 MED ORDER — TIZANIDINE 2 MG PO TAB
ORAL_TABLET | 2 refills | Status: AC
Start: 2022-01-17 — End: ?

## 2022-01-17 MED ORDER — POTASSIUM CHLORIDE 20 MEQ PO TBTQ
ORAL_TABLET | 2 refills | 30.00000 days | Status: AC
Start: 2022-01-17 — End: ?

## 2022-01-21 ENCOUNTER — Encounter: Admit: 2022-01-21 | Discharge: 2022-01-21 | Payer: MEDICARE

## 2022-01-21 MED ORDER — FUROSEMIDE 40 MG PO TAB
ORAL_TABLET | ORAL | 2 refills | 90.00000 days | Status: AC
Start: 2022-01-21 — End: ?

## 2022-02-01 ENCOUNTER — Encounter: Admit: 2022-02-01 | Discharge: 2022-02-01 | Payer: MEDICARE

## 2022-02-01 NOTE — Telephone Encounter
RC to patient. He was wondering if he had any labs that needed to be drawn prior to his apt with RCP. As of right now, no labs are ordered.   Patient states Dr. Hale Bogus usually draws TSH, and a few other labs. RN does not see any labs ordered by Dr. Hale Bogus at this time. Patient is going to call Dr. Francee Nodal team to f/u. Reviewed plan with the patient. Patient verbalized understanding and does not have any further questions or concerns. No further education requested from patient. Patient has our contact information for future needs.

## 2022-02-01 NOTE — Telephone Encounter
-----   Message from Golda Acre, LPN sent at 5/68/1275 10:14 AM CDT -----  Regarding: RCP- Called at 9:43am and would like a call at #[phone] or wife [phone].

## 2022-02-07 ENCOUNTER — Encounter: Admit: 2022-02-07 | Discharge: 2022-02-07 | Payer: MEDICARE

## 2022-02-08 ENCOUNTER — Encounter: Admit: 2022-02-08 | Discharge: 2022-02-08 | Payer: MEDICARE

## 2022-02-10 ENCOUNTER — Encounter: Admit: 2022-02-10 | Discharge: 2022-02-10 | Payer: MEDICARE

## 2022-02-10 ENCOUNTER — Ambulatory Visit: Admit: 2022-02-10 | Discharge: 2022-02-10 | Payer: MEDICARE

## 2022-02-10 DIAGNOSIS — M792 Neuralgia and neuritis, unspecified: Secondary | ICD-10-CM

## 2022-02-10 DIAGNOSIS — Z9689 Presence of other specified functional implants: Secondary | ICD-10-CM

## 2022-02-10 DIAGNOSIS — J449 Chronic obstructive pulmonary disease, unspecified: Secondary | ICD-10-CM

## 2022-02-10 DIAGNOSIS — M549 Dorsalgia, unspecified: Secondary | ICD-10-CM

## 2022-02-10 DIAGNOSIS — R7303 Prediabetes: Secondary | ICD-10-CM

## 2022-02-10 DIAGNOSIS — I2510 Atherosclerotic heart disease of native coronary artery without angina pectoris: Secondary | ICD-10-CM

## 2022-02-10 DIAGNOSIS — M25561 Pain in right knee: Secondary | ICD-10-CM

## 2022-02-10 DIAGNOSIS — R053 Chronic cough: Secondary | ICD-10-CM

## 2022-02-10 DIAGNOSIS — K219 Gastro-esophageal reflux disease without esophagitis: Secondary | ICD-10-CM

## 2022-02-10 DIAGNOSIS — E785 Hyperlipidemia, unspecified: Secondary | ICD-10-CM

## 2022-02-10 DIAGNOSIS — I10 Essential (primary) hypertension: Secondary | ICD-10-CM

## 2022-02-10 DIAGNOSIS — T148XXA Other injury of unspecified body region, initial encounter: Secondary | ICD-10-CM

## 2022-02-10 DIAGNOSIS — K449 Diaphragmatic hernia without obstruction or gangrene: Secondary | ICD-10-CM

## 2022-02-10 DIAGNOSIS — I255 Ischemic cardiomyopathy: Secondary | ICD-10-CM

## 2022-02-10 DIAGNOSIS — H532 Diplopia: Secondary | ICD-10-CM

## 2022-02-10 DIAGNOSIS — M5137 Other intervertebral disc degeneration, lumbosacral region: Secondary | ICD-10-CM

## 2022-02-10 DIAGNOSIS — E669 Obesity, unspecified: Secondary | ICD-10-CM

## 2022-02-10 DIAGNOSIS — G5600 Carpal tunnel syndrome, unspecified upper limb: Secondary | ICD-10-CM

## 2022-02-10 DIAGNOSIS — I4891 Unspecified atrial fibrillation: Secondary | ICD-10-CM

## 2022-02-10 DIAGNOSIS — I509 Heart failure, unspecified: Secondary | ICD-10-CM

## 2022-02-10 DIAGNOSIS — Z95810 Presence of automatic (implantable) cardiac defibrillator: Secondary | ICD-10-CM

## 2022-02-10 DIAGNOSIS — I340 Nonrheumatic mitral (valve) insufficiency: Secondary | ICD-10-CM

## 2022-02-10 DIAGNOSIS — M1990 Unspecified osteoarthritis, unspecified site: Secondary | ICD-10-CM

## 2022-02-10 DIAGNOSIS — M5417 Radiculopathy, lumbosacral region: Secondary | ICD-10-CM

## 2022-02-10 DIAGNOSIS — J302 Other seasonal allergic rhinitis: Secondary | ICD-10-CM

## 2022-02-10 DIAGNOSIS — N529 Male erectile dysfunction, unspecified: Secondary | ICD-10-CM

## 2022-02-10 DIAGNOSIS — G4733 Obstructive sleep apnea (adult) (pediatric): Secondary | ICD-10-CM

## 2022-02-10 DIAGNOSIS — I219 Acute myocardial infarction, unspecified: Secondary | ICD-10-CM

## 2022-02-10 NOTE — Progress Notes
EMG/NCS study  Vitals:    02/10/22 1530   BP: (!) 140/82   Pulse: 88     Procedure Time Out Check List:  Prior to the start of the procedure, I personally confirmed the following:      Patient Identity (name & date of birth): Yes  Procedure: Yes  Site: Yes  Body Part: Yes    The risks of the procedure, including infection, bleeding, and pain, were discussed with the patient.    Preprocedure pain score was 0 and postprocedure pain score was 3.    Bing Quarry, MD  Professor  Department of Neurology

## 2022-02-10 NOTE — Progress Notes
[facility] Interventional [facility]  Subjective     Chief Complaint:   Chief Complaint   Patient presents with   ? Lower Back - Pain       HPI: Johnny [name] is a 76 y.o. 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 who presents for evaluation.     Patient presents to clinic for follow-up of LBP/knee pain.    Has sinus infection  Saw ENT today - now on steroids and antibiotics    S/P SCS placement on 11/17/2021  Reports 50% relief with SCS  Radiating pain in LLE has improved by 90%  Lower back pain is better in the am, but worsens by evening  Right knee pain continues to have pain    Most pain is lower back and right knee  No radiating pain in LLE  Does have right knee - S/P 2 revisions  Pain is achy  Worse in the evenings and with movement    Patient endorses achiness/heaviness of legs with walking a long distance that improves with rest. Positive Shopping Cart sign.     Renne Musca Galdamez denies any recent fevers, chills, infection, antibiotics, bowel or bladder incontinence, saddle anesthesia. +Xarelto for a. fib.  Recent diagnosis of parkinsons    Taking tizanidine, reports mod. pain relief.. Has decreased due to dizziness.        PRIOR MEDICATIONS:   Effective  Acetaminophen (little)  Tizanidine    Ineffective  Gabapentin    Unable to tolerate  NSAID    Never  Lyrica  Ami/Nortriptyline  Cymbalta      PRIOR INTERVENTIONS:  L-spine surgery with hardware L2-3 L3-4 and then later L4-5 and L5-1 next (2 surgeries, most recent in 2008) Right TKA with revision  Effective  Bilateral SIJ (OSH, good benefit)  Left L5-S2 TFESI  right knee RFA  Lumbar RFA      Ineffective  ESI x3 (OSH)      Past Medical History:  Medical History:   Diagnosis Date   ? Atrial fibrillation (HCC) 02/08/2009   ? Back pain    ? CAD (coronary artery disease) 02/08/2009   ? Carpal tunnel syndrome    ? Chronic cough    ? Congestive heart disease (HCC)    ? COPD (chronic obstructive pulmonary disease) (HCC)    ? Erectile dysfunction, vasculogenic    ? Fracture    ? GERD (gastroesophageal reflux disease)     controlled with elevated HOB, protonix and pepcid    ? Hiatal hernia    ? Hyperlipemia 02/11/2009   ? Hypertension 02/11/2009   ? 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 Mitral regurgitation    ? Myocardial infarction [facility]) 2006   ? Obesity, Class I, 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)    ? OSA on CPAP 02/11/2009   ? Osteoarthritis    ? Pre-diabetes    ? S/P ICD (internal cardiac defibrillator) procedure 02/11/2009   ? Seasonal allergies  Family History:  Family History   Problem Relation Age of Onset   ? Cancer Father    ? Hypertension Mother        Social History:  Lives in [place] [id number] (1.25 hours away)    Social History     Socioeconomic History   ? Marital status: Married   Tobacco Use   ? Smoking status: Never   ? Smokeless tobacco: Never   Vaping Use   ? Vaping Use: Never used   Substance and Sexual Activity   ? Alcohol use: No     Alcohol/week: 0.0 standard drinks of alcohol     Comment: very rarely 4 drinks per year   ? Drug use: No       Allergies:  Allergies   Allergen Reactions   ? Clarithromycin RASH     Allergy recorded in SMS: Biaxin~Reactions: RASH~THRUSH   ? Ketoconazole SEE COMMENTS     Had bloodshot eyes after use, and sore.   ? Lisinopril COUGH   ? Oxycodone UNKNOWN     Nightmare       Medications:    Current Outpatient Medications:   ?  acetaminophen (TYLENOL) 325 mg tablet, Take two tablets by mouth every 4 hours as needed for Pain., Disp: 300 tablet, Rfl: 1  ?  albuterol (VENTOLIN HFA, PROAIR HFA) 90 mcg/actuation inhaler, Inhale two puffs by mouth into the lungs four times daily as needed for Wheezing., Disp: , Rfl:   ?  apixaban (ELIQUIS) 5 mg tablet, Take one tablet by mouth twice daily., Disp: 180 tablet, Rfl: 3  ?  aspirin 81 mg chewable tablet, Take 1 Tab by mouth daily. WAIT to restart until finished taking lovenox injections. (Patient taking differently: Chew one tablet by mouth at bedtime daily.), Disp: 90 Tab, Rfl: 3  ?  carbidopa/levodopa (SINEMET) 25/100 mg tablet, Take one tablet by mouth three times daily., Disp: 270 tablet, Rfl: 0  ?  cephalexin (KEFLEX) 500 mg capsule, Take one capsule by mouth four times daily., Disp: 28 capsule, Rfl: 0  ?  cetirizine (ZYRTEC) 10 mg tablet, Take one tablet by mouth daily., Disp: , Rfl:   ?  cholecalciferol (Vitamin D3) (VITAMIN D-3) 1,000 units tablet, Take one tablet by mouth twice daily.  , Disp: , Rfl:   ?  dextromethorphan/guaiFENesin (MUCINEX DM) 30/600 mg Tb12, Take two tablets by mouth daily as needed., Disp: , Rfl:   ?  docusate (COLACE) 100 mg capsule, Take two capsules by mouth at bedtime daily. Pt is taking 550 mg daily, Disp: , Rfl:   ?  ezetimibe (ZETIA) 10 mg tablet, TAKE 1 TABLET BY MOUTH AT BEDTIME, Disp: 90 tablet, Rfl: 2  ?  famotidine (PEPCID) 40 mg tablet, Take one tablet by mouth at bedtime daily., Disp: , Rfl:   ?  fexofenadine(+) (ALLEGRA) 180 mg tablet, Take one tablet by mouth daily., Disp: , Rfl:   ?  fluticasone-umeclidin-vilanter (TRELEGY ELLIPTA) 100-62.5-25 mcg inhaler, Inhale 1 Dose by mouth into the lungs at bedtime daily., Disp: , Rfl:   ? furosemide (LASIX) 40 mg tablet, TAKE 2 TABLETS BY MOUTH DAILY TAKE 3 TABLETS ON MONDAY/WEDNESDAY/ FRIDAY AS DIRECTED BY CARDIOLOGY, Disp: 270 tablet, Rfl: 2  ?  glucosamine su 2KCl-chondroit 500-400 mg tab, Take 1 Tab by mouth twice daily., Disp: , Rfl:   ?  ipratropium bromide (ATROVENT) 42 mcg (0.06 %) nasal spray, Apply two sprays to each nostril as directed twice daily as needed., Disp: , Rfl:   ?  losartan (COZAAR) 50 mg tablet, Take one tablet by mouth daily., Disp: 90 tablet, Rfl: 3  ?  metoprolol succinate XL (TOPROL XL) 100 mg extended release tablet, TAKE 1 TABLET BY MOUTH EVERY DAY, Disp: 90 tablet, Rfl: 3  ?  mucus clearing device (AEROBIKA OSCILLATING PEP SYSTM MISC), Use  as directed. Use with salt water and inhale twice daily, Disp: , Rfl:   ?  MYRBETRIQ 50 mg tablet, Take one tablet by mouth daily. Does not take on days he takes Ditropan, Disp: , Rfl:   ?  OMEGA-3 FATTY ACIDS-FISH OIL PO, Take 1 capsule by mouth twice daily., Disp: , Rfl:   ?  other medication, mupirocin ointment and budesonide solution mixed with warm water Use as nasal irrigation twice daily, Disp: , Rfl:   ?  pantoprazole DR (PROTONIX) 40 mg tablet, Take one tablet by mouth twice daily., Disp: , Rfl:   ?  polyethylene glycol 3350 (GLYCOLAX; MIRALAX) 17 gram/dose powder, Take 17 g by mouth daily. (Patient taking differently: Take seventeen g by mouth nightly as needed.), Disp: 595 g, Rfl: 3  ?  potassium chloride SR (K-DUR) 20 mEq tablet, TAKE 2 TABLETS BY MOUTH ON MONDAY, WEDNESDAY AND FRIDAY AND TAKE 1 TABLET ALL OTHER DAYS, Disp: 140 tablet, Rfl: 2  ?  pyRIDostigmine bromide (MESTINON) 60 mg tablet, Take one tablet by mouth three times daily. 1.5 tablets 3 times daily, Disp: , Rfl:   ?  rosuvastatin (CRESTOR) 40 mg tablet, TAKE 1 TABLET BY MOUTH AT BEDTIME, Disp: 90 tablet, Rfl: 3  ?  spironolactone (ALDACTONE) 25 mg tablet, TAKE 1 TABLET BY MOUTH EVERY DAY TAKE WITH FOOD, Disp: 90 tablet, Rfl: 3  ?  testosterone cypionate 200 mg/mL kit, Inject 1 mL into the muscle every 14 days., Disp: , Rfl:   ?  tiZANidine (ZANAFLEX) 2 mg tablet, TAKE 1 TO 3 TABLETS BY MOUTH AT BEDTIME AS NEEDED, Disp: 90 tablet, Rfl: 2  ?  traMADoL (ULTRAM) 50 mg tablet, Take one tablet by mouth every 8 hours as needed for Pain., Disp: 30 tablet, Rfl: 0  ?  traMADoL (ULTRAM) 50 mg tablet, every 4-6 hours, Disp: , Rfl:   ?  traZODone (DESYREL) 100 mg tablet, bedtime, Disp: , Rfl:   ?  Vacuum Erection Device System kit, Use as directed for sexual activity., Disp: 1 kit, Rfl: 11    Physical examination:   BP 136/68 (BP Source: Arm, Left Upper, Patient Position: Sitting)  - Pulse 81  - Temp 36.6 ?C (97.8 ?F) (Temporal)  - Ht 180.3 cm (5' 11)  - Wt 111.1 kg (245 lb) Comment: per pt - SpO2 97%  - BMI 34.17 kg/m?   Pain Score: Five    General: The patient is a well-developed, well nourished 76 y.o. male in no acute distress.   HEENT: Head is normocephalic and atraumatic. EOMI bilaterally.   Cardiac: Based on palpation, pulse appears to be regular rate and rhythm.   Pulmonary: The patient has unlabored respirations and bilateral symmetric chest excursion.   Abdomen: Soft, nontender, and nondistended. There is no rebound or guarding.   Extremities: No clubbing, cyanosis, or edema. Mild TTP to diffuse anterior right knee, minimal posterior knee pain.     Neurologic:   The patient is alert and oriented times 3.   Cranial nerves II through XII are intact without any focal deficits.     Musculoskeletal:   Walks with cane.     L-Spine   There is  mild low lumbar paraspinal tenderness. Paraspinal muscle tone is increased.  Facet loading is positive.  ROM with flexion, extension, rotation, and lateral bending is intact.  Strength is equal and adequate bilaterally in the flexors and extensors of the bilateral lower extremities.   SLR is negative bilaterally.    Midline and pocket incision CDI, well healed and well approximated, no evidence of swelling, erythema or warmth. Results for orders placed during the hospital encounter of 08/24/20    MRI L-SPINE WO/W CONTRAST    Addendum 08/25/2020  5:39 PM  Finalized by Ivory Broad, M.D. on 08/24/2020 5:00 PM. Dictated by Delfin Gant, M.D. on 08/24/2020 3:54 PM.Addendum:  Delfin Gant discussed these findings via telephone with Dr. Francee Nodal nurse, Vassie Moment, at 8:34 AM on 08/25/2020.      Approved by Delfin Gant, M.D. on 08/25/2020 8:35 AM    By my electronic signature, I attest that I have personally reviewed the images for this examination and formulated the interpretations and opinions expressed in this report      Finalized by Ivory Broad, M.D. on 08/25/2020 5:36 PM. Dictated by Delfin Gant, M.D. on 08/25/2020 8:33 AM.    Narrative  EXAM: MRI L-SPINE    HISTORY:    , lumbar radiculapathy,    Technique: Multiple sagittal and axial MR sequences were obtained of the lumbar spine with and without MultiHance contrast.    Comparison: CT lumbar spine August 27, 2014    FINDINGS:    There are 5 nonrib-bearing lumbar type vertebral bodies. There is left convexity lumbar spinal curvature. Trace retrolisthesis at L2-L3 and trace anterolisthesis of L5-S1. X-Stop devices are seen at the spinous processes of the lumbar spine. Vertebral body heights are maintained. Multilevel vertebral endplate marrow changes. No suspicious bone marrow replacing lesion. The conus is normal in appearance and position at the T12-L1 level. abnormal enhancing lesion is identified. Bilateral renal atrophy. Small upper pole right renal cyst. There is an additional 1.3 cm upper pole right renal lesion which demonstrates T2 and T1 hypointensity (series 5, image 8).    T12-L1: No significant central spinal or neuroforaminal stenosis.    L1-L2: Marked disc degeneration, mild circumferential disc bulge, and facet hypertrophy. Mild central spinal stenosis. Moderate left and marked right neural foraminal stenosis.    L2-L3: Trace retrolisthesis, moderate disc degeneration, circumferential disc bulge, facet hypertrophy, and mild malignant flavum thickening. Mild central spinal stenosis. Moderate left and marked right neuroforaminal stenosis.    L3-L4: Moderate disc degeneration, circumferential disc bulge, and facet hypertrophy. Moderate to marked spinal stenosis. Marked bilateral neuroforaminal stenosis.    L4-L5: Mild disc degeneration, circumferential disc bulge with superimposed central disc protrusion, ligamentum flavum thickening, and facet hypertrophy. Moderate to marked central spinal stenosis. Marked bilateral neural foraminal stenosis.    L5-S1: Mild disc degeneration, circumferential disc bulge, and facet hypertrophy. Moderate central spinal stenosis. Marked bilateral neural foraminal stenosis.    Impression  1.  Multilevel degenerative central spinal stenosis, greatest of moderate to marked degree at L3-L4, L4-L5, and L5-S1.  2.  Multilevel degenerative neuroforaminal stenosis, greatest of marked degree on the right at L1-L2 and L2-L3 as well as bilaterally at L3-L4, L4-5, and L5-S1.  3.  Left convexity lumbar spinal curvature.  4.  Trace retrolisthesis of L2-L3 and trace anterolisthesis of L5-S1.  5.  T2 and T1 hypointense peripheral right renal lesion. MRI abdomen (renal mass protocol) is recommended for further evaluation.    By my electronic signature, I attest that I have personally  reviewed the images for this examination and formulated the interpretations and opinions expressed in this report          Last Cr and LFT's:  Creatinine   Date Value Ref Range Status   03/31/2021 1.30 (H) 0.4 - 1.24 MG/DL Final     AST (SGOT)   Date Value Ref Range Status   11/22/2018 25 7 - 40 U/L Final     ALT (SGPT)   Date Value Ref Range Status   11/22/2018 24 7 - 56 U/L Final     Alk Phosphatase   Date Value Ref Range Status   11/22/2018 51 25 - 110 U/L Final     Total Bilirubin   Date Value Ref Range Status   11/22/2018 0.8 0.3 - 1.2 MG/DL Final Assessment:    Antonia Culbertson is a 76 y.o. 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 who presents for evaluation of pain.    The pain complaints are most likely due to:    1. 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 Chronic pain of right knee        3. DDD (degenerative disc disease), lumbosacral        4. Lumbosacral radiculopathy        5. Neuropathic pain            Patient has had an adequate trial of > 12 months of rest, exercise, multimodal treatment, and the passage of time without improvement of symptoms. The pain has significant impact on the daily quality of life.     Plan:    1.Discussed care options with patient. Plan on X-ray to check SCS lead placement.   2.Continue tizanidine as prescribed  3. Encouraged to continue at home PT program.  4. Follow up in 6 weeks.       Risks/benefits of all pharmacologic and interventional treatments discussed and questions answered.

## 2022-02-17 ENCOUNTER — Encounter: Admit: 2022-02-17 | Discharge: 2022-02-17 | Payer: MEDICARE

## 2022-02-24 ENCOUNTER — Encounter: Admit: 2022-02-24 | Discharge: 2022-02-24 | Payer: MEDICARE

## 2022-02-24 NOTE — Progress Notes
POC signed by Dr. Nedra Hai and faxed to [facility] at [phone]. Confirmation received.

## 2022-03-01 ENCOUNTER — Encounter: Admit: 2022-03-01 | Discharge: 2022-03-01 | Payer: MEDICARE

## 2022-03-01 DIAGNOSIS — I472 VT (ventricular tachycardia) (HCC): Secondary | ICD-10-CM

## 2022-03-01 DIAGNOSIS — I255 Ischemic cardiomyopathy: Secondary | ICD-10-CM

## 2022-03-01 DIAGNOSIS — I480 Paroxysmal atrial fibrillation: Secondary | ICD-10-CM

## 2022-03-16 ENCOUNTER — Encounter: Admit: 2022-03-16 | Discharge: 2022-03-16 | Payer: MEDICARE

## 2022-03-16 ENCOUNTER — Ambulatory Visit: Admit: 2022-03-16 | Discharge: 2022-03-16 | Payer: MEDICARE

## 2022-03-16 DIAGNOSIS — R0989 Other specified symptoms and signs involving the circulatory and respiratory systems: Secondary | ICD-10-CM

## 2022-03-16 DIAGNOSIS — Z95810 Presence of automatic (implantable) cardiac defibrillator: Secondary | ICD-10-CM

## 2022-03-16 DIAGNOSIS — J302 Other seasonal allergic rhinitis: Secondary | ICD-10-CM

## 2022-03-16 DIAGNOSIS — E785 Hyperlipidemia, unspecified: Secondary | ICD-10-CM

## 2022-03-16 DIAGNOSIS — I480 Paroxysmal atrial fibrillation: Secondary | ICD-10-CM

## 2022-03-16 DIAGNOSIS — K449 Diaphragmatic hernia without obstruction or gangrene: Secondary | ICD-10-CM

## 2022-03-16 DIAGNOSIS — M5440 Lumbago with sciatica, unspecified side: Secondary | ICD-10-CM

## 2022-03-16 DIAGNOSIS — R7303 Prediabetes: Secondary | ICD-10-CM

## 2022-03-16 DIAGNOSIS — G7000 Myasthenia gravis without (acute) exacerbation: Secondary | ICD-10-CM

## 2022-03-16 DIAGNOSIS — R0609 Other forms of dyspnea: Secondary | ICD-10-CM

## 2022-03-16 DIAGNOSIS — R053 Chronic cough: Secondary | ICD-10-CM

## 2022-03-16 DIAGNOSIS — N529 Male erectile dysfunction, unspecified: Secondary | ICD-10-CM

## 2022-03-16 DIAGNOSIS — M1990 Unspecified osteoarthritis, unspecified site: Secondary | ICD-10-CM

## 2022-03-16 DIAGNOSIS — E7800 Pure hypercholesterolemia, unspecified: Secondary | ICD-10-CM

## 2022-03-16 DIAGNOSIS — I219 Acute myocardial infarction, unspecified: Secondary | ICD-10-CM

## 2022-03-16 DIAGNOSIS — E669 Obesity, unspecified: Secondary | ICD-10-CM

## 2022-03-16 DIAGNOSIS — I10 Essential (primary) hypertension: Secondary | ICD-10-CM

## 2022-03-16 DIAGNOSIS — G5600 Carpal tunnel syndrome, unspecified upper limb: Secondary | ICD-10-CM

## 2022-03-16 DIAGNOSIS — I509 Heart failure, unspecified: Secondary | ICD-10-CM

## 2022-03-16 DIAGNOSIS — J449 Chronic obstructive pulmonary disease, unspecified: Secondary | ICD-10-CM

## 2022-03-16 DIAGNOSIS — I4891 Unspecified atrial fibrillation: Secondary | ICD-10-CM

## 2022-03-16 DIAGNOSIS — J438 Other emphysema: Secondary | ICD-10-CM

## 2022-03-16 DIAGNOSIS — I255 Ischemic cardiomyopathy: Secondary | ICD-10-CM

## 2022-03-16 DIAGNOSIS — T148XXA Other injury of unspecified body region, initial encounter: Secondary | ICD-10-CM

## 2022-03-16 DIAGNOSIS — M549 Dorsalgia, unspecified: Secondary | ICD-10-CM

## 2022-03-16 DIAGNOSIS — I340 Nonrheumatic mitral (valve) insufficiency: Secondary | ICD-10-CM

## 2022-03-16 DIAGNOSIS — I2510 Atherosclerotic heart disease of native coronary artery without angina pectoris: Secondary | ICD-10-CM

## 2022-03-16 DIAGNOSIS — G4733 Obstructive sleep apnea (adult) (pediatric): Secondary | ICD-10-CM

## 2022-03-16 DIAGNOSIS — I472 VT (ventricular tachycardia) (HCC): Secondary | ICD-10-CM

## 2022-03-16 DIAGNOSIS — K219 Gastro-esophageal reflux disease without esophagitis: Secondary | ICD-10-CM

## 2022-03-16 NOTE — Assessment & Plan Note
He continues to have asymptomatic episodes of atrial fibrillation with an overall burden of 0.5%.  His last episode occurred on June 26 lasting 5 hours and 44 minutes.  Of note, [name] remains on chronic anticoagulation.    I do not think we need to make any changes today to his regimen.  I stressed the importance of staying on anticoagulation.  He is not currently on an antiarrhythmic agent.  If he has more A-fib, we could consider another ablation or alternatively, reinstitution of Tikosyn.

## 2022-03-16 NOTE — Assessment & Plan Note
Device interrogation today shows stable sensing and pacing thresholds.  He has had no ventricular arrhythmias.  Overall A-fib burden is less than 1%.  His last episode of A-fib on June 26 lasted 5 hours and 44 minutes.  It was not associated with symptoms.  I did not make any changes to his programming.

## 2022-03-16 NOTE — Assessment & Plan Note
His back pain has improved with stimulator placement.  I encouraged him to keep up his efforts at exercising.

## 2022-03-16 NOTE — Assessment & Plan Note
Unfortunately, cannot is complaining of decreased endurance and a lot of diaphoresis.  He is short of breath with exertion.  Patient's thyroid function is within normal limits.  He is wondering whether this could be cardiac related.  He is having such a low A-fib burden, that I doubt it is due to an arrhythmia.  I have asked him to undergo another echocardiogram.  He apparently had one done at [facility] in September 2022.  We will get those records so we can compare the echo results.    Mr. Johnny [name] is also requested a follow-up with Dr. Hale Bogus which I think is reasonable.  We will get that scheduled.

## 2022-03-16 NOTE — Progress Notes
Date of Service: 03/16/2022    Johnny [name] is a 76 y.o. male.       HPI     Johnny [name] presents to my office today in electrophysiology follow-up for history of atrial arrhythmias.  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  At that time, he was complaining of significant lower back pain.  Device interrogation also showed recurrent atrial fibrillation with an overall burden of 1%.  We did discuss further treatment for his A-fib.  After long discussion, we decided to simply monitor his burden.  Johnny [name] is on chronic anticoagulation but had come off of tikosyn after successful ablation.    Johnny [name] returns my office today, telling me that he has been feeling better.  He has received a stimulator for his back which is helped with pain.  Patient denies any chest discomfort, dizziness, or syncope.  Unfortunately, he has noticed that his endurance level has not been as good recently.  He also tells me he sweats a lot and is short of breath with exertion.  Patient does utilize CPAP regularly.  He is also been diagnosed with myasthenia gravis and wants to know whether this is different from Parkinson's.  Patient is followed routinely through [facility] in [place] for his pulmonary issues.  He had a echocardiogram by his recollection in September 2022.    Interrogation of the patient's dual-chamber defibrillator shows stable sensing and pacing thresholds.  His overall A-fib burden is 0.5%.  Last episode occurred on June 26 and lasted 5 hours and 44 minutes.  It was not associated with symptoms.           Vitals:    03/16/22 1045   BP: 122/64   BP Source: Arm, Left Upper   Pulse: 83   SpO2: 98%   O2 Device: CPAP/BiPAP   Weight: 109.8 kg (242 lb)   Height: 180.3 cm (5' 11)     Body mass index is 33.75 kg/m?Marland Kitchen     Past Medical History  Patient Active Problem List    Diagnosis Date Noted   ? DOE (dyspnea on exertion) 03/16/2022   ? 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 Myasthenia gravis (HCC) 03/16/2022   ? Overactive bladder (OAB)      Urinary frequency, urgency.     ? Back pain    ? 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 OSA (obstructive sleep apnea) 12/30/2020     CPAP titration (03/13/20);  6 CM H20 recommended  PLMI 4.4, PLMAI 0.6  Increased muscle tone noted during some epochs of REM    Split night sleep study (04/16/21):  AHI 11.6 (CAI 0), SpO2 min 83%, 6.3 min less than or equal to 88%  CPAP 9 cm H20  PLMI Diagnostic portion 92.3 , PLMI Treatment Portion 18/hr without arousals       ? 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 Erectile dysfunction, vasculogenic    ? Beta blocker prescribed for left ventricular systolic dysfunction 07/14/2020     Patient on beta-blocker ARB and Aldo blocker because of EF down to 35% with referral for ICD.  EF has  improved but patient needs to continue HFrEF oriented medical therapy     ? Visit for monitoring Tikosyn therapy 11/22/2018   ? Atrial fibrillation (HCC) 11/22/2018   ? VT (ventricular tachycardia) (HCC) 02/28/2018     02/19/2020 - Regadenoson MPI:  Small basal to mid inferolateral wall  fixed perfusion defec with corresponding hypokinesis on gated images suggestive of myocardial injury & small size, mild basal anterolateral wall  predominantly reversible perfusion defect  Corresponding regional wall motion is normal.  All myocardial segments appear viable.EF 46%.  High risk scintigraphic features are absent.      ? 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 Encounter for anticoagulation discussion and counseling 11/28/2014   ? Constipation due to opioid therapy 09/05/2014 ? Closed fracture of multiple ribs of left side 08/29/2014   ? 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 Left arm swelling 04/01/2014   ? Hyperlipemia 02/11/2009     HIghest total cholesterol he recalls pre treatment: 270  a. 7/06 Rx Vytorin 10/40, then to 10/80 approx 04/28/05. Recalls knee pain w/ lovastatin  b. 08/03/05 total 156 trig 118 HDL 39 LDL 95  C. 02/09/09 total 148 trig 114  HDL 43  LDL 81 Vytorin 10/80 Fishoil  >>Starts Lipitor 80 + Zetia March 2011     ? Sleep apnea 02/11/2009     Sleep apnea      a. 2/07 Using CPAP, good tolerance and benefit after Sleep study approx 2003 Dr. Andreas Newport     ? Hypertension 02/11/2009     Hypertension        a. ACE cough on lisinopril, olmesartan 20 well tolerated      ? Cerebrovascular disease 02/11/2009     Cerebrovascular disease       a. 7/06 Carotid US mod disease < 50% sten     ? 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  04/14/05 #28 [place] ring, Resect P2 Seg Post leaflet,ring.as above w/ CABG-[place]     ? 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 04/14/05: CABG x 5 LIMA-LAD, L Rad-Dx, sSVG-RCA-RCA, sSVG-OM & MV Plasty for MR  with initial trigger for evaluation being murmur and dyspnea  b. 12/13/05 EF 35% by aden thall, Mixed defect in Cx distribution. Referred for ICD   c. Rash w/ Cleda Daub, switch to Inspra  d. 6/07 Removal of all 8 sternal wires and parasternal lipoma d/t persisting pain  E. Jan, 2011 Regaden Simonne Maffucci. Limited inferior Defect, unchanged from 2007. EF 48%  F. 10/08/13: Reg/Thall Stress: EF 48% No ischemia or change from prior studies.    January 15, 2018  Lexiscan sestamibi study;  sinus rhythm, no arrhythmias or EKG changes    EF was 57% with normal wall motion and normal perfusion. [employer]      ? Atrial Fib Paroxysmal  (HCC) 02/08/2009     a. 7/06 Early post CAB Afib, Brief amio pre dismissal. Post DC AF still rapid, admit [place]  b. 05/23/05 TEE: No LA clot, EF 25% Cvert to SR on Ticosyn 500 BID, dec'd to 250 BID w/             QTC@ , QTc to 470. Home 05/26/05  c. 11/29/05 Ticosyn DC'd, Dr. Hale Bogus p recurrent afib on ticosyn  D. 12/14 NSR on clinic exam, MAC OV (CBP)  E. 05/16/16 ICD interrogation: 6.2% atrial fibrillation burden, on warfarin   F 11/22/2018   Paroxysmal atrial fibrillation RF ablation  Dr Bradly Bienenstock         ? 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 Gretchen Short  12/17/13 Generator at [facility],  Fidelis RV-ICD lead extraction w/ laser assistance,Implant Medtronic Evera XT ICD and new RV lead. RA lead left i tact. DFTs done. Dr. Naoma Diener           Review of Systems   Constitutional: Positive for malaise/fatigue.   HENT: Negative.    Eyes: Negative.    Cardiovascular: Positive for dyspnea on exertion.   Respiratory: Positive for shortness of breath.    Endocrine: Negative.    Hematologic/Lymphatic: Negative.    Skin: Negative.    Musculoskeletal: Negative.    Gastrointestinal: Negative.    Genitourinary: Negative.    Neurological: Negative.    Psychiatric/Behavioral: Negative.    Allergic/Immunologic: Negative.        Physical Exam  ZOX:WRUEA, well nourished, in no acute distress  HEENT: unremarkable, no JVD  CHEST: decreased breath sounds throughout  CV: Reg rhythm, nml rate; nml S1 & S2, no S3 or S4; no rub; no murmurs  ABD: soft, nontender, non-distended, positive bowel sounds  EXT: no clubbing, cyanosis, trace edema, 1+ distal pulses  NEURO: alert and oriented x3, no focal deficits        Cardiovascular Studies  12 lead EKG:  Sinus rhythm, ventricular rate 83 bpm, QTc 519 msec, QRSd 164 msec, RBBB      Cardiovascular Health Factors  Vitals BP Readings from Last 3 Encounters:   03/16/22 122/64   02/10/22 (!) 140/82   02/10/22 136/68     Wt Readings from Last 3 Encounters:   03/16/22 109.8 kg (242 lb)   02/10/22 111.1 kg (245 lb)   12/17/21 111.1 kg (245 lb)     BMI Readings from Last 3 Encounters:   03/16/22 33.75 kg/m?   02/10/22 34.17 kg/m?   12/17/21 34.17 kg/m?      Smoking Social History     Tobacco Use   Smoking Status Never   Smokeless Tobacco Never   Vaping Use   ? Vaping Use: Never used      Lipid Profile Cholesterol   Date Value Ref Range Status   06/09/2020 138  Final     HDL   Date Value Ref Range Status   06/09/2020 55  Final     LDL   Date Value Ref Range Status   06/09/2020 60  Final     Triglycerides   Date Value Ref Range Status   06/09/2020 117  Final      Blood Sugar Hemoglobin A1C   Date Value Ref Range Status   09/23/2010 6.2       Glucose   Date Value Ref Range Status   03/31/2021 102 (H) 70 - 100 MG/DL Final   54/05/8118 147 (H) 70 - 100 MG/DL Final   82/95/6213 086 (H) 70 - 105 Final   03/15/2006 123 (H) 70 - 110 MG/DL Final   57/84/6962 99 70 - 110 MG/DL Final   95/28/4132 440 70 - 110 MG/DL Final     Glucose, POC   Date Value Ref Range Status   08/28/2014 124 (H) 70 - 100 MG/DL Final   07/16/2535 644 (H) 70 - 110 MG/DL Final   03/47/4259 563 (H) 70 - 110 MG/DL Final          Problems Addressed Today  Encounter Diagnoses  Name Primary?   ? Atrial Fib Paroxysmal  (HCC) Yes   ? Cardiovascular symptoms    ? Low back pain with sciatica, sciatica laterality unspecified, unspecified back pain laterality, unspecified chronicity    ? DOE (dyspnea on exertion)    ? Automatic implantable cardioverter-defibrillator in situ    ? Other emphysema (HCC)    ? Myasthenia gravis (HCC)        Assessment and Plan       Atrial Fib Paroxysmal  (HCC)  He continues to have asymptomatic episodes of atrial fibrillation with an overall burden of 0.5%.  His last episode occurred on June 26 lasting 5 hours and 44 minutes.  Of note, Johnny [name] remains on chronic anticoagulation.    I do not think we need to make any changes today to his regimen.  I stressed the importance of staying on anticoagulation.  He is not currently on an antiarrhythmic agent.  If he has more A-fib, we could consider another ablation or alternatively, reinstitution of Tikosyn.    Back pain  His back pain has improved with stimulator placement.  I encouraged him to keep up his efforts at exercising.    DOE (dyspnea on exertion)  Unfortunately, cannot is complaining of decreased endurance and a lot of diaphoresis.  He is short of breath with exertion.  Patient's thyroid function is within normal limits.  He is wondering whether this could be cardiac related.  He is having such a low A-fib burden, that I doubt it is due to an arrhythmia.  I have asked him to undergo another echocardiogram.  He apparently had one done at [facility] in September 2022.  We will get those records so we can compare the echo results.    Johnny [name] is also requested a follow-up with Dr. Hale Bogus which I think is reasonable.  We will get that scheduled.    Automatic implantable cardioverter-defibrillator in situ  Device interrogation today shows stable sensing and pacing thresholds.  He has had no ventricular arrhythmias.  Overall A-fib burden is less than 1%.  His last episode of A-fib on June 26 lasted 5 hours and 44 minutes.  It was not associated with symptoms.  I did not make any changes to his programming.    Other emphysema (HCC)  He has a history of COPD and tells me that he has now been started on chronic steroid therapy.  He is on 10 mg a day with the thought of increasing his dose to 20 mg.  I did warn him that this might make his heart more irritable and he is more likely to go into atrial fibrillation.  We will need to monitor him closely.    Myasthenia gravis [facility])  Patient has recently been diagnosed with myasthenia gravis.  He and his wife have several questions for me today as to whether all of his symptoms could be secondary to this disorder.  They are asking if he has Parkinson's disorder also.  I have directed him back to his neurologist for further evaluation and treatment.    I have asked the patient to see me back in follow up in one year.  We will contact him sooner with the results of his echocardiogram.      Current Medications (including today's revisions)  ? acetaminophen (TYLENOL) 325 mg tablet Take two tablets by mouth every 4 hours as needed for Pain.   ? albuterol (VENTOLIN HFA, PROAIR HFA) 90 mcg/actuation inhaler Inhale two puffs by mouth  into the lungs four times daily as needed for Wheezing.   ? apixaban (ELIQUIS) 5 mg tablet Take one tablet by mouth twice daily.   ? aspirin 81 mg chewable tablet Take 1 Tab by mouth daily. WAIT to restart until finished taking lovenox injections. (Patient taking differently: Chew one tablet by mouth at bedtime daily.)   ? carbidopa/levodopa (SINEMET) 25/100 mg tablet Take one tablet by mouth three times daily.   ? cephalexin (KEFLEX) 500 mg capsule Take one capsule by mouth four times daily.   ? cetirizine (ZYRTEC) 10 mg tablet Take one tablet by mouth daily.   ? cholecalciferol (Vitamin D3) (VITAMIN D-3) 1,000 units tablet Take one tablet by mouth twice daily.     ? dextromethorphan/guaiFENesin (MUCINEX DM) 30/600 mg Tb12 Take two tablets by mouth daily as needed.   ? docusate (COLACE) 100 mg capsule Take two capsules by mouth at bedtime daily. Pt is taking 550 mg daily   ? ezetimibe (ZETIA) 10 mg tablet TAKE 1 TABLET BY MOUTH AT BEDTIME   ? famotidine (PEPCID) 40 mg tablet Take one tablet by mouth at bedtime daily.   ? fexofenadine(+) (ALLEGRA) 180 mg tablet Take one tablet by mouth daily.   ? fluticasone-umeclidin-vilanter (TRELEGY ELLIPTA) 100-62.5-25 mcg inhaler Inhale 1 Dose by mouth into the lungs at bedtime daily.   ? furosemide (LASIX) 40 mg tablet TAKE 2 TABLETS BY MOUTH DAILY TAKE 3 TABLETS ON MONDAY/WEDNESDAY/ FRIDAY AS DIRECTED BY CARDIOLOGY   ? glucosamine su 2KCl-chondroit 500-400 mg tab Take 1 Tab by mouth twice daily.   ? ipratropium bromide (ATROVENT) 42 mcg (0.06 %) nasal spray Apply two sprays to each nostril as directed twice daily as needed.   ? losartan (COZAAR) 50 mg tablet Take one tablet by mouth daily.   ? metoprolol succinate XL (TOPROL XL) 100 mg extended release tablet TAKE 1 TABLET BY MOUTH EVERY DAY   ? mucus clearing device (AEROBIKA OSCILLATING PEP SYSTM MISC) Use  as directed. Use with salt water and inhale twice daily   ? MYRBETRIQ 50 mg tablet Take one tablet by mouth daily. Does not take on days he takes Ditropan   ? OMEGA-3 FATTY ACIDS-FISH OIL PO Take 1 capsule by mouth twice daily.   ? other medication mupirocin ointment and budesonide solution mixed with warm water  Use as nasal irrigation twice daily   ? pantoprazole DR (PROTONIX) 40 mg tablet Take one tablet by mouth twice daily.   ? polyethylene glycol 3350 (GLYCOLAX; MIRALAX) 17 gram/dose powder Take 17 g by mouth daily. (Patient taking differently: Take seventeen g by mouth nightly as needed.)   ? potassium chloride SR (K-DUR) 20 mEq tablet TAKE 2 TABLETS BY MOUTH ON MONDAY, WEDNESDAY AND FRIDAY AND TAKE 1 TABLET ALL OTHER DAYS   ? pyRIDostigmine bromide (MESTINON) 60 mg tablet Take one tablet by mouth three times daily. 1.5 tablets 3 times daily   ? rosuvastatin (CRESTOR) 40 mg tablet TAKE 1 TABLET BY MOUTH AT BEDTIME   ? spironolactone (ALDACTONE) 25 mg tablet TAKE 1 TABLET BY MOUTH EVERY DAY TAKE WITH FOOD   ? testosterone cypionate 200 mg/mL kit Inject 1 mL into the muscle every 14 days.   ? tiZANidine (ZANAFLEX) 2 mg tablet TAKE 1 TO 3 TABLETS BY MOUTH AT BEDTIME AS NEEDED   ? traMADoL (ULTRAM) 50 mg tablet Take one tablet by mouth every 8 hours as needed for Pain.   ? traMADoL (ULTRAM) 50 mg tablet every 4-6 hours   ?  traZODone (DESYREL) 100 mg tablet bedtime   ? Vacuum Erection Device System kit Use as directed for sexual activity.

## 2022-03-16 NOTE — Assessment & Plan Note
Patient has recently been diagnosed with myasthenia gravis.  He and his wife have several questions for me today as to whether all of his symptoms could be secondary to this disorder.  They areasking if he has Parkinson's disorder 2.  I have directed him back to his neurologist for further evaluation and treatment.

## 2022-03-16 NOTE — Progress Notes
STAT     Request for the following medical records, for the purpose Continuity of Care.     Please send the following:      Most Recent ECHO Report     Please Fax to:   FAX#: [phone]  Attn: Sherry

## 2022-03-16 NOTE — Assessment & Plan Note
He has a history of COPD and tells me that he has now been started on chronic steroid therapy.  He is on 10 mg a day with the thought of increasing his dose to 20 mg.  I did warn him that this might make his heart more irritable and he is more likely to go into atrial fibrillation.  We will need to monitor him closely.

## 2022-03-23 ENCOUNTER — Encounter: Admit: 2022-03-23 | Discharge: 2022-03-23 | Payer: MEDICARE

## 2022-03-23 ENCOUNTER — Ambulatory Visit: Admit: 2022-03-23 | Discharge: 2022-03-23 | Payer: MEDICARE

## 2022-03-23 DIAGNOSIS — I480 Paroxysmal atrial fibrillation: Secondary | ICD-10-CM

## 2022-03-23 MED ORDER — PERFLUTREN LIPID MICROSPHERES 1.1 MG/ML IV SUSP
1-10 mL | Freq: Once | INTRAVENOUS | 0 refills | Status: CP | PRN
Start: 2022-03-23 — End: ?
  Administered 2022-03-23: 17:00:00 2.5 mL via INTRAVENOUS

## 2022-03-31 ENCOUNTER — Encounter: Admit: 2022-03-31 | Discharge: 2022-03-31 | Payer: MEDICARE

## 2022-03-31 NOTE — Telephone Encounter
PT progress note signed and faxed to [facility] at [phone]. Confirmation received.

## 2022-04-11 ENCOUNTER — Encounter: Admit: 2022-04-11 | Discharge: 2022-04-11 | Payer: MEDICARE

## 2022-04-11 MED ORDER — CARBIDOPA-LEVODOPA 25-100 MG PO TAB
ORAL_TABLET | 0 refills | Status: AC
Start: 2022-04-11 — End: ?

## 2022-04-11 NOTE — Telephone Encounter
Received refill request for carbidopa-levodopa.  Medication is listed to continue under plan of care from last visit on 10/13/2021.  Patient has been seen in the last year and has a future appointment scheduled.  Refills authorized with neurologist to co-sign.

## 2022-04-13 ENCOUNTER — Encounter: Admit: 2022-04-13 | Discharge: 2022-04-13 | Payer: MEDICARE

## 2022-04-13 ENCOUNTER — Ambulatory Visit: Admit: 2022-04-13 | Discharge: 2022-04-13 | Payer: MEDICARE

## 2022-04-13 DIAGNOSIS — K449 Diaphragmatic hernia without obstruction or gangrene: Secondary | ICD-10-CM

## 2022-04-13 DIAGNOSIS — J302 Other seasonal allergic rhinitis: Secondary | ICD-10-CM

## 2022-04-13 DIAGNOSIS — T148XXA Other injury of unspecified body region, initial encounter: Secondary | ICD-10-CM

## 2022-04-13 DIAGNOSIS — R7303 Prediabetes: Secondary | ICD-10-CM

## 2022-04-13 DIAGNOSIS — E669 Obesity, unspecified: Secondary | ICD-10-CM

## 2022-04-13 DIAGNOSIS — M549 Dorsalgia, unspecified: Secondary | ICD-10-CM

## 2022-04-13 DIAGNOSIS — I10 Essential (primary) hypertension: Secondary | ICD-10-CM

## 2022-04-13 DIAGNOSIS — K219 Gastro-esophageal reflux disease without esophagitis: Secondary | ICD-10-CM

## 2022-04-13 DIAGNOSIS — G4752 REM sleep behavior disorder: Secondary | ICD-10-CM

## 2022-04-13 DIAGNOSIS — N529 Male erectile dysfunction, unspecified: Secondary | ICD-10-CM

## 2022-04-13 DIAGNOSIS — M1990 Unspecified osteoarthritis, unspecified site: Secondary | ICD-10-CM

## 2022-04-13 DIAGNOSIS — I340 Nonrheumatic mitral (valve) insufficiency: Secondary | ICD-10-CM

## 2022-04-13 DIAGNOSIS — Z95810 Presence of automatic (implantable) cardiac defibrillator: Secondary | ICD-10-CM

## 2022-04-13 DIAGNOSIS — I4891 Unspecified atrial fibrillation: Secondary | ICD-10-CM

## 2022-04-13 DIAGNOSIS — I509 Heart failure, unspecified: Secondary | ICD-10-CM

## 2022-04-13 DIAGNOSIS — I2510 Atherosclerotic heart disease of native coronary artery without angina pectoris: Secondary | ICD-10-CM

## 2022-04-13 DIAGNOSIS — G4733 Obstructive sleep apnea (adult) (pediatric): Secondary | ICD-10-CM

## 2022-04-13 DIAGNOSIS — I255 Ischemic cardiomyopathy: Secondary | ICD-10-CM

## 2022-04-13 DIAGNOSIS — R053 Chronic cough: Secondary | ICD-10-CM

## 2022-04-13 DIAGNOSIS — I219 Acute myocardial infarction, unspecified: Secondary | ICD-10-CM

## 2022-04-13 DIAGNOSIS — J449 Chronic obstructive pulmonary disease, unspecified: Secondary | ICD-10-CM

## 2022-04-13 DIAGNOSIS — M48061 Spinal stenosis, lumbar region without neurogenic claudication: Secondary | ICD-10-CM

## 2022-04-13 DIAGNOSIS — G20 Parkinson's disease: Secondary | ICD-10-CM

## 2022-04-13 DIAGNOSIS — E785 Hyperlipidemia, unspecified: Secondary | ICD-10-CM

## 2022-04-13 DIAGNOSIS — G5600 Carpal tunnel syndrome, unspecified upper limb: Secondary | ICD-10-CM

## 2022-04-13 NOTE — Patient Instructions
There is no one diet for Parkinson's disease (PD). But what you eat may affect how well your medication works and ease Parkinson's non-movement symptoms, such as constipation or low blood pressure. For people with PD, a well balanced diet is recommended, with plenty of fruits and vegetables, which contain antioxidants. Antioxidants clear out free radicals, substances that are harmful to cells.    A nutritious diet includes whole grains (whole wheat, quinoa, oats, brown rice), healthy fats (avocado, nuts, olive oil) and, for non-vegetarians, more fish and poultry instead of red meat. A balanced diet full of unprocessed, non-sugary foods, non-fried foods is recommended.    Shopping list for antioxidant-containing foods  - Coffee and green tea  - Dark chocolate: 80 percent or higher cacoa  - Fruits: apples, bananas, berries, cherries, grapes, melons, oranges, peaches, pears, pomegranates  - Legumes: black beans, cannellini beans, chickpeas, kidney beans, lentils, peas, pinto beans  - Nuts and seeds: almonds, hazelnuts, pecans, pine nuts, walnuts, ground flax seeds, hydrated chia seeds, pumpkin seeds  - Red wine: in moderation (one 5-ounce glass per day or less)  - Soy: edamame, soybeans, tofu  - Vegetables: artichokes, asparagus, beets, bell peppers, broccoli, cabbage, carrots, cauliflower, garlic, kale, lettuce, mushrooms, okra, onion, potatoes, spinach, squash, tomatoes    MIND (Mediterranean-DASH Intervention for Neurodegenerative Delay) Diet  This method combines concepts of the Mediterranean and DASH (Dietary Approaches to Stop Hypertension) diets. The MIND diet has been associated with lower rates of Parkinson's and Alzheimer's disease. It recommends consumption of 10 foods:    + Leafy greens: six or more servings per week  Serving size: two cups raw or one cup cooked  Examples: chard, collard greens, kale, lettuce, mixed greens, spinach    + Other vegetables: at least one serving per day  Serving size: one cup raw or cooked  Examples: beets, green or red bell peppers, broccoli, carrots, celery, corn, eggplant, lima beans, peas, potatoes, string beans, summer squash, tomatoes, zucchini    + Berries: two or more servings per week  Serving sizes: one cup raw or ? cup dried  Examples: blackberries, blueberries, raspberries, strawberries    + Poultry: two servings per week  Serving size: 4-6 ounce    + Fish: one serving per week  Serving size: 4-6 ounce    + Beans: at least four servings per week  Serving size: ? cup  Examples: beans, peas and lentils    + Nuts: five servings per week  Serving size: ? ounce  Examples: almonds, hazelnuts, pecans, pine nuts, walnuts    + Olive oil: use as main cooking oil    + Whole grains: three servings per week  Serving sizes and examples: one slice of 100 percent whole grain bread; ? cup cooked oatmeal, rice or pasta; three cups popcorn; one cup whole grain cereal; one 6-inch corn tortilla    + Red wine: up to one 5-ounce glass per day    This diet recommends limiting the following:  + Butter and margarine: less than one tablespoon per day. Instead, try using olive oil as your primary cooking fat, and dipping your bread in olive oil with herbs  + Cheese: less than one 1-ounce serving per week  + Red meat: aim for no more than four servings each week. This includes all beef, pork, lamb and products made from these meats  + [place] food: less than one meal (serving not specified) per week  + Pastries and sweets: less than four servings per week

## 2022-04-15 ENCOUNTER — Encounter: Admit: 2022-04-15 | Discharge: 2022-04-15 | Payer: MEDICARE

## 2022-04-15 ENCOUNTER — Ambulatory Visit: Admit: 2022-04-15 | Discharge: 2022-04-16 | Payer: MEDICARE

## 2022-04-15 DIAGNOSIS — IMO0002 Ulcer: Secondary | ICD-10-CM

## 2022-04-15 DIAGNOSIS — Z95810 Presence of automatic (implantable) cardiac defibrillator: Secondary | ICD-10-CM

## 2022-04-15 DIAGNOSIS — M5417 Radiculopathy, lumbosacral region: Secondary | ICD-10-CM

## 2022-04-15 DIAGNOSIS — I2510 Atherosclerotic heart disease of native coronary artery without angina pectoris: Secondary | ICD-10-CM

## 2022-04-15 DIAGNOSIS — I340 Nonrheumatic mitral (valve) insufficiency: Secondary | ICD-10-CM

## 2022-04-15 DIAGNOSIS — J302 Other seasonal allergic rhinitis: Secondary | ICD-10-CM

## 2022-04-15 DIAGNOSIS — E669 Obesity, unspecified: Secondary | ICD-10-CM

## 2022-04-15 DIAGNOSIS — J449 Chronic obstructive pulmonary disease, unspecified: Secondary | ICD-10-CM

## 2022-04-15 DIAGNOSIS — K5900 Constipation, unspecified: Secondary | ICD-10-CM

## 2022-04-15 DIAGNOSIS — R7303 Prediabetes: Secondary | ICD-10-CM

## 2022-04-15 DIAGNOSIS — M4800 Spinal stenosis, site unspecified: Secondary | ICD-10-CM

## 2022-04-15 DIAGNOSIS — G4733 Obstructive sleep apnea (adult) (pediatric): Secondary | ICD-10-CM

## 2022-04-15 DIAGNOSIS — T148XXA Other injury of unspecified body region, initial encounter: Secondary | ICD-10-CM

## 2022-04-15 DIAGNOSIS — R053 Chronic cough: Secondary | ICD-10-CM

## 2022-04-15 DIAGNOSIS — M1990 Unspecified osteoarthritis, unspecified site: Secondary | ICD-10-CM

## 2022-04-15 DIAGNOSIS — I219 Acute myocardial infarction, unspecified: Secondary | ICD-10-CM

## 2022-04-15 DIAGNOSIS — K449 Diaphragmatic hernia without obstruction or gangrene: Secondary | ICD-10-CM

## 2022-04-15 DIAGNOSIS — I10 Essential (primary) hypertension: Secondary | ICD-10-CM

## 2022-04-15 DIAGNOSIS — M25561 Pain in right knee: Secondary | ICD-10-CM

## 2022-04-15 DIAGNOSIS — M549 Dorsalgia, unspecified: Secondary | ICD-10-CM

## 2022-04-15 DIAGNOSIS — H269 Unspecified cataract: Secondary | ICD-10-CM

## 2022-04-15 DIAGNOSIS — M2550 Pain in unspecified joint: Secondary | ICD-10-CM

## 2022-04-15 DIAGNOSIS — I509 Heart failure, unspecified: Secondary | ICD-10-CM

## 2022-04-15 DIAGNOSIS — I255 Ischemic cardiomyopathy: Secondary | ICD-10-CM

## 2022-04-15 DIAGNOSIS — K219 Gastro-esophageal reflux disease without esophagitis: Secondary | ICD-10-CM

## 2022-04-15 DIAGNOSIS — M5136 Other intervertebral disc degeneration, lumbar region: Secondary | ICD-10-CM

## 2022-04-15 DIAGNOSIS — I4891 Unspecified atrial fibrillation: Secondary | ICD-10-CM

## 2022-04-15 DIAGNOSIS — G5600 Carpal tunnel syndrome, unspecified upper limb: Secondary | ICD-10-CM

## 2022-04-15 DIAGNOSIS — Z9689 Presence of other specified functional implants: Secondary | ICD-10-CM

## 2022-04-15 DIAGNOSIS — M5137 Other intervertebral disc degeneration, lumbosacral region: Secondary | ICD-10-CM

## 2022-04-15 DIAGNOSIS — E785 Hyperlipidemia, unspecified: Secondary | ICD-10-CM

## 2022-04-15 DIAGNOSIS — N529 Male erectile dysfunction, unspecified: Secondary | ICD-10-CM

## 2022-04-15 NOTE — Progress Notes
[facility] Interventional Pain  Subjective     Chief Complaint:   Chief Complaint   Patient presents with   ? Right Hip - Pain   ? Left Hip - Pain   ? Lower Back - Pain   ? Pain       HPI: Johnny [name] is a 76 y.o. 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 who presents for evaluation.     Patient presents to clinic for follow-up of LBP/knee pain.    RIght knee pain  S/P right TKA and revision  50% relief with right genicular RFA      S/P SCS placement on 11/17/2021  Reports 50% relief with SCS  Radiating pain in LLE has improved by 90%  Lower back pain is better in the am, but worsens by evening  Right knee pain continues to have pain      Patient endorses achiness/heaviness of legs with walking a long distance that improves with rest. Positive Shopping Cart sign.     Renne Musca Peloquin denies any recent fevers, chills, infection, antibiotics, bowel or bladder incontinence, saddle anesthesia. +Xarelto for a. fib.  Recent diagnosis of parkinsons    Taking tizanidine, reports mod. pain relief.. Has decreased due to dizziness.        PRIOR MEDICATIONS:   Effective  Acetaminophen (little)  Tizanidine    Ineffective  Gabapentin    Unable to tolerate  NSAID    Never  Lyrica  Ami/Nortriptyline  Cymbalta      PRIOR INTERVENTIONS:  L-spine surgery with hardware L2-3 L3-4 and then later L4-5 and L5-1 next (2 surgeries, most recent in 2008) Right TKA with revision  Effective  Bilateral SIJ (OSH, good benefit)  Left L5-S2 TFESI  right knee RFA  Lumbar RFA      Ineffective  ESI x3 (OSH)      Past Medical History:  Medical History:   Diagnosis Date   ? Atrial fibrillation (HCC) 02/08/2009   ? Back pain    ? CAD (coronary artery disease) 02/08/2009   ? Carpal tunnel syndrome    ? Cataract 2017    both removed   ? Chronic cough    ? Congestive heart disease (HCC)    ? Constipation 2015   ? COPD (chronic obstructive pulmonary disease) (HCC)    ? Degenerative disc disease, lumbar 2010   ? Erectile dysfunction, vasculogenic    ? Fracture    ? GERD (gastroesophageal reflux disease)     controlled with elevated HOB, protonix and pepcid    ? Heart attack [facility]) possible in 2006   ? Hiatal hernia    ? Hyperlipemia 02/11/2009   ? Hypertension 02/11/2009   ? 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 Joint pain 1966   ? Mitral regurgitation    ? Myocardial  infarction [facility]) 2006   ? Obesity, Class I, 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)    ? OSA on CPAP 02/11/2009   ? Osteoarthritis    ? Pre-diabetes    ? S/P ICD (internal cardiac defibrillator) procedure 02/11/2009   ? Seasonal allergies    ? Spinal stenosis 2010   ? Ulcer        Family History:  Family History   Problem Relation Age of Onset   ? Cancer Father    ? Hypertension Mother    ? Joint Pain Mother        Social History:  Lives in [place] [id number] (1.25 hours away)    Social History     Socioeconomic History   ? Marital status: Married   Tobacco Use   ? Smoking status: Never   ? Smokeless tobacco: Never   Vaping Use   ? Vaping Use: Never used   Substance and Sexual Activity   ? Alcohol use: No     Comment: very rarely 4 drinks per year   ? Drug use: No   ? Sexual activity: Yes     Partners: Female     Birth control/protection: None       Allergies:  Allergies   Allergen Reactions   ? Clarithromycin RASH     Allergy recorded in SMS: Biaxin~Reactions: RASH~THRUSH   ? Ketoconazole SEE COMMENTS     Had bloodshot eyes after use, and sore.   ? Lisinopril COUGH   ? Oxycodone UNKNOWN     Nightmare       Medications:    Current Outpatient Medications:   ?  acetaminophen (TYLENOL) 325 mg tablet, Take two tablets by mouth every 4 hours as needed for Pain., Disp: 300 tablet, Rfl: 1  ?  albuterol (VENTOLIN HFA, PROAIR HFA) 90 mcg/actuation inhaler, Inhale two puffs by mouth into the lungs four times daily as needed for Wheezing., Disp: , Rfl:   ?  aspirin 81 mg chewable tablet, Take 1 Tab by mouth daily. WAIT to restart until finished taking lovenox injections. (Patient taking differently: Chew one tablet by mouth at bedtime daily.), Disp: 90 Tab, Rfl: 3  ?  carbidopa/levodopa (SINEMET) 25/100 mg tablet, TAKE 1 TABLET BY MOUTH THREE TIMES DAILY, Disp: 90 tablet, Rfl: 0  ?  cetirizine (ZYRTEC) 10 mg tablet, Take one tablet by mouth daily., Disp: , Rfl:   ?  cholecalciferol (Vitamin D3) (VITAMIN D-3) 1,000 units tablet, Take one tablet by mouth twice daily.  , Disp: , Rfl:   ?  dextromethorphan/guaiFENesin (MUCINEX DM) 30/600 mg Tb12, Take two tablets by mouth daily as needed., Disp: , Rfl:   ?  docusate (COLACE) 100 mg capsule, Take two capsules by mouth at bedtime daily. Pt is taking 550 mg daily, Disp: , Rfl:   ?  ezetimibe (ZETIA) 10 mg tablet, TAKE 1 TABLET BY MOUTH AT BEDTIME, Disp: 90 tablet, Rfl: 2  ?  famotidine (PEPCID) 40 mg tablet, Take one tablet by mouth at bedtime daily., Disp: , Rfl:   ?  fexofenadine(+) (ALLEGRA) 180 mg tablet, Take one tablet by mouth daily., Disp: , Rfl:   ?  fluticasone-umeclidin-vilanter (TRELEGY ELLIPTA) 100-62.5-25 mcg inhaler, Inhale 1 Dose by mouth into the lungs at bedtime daily., Disp: , Rfl:   ?  furosemide (LASIX) 40 mg tablet, TAKE 2 TABLETS BY MOUTH DAILY TAKE 3 TABLETS ON MONDAY/WEDNESDAY/ FRIDAY AS DIRECTED BY CARDIOLOGY, Disp: 270 tablet, Rfl: 2  ?  glucosamine su 2KCl-chondroit 500-400  mg tab, Take 1 Tab by mouth twice daily., Disp: , Rfl:   ?  ipratropium bromide (ATROVENT) 42 mcg (0.06 %) nasal spray, Apply two sprays to each nostril as directed twice daily as needed., Disp: , Rfl:   ?  losartan (COZAAR) 50 mg tablet, Take one tablet by mouth daily., Disp: 90 tablet, Rfl: 3  ?  metoprolol succinate XL (TOPROL XL) 100 mg extended release tablet, TAKE 1 TABLET BY MOUTH EVERY DAY, Disp: 90 tablet, Rfl: 3  ?  mucus clearing device (AEROBIKA OSCILLATING PEP SYSTM MISC), Use  as directed. Use with salt water and inhale twice daily, Disp: , Rfl:   ?  MYRBETRIQ 50 mg tablet, Take one tablet by mouth daily. Does not take on days he takes Ditropan, Disp: , Rfl:   ?  OMEGA-3 FATTY ACIDS-FISH OIL PO, Take 1 capsule by mouth twice daily., Disp: , Rfl:   ?  other medication, mupirocin ointment and budesonide solution mixed with warm water Use as nasal irrigation twice daily, Disp: , Rfl:   ?  pantoprazole DR (PROTONIX) 40 mg tablet, Take one tablet by mouth twice daily., Disp: , Rfl:   ?  polyethylene glycol 3350 (GLYCOLAX; MIRALAX) 17 gram/dose powder, Take 17 g by mouth daily. (Patient taking differently: Take seventeen g by mouth nightly as needed.), Disp: 595 g, Rfl: 3  ?  potassium chloride SR (K-DUR) 20 mEq tablet, TAKE 2 TABLETS BY MOUTH ON MONDAY, WEDNESDAY AND FRIDAY AND TAKE 1 TABLET ALL OTHER DAYS, Disp: 140 tablet, Rfl: 2  ?  predniSONE (DELTASONE) 10 mg tablet, Take one tablet by mouth daily., Disp: , Rfl:   ?  pyRIDostigmine bromide (MESTINON) 60 mg tablet, Take one tablet by mouth three times daily. 1.5 tablets 3 times daily, Disp: , Rfl:   ?  rivaroxaban (XARELTO PO), Take  by mouth., Disp: , Rfl:   ?  rosuvastatin (CRESTOR) 40 mg tablet, TAKE 1 TABLET BY MOUTH AT BEDTIME, Disp: 90 tablet, Rfl: 3  ?  spironolactone (ALDACTONE) 25 mg tablet, TAKE 1 TABLET BY MOUTH EVERY DAY TAKE WITH FOOD, Disp: 90 tablet, Rfl: 3  ?  testosterone cypionate 200 mg/mL kit, Inject 1 mL into the muscle every 14 days., Disp: , Rfl:   ?  tiZANidine (ZANAFLEX) 2 mg tablet, TAKE 1 TO 3 TABLETS BY MOUTH AT BEDTIME AS NEEDED, Disp: 90 tablet, Rfl: 2  ?  traMADoL (ULTRAM) 50 mg tablet, Take one tablet by mouth every 8 hours as needed for Pain., Disp: 30 tablet, Rfl: 0  ?  traZODone (DESYREL) 100 mg tablet, bedtime, Disp: , Rfl:   ?  Vacuum Erection Device System kit, Use as directed for sexual activity., Disp: 1 kit, Rfl: 11    Physical examination:   BP 129/78  - Pulse 86  - Temp 36.8 ?C (98.3 ?F)  - Ht 180.3 cm (5' 11)  - Wt 108.9 kg (240 lb)  - SpO2 96%  - BMI 33.47 kg/m?   Pain Score: Four    General: The patient is a well-developed, well nourished 76 y.o. male in no acute distress.   HEENT: Head is normocephalic and atraumatic. EOMI bilaterally.   Cardiac: Based on palpation, pulse appears to be regular rate and rhythm.   Pulmonary: The patient has unlabored respirations and bilateral symmetric chest excursion.   Abdomen: Soft, nontender, and nondistended. There is no rebound or guarding.   Extremities: No clubbing, cyanosis, or edema. Mild TTP to diffuse anterior right knee, minimal  posterior knee pain.     Neurologic:   The patient is alert and oriented times 3.   Cranial nerves II through XII are intact without any focal deficits.     Musculoskeletal:   Walks with cane.     L-Spine   There is mild low lumbar paraspinal tenderness. Paraspinal muscle tone is increased.  Facet loading is positive.  ROM with flexion, extension, rotation, and lateral bending is intact.  Strength is equal and adequate bilaterally in the flexors and extensors of the bilateral lower extremities.   SLR is negative bilaterally.    Midline and pocket incision CDI, well healed and well approximated, no evidence of swelling, erythema or warmth.        Results for orders placed during the hospital encounter of 08/24/20    MRI L-SPINE WO/W CONTRAST    Addendum 08/25/2020  5:39 PM  Finalized by Ivory Broad, M.D. on 08/24/2020 5:00 PM. Dictated by Delfin Gant, M.D. on 08/24/2020 3:54 PM.Addendum:  Delfin Gant discussed these findings via telephone with Dr. Francee Nodal nurse, Vassie Moment, at 8:34 AM on 08/25/2020.      Approved by Delfin Gant, M.D. on 08/25/2020 8:35 AM    By my electronic signature, I attest that I have personally reviewed the images for this examination and formulated the interpretations and opinions expressed in this report      Finalized by Ivory Broad, M.D. on 08/25/2020 5:36 PM. Dictated by Delfin Gant, M.D. on 08/25/2020 8:33 AM.    Narrative  EXAM: MRI L-SPINE    HISTORY:    , lumbar radiculapathy,    Technique: Multiple sagittal and axial MR sequences were obtained of the lumbar spine with and without MultiHance contrast.    Comparison: CT lumbar spine August 27, 2014    FINDINGS:    There are 5 nonrib-bearing lumbar type vertebral bodies. There is left convexity lumbar spinal curvature. Trace retrolisthesis at L2-L3 and trace anterolisthesis of L5-S1. X-Stop devices are seen at the spinous processes of the lumbar spine. Vertebral body heights are maintained. Multilevel vertebral endplate marrow changes. No suspicious bone marrow replacing lesion. The conus is normal in appearance and position at the T12-L1 level. abnormal enhancing lesion is identified. Bilateral renal atrophy. Small upper pole right renal cyst. There is an additional 1.3 cm upper pole right renal lesion which demonstrates T2 and T1 hypointensity (series 5, image 8).    T12-L1: No significant central spinal or neuroforaminal stenosis.    L1-L2: Marked disc degeneration, mild circumferential disc bulge, and facet hypertrophy. Mild central spinal stenosis. Moderate left and marked right neural foraminal stenosis.    L2-L3: Trace retrolisthesis, moderate disc degeneration, circumferential disc bulge, facet hypertrophy, and mild malignant flavum thickening. Mild central spinal stenosis. Moderate left and marked right neuroforaminal stenosis.    L3-L4: Moderate disc degeneration, circumferential disc bulge, and facet hypertrophy. Moderate to marked spinal stenosis. Marked bilateral neuroforaminal stenosis.    L4-L5: Mild disc degeneration, circumferential disc bulge with superimposed central disc protrusion, ligamentum flavum thickening, and facet hypertrophy. Moderate to marked central spinal stenosis. Marked bilateral neural foraminal stenosis.    L5-S1: Mild disc degeneration, circumferential disc bulge, and facet hypertrophy. Moderate central spinal stenosis. Marked bilateral neural foraminal stenosis.    Impression  1.  Multilevel degenerative central spinal stenosis, greatest of moderate to marked degree at L3-L4, L4-L5, and L5-S1.  2.  Multilevel degenerative neuroforaminal stenosis, greatest of marked degree on the right at L1-L2 and L2-L3 as well as bilaterally at L3-L4,  L4-5, and L5-S1.  3.  Left convexity lumbar spinal curvature.  4.  Trace retrolisthesis of L2-L3 and trace anterolisthesis of L5-S1.  5.  T2 and T1 hypointense peripheral right renal lesion. MRI abdomen (renal mass protocol) is recommended for further evaluation.    By my electronic signature, I attest that I have personally reviewed the images for this examination and formulated the interpretations and opinions expressed in this report          Last Cr and LFT's:  Creatinine   Date Value Ref Range Status   03/31/2021 1.30 (H) 0.4 - 1.24 MG/DL Final     AST (SGOT)   Date Value Ref Range Status   11/22/2018 25 7 - 40 U/L Final     ALT (SGPT)   Date Value Ref Range Status   11/22/2018 24 7 - 56 U/L Final     Alk Phosphatase   Date Value Ref Range Status   11/22/2018 51 25 - 110 U/L Final     Total Bilirubin   Date Value Ref Range Status   11/22/2018 0.8 0.3 - 1.2 MG/DL Final          Assessment:    Johnny [name] is a 76 y.o. 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 who presents for evaluation of pain.    The pain complaints are most likely due to:    1. Chronic pain of right knee  DESTRUCTION OF NERVE W/ FLUORO PULSED RADIOFREQ      2. Lumbosacral radiculopathy        3. DDD (degenerative disc disease), lumbosacral        4. Spinal cord stimulator status            Patient has had an adequate trial of > 12 months of rest, exercise, multimodal treatment, and the passage of time without improvement of symptoms. The pain has significant impact on the daily quality of life.     Plan:    1.Discussed care options with patient. Plan on repeat right genicular RFA  at next available appointment.  2.Continue tizanidine as prescribed  3. Encouraged to continue at home PT program.  4. Follow up genicular RFA      Risks/benefits of all pharmacologic and interventional treatments discussed and questions answered.

## 2022-04-16 DIAGNOSIS — G8929 Other chronic pain: Secondary | ICD-10-CM

## 2022-05-02 ENCOUNTER — Encounter: Admit: 2022-05-02 | Discharge: 2022-05-02 | Payer: MEDICARE

## 2022-05-02 MED ORDER — LOSARTAN 50 MG PO TAB
ORAL_TABLET | ORAL | 2 refills | 90.00000 days | Status: AC
Start: 2022-05-02 — End: ?

## 2022-05-09 ENCOUNTER — Encounter: Admit: 2022-05-09 | Discharge: 2022-05-09 | Payer: MEDICARE

## 2022-05-09 MED ORDER — GABAPENTIN 300 MG PO CAP
300 mg | ORAL_CAPSULE | Freq: Three times a day (TID) | ORAL | 3 refills | Status: AC
Start: 2022-05-09 — End: ?

## 2022-05-09 NOTE — Progress Notes
Received refill request for gabapentin. Pt says that his current prescription has expired, confirmed dose. Medication was listed in office note from January 2023, but not July 2023. Dr. Nedra Hai, is he ok to continue this medication? Pended for you to approve.

## 2022-05-16 ENCOUNTER — Encounter: Admit: 2022-05-16 | Discharge: 2022-05-16 | Payer: MEDICARE

## 2022-05-16 ENCOUNTER — Ambulatory Visit: Admit: 2022-05-16 | Discharge: 2022-05-16 | Payer: MEDICARE

## 2022-05-16 DIAGNOSIS — E7800 Pure hypercholesterolemia, unspecified: Secondary | ICD-10-CM

## 2022-05-16 DIAGNOSIS — Z95810 Presence of automatic (implantable) cardiac defibrillator: Secondary | ICD-10-CM

## 2022-05-16 DIAGNOSIS — Z136 Encounter for screening for cardiovascular disorders: Secondary | ICD-10-CM

## 2022-05-16 DIAGNOSIS — I255 Ischemic cardiomyopathy: Secondary | ICD-10-CM

## 2022-05-16 DIAGNOSIS — I10 Essential (primary) hypertension: Secondary | ICD-10-CM

## 2022-05-16 DIAGNOSIS — I340 Nonrheumatic mitral (valve) insufficiency: Secondary | ICD-10-CM

## 2022-05-16 DIAGNOSIS — I480 Paroxysmal atrial fibrillation: Secondary | ICD-10-CM

## 2022-05-16 DIAGNOSIS — I519 Heart disease, unspecified: Secondary | ICD-10-CM

## 2022-05-16 NOTE — Progress Notes
Date of Service: 05/16/2022    Johnny [name] Johnny [name] is a 76 y.o. male.       HPI     Johnny [name] presents for interim evaluation reduced exercise tolerance and  DOE now 17 years after his 5 vessel bypass with mitral valvuloplasty as treatments subsequent to evaluation of murmur and dyspnea.  He had what appeared to be persisting LV systolic dysfunction and had ICD implant early after his bypass and valve replacement surgery.  He also had paroxysmal atrial fibrillation with prior ablation.  Since then his ejection fraction has improved and he has never had findings of failed valve repair or significant ischemic heart disease.  He has COPD and sleep apnea issues that are followed locally and at [facility] in [place].  The first thing he told me today was that he had had a DIplopia evaluation by ophthalmologist Dr. Valarie Merino who believed that he needed prednisone for myasthenia gravis.  He has not talked to one of the motion disorder neurologist at [facility] about the testing for myasthenia gravis that she did at Dr. Joneen Boers request.  She will see him if a consult was placed but her ophthalmologist did not request a consult.    Johnny [name] had echo and Doppler July 5 ordered by Dr. Bradly Bienenstock with normal LV size and function.  EF 61% Simpson's biplane.  Mitral valvuloplasty ring evidently no stenosis trace mitral regurgitation.  Mean gradient 2mm.   PA pressure  minimally elevated at 35 indicating mild pulmonary hypertension.  RV was mildly dilated with mild reduction in LV systolic function.  No aortic or mitral regurgitation but moderate pulmonary regurgitation.    He walks 6-8 blocks along the river in [place] 2-3 times a week. He is here with his wife Johnny [name] today. He and his wife enjoy the pool at [place] but is close for renovation.    He denies having typical symptoms suggesting the presence of angina, heart failure, TIA, claudication or arrhythmias.         Vitals:    05/16/22 1344   BP: 128/80   BP Source: Arm, Left Upper   Pulse: 56   Weight: 110.7 kg (244 lb)   Height: 180.3 cm (5' 11)     Body mass index is 34.03 kg/m?Marland Kitchen     Past Medical History  Patient Active Problem List    Diagnosis Date Noted   ? 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  04/14/05 #28 [place] ring, Resect P2 Seg Post leaflet,ring.as above w/ CABG-[place]     ? 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     Priority: High     a. 04/14/05: CABG x 5 LIMA-LAD, L Rad-Dx, sSVG-RCA-RCA, sSVG-OM & MV Plasty for MR  with initial trigger for evaluation being murmur and dyspnea  b. 12/13/05 EF 35% by aden thall, Mixed defect in Cx distribution. Referred for ICD   c. Rash w/ Cleda Daub, switch to Inspra  d. 6/07 Removal of all 8 sternal wires and parasternal lipoma d/t persisting pain  E. Jan, 2011 Regaden Simonne Maffucci. Limited inferior Defect, unchanged from 2007. EF 48%  F. 10/08/13: Reg/Thall Stress: EF 48% No ischemia or change from prior studies.    January 15, 2018  Lexiscan sestamibi study;  sinus rhythm, no arrhythmias or EKG changes    EF was 57% with normal wall motion and normal perfusion. [employer]      ? Atrial Fib Paroxysmal  (  HCC) 02/08/2009     Priority: High     a. 7/06 Early post CAB Afib, Brief amio pre dismissal. Post DC AF still rapid, admit [facility]  b. 05/23/05 TEE: No LA clot, EF 25% Cvert to SR on Ticosyn 500 BID, dec'd to 250 BID w/             QTC@ , QTc to 470. Home 05/26/05  c. 11/29/05 Ticosyn DC'd, Dr. Hale Bogus p recurrent afib on ticosyn  D. 12/14 NSR on clinic exam, MAC OV (CBP)  E. 05/16/16 ICD interrogation: 6.2% atrial fibrillation burden, on warfarin   F 11/22/2018   Paroxysmal atrial fibrillation RF ablation  Dr Bradly Bienenstock         ? 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 Gretchen Short  12/17/13 Generator at [facility],  Fidelis RV-ICD lead extraction w/ laser assistance,Implant Medtronic Evera XT ICD and new RV lead. RA lead left i tact. DFTs done. Dr. Naoma Diener     ? Hyperlipemia 02/11/2009     Priority: Medium     HIghest total cholesterol he recalls pre treatment: 270  a. 7/06 Rx Vytorin 10/40, then to 10/80 approx 04/28/05. Recalls knee pain w/ lovastatin  b. 08/03/05 total 156 trig 118 HDL 39 LDL 95  C. 02/09/09 total 148 trig 114  HDL 43  LDL 81 Vytorin 10/80 Fishoil  >>Starts Lipitor 80 + Zetia March 2011     ? Sleep apnea 02/11/2009     Priority: Medium     Sleep apnea      a. 2/07 Using CPAP, good tolerance and benefit after Sleep study approx 2003 Dr. Andreas Newport     ? Hypertension 02/11/2009     Priority: Medium     Hypertension        a. ACE cough on lisinopril, olmesartan 20 well tolerated      ? 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 7/06 Carotid US mod disease < 50% sten     ? DOE (dyspnea on exertion) 03/16/2022   ? 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 Myasthenia gravis (HCC) 03/16/2022   ? Overactive bladder (OAB)      Urinary frequency, urgency.     ? Back pain    ? 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 OSA (obstructive sleep apnea) 12/30/2020     CPAP titration (03/13/20);  6 CM H20 recommended  PLMI 4.4, PLMAI 0.6  Increased muscle tone noted during some epochs of REM    Split night sleep study (04/16/21):  AHI 11.6 (CAI 0), SpO2 min 83%, 6.3 min less than or equal to 88%  CPAP 9 cm H20  PLMI Diagnostic portion 92.3 , PLMI Treatment Portion 18/hr without arousals       ? 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 Erectile dysfunction, vasculogenic    ? Beta blocker prescribed for left ventricular systolic dysfunction 07/14/2020     Patient on beta-blocker ARB and Aldo blocker because of EF down to 35% with referral for ICD.  EF has improved but patient needs to continue HFrEF oriented medical therapy     ? Visit for monitoring Tikosyn therapy 11/22/2018   ? Atrial fibrillation (HCC) 11/22/2018   ? VT (ventricular tachycardia) (HCC) 02/28/2018     02/19/2020 -  Regadenoson MPI:  Small basal to mid inferolateral wall  fixed perfusion defec with corresponding hypokinesis on gated images suggestive of myocardial injury & small size, mild basal anterolateral wall  predominantly reversible perfusion defect  Corresponding regional wall motion is normal.  All myocardial segments appear viable.EF 46%.  High risk scintigraphic features are absent.      ? 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 Encounter for anticoagulation discussion and counseling 11/28/2014   ? Constipation due to opioid therapy 09/05/2014   ? Closed fracture of multiple ribs of left side 08/29/2014   ? 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 Left arm swelling 04/01/2014         Review of Systems   Constitutional: Negative.   HENT: Negative.    Eyes: Negative.    Cardiovascular: Negative.    Respiratory: Negative.    Endocrine: Negative.    Hematologic/Lymphatic: Negative.    Skin: Negative.    Musculoskeletal: Negative.    Gastrointestinal: Negative.    Genitourinary: Negative.    Neurological: Negative.    Psychiatric/Behavioral: Negative.    Allergic/Immunologic: Negative.    All other systems reviewed and are negative.      Physical Exam  Appearance:Overweight, but appears otherwise healthy, no distress   Skin: Not pale or icteric, skin warm and dry Eyes: Pupils equal and round Thyroid: not enlarged   Carotids: Normal upstrokes, no bruits Neck Veins: CVP <8, No V wave he may have slight positive HJR.  I did not think this was present as a finding when I saw him last time   chest/thorax: Breathing comfortably. Lungs clear to percussion and auscultation. No rales, rhonchi or wheezing   Cardiac: Rhythm regular. S1 and S2 normal with fourth heart sound, no rub or third sound. Gr i/vi apical systolic murmur   Abdomen: soft, non-tender, no masses. Normal bowel sounds. Liver not enlarged. No abdominal bruit, aorta not palpable   Pulses: Femorals: Normal pulses, no bruits Pedals 1+PT pulses, No HJR.    Leg/ankle edema:minimal biilateral lower leg Neuro/Motor: Normal strength & gross neurologic function all extremities, normal speech & hearing   Neuro/Cognition: Good insight, clear historian, no depression    Cardiovascular Studies  Today's 12 lead EKG: sinus rhythm, rate 80.  Frequent APCs. Right bundle branch block   The 10-year ASCVD risk score (Arnett DK, et al., 2019) is: 24.7%*    Values used to calculate the score:      Age: 17 years      Sex: Male      Is Non-Hispanic African American: No      Diabetic: No      Tobacco smoker: No      Systolic Blood Pressure: 128 mmHg      Is BP treated: Yes      HDL Cholesterol: 55 mg/dL*      Total Cholesterol: 138 mg/dL*      * - Cholesterol units were assumed for this score calculation    Cardiovascular Health Factors  Vitals BP Readings from Last 3 Encounters:   05/16/22 128/80   04/15/22 129/78   04/13/22 115/65     Wt Readings from Last 3 Encounters:   05/16/22 110.7 kg (244 lb)   04/15/22 108.9 kg (240 lb)   04/13/22 111.1 kg (245 lb)     BMI Readings from Last 3 Encounters:   05/16/22 34.03 kg/m?   04/15/22 33.47 kg/m?   04/13/22 34.17 kg/m?      Smoking Social  History     Tobacco Use   Smoking Status Never   Smokeless Tobacco Never      Lipid Profile Cholesterol   Date Value Ref Range Status   06/09/2020 138  Final     HDL   Date Value Ref Range Status   06/09/2020 55  Final     LDL   Date Value Ref Range Status   06/09/2020 60  Final     Triglycerides   Date Value Ref Range Status   06/09/2020 117  Final      Blood Sugar Hemoglobin A1C   Date Value Ref Range Status   09/23/2010 6.2       Glucose   Date Value Ref Range Status   03/31/2021 102 (H) 70 - 100 MG/DL Final   04/54/0981 191 (H) 70 - 100 MG/DL Final   47/82/9562 130 (H) 70 - 105 Final   03/15/2006 123 (H) 70 - 110 MG/DL Final   86/57/8469 99 70 - 110 MG/DL Final   62/95/2841 324 70 - 110 MG/DL Final     Glucose, POC   Date Value Ref Range Status   08/28/2014 124 (H) 70 - 100 MG/DL Final   40/06/2724 366 (H) 70 - 110 MG/DL Final   44/11/4740 595 (H) 70 - 110 MG/DL Final          Problems Addressed Today  Encounter Diagnoses   Name Primary?   ? Atrial Fib Paroxysmal  (HCC) Yes   ? Screening for heart disease        Assessment and Plan     I do not think cancer is having symptoms of angina arrhythmia or heart failure.  We will we discussed his echo Doppler that was recently done that showed mild elevation in PA pressure and some elements of RV dilation and dysfunction that could all be due to elevated pulmonary pressures from intrinsic lung disease.  He was scheduled to have an echo Doppler at [facility] when he goes there next month.  I gave him a printed copy of our report and told him that its unlikely that an echo Doppler done at [facility] will improve decision making over what can be done using our echo results from here.  They can probably do an echo Doppler on short notice up there if they see him and feel that additional cardiopulmonary imaging through the echo Doppler is needed.    He had tried to access his LDL readings recently done [facility] but could not.  I asked him to call these back into [place] when he gets them.  Spoke with a little bit to get him to realize that giving [place] a direct report of these readings is more effective than writing them electronically in the [facility] system where they would be filed in 1 of multiple areas for incoming documents that I may not successfully identify even after spending time sorting through the chart.    I do not think there is anything that needs to be done immediately on his cardiac function.  I do not think that his intrinsic left heart disease is contributing to his elevations in PA pressure.  I can see him again in a year sooner if needed.     40   minutes as the total time for this encounter.  The time was spent reviewing records,  interviewing patient, doing exam, developing diagnosis, creating treatment plan written in patient oriented  terminology  for the AVS, explaining it to  the patient and entering further information in the EMR.      NB: The free text in this document was generated through Dragon(TM) software with editing and proofreading  done by the author of this document Dr. Mable Paris MD, [facility] principally at the point of care. Some errors may persist. If there are questions about content in this document please contact Dr. Hale Bogus.  The written information I provided the patient at the conclusion of today's encounter is as follows   Patient Instructions   Johnny [name], I don't believe that you are having major issues with yoiur heart function or structure.  The echo showed mild elevations in the pressures in your heart but these are consistently present from 1 echo to another and the minor degree of abnormality that you had     Your cholesterol LDL was 6o two years ago &  probably has not changed much but would be good to get a fasting cholesterol profile soon.  Bring it down by hand when you come in rather thanhave [place] look for is somewhere find it by computer.    If you can get the results put in on her hands and my nurse will have it to send to me.  If he gets electronically filed it is hard to find and I do not even know if it sent there.  There is no alert to me to know to look for something no specific place I have to look so its about 4 different places and 80% the time is to complete waste of my effort.  Just tell you this because if you hang onto it and get it directly to my nurse by phone or by MyChart then it will be inside my system and I will see it.    I I do not know that there is anything to do differently if I see you in 6 months.  See what they say at [facility] and if they think some things going on with your heart let me know but I think basically you could go just as you are for years and do fine    Return to see me for a recheck in one year.   I can see you sooner if needed.   Use MyChart to message me or if necessary call in if you have problems or questions  Marissa Nestle, MD         Your echo showed some pressure increases on the right side of your heart which could be due to your lung disease.  The doctors at [facility] do not need to redo the echo.  What I think they will want to do for you is look over your lung situation and see if there is anything they can do to help your lungs improved some and give less pressure to the heart because of high pressures  in the lungs.    None of the pressures in your lungs are all all that profoundly abnormal.  Year pressure in the pulmonary artery was 35 with upper limit of normal 30.  A lot of times if you did an invasive study to measure it exactly it would be normal when the reading is just 35.                 Current Medications (including today's revisions)  ? acetaminophen (TYLENOL) 325 mg tablet Take two tablets by mouth every 4 hours as needed for Pain.   ? albuterol (VENTOLIN HFA,  PROAIR HFA) 90 mcg/actuation inhaler Inhale two puffs by mouth into the lungs four times daily as needed for Wheezing.   ? aspirin 81 mg chewable tablet Take 1 Tab by mouth daily. WAIT to restart until finished taking lovenox injections. (Patient taking differently: Chew one tablet by mouth at bedtime daily.)   ? carbidopa/levodopa (SINEMET) 25/100 mg tablet TAKE 1 TABLET BY MOUTH THREE TIMES DAILY   ? cetirizine (ZYRTEC) 10 mg tablet Take one tablet by mouth daily.   ? cholecalciferol (Vitamin D3) (VITAMIN D-3) 1,000 units tablet Take one tablet by mouth twice daily.     ? dextromethorphan/guaiFENesin (MUCINEX DM) 30/600 mg Tb12 Take two tablets by mouth daily as needed.   ? docusate (COLACE) 100 mg capsule Take two capsules by mouth at bedtime daily. Pt is taking 550 mg daily   ? ezetimibe (ZETIA) 10 mg tablet TAKE 1 TABLET BY MOUTH AT BEDTIME   ? famotidine (PEPCID) 40 mg tablet Take one tablet by mouth at bedtime daily.   ? fexofenadine(+) (ALLEGRA) 180 mg tablet Take one tablet by mouth daily.   ? fluticasone-umeclidin-vilanter (TRELEGY ELLIPTA) 100-62.5-25 mcg inhaler Inhale 1 Dose by mouth into the lungs at bedtime daily.   ? furosemide (LASIX) 40 mg tablet TAKE 2 TABLETS BY MOUTH DAILY TAKE 3 TABLETS ON MONDAY/WEDNESDAY/ FRIDAY AS DIRECTED BY CARDIOLOGY   ? gabapentin (NEURONTIN) 300 mg capsule Take one capsule by mouth three times daily.   ? glucosamine su 2KCl-chondroit 500-400 mg tab Take 1 Tab by mouth twice daily.   ? ipratropium bromide (ATROVENT) 42 mcg (0.06 %) nasal spray Apply two sprays to each nostril as directed twice daily as needed.   ? losartan (COZAAR) 50 mg tablet TAKE 1 TABLET BY MOUTH DAILY   ? metoprolol succinate XL (TOPROL XL) 100 mg extended release tablet TAKE 1 TABLET BY MOUTH EVERY DAY   ? mucus clearing device (AEROBIKA OSCILLATING PEP SYSTM MISC) Use  as directed. Use with salt water and inhale twice daily   ? MYRBETRIQ 50 mg tablet Take one tablet by mouth daily. Does not take on days he takes Ditropan   ? OMEGA-3 FATTY ACIDS-FISH OIL PO Take 1 capsule by mouth twice daily.   ? other medication mupirocin ointment and budesonide solution mixed with warm water  Use as nasal irrigation twice daily   ? pantoprazole DR (PROTONIX) 40 mg tablet Take one tablet by mouth twice daily.   ? polyethylene glycol 3350 (GLYCOLAX; MIRALAX) 17 gram/dose powder Take 17 g by mouth daily. (Patient taking differently: Take seventeen g by mouth nightly as needed.)   ? potassium chloride SR (K-DUR) 20 mEq tablet TAKE 2 TABLETS BY MOUTH ON MONDAY, WEDNESDAY AND FRIDAY AND TAKE 1 TABLET ALL OTHER DAYS   ? predniSONE (DELTASONE) 10 mg tablet Take one tablet by mouth daily.   ? pyRIDostigmine bromide (MESTINON) 60 mg tablet Take one tablet by mouth three times daily. 1.5 tablets 3 times daily   ? rivaroxaban (XARELTO PO) Take  by mouth.   ? rosuvastatin (CRESTOR) 40 mg tablet TAKE 1 TABLET BY MOUTH AT BEDTIME   ? spironolactone (ALDACTONE) 25 mg tablet TAKE 1 TABLET BY MOUTH EVERY DAY TAKE WITH FOOD   ? testosterone cypionate 200 mg/mL kit Inject 1 mL into the muscle every 14 days.   ? tiZANidine (ZANAFLEX) 2 mg tablet TAKE 1 TO 3 TABLETS BY MOUTH AT BEDTIME AS NEEDED   ? traMADoL (ULTRAM) 50 mg tablet Take one tablet by mouth  every 8 hours as needed for Pain.   ? traZODone (DESYREL) 100 mg tablet bedtime   ? Vacuum Erection Device System kit Use as directed for sexual activity.

## 2022-05-17 ENCOUNTER — Encounter: Admit: 2022-05-17 | Discharge: 2022-05-17 | Payer: MEDICARE

## 2022-05-17 DIAGNOSIS — G7000 Myasthenia gravis without (acute) exacerbation: Secondary | ICD-10-CM

## 2022-05-18 ENCOUNTER — Encounter: Admit: 2022-05-18 | Discharge: 2022-05-18 | Payer: MEDICARE

## 2022-05-19 ENCOUNTER — Encounter: Admit: 2022-05-19 | Discharge: 2022-05-19 | Payer: MEDICARE

## 2022-05-19 MED ORDER — TIZANIDINE 2 MG PO TAB
ORAL_TABLET | 1 refills
Start: 2022-05-19 — End: ?

## 2022-05-27 ENCOUNTER — Encounter: Admit: 2022-05-27 | Discharge: 2022-05-27 | Payer: MEDICARE

## 2022-06-10 ENCOUNTER — Encounter: Admit: 2022-06-10 | Discharge: 2022-06-10 | Payer: MEDICARE

## 2022-06-12 NOTE — Progress Notes
[facility]  INTERVENTIONAL PAIN PROCEDURE HISTORY AND PHYSICAL    Chief Complaint: Pain    HISTORY OF PRESENT ILLNESS:  Johnny [name] presents today for interventional treatment of his pain. He denies any fevers, chills, infection, or current use of contraindicated anticoagulants. Risks of the procedure were discussed including but not limited to bleeding, infection, damage to surrounding structures and reaction to medications. He reports understanding and has elected to proceed with the procedure.     Radiofrequency Ablation Checklist:    If repeat radiofrequency ablation   50% improvement for at least 6 months [x]        50% consistent relief for at least 6 months [x]     Radiofrequency ablation completed on same level over 2 years ago   No    Oswestry:             Medical History:   Diagnosis Date   ? Atrial fibrillation (HCC) 02/08/2009   ? Back pain    ? CAD (coronary artery disease) 02/08/2009   ? Carpal tunnel syndrome    ? Cataract 2017    both removed   ? Chronic cough    ? Congestive heart disease (HCC)    ? Constipation 2015   ? COPD (chronic obstructive pulmonary disease) (HCC)    ? Degenerative disc disease, lumbar 2010   ? Erectile dysfunction, vasculogenic    ? Fracture    ? GERD (gastroesophageal reflux disease)     controlled with elevated HOB, protonix and pepcid    ? Heart attack [facility]) possible in 2006   ? Hiatal hernia    ? Hyperlipemia 02/11/2009   ? Hypertension 02/11/2009   ? 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 Joint pain 1966   ? Mitral regurgitation    ? Myocardial infarction [facility]) 2006   ? Obesity, Class I, 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)    ? OSA on CPAP 02/11/2009   ? Osteoarthritis    ? Pre-diabetes    ? S/P ICD (internal cardiac defibrillator) procedure 02/11/2009   ? Seasonal allergies    ? Spinal stenosis 2010   ? Ulcer        Surgical History:   Procedure Laterality Date   ? HUMERUS FRACTURE SURGERY Left 2004   ? MITRAL VALVULOPLASTY  2006    same time as bypass   ? CORONARY ARTERY BYPASS GRAFT  04/14/2005    CABGx5 LIMA-LAD, L Rad-Dx, sSVG-RCA-RCA, sSVG-OM & MV Plasty for MR:   ? RHYTHM DEVICE PLACEMENT  2007    defibrillator   ? FOOT SURGERY Bilateral 2012    Tarsal tunnel release w/ bunionectomy and hammer toe    ? EAR SURGERY Bilateral 2012    Ear Tubes   ? SINUS SURGERY  10/2011   ? CARDIOVASCULAR STRESS TEST  09/2013   ? KNEE REPLACEMENT Right 05/2014   ? 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    Performed by Simonne Maffucci, MD at [facility] OR   ? TRANSVENOUS REMOVAL IMPLANTABLE DEFIBRILLATOR RIGHT VENTRICULAR LEAD Left 03/13/2018    Performed by Kathreen Cornfield, MD at [facility] CVOR   ? INSERTION RIGHT VENTRICULAR LEAD Left 03/13/2018    Performed by Kathreen Cornfield, MD at [facility] CVOR   ? FLUOROSCOPY - CARDIAC  03/13/2018    Performed by Kathreen Cornfield, MD at [facility] CVOR   ? INSERTION/ REPLACEMENT TEMPORARY PACEMAKER LEAD/ CATHETER  03/13/2018  Performed by Kathreen Cornfield, MD at [facility] CVOR   ? REMOVAL AND REPLACEMENT IMPLANTABLE DEFIBRILLATOR GENERATOR - DUAL LEAD SYSTEM  03/13/2018    Performed by Kathreen Cornfield, MD at [facility] CVOR   ? CARDIOTHORACIC SURGERY STANDBY N/A 03/13/2018    Performed by Cath, Physician at [facility] CVOR   ? INTRACARDIAC CATHETER ABLATION WITH COMPREHENSIVE ELECTROPHYSIOLOGIC EVALUATION - ATRIAL FIBRILLATION, RADIOFREQUENCY N/A 11/22/2018    Performed by Kathreen Cornfield, MD at [facility] EP LAB   ? INTRACARDIAC ELECTROPHYSIOLOGIC 3-DIMENSIONAL MAPPING N/A 11/22/2018    Performed by Kathreen Cornfield, MD at [facility] EP LAB   ? INTRACARDIAC ECHOCARDIOGRAPHY N/A 11/22/2018    Performed by Kathreen Cornfield, MD at [facility] EP LAB   ? POSSIBLE INTRAVENOUS DRUG INFUSION FOR STIMULATION AND PACING N/A 11/22/2018    Performed by Kathreen Cornfield, MD at [facility] EP LAB   ? POSSIBLE INSERTION ESOPHAGEAL DEVIATOR N/A 11/22/2018    Performed by Kathreen Cornfield, MD at [facility] EP LAB   ? POSSIBLE EXTERNAL CARDIOVERSION N/A 11/22/2018    Performed by Kathreen Cornfield, MD at [facility] EP LAB   ? INTRACARDIAC CATHETER ABLATION WITH COMPREHENSIVE ELECTROPHYSIOLOGIC EVALUATION - ATYPICAL FLUTTER Right 11/22/2018    Performed by Kathreen Cornfield, MD at [facility] EP LAB   ? TRANSESOPHAGEAL ECHOCARDIOGRAM DURING INTERVENTION N/A 11/22/2018    Performed by Cath, Physician at [facility] EP LAB   ? INSERTION SPINAL NEUROSTIMULATOR PULSE GENERATOR/ RECEIVER N/A 11/17/2021    Performed by Evelina Bucy, MD at [facility] OR   ? PERCUTANEOUS IMPLANTATION EPIDURAL NEUROSTIMULATOR ELECTRODE ARRAY x2 N/A 11/17/2021    Performed by Evelina Bucy, MD at [facility] OR   ? ELECTRONIC ANALYSIS IMPLANTED NEUROSTIMULATOR PULSE GENERATOR SYSTEM - COMPLEX SPINAL CORD/ PERIPHERAL NEUROSTIMULATOR PULSE GENERATOR/ TRANSMITTER WITH INTRA OPERATIVE/ SUBSEQUENT PROGRAMING N/A 11/17/2021    Performed by Evelina Bucy, MD at [facility] OR   ? CARDIAC DEFIBRILLATOR PLACEMENT  11/2013, 02-2018    S/P Fidelis ICD Lead Extraction and New Lead Implantation   ? HX BACK SURGERY  2013, 2006    Lumbart Laminectomy and Fusion   ? HX CARPAL TUNNEL RELEASE Right 1990s   ? HX CORONARY ARTERY BYPASS GRAFT  2006   ? HX HEART CATHETERIZATION     ? KNEE SURGERY  2015, 2016, 2020   ? UMBILICAL ARTERIAL CATH - BEDSIDE  2006       family history includes Cancer in his father; Hypertension in his mother; Joint Pain in his mother.    Social History     Socioeconomic History   ? Marital status: Married   Tobacco Use   ? Smoking status: Never   ? Smokeless tobacco: Never   Vaping Use   ? Vaping Use: Never used   Substance and Sexual Activity   ? Alcohol use: No     Comment: very rarely 4 drinks per year   ? Drug use: No   ? Sexual activity: Yes     Partners: Female     Birth control/protection: None       Allergies   Allergen Reactions   ? Clarithromycin RASH     Allergy recorded in SMS: Biaxin~Reactions: RASH~THRUSH   ? Ketoconazole SEE COMMENTS     Had bloodshot eyes after use, and sore.   ? Lisinopril COUGH   ? Oxycodone UNKNOWN     Nightmare       There were no vitals filed for this visit.    REVIEW OF SYSTEMS: 10  point ROS obtained and negative except pain    PHYSICAL EXAM:  General: Alert, cooperative, no distress  Head: Normocephalic, atraumatic  Lungs: Unlabored respirations  Heart: Well perfused  Abdomen: Non-distended  Musculoskeletal: Moves all extremities  Neurological: Grossly intact      IMPRESSION:    1. Arthritis of right knee    2. Chronic pain of right knee    3. Neuropathic pain         PLAN: Radiofrequency Ablation- Right Genicular RFA        Electronic consent signed. Case discussed with attending physician.     Dola Argyle, MD  Interventional Pain Medicine Fellow, PGY-5    General Pre Procedural Sedation ASA Classification      I have discussed risks and alternatives of this type of sedation and procedure with: patient and significant other    NPO Status:Acceptable    Pregnancy Status: No    Prior Anesthetic Types: Anxiolysis    Patient's had previous experience with anesthesia and/or sedation complications: No    Family history of sedation complications: No    Airway: Airway assessment performed Mallampati  II (soft palate, uvula, fauces visible)    Head and Neck: No abnormalities noted    Mouth: No abnormalities noted    Medications for Procedural Sedation: Midazolam and Fentanyl    Anesthesia Classification:  ASA II (A normal patient with mild systemic disease)    Patient remains a candidate for procedure: Yes    The intention for the procedure today is Anxiolysis/Analgesia.

## 2022-06-13 ENCOUNTER — Encounter: Admit: 2022-06-13 | Discharge: 2022-06-13 | Payer: MEDICARE

## 2022-06-13 ENCOUNTER — Ambulatory Visit: Admit: 2022-06-13 | Discharge: 2022-06-13 | Payer: MEDICARE

## 2022-06-13 DIAGNOSIS — M1711 Unilateral primary osteoarthritis, right knee: Secondary | ICD-10-CM

## 2022-06-13 DIAGNOSIS — M792 Neuralgia and neuritis, unspecified: Secondary | ICD-10-CM

## 2022-06-13 DIAGNOSIS — M25561 Pain in right knee: Secondary | ICD-10-CM

## 2022-06-13 MED ORDER — LIDOCAINE (PF) 20 MG/ML (2 %) IJ SOLN
5 mL | Freq: Once | INTRAMUSCULAR | 0 refills | Status: CP
Start: 2022-06-13 — End: ?
  Administered 2022-06-13: 19:00:00 100 mg via INTRAMUSCULAR

## 2022-06-13 MED ORDER — FENTANYL CITRATE (PF) 50 MCG/ML IJ SOLN
50-100 ug | INTRAVENOUS | 0 refills | Status: DC | PRN
Start: 2022-06-13 — End: 2022-06-13

## 2022-06-13 MED ORDER — DEXAMETHASONE SODIUM PHOS (PF) 10 MG/ML IJ EPIDURAL SOLN
10 mg | Freq: Once | EPIDURAL | 0 refills | Status: CP
Start: 2022-06-13 — End: ?
  Administered 2022-06-13: 19:00:00 10 mg via EPIDURAL

## 2022-06-13 MED ORDER — MIDAZOLAM 1 MG/ML IJ SOLN
1 mg | INTRAVENOUS | 0 refills | Status: DC | PRN
Start: 2022-06-13 — End: 2022-06-13
  Administered 2022-06-13: 19:00:00 1 mg via INTRAVENOUS

## 2022-06-13 NOTE — Progress Notes

## 2022-06-13 NOTE — Patient Instructions
Discharge Instructions for Radiofrequency Ablation    Important information following your procedure today: You may NOT drive today    Go directly home and rest. You may resume your regular activities and exercise tomorrow.   You may experience soreness at the injection site. Apply ice at 20 minute intervals for             the next 24 hours. Avoid application of direct heat, hot showers or hot tubs today.  It is not uncommon to experience an increase in pain for several days and up to a week after the procedure.  Though the procedure is generally safe and complications are rare, we do ask that you be aware of any of the following:   Any swelling, persistent redness, new bleeding, or drainage from the site of the injection.  You should not experience a severe headache.  You should not run a fever over 101 F.  New onset of sharp, severe back & or neck pain.  New onset of upper or lower extremity numbness or weakness.  New difficulty controlling bowel or bladder function after the injection.  New shortness of breath.    If any of these occur, please call to report this occurrence to Dr. Latif at [phone]. If you are calling after 4:00 p.m. or on weekends and holidays please call [phone] and ask to have the resident physician on call for the physician paged or go to your local emergency room.  The beneficial effect from the radiofrequency procedure may take several weeks to be demonstrated.  Take medications as directed.   Please call the nurse at the number listed above with any questions.        The following medications were used: Lidocaine , Decadron, Versed, Fentanyl, and Normal Saline    If you are unable to keep your upcoming appointment, please notify the [facility] scheduler at [phone] at least 24 hours in advance.

## 2022-06-13 NOTE — Progress Notes
Sedation physician present in room.  Recent vitals and patient condition reviewed between sedating physician and nurse.  Reassessment completed.  Determination made to proceed with planned sedation.

## 2022-06-13 NOTE — Procedures
Attending Surgeon: Evelina Bucy, MD    Anesthesia: Local    Pre-Procedure Diagnosis:   1. Arthritis of right knee    2. Chronic pain of right knee    3. Neuropathic pain        Post-Procedure Diagnosis:   1. Arthritis of right knee    2. Chronic pain of right knee    3. Neuropathic pain        Pain Score: Four    Dest Other Periph    Laterality: right    Location: Knee       Consent:   Consent obtained: written  Consent given by: patient  Risks discussed: allergic reaction, bleeding, bruising, infection, nerve damage, no change or worsening in pain, reaction to medication, seizure, sensation loss, skin burn, swelling and weakness  Alternatives discussed: alternative treatment, delayed treatment, no treatment and referral  Discussed with patient the purpose of the treatment/procedure, other ways of treating my condition, including no treatment/ procedure and the risks and benefits of the alternatives. Patient has decided to proceed with treatment/procedure.        Universal Protocol:  Relevant documents: relevant documents present and verified  Test results: test results available and properly labeled  Imaging studies: imaging studies available  Required items: required blood products, implants, devices, and special equipment available  Site marked: the operative site was marked  Patient identity confirmed: Patient identify confirmed verbally with patient.        Time out: Immediately prior to procedure a time out was called to verify the correct patient, procedure, equipment, support staff and site/side marked as required      Patient has had 2 Medial Branch Blocks with greater than 80% relief that lasted more than 1 hour which is consistent with the local agent used.     Procedures Details:   Indications: pain     Prep: chlorhexidine  Local anesthetic:  1% lidocaine - 0.44mL  Sedation: anxiolysisPatient position: supine  Estimated Blood Loss: minimal  Specimens: none      Guidance: fluoroscopy  Needle size: 18 G  Active Needle Tip Length: 10mm  Neurolytic Technique: Radiofrequency Ablation      Patient tolerance: Patient tolerated the procedure well with no immediate complications. Pressure was applied, and hemostasis was accomplished.  Comments:   PROCEDURE: right knee superolateral genicular, superomedial genicular, and inferomedial genicular nerve radiofrequency ablation with fluoroscopic needle guidance.    SURGEON: Evelina Bucy, MD.    MEDICATIONS: Lidocaine 2 percent with dexamethasone 2mg  as outlined below.    ANESTHESIA: Local anesthetic, 1 mL of 1 percent lidocaine at each site.     SEDATION: Versed and Fentanyl as per nurse charting.    ESTIMATED BLOOD LOSS: None.    SPECIMENS REMOVED: None    COMPLICATIONS: None.    DESCRIPTION OF PROCEDURE: A time-out was taken to identify the correct patient, procedure, and site prior to starting the procedure. With the patient lying in the supine position, the area was prepped and draped in the usual sterile fashion using DuraPrep and a fenestrated drape. The right lower extremity was placed on pillows so that the lateral views could later be obtained without issue. The local anesthetic was given using a 27-gauge 1.5 inch needle by raising a skin wheal and going down to the hub at each of the 3 locations, which correlated with the superior lateral genicular nerve, superior medial genicular nerve, and the inferior medial geniculate nerve at the superior medial and superior lateral epicondyle of the  femur as well as at the distal aspect of the medial tibial epicondyle.     An 18-gauge, 10mm active tip RF needle was then inserted at each of these locations and advanced under AP view until they were placed at the landmarks noted above. A lateral view was then obtained and position of the needles was confirmed to be at the appropriate position under fluoroscopy.      After negative aspiration, 1 mL of the solution above was injected at each site. A 90-second ablation at 80 degrees Celsius was then performed at all 3 sites.     The procedure was completed without complication and was tolerated well. The patient was monitored after the procedure. The patient was given postprocedure discharge instructions to follow at home. The patient was discharged in stable condition.     Evelina Bucy, MD, MBA         Radiofrequency time 90  Radiofrequency Temperature 80          Administrations This Visit     dexamethasone PF (DECADRON) epidural injection 10 mg     Admin Date  06/13/2022 Action  Given Dose  10 mg Route  Epidural Administered By  Evelina Bucy, MD          lidocaine (PF) 20 mg/mL (2 %) injection 100 mg     Admin Date  06/13/2022 Action  Given Dose  100 mg Route  Injection Administered By  Evelina Bucy, MD          midazolam (VERSED) injection 1 mg     Admin Date  06/13/2022 Action  Given Dose  1 mg Route  Intravenous Administered By  Gladstone Lighter, RN              Estimated blood loss: none or minimal  Specimens: none  Patient tolerated the procedure well with no immediate complications. Pressure was applied, and hemostasis was accomplished.

## 2022-06-14 ENCOUNTER — Encounter: Admit: 2022-06-14 | Discharge: 2022-06-14 | Payer: MEDICARE

## 2022-06-14 MED ORDER — CARBIDOPA-LEVODOPA 25-100 MG PO TAB
ORAL_TABLET | 3 refills | Status: AC
Start: 2022-06-14 — End: ?

## 2022-06-14 NOTE — Telephone Encounter
Received refill request for carbidopa-levodopa.  Medication is listed to continue under plan of care from last visit on 04/13/2022.  Patient has been seen in the last year and has a future appointment scheduled.  Refills authorized with neurologist to co-sign.

## 2022-06-16 ENCOUNTER — Encounter: Admit: 2022-06-16 | Discharge: 2022-06-16 | Payer: MEDICARE

## 2022-06-16 DIAGNOSIS — Z95810 Presence of automatic (implantable) cardiac defibrillator: Secondary | ICD-10-CM

## 2022-06-16 NOTE — Telephone Encounter
-----   Message from Lillie Fragmin, RN sent at 06/16/2022 12:08 PM CDT -----  Regarding: VT monitor on remote  Pt had 28 sec of VT on Jun 29, rate 138-158 bpm. Self terminated  VT therapies start @ 171.    Sees Porter and RCP  Do wee need to adjust therapy rate?

## 2022-06-16 NOTE — Telephone Encounter
Spoke to patient's wife. They are going on vacation in a week and will be gone 3 weeks. They are agreeable to device check on 11/16 at 3:30pm.

## 2022-06-16 NOTE — Telephone Encounter
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 Called to notify and see if he recalled anything significant that he did or symptoms that day but event was 3 months ago. He is prescribed metoprolol XL 100mg  daily. Will route to RCP.

## 2022-07-19 ENCOUNTER — Encounter: Admit: 2022-07-19 | Discharge: 2022-07-19 | Payer: MEDICARE

## 2022-07-26 ENCOUNTER — Encounter: Admit: 2022-07-26 | Discharge: 2022-07-26 | Payer: MEDICARE

## 2022-07-26 ENCOUNTER — Ambulatory Visit: Admit: 2022-07-26 | Discharge: 2022-07-27 | Payer: MEDICARE

## 2022-07-26 DIAGNOSIS — G4733 Obstructive sleep apnea (adult) (pediatric): Secondary | ICD-10-CM

## 2022-07-26 DIAGNOSIS — M4800 Spinal stenosis, site unspecified: Secondary | ICD-10-CM

## 2022-07-26 DIAGNOSIS — J449 Chronic obstructive pulmonary disease, unspecified: Secondary | ICD-10-CM

## 2022-07-26 DIAGNOSIS — I509 Heart failure, unspecified: Secondary | ICD-10-CM

## 2022-07-26 DIAGNOSIS — M549 Dorsalgia, unspecified: Secondary | ICD-10-CM

## 2022-07-26 DIAGNOSIS — H269 Unspecified cataract: Secondary | ICD-10-CM

## 2022-07-26 DIAGNOSIS — I255 Ischemic cardiomyopathy: Secondary | ICD-10-CM

## 2022-07-26 DIAGNOSIS — R053 Chronic cough: Secondary | ICD-10-CM

## 2022-07-26 DIAGNOSIS — N529 Male erectile dysfunction, unspecified: Secondary | ICD-10-CM

## 2022-07-26 DIAGNOSIS — K449 Diaphragmatic hernia without obstruction or gangrene: Secondary | ICD-10-CM

## 2022-07-26 DIAGNOSIS — Z95810 Presence of automatic (implantable) cardiac defibrillator: Secondary | ICD-10-CM

## 2022-07-26 DIAGNOSIS — R7303 Prediabetes: Secondary | ICD-10-CM

## 2022-07-26 DIAGNOSIS — I4891 Unspecified atrial fibrillation: Secondary | ICD-10-CM

## 2022-07-26 DIAGNOSIS — T148XXA Other injury of unspecified body region, initial encounter: Secondary | ICD-10-CM

## 2022-07-26 DIAGNOSIS — I10 Essential (primary) hypertension: Secondary | ICD-10-CM

## 2022-07-26 DIAGNOSIS — I219 Acute myocardial infarction, unspecified: Secondary | ICD-10-CM

## 2022-07-26 DIAGNOSIS — I340 Nonrheumatic mitral (valve) insufficiency: Secondary | ICD-10-CM

## 2022-07-26 DIAGNOSIS — M5136 Other intervertebral disc degeneration, lumbar region: Secondary | ICD-10-CM

## 2022-07-26 DIAGNOSIS — E785 Hyperlipidemia, unspecified: Secondary | ICD-10-CM

## 2022-07-26 DIAGNOSIS — M1990 Unspecified osteoarthritis, unspecified site: Secondary | ICD-10-CM

## 2022-07-26 DIAGNOSIS — K5900 Constipation, unspecified: Secondary | ICD-10-CM

## 2022-07-26 DIAGNOSIS — G5600 Carpal tunnel syndrome, unspecified upper limb: Secondary | ICD-10-CM

## 2022-07-26 DIAGNOSIS — M2550 Pain in unspecified joint: Secondary | ICD-10-CM

## 2022-07-26 DIAGNOSIS — M25561 Pain in right knee: Secondary | ICD-10-CM

## 2022-07-26 DIAGNOSIS — M792 Neuralgia and neuritis, unspecified: Secondary | ICD-10-CM

## 2022-07-26 DIAGNOSIS — IMO0002 Ulcer: Secondary | ICD-10-CM

## 2022-07-26 DIAGNOSIS — I2510 Atherosclerotic heart disease of native coronary artery without angina pectoris: Secondary | ICD-10-CM

## 2022-07-26 DIAGNOSIS — E669 Obesity, unspecified: Secondary | ICD-10-CM

## 2022-07-26 DIAGNOSIS — K219 Gastro-esophageal reflux disease without esophagitis: Secondary | ICD-10-CM

## 2022-07-26 DIAGNOSIS — J302 Other seasonal allergic rhinitis: Secondary | ICD-10-CM

## 2022-07-26 NOTE — Progress Notes
[facility] Interventional [facility]  Subjective     Chief Complaint:   Chief Complaint   Patient presents with   ? Lower Back - Pain   ? Right Knee - Pain   ? Pain       HPI: Johnny [name] is a 76 y.o. 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 who presents for evaluation.     Patient presents to clinic for follow-up of LBP/knee pain.    RIght knee pain  S/P right genicular RFA, minimal relief.       S/P SCS placement on 11/17/2021  Reports 50% relief with SCS  Radiating pain in LLE has improved by 90%  Lower back pain is better in the am, but worsens by evening  Right knee pain continues to have pain      Patient endorses achiness/heaviness of legs with walking a long distance that improves with rest. Positive Shopping Cart sign.     Johnny [name] denies any recent fevers, chills, infection, antibiotics, bowel or bladder incontinence, saddle anesthesia. +Xarelto for a. fib.  Recent diagnosis of parkinsons    Taking tizanidine, reports mod. pain relief.. Has decreased due to dizziness.        PRIOR MEDICATIONS: Effective  Acetaminophen (little)  Tizanidine    Ineffective  Gabapentin    Unable to tolerate  NSAID    Never  Lyrica  Ami/Nortriptyline  Cymbalta      PRIOR INTERVENTIONS:  L-spine surgery with hardware L2-3 L3-4 and then later L4-5 and L5-1 next (2 surgeries, most recent in 2008) Right TKA with revision  Effective  Bilateral SIJ (OSH, good benefit)  Left L5-S2 TFESI  right knee RFA - genicular  Lumbar RFA      Ineffective  ESI x3 (OSH)      Past Medical History:  Medical History:   Diagnosis Date   ? Atrial fibrillation (HCC) 02/08/2009   ? Back pain    ? CAD (coronary artery disease) 02/08/2009   ? Carpal tunnel syndrome    ? Cataract 2017    both removed   ? Chronic cough    ? Congestive heart disease (HCC)    ? Constipation 2015   ? COPD (chronic obstructive pulmonary disease) (HCC)    ? Degenerative disc disease, lumbar 2010   ? Erectile dysfunction, vasculogenic    ? Fracture    ? GERD (gastroesophageal reflux disease)     controlled with elevated HOB, protonix and pepcid    ? Heart attack [facility]) possible in 2006   ? Hiatal hernia    ? Hyperlipemia 02/11/2009   ? Hypertension 02/11/2009   ? 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 Joint pain 1966   ? Mitral regurgitation    ? Myocardial infarction [facility]) 2006   ? Obesity, Class I, BMI 30.0-34.9 (see  actual BMI)    ? OSA on CPAP 02/11/2009   ? Osteoarthritis    ? Pre-diabetes    ? S/P ICD (internal cardiac defibrillator) procedure 02/11/2009   ? Seasonal allergies    ? Spinal stenosis 2010   ? Ulcer        Family History:  Family History   Problem Relation Age of Onset   ? Cancer Father    ? Heart problem Father    ? Hypertension Mother    ? Joint Pain Mother    ? Cancer Mother    ? Cancer Sister        Social History:  Lives in [place] [id number] (1.25 hours away)    Social History     Socioeconomic History   ? Marital status: Married   Tobacco Use   ? Smoking status: Never   ? Smokeless tobacco: Never   Vaping Use   ? Vaping Use: Never used   Substance and Sexual Activity   ? Alcohol use: No     Comment: very rarely 4 drinks per year   ? Drug use: No   ? Sexual activity: Yes     Partners: Female     Birth control/protection: None       Allergies:  Allergies   Allergen Reactions   ? Clarithromycin RASH     Allergy recorded in SMS: Biaxin~Reactions: RASH~THRUSH   ? Ketoconazole SEE COMMENTS     Had bloodshot eyes after use, and sore.   ? Lisinopril COUGH   ? Oxycodone UNKNOWN     Nightmare       Medications:    Current Outpatient Medications:   ?  acetaminophen (TYLENOL) 325 mg tablet, Take two tablets by mouth every 4 hours as needed for Pain., Disp: 300 tablet, Rfl: 1  ?  albuterol (VENTOLIN HFA, PROAIR HFA) 90 mcg/actuation inhaler, Inhale two puffs by mouth into the lungs four times daily as needed for Wheezing., Disp: , Rfl:   ?  aspirin 81 mg chewable tablet, Take 1 Tab by mouth daily. WAIT to restart until finished taking lovenox injections. (Patient taking differently: Chew one tablet by mouth at bedtime daily.), Disp: 90 Tab, Rfl: 3  ?  carbidopa/levodopa (SINEMET) 25/100 mg tablet, TAKE 1 TABLET BY MOUTH THREE TIMES DAILY, Disp: 270 tablet, Rfl: 3  ?  cetirizine (ZYRTEC) 10 mg tablet, Take one tablet by mouth daily., Disp: , Rfl:   ?  cholecalciferol (Vitamin D3) (VITAMIN D-3) 1,000 units tablet, Take one tablet by mouth twice daily.  , Disp: , Rfl:   ?  dextromethorphan/guaiFENesin (MUCINEX DM) 30/600 mg Tb12, Take two tablets by mouth daily as needed., Disp: , Rfl:   ?  docusate (COLACE) 100 mg capsule, Take two capsules by mouth at bedtime daily. Pt is taking 550 mg daily, Disp: , Rfl:   ?  ezetimibe (ZETIA) 10 mg tablet, TAKE 1 TABLET BY MOUTH AT BEDTIME, Disp: 90 tablet, Rfl: 2  ?  famotidine (PEPCID) 40 mg tablet, Take one tablet by mouth at bedtime daily., Disp: , Rfl:   ?  fexofenadine(+) (ALLEGRA) 180 mg tablet, Take one tablet by mouth daily., Disp: , Rfl:   ? fluticasone-umeclidin-vilanter (TRELEGY ELLIPTA) 100-62.5-25 mcg inhaler, Inhale 1 Dose by mouth into the lungs at bedtime daily., Disp: , Rfl:   ?  furosemide (LASIX) 40 mg tablet, TAKE 2 TABLETS BY MOUTH DAILY TAKE 3 TABLETS ON MONDAY/WEDNESDAY/ FRIDAY AS DIRECTED BY CARDIOLOGY, Disp: 270 tablet, Rfl: 2  ?  gabapentin (NEURONTIN) 300 mg capsule, Take one capsule by mouth three times daily., Disp: 270 capsule, Rfl: 3  ?  glucosamine su 2KCl-chondroit 500-400 mg tab, Take 1 Tab by mouth twice daily., Disp: , Rfl:   ?  ipratropium bromide (ATROVENT) 42 mcg (0.06 %) nasal spray, Apply two sprays to each nostril as directed twice daily as needed., Disp: , Rfl:   ?  losartan (COZAAR) 50 mg tablet, TAKE 1 TABLET BY MOUTH DAILY, Disp: 90 tablet, Rfl: 2  ?  metoprolol succinate XL (TOPROL XL) 100 mg extended release tablet, TAKE 1 TABLET BY MOUTH EVERY DAY, Disp: 90 tablet, Rfl: 3  ?  mucus clearing device (AEROBIKA OSCILLATING PEP SYSTM MISC), Use  as directed. Use with salt water and inhale twice daily, Disp: , Rfl:   ?  MYRBETRIQ 50 mg tablet, Take one tablet by mouth daily. Does not take on days he takes Ditropan, Disp: , Rfl:   ?  OMEGA-3 FATTY ACIDS-FISH OIL PO, Take 1 capsule by mouth twice daily., Disp: , Rfl:   ?  other medication, mupirocin ointment and budesonide solution mixed with warm water Use as nasal irrigation twice daily, Disp: , Rfl:   ?  pantoprazole DR (PROTONIX) 40 mg tablet, Take one tablet by mouth twice daily., Disp: , Rfl:   ?  polyethylene glycol 3350 (GLYCOLAX; MIRALAX) 17 gram/dose powder, Take 17 g by mouth daily. (Patient taking differently: Take seventeen g by mouth nightly as needed.), Disp: 595 g, Rfl: 3  ?  potassium chloride SR (K-DUR) 20 mEq tablet, TAKE 2 TABLETS BY MOUTH ON MONDAY, WEDNESDAY AND FRIDAY AND TAKE 1 TABLET ALL OTHER DAYS, Disp: 140 tablet, Rfl: 2  ?  predniSONE (DELTASONE) 10 mg tablet, Take one tablet by mouth daily., Disp: , Rfl:   ?  pyRIDostigmine bromide (MESTINON) 60 mg tablet, Take one tablet by mouth three times daily. 1.5 tablets 3 times daily, Disp: , Rfl:   ?  rivaroxaban (XARELTO PO), Take  by mouth., Disp: , Rfl:   ?  roflumilast (DALIRESP) 500 mcg tablet, , Disp: , Rfl:   ?  roflumilast (DALIRESP) 500 mcg tablet, Take 1/2 tab each for 7 days, if tolerated then take 1 tab each day., Disp: , Rfl:   ?  rosuvastatin (CRESTOR) 40 mg tablet, TAKE 1 TABLET BY MOUTH AT BEDTIME, Disp: 90 tablet, Rfl: 3  ?  spironolactone (ALDACTONE) 25 mg tablet, TAKE 1 TABLET BY MOUTH EVERY DAY TAKE WITH FOOD, Disp: 90 tablet, Rfl: 3  ?  testosterone cypionate 200 mg/mL kit, Inject 1 mL into the muscle every 14 days., Disp: , Rfl:   ?  tiZANidine (ZANAFLEX) 2 mg tablet, TAKE 1 TO 3 TABLETS BY MOUTH AT BEDTIME AS NEEDED, Disp: 90 tablet, Rfl: 1  ?  traMADoL (ULTRAM) 50 mg tablet, Take one tablet by mouth every 8 hours as needed for Pain., Disp: 30 tablet, Rfl: 0  ?  traZODone (DESYREL) 100 mg tablet, bedtime, Disp: , Rfl:   ?  Vacuum Erection Device System kit, Use as directed for sexual activity., Disp: 1 kit, Rfl: 11    Physical examination:   BP 111/67  - Pulse 88  - Ht 180.3 cm (5' 11)  - Wt 105.9 kg (233 lb 8 oz)  - SpO2 99%  - BMI 32.57 kg/m?   Pain Score: Six    Physical Exam:  General: The patient is a well-developed, well nourished 76 y.o. male in no acute distress.   HEENT:  Head is normocephalic and atraumatic. Pupils are equal and reactive to light bilaterally.   Cardiac: Based on palpation, pulse appears to be regular rate and rhythm.   Pulmonary: The patient has unlabored respirations and bilateral symmetric chest excursion.   Abdomen: Soft, nontender, and nondistended.   Extremities: No clubbing, cyanosis, or edema.     Neurologic:   The patient is alert and oriented times 3.       Musculoskeletal:   Gait is antalgic - cane      Lumbar spine:  Appearance: No lesions or deformity  Lumbar tenderness: moderate tenderness, increased paraspinal muscle tone  SI joint tenderness: None  Pain with extension: Yes  Pain with lateral flexion: None  FABER positive?: None  Lower extremity strength: 5/5 bilaterally  Sensation to light touch: Intact and equal in the bilateral lower extremities  Straight leg raise positive?: Bilateral      MS:   Root Right Left   Hip Flexion L2 5 5   Knee Flexion L5/S1 5 5   Knee Extension L3 5 5   Dorsiflexion L4 5 5   Plantarflexion S1 5 5   EHL Extension L5 5 5                  Results for orders placed during the hospital encounter of 08/24/20    MRI L-SPINE WO/W CONTRAST    Addendum 08/25/2020  5:39 PM  Finalized by Ivory Broad, M.D. on 08/24/2020 5:00 PM. Dictated by Delfin Gant, M.D. on 08/24/2020 3:54 PM.Addendum:  Delfin Gant discussed these findings via telephone with Dr. Francee Nodal nurse, Vassie Moment, at 8:34 AM on 08/25/2020.      Approved by Delfin Gant, M.D. on 08/25/2020 8:35 AM    By my electronic signature, I attest that I have personally reviewed the images for this examination and formulated the interpretations and opinions expressed in this report      Finalized by Ivory Broad, M.D. on 08/25/2020 5:36 PM. Dictated by Delfin Gant, M.D. on 08/25/2020 8:33 AM.    Narrative  EXAM: MRI L-SPINE    HISTORY:    , lumbar radiculapathy,    Technique: Multiple sagittal and axial MR sequences were obtained of the lumbar spine with and without MultiHance contrast.    Comparison: CT lumbar spine August 27, 2014    FINDINGS:    There are 5 nonrib-bearing lumbar type vertebral bodies. There is left convexity lumbar spinal curvature. Trace retrolisthesis at L2-L3 and trace anterolisthesis of L5-S1. X-Stop devices are seen at the spinous processes of the lumbar spine. Vertebral body heights are maintained. Multilevel vertebral endplate marrow changes. No suspicious bone marrow replacing lesion. The conus is normal in appearance and position at the T12-L1 level. abnormal enhancing lesion is identified. Bilateral renal atrophy. Small upper pole right renal cyst. There is an additional 1.3 cm upper pole right renal lesion which demonstrates T2 and T1 hypointensity (series 5, image 8).    T12-L1: No significant central spinal or neuroforaminal stenosis.    L1-L2: Marked disc degeneration, mild circumferential disc bulge, and facet hypertrophy. Mild central spinal stenosis. Moderate left and marked right neural foraminal stenosis.    L2-L3: Trace retrolisthesis, moderate disc degeneration, circumferential disc bulge, facet hypertrophy, and mild malignant flavum thickening. Mild central spinal stenosis. Moderate left and marked right neuroforaminal stenosis.    L3-L4: Moderate disc degeneration, circumferential disc bulge, and facet hypertrophy. Moderate to marked spinal stenosis. Marked bilateral neuroforaminal stenosis.    L4-L5: Mild disc degeneration, circumferential  disc bulge with superimposed central disc protrusion, ligamentum flavum thickening, and facet hypertrophy. Moderate to marked central spinal stenosis. Marked bilateral neural foraminal stenosis.    L5-S1: Mild disc degeneration, circumferential disc bulge, and facet hypertrophy. Moderate central spinal stenosis. Marked bilateral neural foraminal stenosis.    Impression  1.  Multilevel degenerative central spinal stenosis, greatest of moderate to marked degree at L3-L4, L4-L5, and L5-S1.  2.  Multilevel degenerative neuroforaminal stenosis, greatest of marked degree on the right at L1-L2 and L2-L3 as well as bilaterally at L3-L4, L4-5, and L5-S1.  3.  Left convexity lumbar spinal curvature.  4.  Trace retrolisthesis of L2-L3 and trace anterolisthesis of L5-S1.  5.  T2 and T1 hypointense peripheral right renal lesion. MRI abdomen (renal mass protocol) is recommended for further evaluation.    By my electronic signature, I attest that I have personally reviewed the images for this examination and formulated the interpretations and opinions expressed in this report          Last Cr and LFT's:  Creatinine   Date Value Ref Range Status   03/31/2021 1.30 (H) 0.4 - 1.24 MG/DL Final     AST (SGOT)   Date Value Ref Range Status   11/22/2018 25 7 - 40 U/L Final     ALT (SGPT)   Date Value Ref Range Status   11/22/2018 24 7 - 56 U/L Final     Alk Phosphatase   Date Value Ref Range Status   11/22/2018 51 25 - 110 U/L Final     Total Bilirubin   Date Value Ref Range Status   11/22/2018 0.8 0.3 - 1.2 MG/DL Final          Assessment:    Johnny [name] is a 76 y.o. 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 who presents for evaluation of pain.    The pain complaints are most likely due to:    1. Spondylosis of lumbosacral region without myelopathy or radiculopathy  DESTRUCTION OF NERVE W/ FLUORO LUMBAR/SACRAL      2. Neuropathic pain        3. Chronic pain of right knee            Patient has had an adequate trial of > 12 months of rest, exercise, multimodal treatment, and the passage of time without improvement of symptoms. The pain has significant impact on the daily quality of life.     Plan:    1.Discussed care options with patient. Plan on bilateral L4-S1 RFA at next available appointment.  Radiofrequency Ablation Checklist:    If repeat radiofrequency ablation:  50% improvement for at least 6 months [x]     50% consistent relief for at least 6 months [x]     Radiofrequency ablation completed on same level over 2 years ago  No    Oswestry:  Oswestry Total Score:: 30  2. Nevro present  3. Consider Sprint for right shoulder pain  4. Encouraged to continue at home PT program.  5. Follow up after RFA      Risks/benefits of all pharmacologic and interventional treatments discussed and questions answered.

## 2022-07-27 DIAGNOSIS — M47817 Spondylosis without myelopathy or radiculopathy, lumbosacral region: Secondary | ICD-10-CM

## 2022-08-04 ENCOUNTER — Encounter: Admit: 2022-08-04 | Discharge: 2022-08-04 | Payer: MEDICARE

## 2022-08-04 ENCOUNTER — Ambulatory Visit: Admit: 2022-08-04 | Discharge: 2022-08-04 | Payer: MEDICARE

## 2022-08-04 DIAGNOSIS — Z95810 Presence of automatic (implantable) cardiac defibrillator: Secondary | ICD-10-CM

## 2022-08-04 DIAGNOSIS — I480 Paroxysmal atrial fibrillation: Secondary | ICD-10-CM

## 2022-09-01 ENCOUNTER — Encounter: Admit: 2022-09-01 | Discharge: 2022-09-01 | Payer: MEDICARE

## 2022-09-06 ENCOUNTER — Encounter: Admit: 2022-09-06 | Discharge: 2022-09-06 | Payer: MEDICARE

## 2022-09-06 ENCOUNTER — Ambulatory Visit: Admit: 2022-09-06 | Discharge: 2022-09-06 | Payer: MEDICARE

## 2022-09-06 DIAGNOSIS — K449 Diaphragmatic hernia without obstruction or gangrene: Secondary | ICD-10-CM

## 2022-09-06 DIAGNOSIS — J449 Chronic obstructive pulmonary disease, unspecified: Secondary | ICD-10-CM

## 2022-09-06 DIAGNOSIS — N529 Male erectile dysfunction, unspecified: Secondary | ICD-10-CM

## 2022-09-06 DIAGNOSIS — I340 Nonrheumatic mitral (valve) insufficiency: Secondary | ICD-10-CM

## 2022-09-06 DIAGNOSIS — G7000 Myasthenia gravis without (acute) exacerbation: Secondary | ICD-10-CM

## 2022-09-06 DIAGNOSIS — Z95810 Presence of automatic (implantable) cardiac defibrillator: Secondary | ICD-10-CM

## 2022-09-06 DIAGNOSIS — E669 Obesity, unspecified: Secondary | ICD-10-CM

## 2022-09-06 DIAGNOSIS — I219 Acute myocardial infarction, unspecified: Secondary | ICD-10-CM

## 2022-09-06 DIAGNOSIS — R053 Chronic cough: Secondary | ICD-10-CM

## 2022-09-06 DIAGNOSIS — IMO0002 Ulcer: Secondary | ICD-10-CM

## 2022-09-06 DIAGNOSIS — E785 Hyperlipidemia, unspecified: Secondary | ICD-10-CM

## 2022-09-06 DIAGNOSIS — I509 Heart failure, unspecified: Secondary | ICD-10-CM

## 2022-09-06 DIAGNOSIS — G4733 Obstructive sleep apnea (adult) (pediatric): Secondary | ICD-10-CM

## 2022-09-06 DIAGNOSIS — M4800 Spinal stenosis, site unspecified: Secondary | ICD-10-CM

## 2022-09-06 DIAGNOSIS — K5900 Constipation, unspecified: Secondary | ICD-10-CM

## 2022-09-06 DIAGNOSIS — G5600 Carpal tunnel syndrome, unspecified upper limb: Secondary | ICD-10-CM

## 2022-09-06 DIAGNOSIS — M1990 Unspecified osteoarthritis, unspecified site: Secondary | ICD-10-CM

## 2022-09-06 DIAGNOSIS — K219 Gastro-esophageal reflux disease without esophagitis: Secondary | ICD-10-CM

## 2022-09-06 DIAGNOSIS — H269 Unspecified cataract: Secondary | ICD-10-CM

## 2022-09-06 DIAGNOSIS — M5136 Other intervertebral disc degeneration, lumbar region: Secondary | ICD-10-CM

## 2022-09-06 DIAGNOSIS — R7303 Prediabetes: Secondary | ICD-10-CM

## 2022-09-06 DIAGNOSIS — M2550 Pain in unspecified joint: Secondary | ICD-10-CM

## 2022-09-06 DIAGNOSIS — I10 Essential (primary) hypertension: Secondary | ICD-10-CM

## 2022-09-06 DIAGNOSIS — I255 Ischemic cardiomyopathy: Secondary | ICD-10-CM

## 2022-09-06 DIAGNOSIS — I2510 Atherosclerotic heart disease of native coronary artery without angina pectoris: Secondary | ICD-10-CM

## 2022-09-06 DIAGNOSIS — M549 Dorsalgia, unspecified: Secondary | ICD-10-CM

## 2022-09-06 DIAGNOSIS — J302 Other seasonal allergic rhinitis: Secondary | ICD-10-CM

## 2022-09-06 DIAGNOSIS — I4891 Unspecified atrial fibrillation: Secondary | ICD-10-CM

## 2022-09-06 DIAGNOSIS — T148XXA Other injury of unspecified body region, initial encounter: Secondary | ICD-10-CM

## 2022-09-14 ENCOUNTER — Encounter: Admit: 2022-09-14 | Discharge: 2022-09-14 | Payer: MEDICARE

## 2022-09-14 NOTE — Telephone Encounter
-----   Message from Jerl Mina, RN sent at 09/14/2022  3:42 PM CST -----  Regarding: Heart Failure  Good Afternoon,  Routine HM transmission shows Optivol has been elevated and is ongoing since 12/22.  He has had some recent atrial arrhythmias with longest 2 hours.  He is on OAC.    Diane

## 2022-09-15 ENCOUNTER — Encounter: Admit: 2022-09-15 | Discharge: 2022-09-15 | Payer: MEDICARE

## 2022-09-22 ENCOUNTER — Encounter: Admit: 2022-09-22 | Discharge: 2022-09-22 | Payer: MEDICARE

## 2022-09-22 NOTE — Telephone Encounter
Pt says he sent remote today. He has been out of town and had messages about AF. Informed him that currently not in AF and last events was on Dec 28.   Thoracic impedance indicated poss fluid. Pt denies symptoms. Enc low salt diet.   Pt questions June OV with RCP. Informed him that OV with Eddie Dibbles, NP is scheduled for June.    He is appreciative of all information.

## 2022-09-23 ENCOUNTER — Encounter: Admit: 2022-09-23 | Discharge: 2022-09-23 | Payer: MEDICARE

## 2022-09-23 ENCOUNTER — Ambulatory Visit: Admit: 2022-09-23 | Discharge: 2022-09-23 | Payer: MEDICARE

## 2022-09-23 DIAGNOSIS — G20A1 Parkinson's disease without dyskinesia or fluctuating manifestations: Secondary | ICD-10-CM

## 2022-09-23 MED ORDER — CARBIDOPA-LEVODOPA 25-100 MG PO TAB
ORAL_TABLET | 3 refills | Status: AC
Start: 2022-09-23 — End: ?

## 2022-09-23 NOTE — Progress Notes
[facility]  Department of Neurology   [facility]    Date of service: 09/23/2022     Reason for visit:  Johnny [name] is 77 y.o. 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  Presents with wife who aids in providing history.     Having more GI and afib issues   One instance of swallowing trouble     Tremor is getting worse, bothersome   Tries to think about LSVT BIG moves when he is walking, moving     Wondering if needs to take levodopa with or without food  Denies nausea if takes on empty stomach   Denies robust kinetic benefit, no wearing off with levodopa - discussed he does not have to be so careful with timing of levodopa with meals, he can take with or without     Has insomnia, takes trazodone 150 mg   Denies trouble turning in bed     Meds:   carbidopa/levodopa IR 25/100mg  1 tab TID 8 / 2 / 7  Mestinon 90 mg TID  Myrbetriq     Cousin diagnosed this year with PD - has MF and dyskinesias - has had trouble getting an appt with our PD group     Medications:   Current Outpatient Medications on File Prior to Visit   Medication Sig Dispense Refill    acetaminophen (TYLENOL) 325 mg tablet Take two tablets by mouth every 4 hours as needed for Pain. 300 tablet 1    albuterol (VENTOLIN HFA, PROAIR HFA) 90 mcg/actuation inhaler Inhale two puffs by mouth into the lungs four times daily as needed for Wheezing.      aspirin 81 mg chewable tablet Take 1 Tab by mouth daily. WAIT to restart until finished taking lovenox injections. (Patient taking differently: Chew one tablet by mouth at bedtime daily.) 90 Tab 3    cetirizine (ZYRTEC) 10 mg tablet Take one tablet by mouth daily.      cholecalciferol (Vitamin D3) (VITAMIN D-3) 1,000 units tablet Take one tablet by mouth twice daily.        dextromethorphan/guaiFENesin (MUCINEX DM) 30/600 mg Tb12 Take two tablets by mouth daily as needed.      docusate (COLACE) 100 mg capsule Take two capsules by mouth at bedtime daily. Pt is taking 550 mg daily      ezetimibe (ZETIA) 10 mg tablet TAKE 1 TABLET BY MOUTH AT BEDTIME 90 tablet 2    famotidine (PEPCID) 40 mg tablet Take one tablet by mouth at bedtime daily.      fexofenadine(+) (ALLEGRA) 180 mg tablet Take one tablet by mouth daily.      fluticasone-umeclidin-vilanter (TRELEGY ELLIPTA) 100-62.5-25 mcg inhaler Inhale 1 Dose by mouth into the lungs at bedtime daily.      furosemide (LASIX) 40 mg tablet TAKE 2 TABLETS BY MOUTH DAILY TAKE 3 TABLETS ON MONDAY/WEDNESDAY/ FRIDAY AS DIRECTED BY CARDIOLOGY 270 tablet 2    glucosamine su 2KCl-chondroit 500-400 mg tab Take 1 Tab by mouth twice daily.      ipratropium bromide (ATROVENT) 42 mcg (0.06 %) nasal spray Apply two sprays to each nostril as directed twice daily as needed.      losartan (COZAAR) 50 mg tablet TAKE 1 TABLET BY MOUTH DAILY 90 tablet 2    metoprolol succinate XL (TOPROL XL) 100 mg extended release tablet TAKE 1 TABLET BY  MOUTH EVERY DAY 90 tablet 3    mucus clearing device (AEROBIKA OSCILLATING PEP SYSTM MISC) Use  as directed. Use with salt water and inhale twice daily      MYRBETRIQ 50 mg tablet Take one tablet by mouth daily. Does not take on days he takes Ditropan      OMEGA-3 FATTY ACIDS-FISH OIL PO Take 1 capsule by mouth twice daily.      other medication mupirocin ointment and budesonide solution mixed with warm water  Use as nasal irrigation twice daily      pantoprazole DR (PROTONIX) 40 mg tablet Take one tablet by mouth twice daily.      polyethylene glycol 3350 (GLYCOLAX; MIRALAX) 17 gram/dose powder Take 17 g by mouth daily. (Patient taking differently: Take seventeen g by mouth nightly as needed.) 595 g 3    potassium chloride SR (K-DUR) 20 mEq tablet TAKE 2 TABLETS BY MOUTH ON MONDAY, WEDNESDAY AND FRIDAY AND TAKE 1 TABLET ALL OTHER DAYS 140 tablet 2    predniSONE (DELTASONE) 10 mg tablet Take one tablet by mouth daily.      pyRIDostigmine bromide (MESTINON) 60 mg tablet Take one tablet by mouth three times daily. 1.5 tablets 3 times daily      rivaroxaban (XARELTO PO) Take  by mouth.      rosuvastatin (CRESTOR) 40 mg tablet TAKE 1 TABLET BY MOUTH AT BEDTIME 90 tablet 3    spironolactone (ALDACTONE) 25 mg tablet TAKE 1 TABLET BY MOUTH EVERY DAY TAKE WITH FOOD 90 tablet 3    testosterone cypionate 200 mg/mL kit Inject 1 mL into the muscle every 14 days.      tiZANidine (ZANAFLEX) 2 mg tablet TAKE 1 TO 3 TABLETS BY MOUTH AT BEDTIME AS NEEDED 90 tablet 1    traMADoL (ULTRAM) 50 mg tablet Take one tablet by mouth every 8 hours as needed for Pain. 30 tablet 0    traZODone (DESYREL) 100 mg tablet 1.5 tablets.      Vacuum Erection Device System kit Use as directed for sexual activity. 1 kit 11     No current facility-administered medications on file prior to visit.       Past Medical History:    Medical History:   Diagnosis Date    Atrial fibrillation (HCC) 02/08/2009    Back pain     CAD (coronary artery disease) 02/08/2009    Carpal tunnel syndrome     Cataract 2017    both removed    Chronic cough     Congestive heart disease (HCC)     Constipation 2015    COPD (chronic obstructive pulmonary disease) (HCC)     Degenerative disc disease, lumbar 2010    Erectile dysfunction, vasculogenic     Fracture     GERD (gastroesophageal reflux disease)     controlled with elevated HOB, protonix and pepcid     Heart attack (HCC) possible in 2006    Hiatal hernia     Hyperlipemia 02/11/2009    Hypertension 02/11/2009    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    Joint pain 1966    Mitral regurgitation     Myocardial infarction (HCC) 2006    Obesity, Class I, 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)     OSA on CPAP 02/11/2009    Osteoarthritis     Pre-diabetes     S/P ICD (internal cardiac defibrillator) procedure 02/11/2009    Seasonal allergies  Spinal stenosis 2010    Ulcer         Social History:    Social History     Socioeconomic History    Marital status: Married   Tobacco Use    Smoking status: Never    Smokeless tobacco: Never   Vaping Use    Vaping Use: Never used   Substance and Sexual Activity    Alcohol use: No     Comment: very rarely 4 drinks per year    Drug use: No    Sexual activity: Yes     Partners: Female     Birth control/protection: None       Family History:   Family History   Problem Relation Age of Onset    Cancer Father     Heart problem Father     Hypertension Mother     Joint Pain Mother     Cancer Mother     Cancer Sister         Allergies:   Allergies   Allergen Reactions    Clarithromycin RASH     Allergy recorded in SMS: Biaxin~Reactions: RASH~THRUSH    Ketoconazole SEE COMMENTS     Had bloodshot eyes after use, and sore.    Lisinopril COUGH    Oxycodone UNKNOWN     Nightmare         PHYSICAL EXAMINATION:      VITAL SIGNS:   Vitals:    09/23/22 1426   BP: 125/77   BP Source: Arm, Left Upper   Patient Position: Sitting   Pulse: 87         Body mass index is 31.8 kg/m?Marland Kitchen    Overweight    NEUROLOGICAL EXAM:  MS: Alert, awake, cooperative. Speech fluent without paraphasic errors. Able to provide accurate history, remote memory intact. Good fund of knowledge.    Movement disorders exam  Mild hypophonia  Mild facial masking  Mild intermittent L resting tremor  Mild bilateral postural and action tremor   Mild bilateral rigidity  Mild bilateral L > R bradykinesia with finger taps, supination/pronation, hand grips, or foot taps/stomps  Stands up unassisted  Posture slightly stooped  Gait is narrow based, steady with fair stride length and arm swing , antalgic of back     UPDRS Motor:     Speech: 1 - Slight loss of expression, diction and/or volume.  Facial Expression: 1 - Minimal hypomimia, could be normal poker face.  Tremor at Rest Jaw: 0 - Absent.  Tremor at Rest RUE: 0 - Absent  Tremor at Rest LUE: 1 - Slight and infrequently present.  Tremor at Rest RLE: 0 - Absent  Tremor at Rest LLE: 0 - Absent  Action of Postural Tremor of Hands R: 1 - Slight and infrequent.  Action of Postural Tremor of Hands L: 1 - Slight and infrequent.  Rigidity NECK: 1 - Slight or detectable only when activated by mirror or other movements.  Rigidity RUE: 1 - Slight or detectable only when activated by mirror or other movements.  Rigidity LUE: 2 - Mild to moderate.  Rigidity RLE: 1 - Slight or detectable only when activated by mirror or other movements  Rigidity LLE: 1 - Slight or detectable only when activated by mirror or other movements.  Finger Taps L: 2 - Moderately impaired. Definite and early fatiguing. May have occasional arrests in movement. (7-10/5 sec)  Hand Movements R: 1 - Mild slowing and/or reduction in amplitude.  Hand Movements L: 1 -  Mild slowing and/or reduction in amplitude.  Rapid Alternating Movement of Hands R: 1 - Mild slowing and/or reduction in amplitude.  Rapid Alternating Movements of Hands L: 2 - Moderately impaired. Definite and early fatiguing. May have occasional arrests in movement.  Leg Agility with Knee Bent R: 1 - Mild slowing and/or reduction in amplitude.  Leg Agility with Knee Bent L: 2 - Moderately impaired. Definite and early fatiguing.  Arising From Chair: 1 - Slow, or may need more than one attempt.  Posture: 1 - Not quite erect, slightly stooped posture- could be normal for older person.  Gait: 1 - Walks slowly, may shuffle with short steps but no festination or propulsion.  Postural Stability: 0 - Normal  Body Bradykinesia and Hypokinesia: 1 - Minimal slowness, giving movement a deliberate character- could be normal for some persons.  Total Motor Exam: 26      Labs/Imaging:      Assessment/Plan:    Johnny [name] is a 77 y.o. right handed male PMH CAD s//p 6vCABG 2006, COPD, atrial fibrillation, s/p ICD, HTN, OSA compliant with CPAP, neuropathy. who presents today for evaluation of hand tremors, symptom onset 1993 with worsening of L hand tremor around 2018. Started on levodopa in 2022 and noticed improvement in tremor, movements. Examination reveals a mild bilateral postural/action tremor, with an intermittent L resting tremor, and mild L  > R bradykinesia/ rigidity. 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 Meredeth Ide    He may be underdosed on levodopa - suggest increase from 1 tab TID to 1.5 tabs TID , or may try 2-1-1, or 2-2-1 tabs TID. May take with or without food     Continue exercise, BIG movements     Continue CPAP     Cognitive decline- likely multifactorial from presumed [facility] I/s/o vascular risk factors, PD, aging - will monitor     Encouraged exercise and healthy Mediterranean MIND diet for neuroprotection - MIND diet tip sheet shared     Will try to get cousin with PD established appt at our PD Center     Follow up in 6 -8 months    Problem List Items Addressed This Visit    None          Beatrix Shipper, MD  Assistant Professor of Neurology  [facility]  [facility]          I spent a total of 30 minutes on this patient's care on the day of their visit excluding time spent related to any billed procedures. This time includes face-to-face time with the patient as well as time spent documenting in the medical record, reviewing patient's records and tests, obtaining history, placing orders, communicating with other healthcare professionals, counseling the patient, family, or caregiver, and/or care coordination for the diagnoses above.

## 2022-09-25 ENCOUNTER — Encounter: Admit: 2022-09-25 | Discharge: 2022-09-25 | Payer: MEDICARE

## 2022-09-26 ENCOUNTER — Encounter: Admit: 2022-09-26 | Discharge: 2022-09-26 | Payer: MEDICARE

## 2022-09-26 ENCOUNTER — Ambulatory Visit: Admit: 2022-09-26 | Discharge: 2022-09-27 | Payer: MEDICARE

## 2022-09-26 ENCOUNTER — Ambulatory Visit: Admit: 2022-09-26 | Discharge: 2022-09-26 | Payer: MEDICARE

## 2022-09-26 DIAGNOSIS — M2550 Pain in unspecified joint: Secondary | ICD-10-CM

## 2022-09-26 DIAGNOSIS — K649 Unspecified hemorrhoids: Secondary | ICD-10-CM

## 2022-09-26 DIAGNOSIS — K449 Diaphragmatic hernia without obstruction or gangrene: Secondary | ICD-10-CM

## 2022-09-26 DIAGNOSIS — I340 Nonrheumatic mitral (valve) insufficiency: Secondary | ICD-10-CM

## 2022-09-26 DIAGNOSIS — I509 Heart failure, unspecified: Secondary | ICD-10-CM

## 2022-09-26 DIAGNOSIS — R053 Chronic cough: Secondary | ICD-10-CM

## 2022-09-26 DIAGNOSIS — H9190 Unspecified hearing loss, unspecified ear: Secondary | ICD-10-CM

## 2022-09-26 DIAGNOSIS — D699 Hemorrhagic condition, unspecified: Secondary | ICD-10-CM

## 2022-09-26 DIAGNOSIS — R159 Full incontinence of feces: Secondary | ICD-10-CM

## 2022-09-26 DIAGNOSIS — M1990 Unspecified osteoarthritis, unspecified site: Secondary | ICD-10-CM

## 2022-09-26 DIAGNOSIS — I2510 Atherosclerotic heart disease of native coronary artery without angina pectoris: Secondary | ICD-10-CM

## 2022-09-26 DIAGNOSIS — E785 Hyperlipidemia, unspecified: Secondary | ICD-10-CM

## 2022-09-26 DIAGNOSIS — I4891 Unspecified atrial fibrillation: Secondary | ICD-10-CM

## 2022-09-26 DIAGNOSIS — IMO0002 Ulcer: Secondary | ICD-10-CM

## 2022-09-26 DIAGNOSIS — K5900 Constipation, unspecified: Secondary | ICD-10-CM

## 2022-09-26 DIAGNOSIS — K219 Gastro-esophageal reflux disease without esophagitis: Secondary | ICD-10-CM

## 2022-09-26 DIAGNOSIS — M549 Dorsalgia, unspecified: Secondary | ICD-10-CM

## 2022-09-26 DIAGNOSIS — M5136 Other intervertebral disc degeneration, lumbar region: Secondary | ICD-10-CM

## 2022-09-26 DIAGNOSIS — R1310 Dysphagia, unspecified: Secondary | ICD-10-CM

## 2022-09-26 DIAGNOSIS — E669 Obesity, unspecified: Secondary | ICD-10-CM

## 2022-09-26 DIAGNOSIS — I255 Ischemic cardiomyopathy: Secondary | ICD-10-CM

## 2022-09-26 DIAGNOSIS — H269 Unspecified cataract: Secondary | ICD-10-CM

## 2022-09-26 DIAGNOSIS — R7303 Prediabetes: Secondary | ICD-10-CM

## 2022-09-26 DIAGNOSIS — J302 Other seasonal allergic rhinitis: Secondary | ICD-10-CM

## 2022-09-26 DIAGNOSIS — G4733 Obstructive sleep apnea (adult) (pediatric): Secondary | ICD-10-CM

## 2022-09-26 DIAGNOSIS — N529 Male erectile dysfunction, unspecified: Secondary | ICD-10-CM

## 2022-09-26 DIAGNOSIS — T148XXA Other injury of unspecified body region, initial encounter: Secondary | ICD-10-CM

## 2022-09-26 DIAGNOSIS — J449 Chronic obstructive pulmonary disease, unspecified: Secondary | ICD-10-CM

## 2022-09-26 DIAGNOSIS — M4800 Spinal stenosis, site unspecified: Secondary | ICD-10-CM

## 2022-09-26 DIAGNOSIS — G5600 Carpal tunnel syndrome, unspecified upper limb: Secondary | ICD-10-CM

## 2022-09-26 DIAGNOSIS — I10 Essential (primary) hypertension: Secondary | ICD-10-CM

## 2022-09-26 DIAGNOSIS — I219 Acute myocardial infarction, unspecified: Secondary | ICD-10-CM

## 2022-09-26 DIAGNOSIS — Z95810 Presence of automatic (implantable) cardiac defibrillator: Secondary | ICD-10-CM

## 2022-09-26 MED ORDER — FAMOTIDINE 20 MG PO TAB
20 mg | ORAL_TABLET | Freq: Every evening | ORAL | 1 refills | 90.00000 days | Status: AC
Start: 2022-09-26 — End: ?

## 2022-09-26 MED ORDER — OMEPRAZOLE 40 MG PO CPDR
40 mg | ORAL_CAPSULE | Freq: Two times a day (BID) | ORAL | 1 refills | Status: AC
Start: 2022-09-26 — End: ?

## 2022-09-26 NOTE — Progress Notes
Date of Service: 09/26/2022    Subjective:             Johnny [name] is a 77 y.o. male here for evaluation.     History of Present Illness         The patient, referred by Dr. Suan Halter, presents with a history of gastroesophageal reflux disease (GERD), atrial fibrillation (AFib), and chronic obstructive pulmonary disease (COPD). The patient reports experiencing abdominal discomfort, nausea, vomiting, bloating, and changes in bowel habits. The discomfort is described as being diffuse and more pronounced in the upper abdomen, often occurring throughout the day but also can occur at night. The patient also reports episodes of heartburn and regurgitation, with one instance of a particularly acidic belch.    The patient has been managing the reflux symptoms with Rolaids and previously Tums, along with pantoprazole 40mg  BID after breakfast and dinner and famotidine after dinner. However, the patient reports increased gas production with Tums and has switched to Rolaids recently with noted improvement. EGD at [facility] in 2019 with noted hiatal hernia per his report, uncertain about Barrett's diagnosis, remembers there being some chronic changes but did not tell him that he needed any routine follow up.       He battles constipation and takes 4 grams of fiber from gummies daily and consumes seven to eight prunes nightly. In addition will use stool softeners 1 in the AM and 2 at night.  Has tried citrucel and made his symptoms worse with bloating and gas.  Will also use miralax as last resort as it causes cramps.  Despite the constipation and medications he is taking for this, reports urgency for the bathroom at times and fecal leakage of both liquid, soft and formed stools with smearing even with the passage of gas.        The patient has a history of hemorrhoids and has undergone previous treatments with a local surgeon but still feels discomfort.   Currently, the patient is using a 2.5% cortisone cream for the hemorrhoids, which has provided some relief. However, the patient reports that the hemorrhoids can become aggravated, leading to discomfort.        The patient has a history of open-heart surgery in 2006 and has been experiencing more frequent episodes of AFib recently.       The patient also underwent a colonoscopy approximately 2-3 years ago, with a recommendation for a follow-up in 5 years. The patient has not reported any significant changes in bowel habits since these procedures.        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    Heart attack (HCC) possible in 2006    Hiatal hernia     Hyperlipemia 02/11/2009    Hypertension 02/11/2009  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    Joint pain 1966    Mitral regurgitation     Myocardial infarction (HCC) 2006    Obesity, Class I, 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)     OSA on CPAP 02/11/2009    Osteoarthritis     Pre-diabetes     S/P ICD (internal cardiac defibrillator) procedure 02/11/2009    Seasonal allergies     Spinal stenosis 2010    Ulcer      Surgical History:   Procedure Laterality Date    HUMERUS FRACTURE SURGERY Left 2004    MITRAL VALVULOPLASTY  2006    same time as bypass    CORONARY ARTERY BYPASS GRAFT  04/14/2005    CABGx5 LIMA-LAD, L Rad-Dx, sSVG-RCA-RCA, sSVG-OM & MV Plasty for MR:    RHYTHM DEVICE PLACEMENT  2007    defibrillator    FOOT SURGERY Bilateral 2012    Tarsal tunnel release w/ bunionectomy and hammer toe     EAR SURGERY Bilateral 2012    Ear Tubes    SINUS SURGERY  10/2011    CARDIOVASCULAR STRESS TEST  09/2013    KNEE REPLACEMENT Right 05/2014    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    Performed by Simonne Maffucci, MD at [facility] OR    TRANSVENOUS REMOVAL IMPLANTABLE DEFIBRILLATOR RIGHT VENTRICULAR LEAD Left 03/13/2018    Performed by Kathreen Cornfield, MD at [facility] CVOR    INSERTION RIGHT VENTRICULAR LEAD Left 03/13/2018    Performed by Kathreen Cornfield, MD at [facility] CVOR    FLUOROSCOPY - CARDIAC  03/13/2018    Performed by Kathreen Cornfield, MD at [facility] CVOR    INSERTION/ REPLACEMENT TEMPORARY PACEMAKER LEAD/ CATHETER  03/13/2018    Performed by Kathreen Cornfield, MD at [facility] CVOR    REMOVAL AND REPLACEMENT IMPLANTABLE DEFIBRILLATOR GENERATOR - DUAL LEAD SYSTEM  03/13/2018    Performed by Kathreen Cornfield, MD at [facility] CVOR    CARDIOTHORACIC SURGERY STANDBY N/A 03/13/2018    Performed by Cath, Physician at [facility] CVOR    INTRACARDIAC CATHETER ABLATION WITH COMPREHENSIVE ELECTROPHYSIOLOGIC EVALUATION - ATRIAL FIBRILLATION, RADIOFREQUENCY N/A 11/22/2018    Performed by Kathreen Cornfield, MD at [facility] EP LAB    INTRACARDIAC ELECTROPHYSIOLOGIC 3-DIMENSIONAL MAPPING N/A 11/22/2018    Performed by Kathreen Cornfield, MD at [facility] EP LAB    INTRACARDIAC ECHOCARDIOGRAPHY N/A 11/22/2018    Performed by Kathreen Cornfield, MD at [facility] EP LAB    POSSIBLE INTRAVENOUS DRUG INFUSION FOR STIMULATION AND PACING N/A 11/22/2018    Performed by Kathreen Cornfield, MD at [facility] EP LAB    POSSIBLE INSERTION ESOPHAGEAL DEVIATOR N/A 11/22/2018    Performed by Kathreen Cornfield, MD at [facility] EP LAB    POSSIBLE EXTERNAL CARDIOVERSION N/A 11/22/2018    Performed by Kathreen Cornfield, MD at [facility] EP LAB    INTRACARDIAC CATHETER ABLATION WITH COMPREHENSIVE ELECTROPHYSIOLOGIC EVALUATION - ATYPICAL FLUTTER Right 11/22/2018    Performed by Kathreen Cornfield, MD at [facility] EP LAB    TRANSESOPHAGEAL ECHOCARDIOGRAM DURING INTERVENTION N/A 11/22/2018    Performed by Cath, Physician at [facility] EP LAB    INSERTION SPINAL NEUROSTIMULATOR PULSE GENERATOR/ RECEIVER N/A 11/17/2021    Performed by Evelina Bucy, MD at [facility] OR    PERCUTANEOUS IMPLANTATION EPIDURAL NEUROSTIMULATOR ELECTRODE ARRAY x2 N/A 11/17/2021    Performed by Evelina Bucy, MD at [facility] OR  ELECTRONIC ANALYSIS IMPLANTED NEUROSTIMULATOR PULSE GENERATOR SYSTEM - COMPLEX SPINAL CORD/ PERIPHERAL NEUROSTIMULATOR PULSE GENERATOR/ TRANSMITTER WITH INTRA OPERATIVE/ SUBSEQUENT PROGRAMING N/A 11/17/2021    Performed by Evelina Bucy, MD at [facility] OR    CARDIAC DEFIBRILLATOR PLACEMENT  11/2013, 02-2018    S/P Fidelis ICD Lead Extraction and New Lead Implantation    HX BACK SURGERY  2013, 2006    Lumbart Laminectomy and Fusion    HX CARPAL TUNNEL RELEASE Right 1990s    HX CORONARY ARTERY BYPASS GRAFT  2006    HX ENDOSCOPY  2019    HX HEART CATHETERIZATION      HX HEMORRHOIDECTOMY  2021, 2022    KNEE SURGERY  2015, 2016, 2020    UMBILICAL ARTERIAL CATH - BEDSIDE  2006     Family History   Problem Relation Age of Onset    Cancer Father     Heart problem Father     Heart Disease Father     Hypertension Mother     Joint Pain Mother     Cancer Mother     Cancer Sister      Social History     Socioeconomic History    Marital status: Married   Tobacco Use    Smoking status: Never    Smokeless tobacco: Never   Vaping Use    Vaping Use: Never used   Substance and Sexual Activity    Alcohol use: No     Comment: very rarely 4 drinks per year    Drug use: No    Sexual activity: Yes     Partners: Female     Birth control/protection: None     Vaping/E-liquid Use    Vaping Use Never User                    Review of Systems   Constitutional: Negative.    HENT: Negative.     Eyes: Negative.    Respiratory: Negative.     Cardiovascular: Negative.    Gastrointestinal:  Positive for abdominal distention, constipation and diarrhea.   Endocrine: Negative.    Genitourinary: Negative.    Musculoskeletal: Negative.    Skin: Negative.    Allergic/Immunologic: Negative.    Neurological: Negative.    Hematological: Negative.    Psychiatric/Behavioral: Negative.           Objective:          acetaminophen (TYLENOL) 325 mg tablet Take two tablets by mouth every 4 hours as needed for Pain.    albuterol (VENTOLIN HFA, PROAIR HFA) 90 mcg/actuation inhaler Inhale two puffs by mouth into the lungs four times daily as needed for Wheezing.    aspirin 81 mg chewable tablet Take 1 Tab by mouth daily. WAIT to restart until finished taking lovenox injections. (Patient taking differently: Chew one tablet by mouth at bedtime daily.)    carbidopa/levodopa (SINEMET) 25/100 mg tablet Take 2 tabs in the AM, 2 tabs in the afternoon, and 1 tab in the evening    cetirizine (ZYRTEC) 10 mg tablet Take one tablet by mouth daily.    cholecalciferol (Vitamin D3) (VITAMIN D-3) 1,000 units tablet Take one tablet by mouth twice daily.      dextromethorphan/guaiFENesin (MUCINEX DM) 30/600 mg Tb12 Take two tablets by mouth daily as needed.    docusate (COLACE) 100 mg capsule Take two capsules by mouth at bedtime daily. Pt is taking 550 mg daily    ezetimibe (ZETIA) 10 mg tablet TAKE 1 TABLET  BY MOUTH AT BEDTIME    famotidine (PEPCID) 20 mg tablet Take one tablet by mouth at bedtime daily. Indications: gastroesophageal reflux disease    fexofenadine(+) (ALLEGRA) 180 mg tablet Take one tablet by mouth daily.    fluticasone-umeclidin-vilanter (TRELEGY ELLIPTA) 100-62.5-25 mcg inhaler Inhale 1 Dose by mouth into the lungs at bedtime daily.    furosemide (LASIX) 40 mg tablet TAKE 2 TABLETS BY MOUTH DAILY TAKE 3 TABLETS ON MONDAY/WEDNESDAY/ FRIDAY AS DIRECTED BY CARDIOLOGY    glucosamine su 2KCl-chondroit 500-400 mg tab Take 1 Tab by mouth twice daily.    ipratropium bromide (ATROVENT) 42 mcg (0.06 %) nasal spray Apply two sprays to each nostril as directed twice daily as needed.    losartan (COZAAR) 50 mg tablet TAKE 1 TABLET BY MOUTH DAILY    metoprolol succinate XL (TOPROL XL) 100 mg extended release tablet TAKE 1 TABLET BY MOUTH EVERY DAY    mucus clearing device (AEROBIKA OSCILLATING PEP SYSTM MISC) Use  as directed. Use with salt water and inhale twice daily    MYRBETRIQ 50 mg tablet Take one tablet by mouth daily. Does not take on days he takes Ditropan    OMEGA-3 FATTY ACIDS-FISH OIL PO Take 1 capsule by mouth twice daily.    omeprazole DR (PRILOSEC) 40 mg capsule Take one capsule by mouth twice daily. Indications: gastroesophageal reflux disease    other medication mupirocin ointment and budesonide solution mixed with warm water  Use as nasal irrigation twice daily    polyethylene glycol 3350 (GLYCOLAX; MIRALAX) 17 gram/dose powder Take 17 g by mouth daily. (Patient taking differently: Take seventeen g by mouth nightly as needed.)    potassium chloride SR (K-DUR) 20 mEq tablet TAKE 2 TABLETS BY MOUTH ON MONDAY, WEDNESDAY AND FRIDAY AND TAKE 1 TABLET ALL OTHER DAYS    predniSONE (DELTASONE) 10 mg tablet Take one tablet by mouth daily.    pyRIDostigmine bromide (MESTINON) 60 mg tablet Take one tablet by mouth three times daily. 1.5 tablets 3 times daily    rivaroxaban (XARELTO PO) Take  by mouth.    rosuvastatin (CRESTOR) 40 mg tablet TAKE 1 TABLET BY MOUTH AT BEDTIME    spironolactone (ALDACTONE) 25 mg tablet TAKE 1 TABLET BY MOUTH EVERY DAY TAKE WITH FOOD    testosterone cypionate 200 mg/mL kit Inject 1 mL into the muscle every 14 days.    tiZANidine (ZANAFLEX) 2 mg tablet TAKE 1 TO 3 TABLETS BY MOUTH AT BEDTIME AS NEEDED    traMADoL (ULTRAM) 50 mg tablet Take one tablet by mouth every 8 hours as needed for Pain.    traZODone (DESYREL) 100 mg tablet 1.5 tablets.    Vacuum Erection Device System kit Use as directed for sexual activity.     Vitals:    09/26/22 0901   BP: 115/66   BP Source: Arm, Right Upper   Pulse: 87   Weight: 100.7 kg (222 lb)   Height: 180.3 cm (5' 10.98)     Body mass index is 30.98 kg/m?Marland Kitchen     Physical Exam  Constitutional:       Appearance: Normal appearance.   HENT:      Head: Atraumatic.   Eyes:      Extraocular Movements: Extraocular movements intact.   Abdominal:      General: There is no distension.      Palpations: Abdomen is soft. There is no mass.      Tenderness: There is no abdominal tenderness. There is no guarding  or rebound.      Hernia: No hernia is present.      Comments: prior scars from open heart surgery, increased abdominal girth   Genitourinary:     Comments: perianal exam not completed today as plans for flex sig  Skin:     General: Skin is warm and dry.   Neurological:      General: No focal deficit present.      Mental Status: He is alert and oriented to person, place, and time.   Psychiatric:         Mood and Affect: Mood normal.         Behavior: Behavior normal.              Assessment and Plan:             Gastroesophageal Reflux Disease (GERD), rare dysphagia: Patient reports symptoms of heartburn, regurgitation, and discomfort in the upper abdomen. History of hiatal hernia and uncertainty of Barrett's esophagus. Currently on pantoprazole BID and famotidine, but timing of medication administration needs adjustment.  -Adjust timing of PPI to 30-60 minutes before breakfast and dinner. Adjust timing of famotidine to bedtime. Will switch to omeprazole from pantoprazole.  Schedule an upper endoscopy to assess current state of esophagus and hiatal hernia.  -May use Rolaids as needed    Bloating and Gas: Patient reports significant gas, particularly when taking Tums and Citrucel.  -Consider dietary adjustments, may not tolerate fiber adjustments and monitor response to changes in GERD medication regimen.    Fecal Incontinence: Patient reports urgency and occasional fecal leakage, possibly related to prior hemorrhoid surgeries and current hemorrhoid issues. He battles constipation and is on multiple therapies to keep his bowel moving which he does feel are working well.  Incontinence to both liquid and solid stool.   -Refer to pelvic floor therapy for strengthening of anal sphincter. Requests a location closer to [facility] and will provide him options.   -Schedule a flexible sigmoidoscopy (FlexSig) to assess internal and external hemorrhoids and anal canal.  -Referral to colorectal surgery.     Hemorrhoids: Patient has a history of hemorrhoid surgeries and currently experiences discomfort from external hemorrhoids.  -Recommend over-the-counter hemorrhoid suppositories for use at bedtime for 14 days.   -Will assess further with flex sigmoidoscopy.   -Refer to colorectal surgery for further assessment and potential treatment options.    Atrial Fibrillation (AFib): Patient reports episodes of AFib and is seeing cardiology and they are discussing adjustments, recommend follow up.   -Advise patient to follow up with cardiology to ensure AFib is well-controlled prior to scheduling procedures.     Follow-up: Schedule a telehealth visit in a couple of months to review findings with APP and adjust treatment plan as necessary.

## 2022-09-27 ENCOUNTER — Encounter: Admit: 2022-09-27 | Discharge: 2022-09-27 | Payer: MEDICARE

## 2022-09-29 ENCOUNTER — Encounter: Admit: 2022-09-29 | Discharge: 2022-09-29 | Payer: MEDICARE

## 2022-09-29 NOTE — Anesthesia Pre-Procedure Evaluation
Anesthesia Pre-Procedure Evaluation    Name: Johnny [name]      MRN: [id number]     DOB: [birth year]     Age: 77 y.o.     Sex: male   _________________________________________________________________________     Procedure Info:   Procedure Information       Date/Time: 09/30/22 0830    Procedures:       SIGMOIDOSCOPY DIAGNOSTIC WITH COLLECTION SPECIMEN BY BRUSHING/ WASHING - FLEXIBLE      ESOPHAGOGASTRODUODENOSCOPY WITH BIOPSY - FLEXIBLE - evaluate for Hiatal hernia    Location: ENDO 3 / ENDO/GI    Surgeons: Jolee Ewing, MD            Physical Assessment  Vital Signs (last filed in past 24 hours):         Patient History   Allergies   Allergen Reactions    Clarithromycin RASH     Allergy recorded in SMS: Biaxin~Reactions: RASH~THRUSH    Ketoconazole SEE COMMENTS     Had bloodshot eyes after use, and sore.    Lisinopril COUGH    Oxycodone UNKNOWN     Nightmare        Current Medications    Medication Directions   acetaminophen (TYLENOL) 325 mg tablet Take two tablets by mouth every 4 hours as needed for Pain.   albuterol (VENTOLIN HFA, PROAIR HFA) 90 mcg/actuation inhaler Inhale two puffs by mouth into the lungs four times daily as needed for Wheezing.   aspirin 81 mg chewable tablet Take 1 Tab by mouth daily. WAIT to restart until finished taking lovenox injections.  Patient taking differently: Chew one tablet by mouth at bedtime daily.   carbidopa/levodopa (SINEMET) 25/100 mg tablet Take 2 tabs in the AM, 2 tabs in the afternoon, and 1 tab in the evening   cetirizine (ZYRTEC) 10 mg tablet Take one tablet by mouth daily.   cholecalciferol (Vitamin D3) (VITAMIN D-3) 1,000 units tablet Take one tablet by mouth twice daily.     dextromethorphan/guaiFENesin (MUCINEX DM) 30/600 mg Tb12 Take two tablets by mouth daily as needed.   docusate (COLACE) 100 mg capsule Take two capsules by mouth at bedtime daily. Pt is taking 550 mg daily   ezetimibe (ZETIA) 10 mg tablet TAKE 1 TABLET BY MOUTH AT BEDTIME   famotidine (PEPCID) 20 mg tablet Take one tablet by mouth at bedtime daily. Indications: gastroesophageal reflux disease   fexofenadine(+) (ALLEGRA) 180 mg tablet Take one tablet by mouth daily.   fluticasone-umeclidin-vilanter (TRELEGY ELLIPTA) 100-62.5-25 mcg inhaler Inhale 1 Dose by mouth into the lungs at bedtime daily.   furosemide (LASIX) 40 mg tablet TAKE 2 TABLETS BY MOUTH DAILY TAKE 3 TABLETS ON MONDAY/WEDNESDAY/ FRIDAY AS DIRECTED BY CARDIOLOGY   glucosamine su 2KCl-chondroit 500-400 mg tab Take 1 Tab by mouth twice daily.   ipratropium bromide (ATROVENT) 42 mcg (0.06 %) nasal spray Apply two sprays to each nostril as directed twice daily as needed.   losartan (COZAAR) 50 mg tablet TAKE 1 TABLET BY MOUTH DAILY   metoprolol succinate XL (TOPROL XL) 100 mg extended release tablet TAKE 1 TABLET BY MOUTH EVERY DAY   mucus clearing device (AEROBIKA OSCILLATING PEP SYSTM MISC) Use  as directed. Use with salt water and inhale twice daily   MYRBETRIQ 50 mg tablet Take one tablet by mouth daily. Does not take on days he takes Ditropan   OMEGA-3 FATTY ACIDS-FISH OIL PO Take 1 capsule by mouth twice daily.   omeprazole DR (PRILOSEC) 40 mg capsule  Take one capsule by mouth twice daily. Indications: gastroesophageal reflux disease   other medication mupirocin ointment and budesonide solution mixed with warm water  Use as nasal irrigation twice daily   polyethylene glycol 3350 (GLYCOLAX; MIRALAX) 17 gram/dose powder Take 17 g by mouth daily.  Patient taking differently: Take seventeen g by mouth nightly as needed.   potassium chloride SR (K-DUR) 20 mEq tablet TAKE 2 TABLETS BY MOUTH ON MONDAY, WEDNESDAY AND FRIDAY AND TAKE 1 TABLET ALL OTHER DAYS   predniSONE (DELTASONE) 10 mg tablet Take one tablet by mouth daily.   pyRIDostigmine bromide (MESTINON) 60 mg tablet Take one tablet by mouth three times daily. 1.5 tablets 3 times daily   rivaroxaban (XARELTO PO) Take  by mouth.   rosuvastatin (CRESTOR) 40 mg tablet TAKE 1 TABLET BY MOUTH AT BEDTIME   spironolactone (ALDACTONE) 25 mg tablet TAKE 1 TABLET BY MOUTH EVERY DAY TAKE WITH FOOD   testosterone cypionate 200 mg/mL kit Inject 1 mL into the muscle every 14 days.   tiZANidine (ZANAFLEX) 2 mg tablet TAKE 1 TO 3 TABLETS BY MOUTH AT BEDTIME AS NEEDED   traMADoL (ULTRAM) 50 mg tablet Take one tablet by mouth every 8 hours as needed for Pain.   traZODone (DESYREL) 100 mg tablet 1.5 tablets.   Vacuum Erection Device System kit Use as directed for sexual activity.       Review of Systems/Medical History      Patient summary reviewed  Pertinent labs reviewed    PONV Screening: Non-smoker    No family history of anesthetic complications      Airway - negative        Pulmonary       Not a current smoker (never)        Asthma      COPD (trelegy daily, albuterol prn - uses rarely. )        Recent URI:  .        Obstructive Sleep Apnea          Interventions: CPAP; compliant      Follows with Dr. Charisse Klinefelter pulmonology at [facility] in [facility]        Cardiovascular       Recent diagnostic studies:          ECG          10/17/18 EKG: Atrial paced rhythm. Borderline prolonged pr interval. Nonspecific IVCD    06/11/18 Echo (outside records)  Left ventricular systolic function is mildly reduced, LVEF 50 to 55% and stage II diastolic dysfunction with elevated mean left atrial pressure  Abnormal septal motion consistent with RV pacemaker  Reduced right ventricular systolic function and normal right ventricular diastolic function  Mild left atrial enlargement and mild right atrial enlargement  Status post mitral annular ring insertion.  The mean diastolic gradient across the mitral valve is 2 mmHg at a heart rate of 88 bpm.  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  There is no obvious right to left shunt at rest, with cough or with Valsalva on agitated saline contrast examination.      01/15/2018 MPI stress (outside records)  No diagnostic stress ECG changes with regadenson infusion.  No diagnostic ST shifts were noted.  No clear evidence of scan ischemia.  Normal LV wall motion and function.  Calculated poststress LV ejection  fraction of 57%.      Exercise tolerance: unknown (swims for exercise twice weekly x 60 min at slow rate. denies CP/SOB)       Beta Blocker therapy: Yes      Beta blockers within 24 hours: Yes      Hypertension, well controlled          Valvular problems/murmurs (mitral valve repair 2006):          Past MI    Coronary artery disease      Coronary artery bypass graft (5 vessel 2006)        No palpitations          Dysrhythmias (eliquis); atrial fibrillation    No angina      CHF (Ischemic Cardiomyopathy)      Hyperlipidemia (statin)      Dyspnea on exertion      81 mg [name] daily        GI/Hepatic/Renal         Hiatal hernia        GERD (PPI, H2B, elevated HOB), well controlled        No liver disease:         Renal disease (pt denies. CKD stage II per outsdie records):           Neuro/Psych         Parkinson's Disease      Musculoskeletal         Back pain (chronic LBP)      Arthritis:         Endocrine/Other       Diabetes (pre diabetic per patient), well controlled          Obesity      Constitution - negative       PHYSICAL EXAM     Previous Airway Procedure Notes Displaying the 3 most recent records   No records found.         Patient Lines/Drains/Airways Status       Active Lines:       Name Placement date Placement time Site Days    Peripheral IV 06/13/22 Right Distal;Posterior Forearm 22 G 06/13/22  --  -- 108                  Diagnostic Tests  Hematology:   Lab Results   Component Value Date    HGB 14.7 06/09/2020    HCT 47.1 06/09/2020    PLTCT 164 06/09/2020    WBC 14.9 06/09/2020    NEUT 92 11/22/2018    ANC 13.00 11/22/2018    ALC 0.70 11/22/2018    MONA 3 11/22/2018    AMC 0.50 11/22/2018    EOSA 0 11/22/2018    ABC 0.00 11/22/2018    MCV 84.6 06/09/2020    MCH 26.4 06/09/2020    MCHC 31.2 06/09/2020    MPV 8.0 11/23/2018    RDW 16.4 06/09/2020         General Chemistry:   Lab Results   Component Value Date    NA 139 03/31/2021    K 4.0 03/31/2021    CL 100 03/31/2021    CO2 29 03/31/2021    GAP 10 03/31/2021    BUN 33 03/31/2021    CR 1.30 03/31/2021    GLU 102 03/31/2021    GLU 123 03/15/2006    CA 8.7 03/31/2021    ALBUMIN 3.2 11/22/2018    OBSCA 1.08 04/15/2005    MG 1.9 07/10/2019  TOTBILI 0.8 11/22/2018    PO4 3.0 12/14/2009      Coagulation:   Lab Results   Component Value Date    PT 13.5 03/15/2006    PTT 31.0 08/29/2014    INR Error 03/23/2022    INR 1.5 11/25/2018    INR 1.6 08/24/2018       PAC Plan    Anesthesia Plan    [name] score: 3   Plan: MAC        Alerts: No

## 2022-09-29 NOTE — Telephone Encounter
Outbound call to pt. LVM. Provided call back number. Mychart message also sent to pt.

## 2022-09-30 ENCOUNTER — Encounter: Admit: 2022-09-30 | Discharge: 2022-09-30 | Payer: MEDICARE

## 2022-09-30 ENCOUNTER — Ambulatory Visit: Admit: 2022-09-30 | Discharge: 2022-09-30 | Payer: MEDICARE

## 2022-09-30 DIAGNOSIS — K219 Gastro-esophageal reflux disease without esophagitis: Secondary | ICD-10-CM

## 2022-09-30 DIAGNOSIS — M5136 Other intervertebral disc degeneration, lumbar region: Secondary | ICD-10-CM

## 2022-09-30 DIAGNOSIS — M2550 Pain in unspecified joint: Secondary | ICD-10-CM

## 2022-09-30 DIAGNOSIS — M549 Dorsalgia, unspecified: Secondary | ICD-10-CM

## 2022-09-30 DIAGNOSIS — K449 Diaphragmatic hernia without obstruction or gangrene: Secondary | ICD-10-CM

## 2022-09-30 DIAGNOSIS — J449 Chronic obstructive pulmonary disease, unspecified: Secondary | ICD-10-CM

## 2022-09-30 DIAGNOSIS — I10 Essential (primary) hypertension: Secondary | ICD-10-CM

## 2022-09-30 DIAGNOSIS — G4733 Obstructive sleep apnea (adult) (pediatric): Secondary | ICD-10-CM

## 2022-09-30 DIAGNOSIS — I340 Nonrheumatic mitral (valve) insufficiency: Secondary | ICD-10-CM

## 2022-09-30 DIAGNOSIS — H269 Unspecified cataract: Secondary | ICD-10-CM

## 2022-09-30 DIAGNOSIS — M1990 Unspecified osteoarthritis, unspecified site: Secondary | ICD-10-CM

## 2022-09-30 DIAGNOSIS — I255 Ischemic cardiomyopathy: Secondary | ICD-10-CM

## 2022-09-30 DIAGNOSIS — T148XXA Other injury of unspecified body region, initial encounter: Secondary | ICD-10-CM

## 2022-09-30 DIAGNOSIS — J302 Other seasonal allergic rhinitis: Secondary | ICD-10-CM

## 2022-09-30 DIAGNOSIS — I4891 Unspecified atrial fibrillation: Secondary | ICD-10-CM

## 2022-09-30 DIAGNOSIS — IMO0002 Ulcer: Secondary | ICD-10-CM

## 2022-09-30 DIAGNOSIS — N529 Male erectile dysfunction, unspecified: Secondary | ICD-10-CM

## 2022-09-30 DIAGNOSIS — D699 Hemorrhagic condition, unspecified: Secondary | ICD-10-CM

## 2022-09-30 DIAGNOSIS — E669 Obesity, unspecified: Secondary | ICD-10-CM

## 2022-09-30 DIAGNOSIS — I219 Acute myocardial infarction, unspecified: Secondary | ICD-10-CM

## 2022-09-30 DIAGNOSIS — G5600 Carpal tunnel syndrome, unspecified upper limb: Secondary | ICD-10-CM

## 2022-09-30 DIAGNOSIS — M4800 Spinal stenosis, site unspecified: Secondary | ICD-10-CM

## 2022-09-30 DIAGNOSIS — R7303 Prediabetes: Secondary | ICD-10-CM

## 2022-09-30 DIAGNOSIS — Z95810 Presence of automatic (implantable) cardiac defibrillator: Secondary | ICD-10-CM

## 2022-09-30 DIAGNOSIS — R053 Chronic cough: Secondary | ICD-10-CM

## 2022-09-30 DIAGNOSIS — I2510 Atherosclerotic heart disease of native coronary artery without angina pectoris: Secondary | ICD-10-CM

## 2022-09-30 DIAGNOSIS — K5900 Constipation, unspecified: Secondary | ICD-10-CM

## 2022-09-30 DIAGNOSIS — H9190 Unspecified hearing loss, unspecified ear: Secondary | ICD-10-CM

## 2022-09-30 DIAGNOSIS — I509 Heart failure, unspecified: Secondary | ICD-10-CM

## 2022-09-30 DIAGNOSIS — E785 Hyperlipidemia, unspecified: Secondary | ICD-10-CM

## 2022-09-30 MED ORDER — PROPOFOL 10 MG/ML IV EMUL 20 ML (INFUSION)(AM)(OR)
INTRAVENOUS | 0 refills | Status: DC
Start: 2022-09-30 — End: 2022-09-30

## 2022-09-30 MED ORDER — FENTANYL CITRATE (PF) 50 MCG/ML IJ SOLN
INTRAVENOUS | 0 refills | Status: DC
Start: 2022-09-30 — End: 2022-09-30

## 2022-09-30 MED ORDER — PHENYLEPHRINE HCL IN 0.9% NACL 1 MG/10 ML (100 MCG/ML) IV SYRG
INTRAVENOUS | 0 refills | Status: DC
Start: 2022-09-30 — End: 2022-09-30

## 2022-09-30 MED ORDER — LIDOCAINE (PF) 20 MG/ML (2 %) IJ SOLN
INTRAVENOUS | 0 refills | Status: DC
Start: 2022-09-30 — End: 2022-09-30

## 2022-09-30 MED ORDER — LACTATED RINGERS IV SOLP
INTRAVENOUS | 0 refills | Status: DC
Start: 2022-09-30 — End: 2022-09-30

## 2022-09-30 MED ORDER — PROPOFOL INJ 10 MG/ML IV VIAL
INTRAVENOUS | 0 refills | Status: DC
Start: 2022-09-30 — End: 2022-09-30

## 2022-10-02 ENCOUNTER — Encounter: Admit: 2022-10-02 | Discharge: 2022-10-02 | Payer: MEDICARE

## 2022-10-02 DIAGNOSIS — I2510 Atherosclerotic heart disease of native coronary artery without angina pectoris: Secondary | ICD-10-CM

## 2022-10-02 DIAGNOSIS — M549 Dorsalgia, unspecified: Secondary | ICD-10-CM

## 2022-10-02 DIAGNOSIS — J449 Chronic obstructive pulmonary disease, unspecified: Secondary | ICD-10-CM

## 2022-10-02 DIAGNOSIS — I10 Essential (primary) hypertension: Secondary | ICD-10-CM

## 2022-10-02 DIAGNOSIS — R7303 Prediabetes: Secondary | ICD-10-CM

## 2022-10-02 DIAGNOSIS — E785 Hyperlipidemia, unspecified: Secondary | ICD-10-CM

## 2022-10-02 DIAGNOSIS — M2550 Pain in unspecified joint: Secondary | ICD-10-CM

## 2022-10-02 DIAGNOSIS — T148XXA Other injury of unspecified body region, initial encounter: Secondary | ICD-10-CM

## 2022-10-02 DIAGNOSIS — G5600 Carpal tunnel syndrome, unspecified upper limb: Secondary | ICD-10-CM

## 2022-10-02 DIAGNOSIS — R053 Chronic cough: Secondary | ICD-10-CM

## 2022-10-02 DIAGNOSIS — N529 Male erectile dysfunction, unspecified: Secondary | ICD-10-CM

## 2022-10-02 DIAGNOSIS — M4800 Spinal stenosis, site unspecified: Secondary | ICD-10-CM

## 2022-10-02 DIAGNOSIS — M5136 Other intervertebral disc degeneration, lumbar region: Secondary | ICD-10-CM

## 2022-10-02 DIAGNOSIS — E669 Obesity, unspecified: Secondary | ICD-10-CM

## 2022-10-02 DIAGNOSIS — I219 Acute myocardial infarction, unspecified: Secondary | ICD-10-CM

## 2022-10-02 DIAGNOSIS — M1990 Unspecified osteoarthritis, unspecified site: Secondary | ICD-10-CM

## 2022-10-02 DIAGNOSIS — J302 Other seasonal allergic rhinitis: Secondary | ICD-10-CM

## 2022-10-02 DIAGNOSIS — K219 Gastro-esophageal reflux disease without esophagitis: Secondary | ICD-10-CM

## 2022-10-02 DIAGNOSIS — K5900 Constipation, unspecified: Secondary | ICD-10-CM

## 2022-10-02 DIAGNOSIS — I340 Nonrheumatic mitral (valve) insufficiency: Secondary | ICD-10-CM

## 2022-10-02 DIAGNOSIS — I509 Heart failure, unspecified: Secondary | ICD-10-CM

## 2022-10-02 DIAGNOSIS — H9190 Unspecified hearing loss, unspecified ear: Secondary | ICD-10-CM

## 2022-10-02 DIAGNOSIS — K449 Diaphragmatic hernia without obstruction or gangrene: Secondary | ICD-10-CM

## 2022-10-02 DIAGNOSIS — Z95810 Presence of automatic (implantable) cardiac defibrillator: Secondary | ICD-10-CM

## 2022-10-02 DIAGNOSIS — H269 Unspecified cataract: Secondary | ICD-10-CM

## 2022-10-02 DIAGNOSIS — G4733 Obstructive sleep apnea (adult) (pediatric): Secondary | ICD-10-CM

## 2022-10-02 DIAGNOSIS — IMO0002 Ulcer: Secondary | ICD-10-CM

## 2022-10-02 DIAGNOSIS — D699 Hemorrhagic condition, unspecified: Secondary | ICD-10-CM

## 2022-10-02 DIAGNOSIS — I255 Ischemic cardiomyopathy: Secondary | ICD-10-CM

## 2022-10-02 DIAGNOSIS — I4891 Unspecified atrial fibrillation: Secondary | ICD-10-CM

## 2022-10-11 ENCOUNTER — Encounter: Admit: 2022-10-11 | Discharge: 2022-10-11 | Payer: MEDICARE

## 2022-10-11 NOTE — Telephone Encounter
Patient last had bilateral L4-5 L5-S1 RFA on 12/24/2020. He reports that this helped his axial low back pain by more than 50% for more than 1.5 years. Pain now returned to his low back. Described as moderate to severe, curtailing his activity and daily tasks. The pain is centered to his low back, axial. He denies radicular pain.  He wishes to repeat the RFA.

## 2022-10-13 ENCOUNTER — Encounter: Admit: 2022-10-13 | Discharge: 2022-10-13 | Payer: MEDICARE

## 2022-10-13 NOTE — Telephone Encounter
New Patient Appointment with Colorectal Surgery (CRS)  Date: Thurs (4.25.24)  Time: 1pm  CRS Provider Name (full name): Dr. Ronne Binning    Clinic Location [facility] or [facility] w/address): [employer], [address], [address], [place], [place] [address]    Verbal directions to appointment given on (date):  1.25.24   Patient has MyChart Access (yes, no w/reason or date instructions sent to pt):  yes     Other Appt/Tests Coordinated with this Appt (n/a or test name w/date & time): n/a    Need Hoyer Lift (yes or n/a. For disabled patients.): n/a    Engineer, technical sales (n/a, Language or Language & ID # if used for this appt): n/a    Patient expressed understanding of below:   -Check-in 15-30 min early, if possible (yes/no):  yes  -Wait List (yes, n/a or pt declined): yes  -No-Show/Late Cancellation (NS/LC) Policy (yes, or no w/reason): yes  The surgeons ask that we let all patients know of the NS/LC Policy here at [facility] for all appointments: If you need to cancel/reschedule, please try and let us know as far in advance as possible. If you cancel after 12PM the day before the appointment, it is considered a NS/LC. When you accumulate NS/LC, it gives the provider the option to review and possibly decline future appointments.     -Insurance Referral Authorization (Tricare Prime, [place] CCN, [employer] (umbrella auth/referral from PCP only), [employer] (Health Dept Approval), [employer], [employer]HC Navigate (umbrella auth/referral from PCP only) [place] [employer] (with name of [place]) or Self-Pay (n/a, Self-Pay or Auth # & date range): n/a    CRS Provider Preference (any or name of provider(s)): any at late am or pm.     Referring Provider (name & specialty): Dr. Belia Heman (GI)   Care Team Updated on (date): 1.25.24    [facility], [place], [place] - Interchanging Pulmonologist following COPD. On daily treatments for COPD.      Diagnosis:   hemorrhoid, anal warts, fecal leakage  Pt replied:    Yes - recent physical exam  Yes - script  No - soaking in bath/sitz bath  Yes - diet reviewed    Patient understands PCP/Referring Provider to follow and assist with managing symptoms until seen by a specialist, and advocate as needed (yes/no):  yes    Patient understands the benefits of establishing care with a specialist and once established, we follow for three(3) years and renews after each visit (yes/no): yes     Verbal records history from (patient, name of family member, caregiver, etc.): patient    Continuation of Care    GI Lab Testing/Procedure Reports with any Associated Pathology (FROM LAST 10 YEARS):   Colonoscopy, EGD, Flexible Sigmoidoscopy, Anorectal Manometry, Capsule Endoscopy, Endoscopic Ultrasound   Patient GI Testing History (Approx year, name of test, facility and/or provider name as needed. Or document, Pt with no memory of provider/facility name.):     Approx 20 yrs ago - in-office flex sig.    Approx 3 colonoscopies and 2 of them with hemorrhoid procedure at same time at [facility], [place], [place]     Approx 2022 - recent colonoscopy and hemorrhoid procedure at same time at [facility], [place], [place]     One EGD at [facility], [place], [place]     1.12.24 - EGD and flex sig at [facility]    Operative Report, Surgical Pathology & Discharge Summary (FROM LIFETIME): Abdominal & Pelvic Surgeries, Colon, Rectal & Anal Surgeries  Patient Abdominal/Pelvic Surgery History (Approx year, non-clinical name of surgery, facility and/or provider name as needed. Or document, Pt with no memory of provider/facility name.):     Approx Two hemorrhoid procedures during colonoscopy at [facility], [place], [place]     2006 - Open heart surgery at [facility]    Radiology Imaging Reports & Cloud Images (FROM LAST 5 YEARS):    CT c/a/p, MRI a/p, MRE, Xray: Abdomen (KUB), Pelvis, Acute Abdominal Series, Small Bowel Follow Thru, PET Scan: Full Body or Skull to Thighs. Colon Single Contrast &/or MRI Defecography, Barium/Gastrographin Enemas, Sitz Marker Study  Patient Radiology History (List name of Hospital(s)/Facilities(s) only):     [place]  [facility], [place], [place]  [facility], [place], [place]     Pelvic Floor Physical Therapy Progress Notes or Biofeedback Testing/Pelvic EMG Testing (FROM LAST 5 YEARS).  Patient Physical Therapy History (n/a or list timeframe w/facility name. Common diagnoses w/PT: constipation, fecal incontinence, pelvic floor dysfunction, & rectal prolapse):     PT on order for local area. Patient calling GI at [phone] to follow up the referral.     Gastroenterology Office Visit Notes/Progress Notes (FROM LAST 5 YEARS).  Patient Office Visit Notes/Progress Notes History: CNC/RN to review and request as needed.     Emailed Records/Referral from Physician Consult to [email or web address] (scan into O2) and corresponding CNC/RN (n/a or RN name w/date): n/a

## 2022-10-14 ENCOUNTER — Encounter: Admit: 2022-10-14 | Discharge: 2022-10-14 | Payer: MEDICARE

## 2022-10-14 NOTE — Telephone Encounter
VM from pt statin he has ome questions on his medications from GI.     Call returned to pt. He is on pepcid and prilosec. Needing to clarify timing. Discussed with pt he should take omeprazole BID before meals and then famotidine before bedtime.   Pt verbalized understanding with no further questions or concerns at this time.

## 2022-10-17 ENCOUNTER — Encounter: Admit: 2022-10-17 | Discharge: 2022-10-17 | Payer: MEDICARE

## 2022-10-17 MED ORDER — RIVAROXABAN 20 MG PO TAB
20 mg | ORAL_TABLET | Freq: Every day | ORAL | 11 refills | 30.00000 days | Status: AC
Start: 2022-10-17 — End: ?
  Filled 2022-10-18: qty 30, 30d supply, fill #1

## 2022-10-18 ENCOUNTER — Encounter: Admit: 2022-10-18 | Discharge: 2022-10-18 | Payer: MEDICARE

## 2022-10-18 ENCOUNTER — Ambulatory Visit: Admit: 2022-10-18 | Discharge: 2022-10-19 | Payer: MEDICARE

## 2022-10-18 DIAGNOSIS — IMO0002 Ulcer: Secondary | ICD-10-CM

## 2022-10-18 DIAGNOSIS — I219 Acute myocardial infarction, unspecified: Secondary | ICD-10-CM

## 2022-10-18 DIAGNOSIS — E669 Obesity, unspecified: Secondary | ICD-10-CM

## 2022-10-18 DIAGNOSIS — J449 Chronic obstructive pulmonary disease, unspecified: Secondary | ICD-10-CM

## 2022-10-18 DIAGNOSIS — K5900 Constipation, unspecified: Secondary | ICD-10-CM

## 2022-10-18 DIAGNOSIS — T148XXA Other injury of unspecified body region, initial encounter: Secondary | ICD-10-CM

## 2022-10-18 DIAGNOSIS — I340 Nonrheumatic mitral (valve) insufficiency: Secondary | ICD-10-CM

## 2022-10-18 DIAGNOSIS — H269 Unspecified cataract: Secondary | ICD-10-CM

## 2022-10-18 DIAGNOSIS — I4891 Unspecified atrial fibrillation: Secondary | ICD-10-CM

## 2022-10-18 DIAGNOSIS — M5136 Other intervertebral disc degeneration, lumbar region: Secondary | ICD-10-CM

## 2022-10-18 DIAGNOSIS — N529 Male erectile dysfunction, unspecified: Secondary | ICD-10-CM

## 2022-10-18 DIAGNOSIS — E785 Hyperlipidemia, unspecified: Secondary | ICD-10-CM

## 2022-10-18 DIAGNOSIS — I10 Essential (primary) hypertension: Secondary | ICD-10-CM

## 2022-10-18 DIAGNOSIS — M4800 Spinal stenosis, site unspecified: Secondary | ICD-10-CM

## 2022-10-18 DIAGNOSIS — R7303 Prediabetes: Secondary | ICD-10-CM

## 2022-10-18 DIAGNOSIS — K449 Diaphragmatic hernia without obstruction or gangrene: Secondary | ICD-10-CM

## 2022-10-18 DIAGNOSIS — I255 Ischemic cardiomyopathy: Secondary | ICD-10-CM

## 2022-10-18 DIAGNOSIS — M549 Dorsalgia, unspecified: Secondary | ICD-10-CM

## 2022-10-18 DIAGNOSIS — M2550 Pain in unspecified joint: Secondary | ICD-10-CM

## 2022-10-18 DIAGNOSIS — R053 Chronic cough: Secondary | ICD-10-CM

## 2022-10-18 DIAGNOSIS — K219 Gastro-esophageal reflux disease without esophagitis: Secondary | ICD-10-CM

## 2022-10-18 DIAGNOSIS — D699 Hemorrhagic condition, unspecified: Secondary | ICD-10-CM

## 2022-10-18 DIAGNOSIS — N3281 Overactive bladder: Secondary | ICD-10-CM

## 2022-10-18 DIAGNOSIS — G4733 Obstructive sleep apnea (adult) (pediatric): Secondary | ICD-10-CM

## 2022-10-18 DIAGNOSIS — G5600 Carpal tunnel syndrome, unspecified upper limb: Secondary | ICD-10-CM

## 2022-10-18 DIAGNOSIS — H9190 Unspecified hearing loss, unspecified ear: Secondary | ICD-10-CM

## 2022-10-18 DIAGNOSIS — I2510 Atherosclerotic heart disease of native coronary artery without angina pectoris: Secondary | ICD-10-CM

## 2022-10-18 DIAGNOSIS — J302 Other seasonal allergic rhinitis: Secondary | ICD-10-CM

## 2022-10-18 DIAGNOSIS — I509 Heart failure, unspecified: Secondary | ICD-10-CM

## 2022-10-18 DIAGNOSIS — Z95810 Presence of automatic (implantable) cardiac defibrillator: Secondary | ICD-10-CM

## 2022-10-18 DIAGNOSIS — M1990 Unspecified osteoarthritis, unspecified site: Secondary | ICD-10-CM

## 2022-10-18 MED ORDER — FESOTERODINE 4 MG PO TB24
4-8 mg | ORAL_TABLET | ORAL | 11 refills | Status: AC
Start: 2022-10-18 — End: ?
  Filled 2022-10-18: qty 30, 30d supply, fill #1

## 2022-10-18 NOTE — Patient Instructions
What is Overactive Bladder (OAB)?  http://urologyhealth.org/urologic-conditions/overactive-bladder-([email or web address]      Overactive bladder (OAB) is the name for a group of urinary symptoms. It is not a disease. The most common symptom is a sudden, uncontrolled need or urge to urinate. Some people will leak urine when they feel this urge. Another symptom is the need to pass urine many times during the day and night. OAB is basically the feeling that you?ve ?gotta? go? to the bathroom urgently and too much.    Leaking urine is called incontinence?. Stress urinary incontinence (SUI), is another common bladder problem. It?s different from OAB. People with SUI leak urine while sneezing, laughing or doing other physical activities. More information on SUI can be found at ForexFest.no.    [employer]  As many as 30 percent of men and 40 percent of women in the [place] live with OAB symptoms. Many people living with OAB don't ask for help. They may feel embarrassed. Many people either don't know how to talk with their health care provider about their symptoms, or they think there aren't treatments that can help.    The truth is there are many treatments that can help. Asking your health care provider about it is the first step.    How OAB Can Affect Your Life  OAB can get in the way of your work, social life, exercise and sleep. Without treatment, OAB symptoms can make it hard to get through the day without many trips to the bathroom. You may not want to go out with friends or go far from home because you're afraid of being far from a bathroom. This makes many people feel lonely and isolated.    OAB may affect relationships with friends and family. It can disrupt your sleep and sex-life. Too little sleep will leave anyone tired and depressed. In addition, if you leak urine, you may develop skin problems or infections.    You don't have to let OAB rule your life. OAB can be controlled. If you think you have OAB, see your health care provider.    The Truth About OAB  OAB is not a normal part of getting older.  OAB is not just part of being a woman.  OAB is not just an issue with the prostate.  OAB is not caused by something you did.  Surgery is not the only treatment for OAB.  There are treatments to help people manage OAB symptoms.  There are treatments to help even minor OAB symptoms.    If you are bothered by OAB symptoms, then you should ask for treatment!    Symptoms  Urgency: The major symptom of OAB is a sudden, strong urge to urinate that you can't ignore. This gotta go feeling makes you fear you will leak if you don't get to a bathroom right away. You may or may not actually leak with this urge to go.    If you live with OAB, you may also:  Leak urine or have ?urge incontinence.? This means urine leaks when you feel the sudden urge to go. This isn?t the same as stress urinary incontinence or SUI . People with SUI leak urine when sneezing, laughing or doing other physical activities.    Urinate frequently. You may need to go to the bathroom many times during the day. The number of times someone urinates varies from person to person. Many experts agree that going to the bathroom more than eight times in 24 hours is ?frequent urination.?  Wake up at night to pass urine. If you have to wake from sleep to go to the bathroom more than once a night, it?s a symptom of OAB or nocturia.    Causes  How the Urinary Tract Works Normally, and What Causes OAB  The urinary tract   is the important system in our bodies that removes liquid waste (urine). It includes the organs that produce, store and pass urine. These are:    Kidneys: two bean-shaped organs that clean waste from the blood and make urine.    Ureters: two thin tubes that take urine from the kidney to the bladder.    Bladder: a balloon-like muscular sac that holds urine until it?s time to go to the bathroom.    Urethra: the tube that carries urine from the bladder out of the body. The urethra has a muscle called a sphincter that locks in urine.    The sphincter muscle opens to release urine when the bladder contracts.    Normally, when your bladder is full of urine waste, your brain signals the bladder. The bladder muscles then squeeze. This forces the urine out through the urethra. The sphincter in the urethra opens and urine flows out. When your bladder is not full, the bladder is relaxed.    With a healthy bladder, signals in your brain let you know that your bladder is getting full or is full, but you can wait to go to the bathroom. With OAB, you can?t wait. You feel a sudden, urgent need to go. This can happen even if your bladder isn?t full.    If the nerve signals between your bladder and brain don?t work properly, OAB can result. The signals might tell your bladder to empty, even when it isn't full. OAB can also be caused when muscles in your bladder are too active. This means that the bladder muscles contract to pass urine before your bladder is full. In turn, this causes a sudden, strong need to urinate. We call this urgency.        Risk Factors for OAB  Neurologic disorders or damage to the signals between your brain and bladder  Hormone changes  Pelvic muscle weakness or spasms  A urinary tract infection  Side effects from a medication  Diseases that affect the brain or spinal cord, like stroke and multiple sclerosis  If you think you have OAB, talk with your health care provider. It?s important to learn why it?s happening so you can manage your symptoms.    Diagnosis  After you talk about your symptoms, your health care provider may do an exam right away. Or, they may refer you to a specialist, such as a urologist who can diagnose and treat OAB. Some urologists specialize in incontinence and OAB.    Medical History  Your exam will begin with questions. Your provider will want to understand your health history and experiences. You should tell them about the symptoms you have, how long you?ve had them, and how they?re changing your life. A medical history will include questions about your past and current health problems. You should bring a list of over-the-counter and prescription drugs you take. You should also tell your provider about your diet and about how much and what kinds of liquids you drink during the day and night.    Physical Exam  Your provider will examine you to look for something that may be causing your symptoms. Doctors will often feel your abdomen, the organs in your  pelvis, and your rectum.    Bladder Diary  You may be asked to keep a Bladder Diaryfor a few weeks. With this, you will note how often you go to the bathroom and any time you leak urine. This will help your health care provider learn more about your day-to-day symptoms. The bladder diary helps you track:    When and how much fluid you drink  When and how much you urinate  How often you have that ?gotta go? urgency feeling  When and how much urine you may leak  Having a Bladder Diary during your first visit can be helpful because it describes your daily habits, your urinary symptoms, and shows your provider how they affect your life. Your doctor will use this information to help treat you.    Other Tests  Urine test: Your health care provider may ask you to leave a sample of your urine to test for infection or blood.  Bladder scan: This type of ultrasound shows how much urine is still in the bladder after you go to the bathroom.  More tests,like a cystoscopy or urodynamic testing, are usually not needed but may be used if your provider thinks something else is going on.    Treatment  There are a number of things you can do to manage OAB. Everyone has a different experience with what works best. Bonita Quin may try one treatment alone, or several at the same time.    You and your health care provider should talk about what you want from treatment and about each option. OAB treatments include:    Lifestyle Changes  Prescription Medications  Bladder Botox? (botulinum toxin) Treatments  Nerve Stimulation (peripheral and central)  Surgery    Lifestyle Changes  For OAB treatment, health care providers may first ask a patient to make lifestyle changes. These changes may also be called behavioral therapy. This could mean you eat different foods, change drinking habits, and pre-plan bathroom visits to feel better. Many people find these changes help.    Other people need to do more, such as:  1. Limit food and drinks that bother the bladder. There are certain foods and drinks known to irritate the bladder. You can start by avoiding diuretics - these drinks include caffeine and alcohol which encourage your body to make more urine. You can also try taking several foods out of your diet, and then add them back one at a time. This will show you which foods make your symptoms worse, so you can avoid them. You can add fiber to your diet to improve digestion. Oatmeal and whole grains are good. Fresh and dried fruit, vegetables, and beans may help. Many people feel better when they change the way they eat and drink.  Some foods and drinks that may affect your bladder:  Coffee/caffeine  Tea  Alcohol  Soda and other fizzy drinks  Some citrus fruits  Tomato-based foods  Chocolate (not white chocolate)  Some spicy foods  2. Keep a bladder diary. Writing down when you make trips to the bathroom for a few days can help you understand your body better. This diary may show you things that make symptoms worse. For example, are your symptoms worse after eating or drinking a certain kind of food? Are they worse when you don?t drink enough liquids?  3. Double voiding. This is when you empty your bladder twice. This may be helpful for people who have trouble fully emptying their bladder. After you go to the  bathroom, you wait a few seconds and then try again.  4. Delayed voiding. This is when you practice waiting before you go to the bathroom, even when you have to go. At first, you wait just a few minutes. Gradually, you may be able to wait two to three hours at a time. Only try this if your health care provider tells you to. Some people feel worse or have urine leaks when they wait too long to go to the bathroom.  5. Timed urination. This means you follow a daily bathroom schedule. Instead of going when you feel the urge, you go at set times during the day. You and your health care provider will create a reasonable schedule. You may try to go every two to four hours, whether you feel you have to or not. The goal is to prevent that urgent feeling and to regain control.  6. Exercises to relax your bladder muscle.  Kegel exercises: tightening and holding your pelvic muscles tight, to strengthen the pelvic floor.  Quick flicks are when you quickly squeeze and relax your pelvic floor muscles over and over again. So, when you feel the urge to go, a number of quick flicks may help control that ?gotta go? feeling. It helps to be still, relax and focus on just the exercise. Your health care provider or a physical therapist can help you learn these exercises.  Biofeedback may also help you learn about your bladder. Biofeedback uses computer graphs and sounds to monitor muscle movement. It can help teach you how your pelvic muscles move and how strong they are.    Prescription Drugs  When lifestyle changes aren?t enough, the next step may be to take medicine. Your health care provider can tell you about special drugs for OAB.    There are several drug types that can relax the bladder muscle. These drugs, like anti-muscarinics and beta-3 agonists, can help stop your bladder from squeezing when it?s not full. Some are taken as pills, by mouth. Others are gels or a sticky transdermal patch to give you the drug through your skin.    Anti-muscarinics and betta-3 adrenoceptor agonists can relax the bladder muscle and increase the amount of urine your bladder can hold and empty. Combination drugs, like using both anti-muscarinics and - betta-3 adrenoceptor agonists together may help control OAB when one option alone isn?t working.    Your health care provider will want to know if the medicine works for you. They will check to see if you get relief or if the drug causes problems, known as side effects. Some people get dry mouth and dry eyes, constipation, or blurred vision. If one drug you try doesn't work, your health care provider may ask you to take different amounts, give you a different one to try, or have you try two types together. Lifestyle changes and medicine at the same time help many people.    Bladder Botox Treatment  If lifestyle changes and medicine aren?t working, injections may be offered. A trained urologist for men and women, or a male pelvic medicine & reconstructive surgeon ([facility]) can help with this. They may offer Bladder Botox Treatment.    Botox works for the bladder by relaxing the muscle of the bladder wall to reduce urinary urgency and urge incontinence. It can help the bladder muscles from squeezing too much. To put botulinum toxin into the bladder, your doctor will use a cystoscope passed into the bladder so the doctor can see inside the bladder.  Then, the doctor will inject tiny amounts of botulinum toxin into the bladder muscle. This procedure is performed in the office with local anesthesia. The effects of Botox last up to six months. Repeat treatments will be necessary when OAB symptoms return.    Your health care provider will want to know if the botulinum toxin treatment works for you. They will check to see if you get relief, or if you aren?t holding in too much urine. If urine is not releasing well, you may need to use a catheter temporarily.    Nerve Stimulation  Another treatment for people who need extra help is nerve stimulation, also called neuromodulation therapy. This type of treatment sends electrical pulses to nerves that share the same path for the bladder. In OAB, the nerve signals between your bladder and brain do not communicate correctly. These electrical pulses help the brain and the nerves to the bladder communicate so the bladder can function properly and improve OAB symptoms.    There are two types:  Percutaneous tibial nerve stimulation (PTNS). PTNS (peripheral)is a way to correct the nerves in your bladder. PTNS is done by placing a small electrode in your lower leg near your ankle. It sends pulses to the tibial nerve. The tibial nerve runs along your knee to nerves in your lower back. The pulses help control the signals that aren?t working right. Often, patients receive 12 treatments, depending on how it?s working.        Sacral neuromodulation (SNS). SNS (central) changes how the sacral nerve works. This nerve carries signals between the spinal cord and the bladder. Its job is to help hold and release urine. In OAB, these nerve signals aren?t doing what they should. SNS uses a bladder pacemaker to control these signals to stop OAB symptoms. SNS is a two-step surgical process. The first step is to implant an electrical wire under the skin in your lower back. This wire is first connected to a handheld pacemaker to send pulses to the sacral nerve. You and your doctor will test whether or not this pacemaker can help you. If it helps, the second step is to implant a permanent pacemaker that can regulate the nerve rhythm.          Bladder Reconstruction/Urinary Diversion Surgery  Surgery is only used in very rare and serious cases. There are two types of surgery available. Augmentation cystoplasty enlarges the bladder. Urinary diversion re-routs the flow of urine. There are many risks to these surgeries, so it is offered only when no other option can help.    More Information  Providers and Specialists Who Treat OAB  Many types of health care providers can offer basic help for OAB. Here are the types of providers you may meet:    Urologist* are surgeons who evaluate and treat problems of the urinary tract. Most urologists are very experienced with incontinence. However, not all of them specialize in treating OAB. A patient should ask if their provider specializes in treating OAB.    Gynecologists are doctors who focus on women?s health. Most are knowledgeable about incontinence, but not all are trained to treat OAB.    Male Pelvic Medicine and Reconstructive Surgery [facility]) specialists are urologists or gynecologists who are trained as experts in male pelvic health. The public often refers to [facility] specialists as male urologists or urogynecologists.    Primary Care Practitioners are doctors who can diagnose and treat common health concerns. If a primary care provider is experienced with OAB,  they will tell you your options. Or, they may refer you to a specialist, especially if lifestyle changes haven?t helped.    Internist are general doctors who may or may not be primary care providers. They will often refer to a specialist.    Nurse Practitioners (NP) are highly trained nurses, able to treat many medical problems. Some NPs specialize in issues like OAB, or they will refer you to a specialist.    Physician Assistants (PA) are professionals licensed to practice medicine with a doctor?s oversight. NPs and PAs are often part of the health care team. Many can diagnose and treat non-surgically and can help with exercises and lifestyle changes. Some specialize in issues such as OAB.    Geriatricians are doctors who treat older patients, and many are able to evaluate and treat OAB. But, not all treat OAB.    Physical Therapists are licensed health professionals who provide physical therapy. If they have special training in pelvic floor disorders, they can help with exercises and lifestyle changes for OAB.    *Typically, specialists who treat OAB and incontinence include urologists and male pelvic medicine specialists. It helps to ask if your health care provider has direct training or experience with OAB.    Use our Find-a-Urologist tool to help find a urologist near you. Simply chose ?incontinence? as a specialty for urologists with training and experience in urine leaks and OAB.    Tips for a Successful Doctor's Visit  It?s normal to feel uncomfortable when talking about OAB symptoms. Who wants to talk about bathroom problems or incontinence? Still, knowing more about OAB is the best way to take control of the problem. A little planning will give you confidence. Here are some tips to help:    Be prepared. Before your appointment, help the health care provider learn what?s going on by gathering some information. Also, be ready to take notes about what you learn. It is helpful to bring:    A list of the prescription drugs, over-the-counter medicines, vitamins and herbs you take.  A list of your past and current illnesses or injuries.  Results from the Overactive Bladder Assessment Tool, to help you discuss your symptoms.  A way to take notes about treatments.  Bring a friend. Ask a close friend or relative to go with you to the doctor. An ?appointment buddy? can help remind you of things you may forget to ask, or remind you of things the health care provider said.    Bring up the topic. If your health care provider doesn?t ask about your OAB symptoms, then bring up the topic yourself. It may not be wise to wait until the end of your visit, so you can be sure you have time for questions. If a nurse meets with you first, tell the nurse about your symptoms.    Speak freely. Share everything you?re experiencing. Your health care provider has heard it all! It?s okay to tell them about your symptoms and how they impact your daily life.    Ask questions. A visit to your health care provider is the right time to ask questions. It is best to bring your list of questions with you so you don?t forget them. We offer some good questions to ask in each section of this guide to help you.    Talking with Your Health Care Provider  Questions to Ask the Doctor about OAB  Are my symptoms from OAB or from something else?  What tests will I   need to find out if I have OAB?  What could have caused my OAB?  Can I do anything to prevent OAB symptoms?    Questions to Ask the Doctor about Treatment  What would happen if I don?t treat my OAB?  What lifestyle changes should I make?  Are there any exercises I can do to help?  Do I need to see a physical therapist?  What treatment could help my OAB?  How soon after treatment will I feel better?  What are the good and bad things that I should know about these treatments?  What problems should I call you about after I start treatment?  What happens if the first treatment doesn?t help?  Will I need treatment for the rest of my life?  Can my OAB be managed?  What are my next steps?    Questions to Ask Yourself about Symptoms  Do my symptoms make me stop doing the things I enjoy, or prevent me from going to events?  Am I afraid to be too far from a bathroom?  Have my symptoms changed my relationships with friends or family?  Do my symptoms make it hard to get a good night?s sleep?

## 2022-10-19 ENCOUNTER — Encounter: Admit: 2022-10-19 | Discharge: 2022-10-19 | Payer: MEDICARE

## 2022-10-19 NOTE — Telephone Encounter
Patient called and left a VM stating that he rec'd a letter in the mail in regards to his procedure on 10/24/22. This RN confirmed, this procedure has been [place] and approved and he can disregard the letter. This RN confirmed with Tish in precert that Is approved and we can move forward.

## 2022-10-21 ENCOUNTER — Encounter: Admit: 2022-10-21 | Discharge: 2022-10-21 | Payer: MEDICARE

## 2022-10-21 NOTE — Telephone Encounter
Received incoming VM from patient inquiring about the pelvic floor therapy referral. He has not heard anything.   Referral had been sent to [facility].   Outbound call to pt. LVM. Provided call back number.

## 2022-10-22 ENCOUNTER — Encounter: Admit: 2022-10-22 | Discharge: 2022-10-22 | Payer: MEDICARE

## 2022-10-22 DIAGNOSIS — I509 Heart failure, unspecified: Secondary | ICD-10-CM

## 2022-10-22 DIAGNOSIS — H269 Unspecified cataract: Secondary | ICD-10-CM

## 2022-10-22 DIAGNOSIS — I10 Essential (primary) hypertension: Secondary | ICD-10-CM

## 2022-10-22 DIAGNOSIS — J302 Other seasonal allergic rhinitis: Secondary | ICD-10-CM

## 2022-10-22 DIAGNOSIS — IMO0002 Ulcer: Secondary | ICD-10-CM

## 2022-10-22 DIAGNOSIS — M5136 Other intervertebral disc degeneration, lumbar region: Secondary | ICD-10-CM

## 2022-10-22 DIAGNOSIS — R053 Chronic cough: Secondary | ICD-10-CM

## 2022-10-22 DIAGNOSIS — I2510 Atherosclerotic heart disease of native coronary artery without angina pectoris: Secondary | ICD-10-CM

## 2022-10-22 DIAGNOSIS — G5600 Carpal tunnel syndrome, unspecified upper limb: Secondary | ICD-10-CM

## 2022-10-22 DIAGNOSIS — E669 Obesity, unspecified: Secondary | ICD-10-CM

## 2022-10-22 DIAGNOSIS — N529 Male erectile dysfunction, unspecified: Secondary | ICD-10-CM

## 2022-10-22 DIAGNOSIS — Z95810 Presence of automatic (implantable) cardiac defibrillator: Secondary | ICD-10-CM

## 2022-10-22 DIAGNOSIS — G4733 Obstructive sleep apnea (adult) (pediatric): Secondary | ICD-10-CM

## 2022-10-22 DIAGNOSIS — K5900 Constipation, unspecified: Secondary | ICD-10-CM

## 2022-10-22 DIAGNOSIS — K219 Gastro-esophageal reflux disease without esophagitis: Secondary | ICD-10-CM

## 2022-10-22 DIAGNOSIS — I219 Acute myocardial infarction, unspecified: Secondary | ICD-10-CM

## 2022-10-22 DIAGNOSIS — I255 Ischemic cardiomyopathy: Secondary | ICD-10-CM

## 2022-10-22 DIAGNOSIS — I340 Nonrheumatic mitral (valve) insufficiency: Secondary | ICD-10-CM

## 2022-10-22 DIAGNOSIS — H9190 Unspecified hearing loss, unspecified ear: Secondary | ICD-10-CM

## 2022-10-22 DIAGNOSIS — M4800 Spinal stenosis, site unspecified: Secondary | ICD-10-CM

## 2022-10-22 DIAGNOSIS — D699 Hemorrhagic condition, unspecified: Secondary | ICD-10-CM

## 2022-10-22 DIAGNOSIS — J449 Chronic obstructive pulmonary disease, unspecified: Secondary | ICD-10-CM

## 2022-10-22 DIAGNOSIS — R7303 Prediabetes: Secondary | ICD-10-CM

## 2022-10-22 DIAGNOSIS — T148XXA Other injury of unspecified body region, initial encounter: Secondary | ICD-10-CM

## 2022-10-22 DIAGNOSIS — M549 Dorsalgia, unspecified: Secondary | ICD-10-CM

## 2022-10-22 DIAGNOSIS — M2550 Pain in unspecified joint: Secondary | ICD-10-CM

## 2022-10-22 DIAGNOSIS — M1990 Unspecified osteoarthritis, unspecified site: Secondary | ICD-10-CM

## 2022-10-22 DIAGNOSIS — E785 Hyperlipidemia, unspecified: Secondary | ICD-10-CM

## 2022-10-22 DIAGNOSIS — I4891 Unspecified atrial fibrillation: Secondary | ICD-10-CM

## 2022-10-22 DIAGNOSIS — K449 Diaphragmatic hernia without obstruction or gangrene: Secondary | ICD-10-CM

## 2022-10-24 ENCOUNTER — Ambulatory Visit: Admit: 2022-10-24 | Discharge: 2022-10-24 | Payer: MEDICARE

## 2022-10-24 ENCOUNTER — Encounter: Admit: 2022-10-24 | Discharge: 2022-10-24 | Payer: MEDICARE

## 2022-10-24 DIAGNOSIS — M47817 Spondylosis without myelopathy or radiculopathy, lumbosacral region: Secondary | ICD-10-CM

## 2022-10-24 MED ORDER — DEXAMETHASONE SODIUM PHOS (PF) 10 MG/ML IJ EPIDURAL SOLN
10 mg | Freq: Once | EPIDURAL | 0 refills | Status: CP
Start: 2022-10-24 — End: ?
  Administered 2022-10-24: 20:00:00 10 mg via EPIDURAL

## 2022-10-24 MED ORDER — MIDAZOLAM 1 MG/ML IJ SOLN
.5-1 mg | INTRAVENOUS | 0 refills | Status: DC | PRN
Start: 2022-10-24 — End: 2022-10-24
  Administered 2022-10-24: 20:00:00 1 mg via INTRAVENOUS

## 2022-10-24 MED ORDER — LIDOCAINE (PF) 20 MG/ML (2 %) IJ SOLN
15 mL | Freq: Once | INTRAMUSCULAR | 0 refills | Status: CP
Start: 2022-10-24 — End: ?
  Administered 2022-10-24: 20:00:00 300 mg via INTRAMUSCULAR

## 2022-10-24 MED ORDER — FENTANYL CITRATE (PF) 50 MCG/ML IJ SOLN
25-50 ug | INTRAVENOUS | 0 refills | Status: DC | PRN
Start: 2022-10-24 — End: 2022-10-24
  Administered 2022-10-24: 20:00:00 50 ug via INTRAVENOUS

## 2022-10-24 NOTE — Procedures
Attending Surgeon: Evelina Bucy, MD    Anesthesia: Local    Pre-Procedure Diagnosis:   1. Spondylosis of lumbosacral region without myelopathy or radiculopathy        Post-Procedure Diagnosis:   1. Spondylosis of lumbosacral region without myelopathy or radiculopathy        Pain Score: Six    Dest Lumb/Sacr    Laterality: bilateral    Location: Lumbar/Sacral - L3, L4 and L5      Consent:   Consent obtained: written  Consent given by: patient  Risks discussed: allergic reaction, bleeding, bruising, infection, nerve damage, no change or worsening in pain, reaction to medication, seizure, sensation loss, skin burn, swelling and weakness  Alternatives discussed: alternative treatment, delayed treatment, no treatment and referral  Discussed with patient the purpose of the treatment/procedure, other ways of treating my condition, including no treatment/ procedure and the risks and benefits of the alternatives. Patient has decided to proceed with treatment/procedure.        Universal Protocol:  Relevant documents: relevant documents present and verified  Test results: test results available and properly labeled  Imaging studies: imaging studies available  Required items: required blood products, implants, devices, and special equipment available  Site marked: the operative site was marked  Patient identity confirmed: Patient identify confirmed verbally with patient.        Time out: Immediately prior to procedure a time out was called to verify the correct patient, procedure, equipment, support staff and site/side marked as required      Procedures Details:   Indications: pain     Prep: chlorhexidine  Local anesthetic:  1% lidocaine - 1mL  Sedation: anxiolysisPatient position: prone  Estimated Blood Loss: minimal  Specimens: none  Number of Levels Ablated: 2  Guidance: fluoroscopy  Needle size: 18 G  Active Needle Tip Length: 10mm  Neurolytic Technique: Radiofrequency Ablation      Patient tolerance: Patient tolerated the procedure well with no immediate complications. Pressure was applied, and hemostasis was accomplished.  Comments:   LUMBAR MEDIAL BRANCH RADIOFREQUENCY ABLATION UNDER FLUOROSCOPY    PROCEDURE:  1) bilateral L3-5 medial branch radiofrequency ablation targeting the L4-5 and L5-S1 joints.   2) Fluoroscopic needle guidance    REASON FOR PROCEDURE: Spondylosis without myleopathy or radiculopathy, facet arthropathy, DDD (lumbosacral)    PHYSICIAN: Evelina Bucy, MD    MEDICATIONS INJECTED: Before the ablation, 1 mL of 2% lidocaine with 1.66mg  dexamethasone was injected at each level.     LOCAL ANESTHETIC INJECTED: 2 mL of 1% lidocaine per site    SEDATION MEDICATIONS: As per nurse charting (if applicable and administered)    ESTIMATED BLOOD LOSS: None    SPECIMENS REMOVED: None    COMPLICATIONS: None    TECHNIQUE: Time-out was taken to identify the correct patient, procedure and side prior to starting the procedure. Lying in a prone position, the patient was prepped and draped in the usual sterile fashion using DuraPrep and a fenestrated drape. The levels were determined under fluoroscopy. Local anesthetic was given by raising a skin wheal and going down to the hub of a 27-gauge 1.25-inch needle. A 10mm tip radiofrequency needle was introduced to the anatomic location of the medial branch at the junction of the superior articular process and transverse process utilizing intermittent fluoroscopy. Motor stimulation up to 2 volts was done to confirm no ablation of the ventral ramus at each level. 1 mL of the solution above was then injected slowly at each level. After waiting 60 seconds,  ablation was performed utilizing a radiofrequency generator at 80 degrees C for 90 seconds.     The procedure was completed without complications and was tolerated well. The patient was monitored after the procedure. The patient (or responsible party) was given post-procedure and discharge instructions to follow at home. The patient was discharged in stable condition.     Evelina Bucy, MD          Radiofrequency time 90  Radiofrequency Temperature 80    Administrations This Visit       dexamethasone PF (DECADRON) epidural injection 10 mg       Admin Date  10/24/2022 Action  Given Dose  10 mg Route  Epidural Administered By  Evelina Bucy, MD              fentaNYL citrate PF (SUBLIMAZE) injection 25-50 mcg       Admin Date  10/24/2022 Action  Given Dose  50 mcg Route  Intravenous Administered By  Kendall Flack, RN              lidocaine (PF) 20 mg/mL (2 %) injection 300 mg       Admin Date  10/24/2022 Action  Given Dose  300 mg Route  Injection Administered By  Evelina Bucy, MD              midazolam (VERSED) injection 0.5-1 mg       Admin Date  10/24/2022 Action  Given Dose  1 mg Route  Intravenous Administered By  Kendall Flack, RN                  Estimated blood loss: none or minimal  Specimens: none  Patient tolerated the procedure well with no immediate complications. Pressure was applied, and hemostasis was accomplished.

## 2022-10-24 NOTE — Progress Notes

## 2022-10-24 NOTE — Progress Notes
SPINE CENTER  INTERVENTIONAL PAIN PROCEDURE HISTORY AND PHYSICAL    Chief Complaint: Pain    HISTORY OF PRESENT ILLNESS:  LBP, axial. At least 50% relief for at least 6 months with prior RFA.     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    Heart attack (HCC) possible in 2006    Hiatal hernia     Hyperlipemia 02/11/2009    Hypertension 02/11/2009    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    Joint pain 1966    Mitral regurgitation     Myocardial infarction (HCC) 2006    Obesity, Class I, 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)     OSA on CPAP 02/11/2009    Osteoarthritis     Pre-diabetes     S/P ICD (internal cardiac defibrillator) procedure 02/11/2009    Seasonal allergies     Spinal stenosis 2010    Ulcer        Surgical History:   Procedure Laterality Date    HUMERUS FRACTURE SURGERY Left 2004    MITRAL VALVULOPLASTY  2006    same time as bypass    CORONARY ARTERY BYPASS GRAFT  04/14/2005    CABGx5 LIMA-LAD, L Rad-Dx, sSVG-RCA-RCA, sSVG-OM & MV Plasty for MR:    RHYTHM DEVICE PLACEMENT  2007    defibrillator    FOOT SURGERY Bilateral 2012    Tarsal tunnel release w/ bunionectomy and hammer toe     EAR SURGERY Bilateral 2012    Ear Tubes    SINUS SURGERY  10/2011    CARDIOVASCULAR STRESS TEST  09/2013    KNEE REPLACEMENT Right 05/2014    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    Performed by Simonne Maffucci, MD at [facility] OR    TRANSVENOUS REMOVAL IMPLANTABLE DEFIBRILLATOR RIGHT VENTRICULAR LEAD Left 03/13/2018    Performed by Kathreen Cornfield, MD at [facility] CVOR    INSERTION RIGHT VENTRICULAR LEAD Left 03/13/2018    Performed by Kathreen Cornfield, MD at [facility] CVOR    FLUOROSCOPY - CARDIAC  03/13/2018    Performed by Kathreen Cornfield, MD at [facility] CVOR    INSERTION/ REPLACEMENT TEMPORARY PACEMAKER LEAD/ CATHETER  03/13/2018    Performed by Kathreen Cornfield, MD at [facility] CVOR    REMOVAL AND REPLACEMENT IMPLANTABLE DEFIBRILLATOR GENERATOR - DUAL LEAD SYSTEM  03/13/2018    Performed by Kathreen Cornfield, MD at [facility] CVOR    CARDIOTHORACIC SURGERY STANDBY N/A 03/13/2018    Performed by Cath, Physician at [facility] CVOR    INTRACARDIAC CATHETER ABLATION WITH COMPREHENSIVE ELECTROPHYSIOLOGIC EVALUATION - ATRIAL FIBRILLATION, RADIOFREQUENCY N/A 11/22/2018    Performed by Kathreen Cornfield, MD at [facility] EP LAB    INTRACARDIAC  ELECTROPHYSIOLOGIC 3-DIMENSIONAL MAPPING N/A 11/22/2018    Performed by Kathreen Cornfield, MD at [facility] EP LAB    INTRACARDIAC ECHOCARDIOGRAPHY N/A 11/22/2018    Performed by Kathreen Cornfield, MD at [facility] EP LAB    POSSIBLE INTRAVENOUS DRUG INFUSION FOR STIMULATION AND PACING N/A 11/22/2018    Performed by Kathreen Cornfield, MD at [facility] EP LAB    POSSIBLE INSERTION ESOPHAGEAL DEVIATOR N/A 11/22/2018    Performed by Kathreen Cornfield, MD at [facility] EP LAB    POSSIBLE EXTERNAL CARDIOVERSION N/A 11/22/2018    Performed by Kathreen Cornfield, MD at [facility] EP LAB    INTRACARDIAC CATHETER ABLATION WITH COMPREHENSIVE ELECTROPHYSIOLOGIC EVALUATION - ATYPICAL FLUTTER Right 11/22/2018    Performed by Kathreen Cornfield, MD at [facility] EP LAB    TRANSESOPHAGEAL ECHOCARDIOGRAM DURING INTERVENTION N/A 11/22/2018    Performed by Cath, Physician at [facility] EP LAB    INSERTION SPINAL NEUROSTIMULATOR PULSE GENERATOR/ RECEIVER N/A 11/17/2021    Performed by Evelina Bucy, MD at [facility] OR    PERCUTANEOUS IMPLANTATION EPIDURAL NEUROSTIMULATOR ELECTRODE ARRAY x2 N/A 11/17/2021    Performed by Evelina Bucy, MD at [facility] OR    ELECTRONIC ANALYSIS IMPLANTED NEUROSTIMULATOR PULSE GENERATOR SYSTEM - COMPLEX SPINAL CORD/ PERIPHERAL NEUROSTIMULATOR PULSE GENERATOR/ TRANSMITTER WITH INTRA OPERATIVE/ SUBSEQUENT PROGRAMING N/A 11/17/2021    Performed by Evelina Bucy, MD at [facility] OR    SIGMOIDOSCOPY DIAGNOSTIC WITH COLLECTION SPECIMEN BY BRUSHING/ WASHING - FLEXIBLE N/A 09/30/2022    Performed by Jolee Ewing, MD at [facility] ENDO    ESOPHAGOGASTRODUODENOSCOPY WITH SPECIMEN COLLECTION BY BRUSHING/ WASHING N/A 09/30/2022    Performed by Jolee Ewing, MD at [facility] ENDO    CARDIAC DEFIBRILLATOR PLACEMENT  11/2013, 02-2018    S/P Fidelis ICD Lead Extraction and New Lead Implantation    HX BACK SURGERY  2013, 2006    Lumbart Laminectomy and Fusion    HX CARPAL TUNNEL RELEASE Right 1990s    HX CORONARY ARTERY BYPASS GRAFT  2006    HX ENDOSCOPY  2019    HX HEART CATHETERIZATION      HX HEMORRHOIDECTOMY  2021, 2022    KNEE SURGERY  2015, 2016, 2020    UMBILICAL ARTERIAL CATH - BEDSIDE  2006    UPPER GASTROINTESTINAL ENDOSCOPY         family history includes Cancer in his father, mother, and sister; Heart Disease in his father; Heart problem in his father; Hypertension in his mother; Joint Pain in his mother.    Social History     Socioeconomic History    Marital status: Married   Tobacco Use    Smoking status: Never    Smokeless tobacco: Never   Vaping Use    Vaping Use: Never used   Substance and Sexual Activity    Alcohol use: Not Currently     Comment: very rarely 4 drinks per year    Drug use: No    Sexual activity: Yes     Partners: Female     Birth control/protection: None       Allergies   Allergen Reactions    Clarithromycin RASH     Allergy recorded in SMS: Biaxin~Reactions: RASH~THRUSH    Ketoconazole SEE COMMENTS     Had bloodshot eyes after use, and sore.    Lisinopril COUGH    Oxycodone UNKNOWN     Nightmare       Vitals:    10/24/22 1250   BP: 119/72   BP Source: Arm, Right  Upper   Pulse: 85   Temp: 36.8 ?C (98.3 ?F)   Resp: 16 SpO2: 98%   TempSrc: Oral   PainSc: Six   Weight: 99.3 kg (219 lb)   Height: 182.9 cm (6')     Pain Score: Six  Oswestry Total Score:: 24    REVIEW OF SYSTEMS: 10 point ROS obtained and negative      PHYSICAL EXAM:    General: Alert, cooperative, no acute distress.  HEENT: Normocephalic, atraumatic.  Neck: Supple.  Lungs: Unlabored respirations, bilateral and equal chest excursion.  Heart: Regular rate.  Skin: Warm and dry to touch.  Abdomen: Nondistended.  MSK: No deformity.  Neurological: Alert and oriented x3.        IMPRESSION:    1. Spondylosis of lumbosacral region without myelopathy or radiculopathy         PLAN: Other Bilateral L4-S1 RFA    Radiofrequency Ablation Checklist:    If repeat radiofrequency ablation:  50% improvement for at least 6 months [x]     50% consistent relief for at least 6 months [x]     Radiofrequency ablation completed on same level over 2 years ago  No    Oswestry:  Oswestry:  Oswestry Total Score:: 24      General Pre Procedural Sedation ASA Classification    I have discussed risks and alternatives of this type of sedation and procedure with the patient (and other designee if appropriate).    Patient has been NPO for 6 hours as per protocol.     Pregnancy Status: Not applicable    Prior Anesthetic Types: Anxiolysis    Airway: Airway assessment performed II (soft palate, uvula, fauces visible)    Head and Neck: No abnormalities noted    Mouth: No abnormalities noted    Medications for Procedural Sedation: Versed and/or Fentanyl    Anesthesia Classification:  ASA III (A patient with a severe systemic disease that limits activity, but is not incapacitating)    Patient remains a candidate for procedure: Yes    The intention for the procedure today is Anxiolysis.

## 2022-10-24 NOTE — Progress Notes
Sedation physician present in room. Recent vitals and patient condition reviewed between sedation physician and nurse. Reassessment completed. Determination made to proceed with planned sedation.

## 2022-10-25 ENCOUNTER — Encounter: Admit: 2022-10-25 | Discharge: 2022-10-25 | Payer: MEDICARE

## 2022-10-25 MED ORDER — ROSUVASTATIN 40 MG PO TAB
ORAL_TABLET | 2 refills
Start: 2022-10-25 — End: ?

## 2022-10-25 MED ORDER — EZETIMIBE 10 MG PO TAB
ORAL_TABLET | 1 refills
Start: 2022-10-25 — End: ?

## 2022-10-25 MED ORDER — GABAPENTIN 300 MG PO CAP
300 mg | ORAL_CAPSULE | Freq: Three times a day (TID) | ORAL | 2 refills
Start: 2022-10-25 — End: ?

## 2022-10-25 NOTE — Telephone Encounter
Received incoming VM from [facility] with [facility] [phone]. They are unable to provide the services for pelvic floor therapy as was referred over for.   Mychart message sent to pt.

## 2022-11-01 ENCOUNTER — Encounter: Admit: 2022-11-01 | Discharge: 2022-11-01 | Payer: MEDICARE

## 2022-11-01 MED ORDER — GABAPENTIN 300 MG PO CAP
300 mg | ORAL_CAPSULE | Freq: Three times a day (TID) | ORAL | 2 refills
Start: 2022-11-01 — End: ?

## 2022-11-07 ENCOUNTER — Encounter: Admit: 2022-11-07 | Discharge: 2022-11-07 | Payer: MEDICARE

## 2022-11-07 MED ORDER — GABAPENTIN 300 MG PO CAP
300 mg | ORAL_CAPSULE | Freq: Three times a day (TID) | ORAL | 2 refills
Start: 2022-11-07 — End: ?

## 2022-11-09 ENCOUNTER — Encounter: Admit: 2022-11-09 | Discharge: 2022-11-09 | Payer: MEDICARE

## 2022-11-18 ENCOUNTER — Encounter: Admit: 2022-11-18 | Discharge: 2022-11-18 | Payer: MEDICARE

## 2022-11-22 ENCOUNTER — Encounter: Admit: 2022-11-22 | Discharge: 2022-11-22 | Payer: MEDICARE

## 2022-11-23 ENCOUNTER — Encounter: Admit: 2022-11-23 | Discharge: 2022-11-23 | Payer: MEDICARE

## 2022-11-23 MED FILL — FESOTERODINE 4 MG PO TB24: 4 mg | ORAL | 30 days supply | Qty: 30 | Fill #2 | Status: CP

## 2022-11-24 ENCOUNTER — Encounter: Admit: 2022-11-24 | Discharge: 2022-11-24 | Payer: MEDICARE

## 2022-11-28 ENCOUNTER — Encounter: Admit: 2022-11-28 | Discharge: 2022-11-28 | Payer: MEDICARE

## 2022-11-28 NOTE — Telephone Encounter
Received incoming fax from [facility] regarding PT note from 11/22/22. Reviewed and signed by Dr. Glennon Mac. Successfully faxed back to [phone]. Document placed to be scanned into the chart.

## 2022-11-29 ENCOUNTER — Encounter: Admit: 2022-11-29 | Discharge: 2022-11-29 | Payer: MEDICARE

## 2022-11-29 MED ORDER — FUROSEMIDE 40 MG PO TAB
ORAL_TABLET | ORAL | 3 refills | 90.00000 days | Status: AC
Start: 2022-11-29 — End: ?

## 2022-11-29 MED ORDER — SPIRONOLACTONE 25 MG PO TAB
25 mg | ORAL_TABLET | Freq: Every day | ORAL | 3 refills | 90.00000 days | Status: AC
Start: 2022-11-29 — End: ?

## 2022-11-29 MED ORDER — POTASSIUM CHLORIDE 20 MEQ PO TBTQ
ORAL_TABLET | 3 refills | 30.00000 days | Status: AC
Start: 2022-11-29 — End: ?

## 2022-11-29 MED ORDER — METOPROLOL SUCCINATE 100 MG PO TB24
ORAL_TABLET | ORAL | 3 refills | 90.00000 days | Status: AC
Start: 2022-11-29 — End: ?

## 2022-12-06 ENCOUNTER — Encounter: Admit: 2022-12-06 | Discharge: 2022-12-06 | Payer: MEDICARE

## 2022-12-13 ENCOUNTER — Encounter: Admit: 2022-12-13 | Discharge: 2022-12-13 | Payer: MEDICARE

## 2022-12-13 ENCOUNTER — Ambulatory Visit: Admit: 2022-12-13 | Discharge: 2022-12-14 | Payer: MEDICARE

## 2022-12-13 DIAGNOSIS — H9190 Unspecified hearing loss, unspecified ear: Secondary | ICD-10-CM

## 2022-12-13 DIAGNOSIS — I509 Heart failure, unspecified: Secondary | ICD-10-CM

## 2022-12-13 DIAGNOSIS — R053 Chronic cough: Secondary | ICD-10-CM

## 2022-12-13 DIAGNOSIS — R7303 Prediabetes: Secondary | ICD-10-CM

## 2022-12-13 DIAGNOSIS — M549 Dorsalgia, unspecified: Secondary | ICD-10-CM

## 2022-12-13 DIAGNOSIS — J302 Other seasonal allergic rhinitis: Secondary | ICD-10-CM

## 2022-12-13 DIAGNOSIS — M4800 Spinal stenosis, site unspecified: Secondary | ICD-10-CM

## 2022-12-13 DIAGNOSIS — G5600 Carpal tunnel syndrome, unspecified upper limb: Secondary | ICD-10-CM

## 2022-12-13 DIAGNOSIS — M2550 Pain in unspecified joint: Secondary | ICD-10-CM

## 2022-12-13 DIAGNOSIS — I340 Nonrheumatic mitral (valve) insufficiency: Secondary | ICD-10-CM

## 2022-12-13 DIAGNOSIS — Z95810 Presence of automatic (implantable) cardiac defibrillator: Secondary | ICD-10-CM

## 2022-12-13 DIAGNOSIS — K219 Gastro-esophageal reflux disease without esophagitis: Secondary | ICD-10-CM

## 2022-12-13 DIAGNOSIS — J449 Chronic obstructive pulmonary disease, unspecified: Secondary | ICD-10-CM

## 2022-12-13 DIAGNOSIS — K5900 Constipation, unspecified: Secondary | ICD-10-CM

## 2022-12-13 DIAGNOSIS — M5136 Other intervertebral disc degeneration, lumbar region: Secondary | ICD-10-CM

## 2022-12-13 DIAGNOSIS — I255 Ischemic cardiomyopathy: Secondary | ICD-10-CM

## 2022-12-13 DIAGNOSIS — E669 Obesity, unspecified: Secondary | ICD-10-CM

## 2022-12-13 DIAGNOSIS — M5417 Radiculopathy, lumbosacral region: Secondary | ICD-10-CM

## 2022-12-13 DIAGNOSIS — M1990 Unspecified osteoarthritis, unspecified site: Secondary | ICD-10-CM

## 2022-12-13 DIAGNOSIS — N529 Male erectile dysfunction, unspecified: Secondary | ICD-10-CM

## 2022-12-13 DIAGNOSIS — E785 Hyperlipidemia, unspecified: Secondary | ICD-10-CM

## 2022-12-13 DIAGNOSIS — IMO0002 Ulcer: Secondary | ICD-10-CM

## 2022-12-13 DIAGNOSIS — I10 Essential (primary) hypertension: Secondary | ICD-10-CM

## 2022-12-13 DIAGNOSIS — D699 Hemorrhagic condition, unspecified: Secondary | ICD-10-CM

## 2022-12-13 DIAGNOSIS — K449 Diaphragmatic hernia without obstruction or gangrene: Secondary | ICD-10-CM

## 2022-12-13 DIAGNOSIS — I219 Acute myocardial infarction, unspecified: Secondary | ICD-10-CM

## 2022-12-13 DIAGNOSIS — T148XXA Other injury of unspecified body region, initial encounter: Secondary | ICD-10-CM

## 2022-12-13 DIAGNOSIS — I4891 Unspecified atrial fibrillation: Secondary | ICD-10-CM

## 2022-12-13 DIAGNOSIS — I2510 Atherosclerotic heart disease of native coronary artery without angina pectoris: Secondary | ICD-10-CM

## 2022-12-13 DIAGNOSIS — G4733 Obstructive sleep apnea (adult) (pediatric): Secondary | ICD-10-CM

## 2022-12-13 DIAGNOSIS — H269 Unspecified cataract: Secondary | ICD-10-CM

## 2022-12-13 NOTE — Progress Notes
[facility]  Subjective     Chief Complaint:   Chief Complaint   Patient presents with    Lower Back - Pain    Left Hip - Pain    Right Hip - Pain    Right Knee - Pain       HPI: Johnny [name] is a 77 y.o. 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 who presents for evaluation.     Patient presents to clinic for follow-up of LBP/knee pain.      S/P lumbar RFA on 10/24/2022 - minimal relief      S/P SCS placement on 11/17/2021  He is reporting charging difficulty - taking only 5 minutes to charge  Reports 50% relief with SCS  Radiating pain in LLE has improved by 90%  Lower back pain is better in the am, but worsens by evening  Right knee pain continues to have pain      Patient endorses achiness/heaviness of legs with walking a long distance that improves with rest. Positive Shopping Cart sign.     Renne Musca Whiteside denies any recent fevers, chills, infection, antibiotics, bowel or bladder incontinence, saddle anesthesia. +Xarelto for a. fib.  Recent diagnosis of parkinsons    Taking tizanidine, reports mod. pain relief.. Has decreased due to dizziness.        PRIOR MEDICATIONS:   Effective  Acetaminophen (little)  Tizanidine    Ineffective  Gabapentin    Unable to tolerate  NSAID    Never  Lyrica  Ami/Nortriptyline  Cymbalta      PRIOR INTERVENTIONS:  L-spine surgery with hardware L2-3 L3-4 and then later L4-5 and L5-1 next (2 surgeries, most recent in 2008) Right TKA with revision  Effective  Bilateral SIJ (OSH, good benefit)  Left L5-S2 TFESI  right knee RFA - genicular  Lumbar RFA      Ineffective  ESI x3 (OSH)      Past Medical History:  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    Heart attack (HCC) possible in 2006    Hiatal hernia  Hyperlipemia 02/11/2009    Hypertension 02/11/2009    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    Joint pain 1966    Mitral regurgitation     Myocardial infarction [facility]) 2006    Obesity, Class I, 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)     OSA on CPAP 02/11/2009    Osteoarthritis     Pre-diabetes     S/P ICD (internal cardiac defibrillator) procedure 02/11/2009    Seasonal allergies     Spinal stenosis 2010    Ulcer        Family History:  Family History   Problem Relation Age of Onset    Cancer Father     Heart problem Father     Heart Disease Father     Hypertension Mother     Joint Pain Mother     Cancer Mother Cancer Sister        Social History:  Lives in [place] [address] (1.25 hours away)    Social History     Socioeconomic History    Marital status: Married   Tobacco Use    Smoking status: Never    Smokeless tobacco: Never   Vaping Use    Vaping status: Never Used   Substance and Sexual Activity    Alcohol use: Not Currently     Comment: very rarely 4 drinks per year    Drug use: No    Sexual activity: Yes     Partners: Female     Birth control/protection: None       Allergies:  Allergies   Allergen Reactions    Clarithromycin RASH     Allergy recorded in SMS: Biaxin~Reactions: RASH~THRUSH    Ketoconazole SEE COMMENTS     Had bloodshot eyes after use, and sore.    Lisinopril COUGH    Oxycodone UNKNOWN     Nightmare       Medications:    Current Outpatient Medications:     acetaminophen (TYLENOL) 325 mg tablet, Take two tablets by mouth every 4 hours as needed for Pain., Disp: 300 tablet, Rfl: 1    albuterol (VENTOLIN HFA, PROAIR HFA) 90 mcg/actuation inhaler, Inhale two puffs by mouth into the lungs four times daily as needed for Wheezing., Disp: , Rfl:     aspirin 81 mg chewable tablet, Take 1 Tab by mouth daily. WAIT to restart until finished taking lovenox injections. (Patient taking differently: Chew one tablet by mouth at bedtime daily.), Disp: 90 Tab, Rfl: 3    carbidopa/levodopa (SINEMET) 25/100 mg tablet, Take 2 tabs in the AM, 2 tabs in the afternoon, and 1 tab in the evening, Disp: 450 tablet, Rfl: 3    cetirizine (ZYRTEC) 10 mg tablet, Take one tablet by mouth daily., Disp: , Rfl:     cholecalciferol (Vitamin D3) (VITAMIN D-3) 1,000 units tablet, Take one tablet by mouth twice daily.  , Disp: , Rfl:     dextromethorphan/guaiFENesin (MUCINEX DM) 30/600 mg Tb12, Take two tablets by mouth daily as needed., Disp: , Rfl:     docusate (COLACE) 100 mg capsule, Take two capsules by mouth at bedtime daily. Pt is taking 550 mg daily, Disp: , Rfl:     ezetimibe (ZETIA) 10 mg tablet, TAKE 1 TABLET BY MOUTH AT BEDTIME, Disp: 90 tablet, Rfl: 1    famotidine (PEPCID) 20 mg tablet, Take one tablet by mouth at bedtime daily. Indications: gastroesophageal reflux disease, Disp: 90 tablet, Rfl: 1  fesoterodine ER (TOVIAZ) 4 mg tablet, Take one tablet to two tablets by mouth every 48 hours., Disp: 30 tablet, Rfl: 11    fexofenadine(+) (ALLEGRA) 180 mg tablet, Take one tablet by mouth daily., Disp: , Rfl:     fluticasone-umeclidin-vilanter (TRELEGY ELLIPTA) 100-62.5-25 mcg inhaler, Inhale 1 Dose by mouth into the lungs at bedtime daily., Disp: , Rfl:     furosemide (LASIX) 40 mg tablet, TAKE 2 TABLETS BY MOUTH IN DAILY TAKE 3 TABLETS ON MONDAY, WEDNEDSAY, FRIDAY AS DIRECTED BY CARDIOLOGY, Disp: 270 tablet, Rfl: 3    gabapentin (NEURONTIN) 300 mg capsule, Take one capsule by mouth three times daily., Disp: 270 capsule, Rfl: 1    glucosamine su 2KCl-chondroit 500-400 mg tab, Take 1 Tab by mouth twice daily., Disp: , Rfl:     ipratropium bromide (ATROVENT) 42 mcg (0.06 %) nasal spray, Apply two sprays to each nostril as directed twice daily as needed., Disp: , Rfl:     losartan (COZAAR) 50 mg tablet, TAKE 1 TABLET BY MOUTH DAILY, Disp: 90 tablet, Rfl: 2    metoprolol succinate XL (TOPROL XL) 100 mg extended release tablet, TAKE 1 TABLET BY MOUTH EVERY DAY, Disp: 90 tablet, Rfl: 3    mucus clearing device (AEROBIKA OSCILLATING PEP SYSTM MISC), Use  as directed. Use with salt water and inhale twice daily, Disp: , Rfl:     OMEGA-3 FATTY ACIDS-FISH OIL PO, Take 1 capsule by mouth twice daily., Disp: , Rfl:     omeprazole DR (PRILOSEC) 40 mg capsule, Take one capsule by mouth twice daily. Indications: gastroesophageal reflux disease, Disp: 180 capsule, Rfl: 1    other medication, mupirocin ointment and budesonide solution mixed with warm water Use as nasal irrigation twice daily, Disp: , Rfl:     polyethylene glycol 3350 (GLYCOLAX; MIRALAX) 17 gram/dose powder, Take 17 g by mouth daily. (Patient taking differently: Take seventeen g by mouth nightly as needed.), Disp: 595 g, Rfl: 3    potassium chloride SR (K-DUR) 20 mEq tablet, TAKE 2 TABLETS BY MOUTH ON MONDAY, WEDNESDAY AND FRIDAY AND 1 TABLET ALL OTHER DAYS, Disp: 140 tablet, Rfl: 3    predniSONE (DELTASONE) 10 mg tablet, Take one tablet by mouth daily., Disp: , Rfl:     pyRIDostigmine bromide (MESTINON) 60 mg tablet, Take one tablet by mouth three times daily. 1.5 tablets 3 times daily, Disp: , Rfl:     rivaroxaban (XARELTO) 20 mg tablet, Take one tablet by mouth daily., Disp: 30 tablet, Rfl: 11    rosuvastatin (CRESTOR) 40 mg tablet, TAKE 1 TABLET BY MOUTH AT BEDTIME, Disp: 90 tablet, Rfl: 2    spironolactone (ALDACTONE) 25 mg tablet, TAKE 1 TABLET BY MOUTH EVERY DAY WITH FOOD, Disp: 90 tablet, Rfl: 3    testosterone cypionate 200 mg/mL kit, Inject 1 mL into the muscle every 14 days., Disp: , Rfl:     tiZANidine (ZANAFLEX) 2 mg tablet, TAKE 1 TO 3 TABLETS BY MOUTH AT BEDTIME AS NEEDED, Disp: 90 tablet, Rfl: 1    traMADoL (ULTRAM) 50 mg tablet, Take one tablet by mouth every 8 hours as needed for Pain., Disp: 30 tablet, Rfl: 0    traZODone (DESYREL) 100 mg tablet, 1.5 tablets., Disp: , Rfl:     Vacuum Erection Device System kit, Use as directed for sexual activity., Disp: 1 kit, Rfl: 11    Physical examination:   BP 96/57  - Pulse 72  - Temp 36.4 ?C (97.5 ?F)  - Ht 180.3 cm (  5' 11)  - Wt 104.2 kg (229 lb 12.8 oz)  - SpO2 98%  - BMI 32.05 kg/m?   Pain Score: Seven    Physical Exam:  General: The patient is a well-developed, well nourished 77 y.o. male in no acute distress.   HEENT: Head is normocephalic and atraumatic. Pupils are equal and reactive to light bilaterally.   Cardiac: Based on palpation, pulse appears to be regular rate and rhythm.   Pulmonary: The patient has unlabored respirations and bilateral symmetric chest excursion.   Abdomen: Soft, nontender, and nondistended.   Extremities: No clubbing, cyanosis, or edema.     Neurologic:   The patient is alert and oriented times 3. Musculoskeletal:   Gait is antalgic - cane      Lumbar spine:  Appearance: No lesions or deformity  Lumbar tenderness: moderate tenderness, increased paraspinal muscle tone  SI joint tenderness: None  Pain with extension: Yes  Pain with lateral flexion: None  FABER positive?: None  Lower extremity strength: 5/5 bilaterally  Sensation to light touch: Intact and equal in the bilateral lower extremities  Straight leg raise positive?: Bilateral      MS:   Root Right Left   Hip Flexion L2 5 5   Knee Flexion L5/S1 5 5   Knee Extension L3 5 5   Dorsiflexion L4 5 5   Plantarflexion S1 5 5   EHL Extension L5 5 5       Results for orders placed during the hospital encounter of 08/24/20    MRI L-SPINE WO/W CONTRAST    Addendum 08/25/2020  5:39 PM  Finalized by Ivory Broad, M.D. on 08/24/2020 5:00 PM. Dictated by Delfin Gant, M.D. on 08/24/2020 3:54 PM.Addendum:  Delfin Gant discussed these findings via telephone with Dr. Francee Nodal nurse, Vassie Moment, at 8:34 AM on 08/25/2020.      Approved by Delfin Gant, M.D. on 08/25/2020 8:35 AM    By my electronic signature, I attest that I have personally reviewed the images for this examination and formulated the interpretations and opinions expressed in this report      Finalized by Ivory Broad, M.D. on 08/25/2020 5:36 PM. Dictated by Delfin Gant, M.D. on 08/25/2020 8:33 AM.    Narrative  EXAM: MRI L-SPINE    HISTORY:    , lumbar radiculapathy,    Technique: Multiple sagittal and axial MR sequences were obtained of the lumbar spine with and without MultiHance contrast.    Comparison: CT lumbar spine August 27, 2014    FINDINGS:    There are 5 nonrib-bearing lumbar type vertebral bodies. There is left convexity lumbar spinal curvature. Trace retrolisthesis at L2-L3 and trace anterolisthesis of L5-S1. X-Stop devices are seen at the spinous processes of the lumbar spine. Vertebral body heights are maintained. Multilevel vertebral endplate marrow changes. No suspicious bone marrow replacing lesion. The conus is normal in appearance and position at the T12-L1 level. abnormal enhancing lesion is identified. Bilateral renal atrophy. Small upper pole right renal cyst. There is an additional 1.3 cm upper pole right renal lesion which demonstrates T2 and T1 hypointensity (series 5, image 8).    T12-L1: No significant central spinal or neuroforaminal stenosis.    L1-L2: Marked disc degeneration, mild circumferential disc bulge, and facet hypertrophy. Mild central spinal stenosis. Moderate left and marked right neural foraminal stenosis.    L2-L3: Trace retrolisthesis, moderate disc degeneration, circumferential disc bulge, facet hypertrophy, and mild malignant flavum thickening. Mild central spinal stenosis. Moderate left and marked right  neuroforaminal stenosis.    L3-L4: Moderate disc degeneration, circumferential disc bulge, and facet hypertrophy. Moderate to marked spinal stenosis. Marked bilateral neuroforaminal stenosis.    L4-L5: Mild disc degeneration, circumferential disc bulge with superimposed central disc protrusion, ligamentum flavum thickening, and facet hypertrophy. Moderate to marked central spinal stenosis. Marked bilateral neural foraminal stenosis.    L5-S1: Mild disc degeneration, circumferential disc bulge, and facet hypertrophy. Moderate central spinal stenosis. Marked bilateral neural foraminal stenosis.    Impression  1.  Multilevel degenerative central spinal stenosis, greatest of moderate to marked degree at L3-L4, L4-L5, and L5-S1.  2.  Multilevel degenerative neuroforaminal stenosis, greatest of marked degree on the right at L1-L2 and L2-L3 as well as bilaterally at L3-L4, L4-5, and L5-S1.  3.  Left convexity lumbar spinal curvature.  4.  Trace retrolisthesis of L2-L3 and trace anterolisthesis of L5-S1.  5.  T2 and T1 hypointense peripheral right renal lesion. MRI abdomen (renal mass protocol) is recommended for further evaluation.    By my electronic signature, I attest that I have personally reviewed the images for this examination and formulated the interpretations and opinions expressed in this report          Last Cr and LFT's:  Creatinine   Date Value Ref Range Status   03/31/2021 1.30 (H) 0.4 - 1.24 MG/DL Final     AST (SGOT)   Date Value Ref Range Status   11/22/2018 25 7 - 40 U/L Final     ALT (SGPT)   Date Value Ref Range Status   11/22/2018 24 7 - 56 U/L Final     Alk Phosphatase   Date Value Ref Range Status   11/22/2018 51 25 - 110 U/L Final     Total Bilirubin   Date Value Ref Range Status   11/22/2018 0.8 0.3 - 1.2 MG/DL Final          Assessment:    Masanobu Colas is a 77 y.o. 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 who presents for evaluation of pain.    The pain complaints are most likely due to:    1. Lumbosacral radiculopathy  [place] AMB SPINE TRANSFORAMINAL LUMBAR/SACRAL    MRI L-SPINE WO CONTRAST    [place] AMB SPINE TRANSFORAMINAL LUMBAR/SACRAL            Patient has had an adequate trial of > 12 months of rest, exercise, multimodal treatment, and the passage of time without improvement of symptoms. The pain has significant impact on the daily quality of life.     Plan:    1.Discussed care options with patient. Plan on bilateral L4-L5 at next available appointment.  This patient's clinical history, exam, AND imaging support radiculopathy AND there is a significant impact on quality of life and function AND the pain has been present for at least  4 weeks AND they have failed to improve with noninvasive conservative care.     2. Update MRI L-spine  - consider referral to surgery  Failing conservative measures including formal PT, pharmacological agents and injections  3. Encouraged to continue at home PT program.  4. Follow up after injection        Risks/benefits of all pharmacologic and interventional treatments discussed and questions answered.

## 2022-12-14 ENCOUNTER — Encounter: Admit: 2022-12-14 | Discharge: 2022-12-14 | Payer: MEDICARE

## 2022-12-14 DIAGNOSIS — Z95810 Presence of automatic (implantable) cardiac defibrillator: Secondary | ICD-10-CM

## 2022-12-14 NOTE — Telephone Encounter
-----   Message from Matilde Bash, RN sent at 12/14/2022  4:27 PM CDT -----  Regarding: RE: MRI  Will do now. sorry.   ----- Message -----  From: Levonne Hubert  Sent: 12/14/2022   2:55 PM CDT  To: [facility]  Subject: RE: MRI                                          I still need orders please  ----- Message -----  From: Matilde Bash, RN  Sent: 12/14/2022   1:48 PM CDT  To: Margreta Journey Raley-Gordon; [facility]  Subject: RE: MRI                                          6/19 will work.  I will place order .  Put on Outpt list pls thx.    ----- Message -----  From: Levonne Hubert  Sent: 12/14/2022   1:00 PM CDT  To: [facility]  Subject: RE: MRI                                          6/19 1015 BELL  ----- Message -----  From: Matilde Bash, RN  Sent: 12/14/2022  11:56 AM CDT  To: Margreta Journey Raley-Gordon; [facility]  Subject: RE: MRI                                          1.5 & 3T Conditional, Smartsync. only needs date for documentation.  Needs updated checklist.  (last was not done as smartsync but cxr has already been reviewed.)   ----- Message -----  From: Levonne Hubert  Sent: 12/13/2022   3:37 PM CDT  To: [facility]  Subject: MRI                                              MRN: [id number]     Name: Johnny [name]      Exam: MRI L Spine WO Contrast      Ordering physician: Haze Justin     Expected date or time frame: Next available on a Tuesday or a Thursday      Paste device information below:     GENERATOR ATRIAL LEAD RV LEAD LV LEAD  Manufacturer: Medtronic            Model Number: Evera MRI [id number] [id number] 4076-52cm Sprint Quattro Secure S MRI [id number]   Serial Number: [id number]   Implant Date: [id number] 01/06/2006 03/13/2018   Device Type: ICD

## 2022-12-14 NOTE — Telephone Encounter
-----   Message from Matilde Bash, RN sent at 12/14/2022  1:48 PM CDT -----  Regarding: RE: MRI  6/19 will work.  I will place order .  Put on Outpt list pls thx.    ----- Message -----  From: Levonne Hubert  Sent: 12/14/2022   1:00 PM CDT  To: [facility]  Subject: RE: MRI                                          6/19 1015 BELL  ----- Message -----  From: Matilde Bash, RN  Sent: 12/14/2022  11:56 AM CDT  To: Margreta Journey Raley-Gordon; [facility]  Subject: RE: MRI                                          1.5 & 3T Conditional, Smartsync. only needs date for documentation.  Needs updated checklist.  (last was not done as smartsync but cxr has already been reviewed.)   ----- Message -----  From: Levonne Hubert  Sent: 12/13/2022   3:37 PM CDT  To: [facility]  Subject: MRI                                              MRN: [id number]     Name: Johnny [name]      Exam: MRI L Spine WO Contrast      Ordering physician: Haze Justin     Expected date or time frame: Next available on a Tuesday or a Thursday      Paste device information below:     GENERATOR ATRIAL LEAD RV LEAD LV LEAD  Manufacturer: Medtronic            Model Number: Evera MRI [id number] [id number] 4076-52cm Sprint Quattro Secure S MRI [id number]   Serial Number: [id number]   Implant Date: [id number] 01/06/2006 03/13/2018   Device Type: ICD

## 2022-12-15 ENCOUNTER — Encounter: Admit: 2022-12-15 | Discharge: 2022-12-15 | Payer: MEDICARE

## 2022-12-15 NOTE — Progress Notes
I have reviewed the  chest x ray. The system is absent of lead extenders, lead adapters, broken leads, permanent epicardial leads, and abandoned leads. I agree with the documented recommendations. Proceed with MRI scheduling.

## 2022-12-15 NOTE — Progress Notes
[facility]  MRI Recommendations for [place] Cardiac Device Patient Conditional MRI     Patient: Marquiese Cassani  Date of Birth: 1946/06/14  MRN: [id number]    MRI SYSTEM CONDITIONS    Patient has a complete MRI ready system with MR Conditional generator and lead(s) Leads implanted > 6 weeks prior to MRI:                         [x]   Yes    []  No      Device FDA approved to undergo MRI Scan at:  [x]  1.5T    [x]  3T     DEVICE IMAGING      Patient has current chest imaging that meets policy criteria:    [x]   Yes.    []  No. Chest imaging needed to proceed.    Generator is implanted in the pectoral region: [x]   Yes / Leadless []  No      DEVICE EVALUATION      [x]   Patient meets eligibility for SmartSync SureScan.  This section does NOT need completed; the application will collect relevant device data at the time of the MRI.    []  Patient has an S-ICD. This section does NOT need completed.     Generator has sufficient battery (generator is not at end-of-life/ ERI/ EOS/ RRT):  []   Yes   []  No      Patient is historically NOT dependent (as defined by no escape greater than 40 bpm):  []   Correct   []  Incorrect   Lead impedance measurements are within the programmed lead impedance limits (For atrial and ventricular pacing leads: lead impedance 200-2,000 ohms.  For defibrillation leads: lead impedance 20-200 ohms): []   Yes   []  No      Patient tolerates a pacing output of 5.0 V at a pulse width of 1.0 ms []   Yes []  Assess at time of MRI   Pacing Percentages:   [x]  Atrial Pacing:  Not Applicable              [x]  Ventricular Pacing:  Not Applicable            Capture threshold < 3.5 V at a pulse width of 0.5 ms for RA and RV leads for devices that will be programmed to an asynchronous pacing mode []   Yes    []  No      Preliminary recommendations for PRE-MRI SCAN DEVICE SETTINGS*  (Full assessment of programming to be confirmed by device staff or representative at time of MRI)   Pacing rate selected to avoid competitive pacing (recommendations based on historical data). Tachy detections will be turned off for MRI via MRI mode programmed setting:    Utilize SmartSync application to place patient in SureScan mode                   Images Reviewed and Recommendations Made in Collaboration with:  Routed to Provider In-Basket for Review (see result note for sign off) Date: 12/15/22     Assessment completed by: Anner Crete, RN    *The [facility] Device Team has completed this assessment based on current available data in the patient's medical record.  If changes/ concerns are noted at the time of the MRI, the [facility] Device Staff/ Vendor Representative have the discretion to use best clinical judgement to ensure the safety of the patient.      [employer]onic SmartSync application has safety  parameters in place.  If progression to positive SureScan meets lockout criteria, please contact the [facility] Device Team or [employer] representative.  Please send report of final programming parameters sent to [place] [facility] device team (Email: [email or web address] Fax: [phone])      [place]    [email or web address]  [facility] [employer]     [email or web address]    [place]     [phone]  [email or web address]

## 2022-12-16 ENCOUNTER — Encounter: Admit: 2022-12-16 | Discharge: 2022-12-16 | Payer: MEDICARE

## 2022-12-16 ENCOUNTER — Ambulatory Visit: Admit: 2022-12-16 | Discharge: 2022-12-17 | Payer: MEDICARE

## 2022-12-16 DIAGNOSIS — I255 Ischemic cardiomyopathy: Secondary | ICD-10-CM

## 2022-12-16 DIAGNOSIS — E785 Hyperlipidemia, unspecified: Secondary | ICD-10-CM

## 2022-12-16 DIAGNOSIS — I4891 Unspecified atrial fibrillation: Secondary | ICD-10-CM

## 2022-12-16 DIAGNOSIS — T148XXA Other injury of unspecified body region, initial encounter: Secondary | ICD-10-CM

## 2022-12-16 DIAGNOSIS — M5136 Other intervertebral disc degeneration, lumbar region: Secondary | ICD-10-CM

## 2022-12-16 DIAGNOSIS — G4733 Obstructive sleep apnea (adult) (pediatric): Secondary | ICD-10-CM

## 2022-12-16 DIAGNOSIS — M549 Dorsalgia, unspecified: Secondary | ICD-10-CM

## 2022-12-16 DIAGNOSIS — M2550 Pain in unspecified joint: Secondary | ICD-10-CM

## 2022-12-16 DIAGNOSIS — Z95810 Presence of automatic (implantable) cardiac defibrillator: Secondary | ICD-10-CM

## 2022-12-16 DIAGNOSIS — K449 Diaphragmatic hernia without obstruction or gangrene: Secondary | ICD-10-CM

## 2022-12-16 DIAGNOSIS — J449 Chronic obstructive pulmonary disease, unspecified: Secondary | ICD-10-CM

## 2022-12-16 DIAGNOSIS — G5600 Carpal tunnel syndrome, unspecified upper limb: Secondary | ICD-10-CM

## 2022-12-16 DIAGNOSIS — D699 Hemorrhagic condition, unspecified: Secondary | ICD-10-CM

## 2022-12-16 DIAGNOSIS — I2510 Atherosclerotic heart disease of native coronary artery without angina pectoris: Secondary | ICD-10-CM

## 2022-12-16 DIAGNOSIS — M1990 Unspecified osteoarthritis, unspecified site: Secondary | ICD-10-CM

## 2022-12-16 DIAGNOSIS — H269 Unspecified cataract: Secondary | ICD-10-CM

## 2022-12-16 DIAGNOSIS — N529 Male erectile dysfunction, unspecified: Secondary | ICD-10-CM

## 2022-12-16 DIAGNOSIS — I509 Heart failure, unspecified: Secondary | ICD-10-CM

## 2022-12-16 DIAGNOSIS — H9190 Unspecified hearing loss, unspecified ear: Secondary | ICD-10-CM

## 2022-12-16 DIAGNOSIS — I10 Essential (primary) hypertension: Secondary | ICD-10-CM

## 2022-12-16 DIAGNOSIS — M4800 Spinal stenosis, site unspecified: Secondary | ICD-10-CM

## 2022-12-16 DIAGNOSIS — K219 Gastro-esophageal reflux disease without esophagitis: Secondary | ICD-10-CM

## 2022-12-16 DIAGNOSIS — E669 Obesity, unspecified: Secondary | ICD-10-CM

## 2022-12-16 DIAGNOSIS — R053 Chronic cough: Secondary | ICD-10-CM

## 2022-12-16 DIAGNOSIS — I219 Acute myocardial infarction, unspecified: Secondary | ICD-10-CM

## 2022-12-16 DIAGNOSIS — K5900 Constipation, unspecified: Secondary | ICD-10-CM

## 2022-12-16 DIAGNOSIS — J302 Other seasonal allergic rhinitis: Secondary | ICD-10-CM

## 2022-12-16 DIAGNOSIS — I340 Nonrheumatic mitral (valve) insufficiency: Secondary | ICD-10-CM

## 2022-12-16 DIAGNOSIS — R7303 Prediabetes: Secondary | ICD-10-CM

## 2022-12-16 DIAGNOSIS — IMO0002 Ulcer: Secondary | ICD-10-CM

## 2022-12-30 ENCOUNTER — Encounter: Admit: 2022-12-30 | Discharge: 2022-12-30 | Payer: MEDICARE

## 2022-12-30 MED ORDER — FUROSEMIDE 40 MG PO TAB
ORAL_TABLET | ORAL | 3 refills | 90.00000 days | Status: AC
Start: 2022-12-30 — End: ?

## 2023-01-03 ENCOUNTER — Encounter: Admit: 2023-01-03 | Discharge: 2023-01-03 | Payer: MEDICARE

## 2023-01-03 ENCOUNTER — Ambulatory Visit: Admit: 2023-01-03 | Discharge: 2023-01-03 | Payer: MEDICARE

## 2023-01-03 DIAGNOSIS — M5136 Other intervertebral disc degeneration, lumbar region: Secondary | ICD-10-CM

## 2023-01-03 DIAGNOSIS — M1990 Unspecified osteoarthritis, unspecified site: Secondary | ICD-10-CM

## 2023-01-03 DIAGNOSIS — M549 Dorsalgia, unspecified: Secondary | ICD-10-CM

## 2023-01-03 DIAGNOSIS — R053 Chronic cough: Secondary | ICD-10-CM

## 2023-01-03 DIAGNOSIS — I4891 Unspecified atrial fibrillation: Secondary | ICD-10-CM

## 2023-01-03 DIAGNOSIS — M5417 Radiculopathy, lumbosacral region: Secondary | ICD-10-CM

## 2023-01-03 DIAGNOSIS — I10 Essential (primary) hypertension: Secondary | ICD-10-CM

## 2023-01-03 DIAGNOSIS — I2510 Atherosclerotic heart disease of native coronary artery without angina pectoris: Secondary | ICD-10-CM

## 2023-01-03 DIAGNOSIS — E785 Hyperlipidemia, unspecified: Secondary | ICD-10-CM

## 2023-01-03 DIAGNOSIS — M4800 Spinal stenosis, site unspecified: Secondary | ICD-10-CM

## 2023-01-03 DIAGNOSIS — T148XXA Other injury of unspecified body region, initial encounter: Secondary | ICD-10-CM

## 2023-01-03 DIAGNOSIS — M2550 Pain in unspecified joint: Secondary | ICD-10-CM

## 2023-01-03 DIAGNOSIS — J449 Chronic obstructive pulmonary disease, unspecified: Secondary | ICD-10-CM

## 2023-01-03 DIAGNOSIS — Z95810 Presence of automatic (implantable) cardiac defibrillator: Secondary | ICD-10-CM

## 2023-01-03 DIAGNOSIS — N529 Male erectile dysfunction, unspecified: Secondary | ICD-10-CM

## 2023-01-03 DIAGNOSIS — K5900 Constipation, unspecified: Secondary | ICD-10-CM

## 2023-01-03 DIAGNOSIS — D699 Hemorrhagic condition, unspecified: Secondary | ICD-10-CM

## 2023-01-03 DIAGNOSIS — IMO0002 Ulcer: Secondary | ICD-10-CM

## 2023-01-03 DIAGNOSIS — E669 Obesity, unspecified: Secondary | ICD-10-CM

## 2023-01-03 DIAGNOSIS — I340 Nonrheumatic mitral (valve) insufficiency: Secondary | ICD-10-CM

## 2023-01-03 DIAGNOSIS — I219 Acute myocardial infarction, unspecified: Secondary | ICD-10-CM

## 2023-01-03 DIAGNOSIS — H9190 Unspecified hearing loss, unspecified ear: Secondary | ICD-10-CM

## 2023-01-03 DIAGNOSIS — G5600 Carpal tunnel syndrome, unspecified upper limb: Secondary | ICD-10-CM

## 2023-01-03 DIAGNOSIS — I509 Heart failure, unspecified: Secondary | ICD-10-CM

## 2023-01-03 DIAGNOSIS — I255 Ischemic cardiomyopathy: Secondary | ICD-10-CM

## 2023-01-03 DIAGNOSIS — K219 Gastro-esophageal reflux disease without esophagitis: Secondary | ICD-10-CM

## 2023-01-03 DIAGNOSIS — G4733 Obstructive sleep apnea (adult) (pediatric): Secondary | ICD-10-CM

## 2023-01-03 DIAGNOSIS — J302 Other seasonal allergic rhinitis: Secondary | ICD-10-CM

## 2023-01-03 DIAGNOSIS — R7303 Prediabetes: Secondary | ICD-10-CM

## 2023-01-03 DIAGNOSIS — H269 Unspecified cataract: Secondary | ICD-10-CM

## 2023-01-03 DIAGNOSIS — K449 Diaphragmatic hernia without obstruction or gangrene: Secondary | ICD-10-CM

## 2023-01-03 MED ORDER — IOHEXOL 300 MG IODINE/ML IV SOLN
1 mL | Freq: Once | 0 refills | Status: CP
Start: 2023-01-03 — End: ?

## 2023-01-03 MED ORDER — LIDOCAINE (PF) 10 MG/ML (1 %) IJ SOLN
6 mL | Freq: Once | INTRAMUSCULAR | 0 refills | Status: CP
Start: 2023-01-03 — End: ?

## 2023-01-03 MED ORDER — LIDOCAINE (PF) 20 MG/ML (2 %) IJ SOLN
5 mL | Freq: Once | INTRAMUSCULAR | 0 refills | Status: CP
Start: 2023-01-03 — End: ?

## 2023-01-03 MED ORDER — DEXAMETHASONE SODIUM PHOS (PF) 10 MG/ML IJ EPIDURAL SOLN
15 mg | Freq: Once | EPIDURAL | 0 refills | Status: CP
Start: 2023-01-03 — End: ?

## 2023-01-03 NOTE — Progress Notes
I have examined the patient, and there are no significant changes in their condition, from the previous H&P performed on 12/13/22.    Bilateral L4-5 TFESI    This patient's clinical history, exam, AND imaging support radiculopathy AND there is a significant impact on quality of life and function AND the pain has been present for at least 4 weeks AND they have failed to improve with noninvasive conservative care.         [facility] Inter[facility]  Subjective     Chief Complaint:   Chief Complaint   Patient presents with    Lower Back - Pain    Left Hip - Pain    Right Hip - Pain    Right Knee - Pain       HPI: Johnny [name] is a 77 y.o. 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 who presents for evaluation.     Patient presents to clinic for follow-up of LBP/knee pain.      S/P lumbar RFA on 10/24/2022 - minimal relief      S/P SCS placement on 11/17/2021  He is reporting charging difficulty - taking only 5 minutes to charge  Reports 50% relief with SCS  Radiating pain in LLE has improved by 90%  Lower back pain is better in the am, but worsens by evening  Right knee pain continues to have pain      Patient endorses achiness/heaviness of legs with walking a long distance that improves with rest. Positive Shopping Cart sign.     Johnny [name] denies any recent fevers, chills, infection, antibiotics, bowel or bladder incontinence, saddle anesthesia. +Xarelto for a. fib.  Recent diagnosis of parkinsons    Taking tizanidine, reports mod. pain relief.. Has decreased due to dizziness.        PRIOR MEDICATIONS:   Effective  Acetaminophen (little)  Tizanidine    Ineffective  Gabapentin    Unable to tolerate  NSAID    Never  Lyrica  Ami/Nortriptyline  Cymbalta      PRIOR INTERVENTIONS:  L-spine surgery with hardware L2-3 L3-4 and then later L4-5 and L5-1 next (2 surgeries, most recent in 2008) Right TKA with revision  Effective  Bilateral SIJ (OSH, good benefit)  Left L5-S2 TFESI  right knee RFA - genicular  Lumbar RFA      Ineffective  ESI x3 (OSH)      Past Medical History:  Medical History:   Diagnosis Date    Atrial fibrillation (HCC) 02/08/2009    Back pain     Bleeding disorder (HCC) when started Xarelto    CAD (coronary artery disease) 02/08/2009    Carpal tunnel syndrome     Cataract 2017    both removed    Chronic cough     Congestive heart disease (HCC)  Constipation 2015    COPD (chronic obstructive pulmonary disease) (HCC)     Degenerative disc disease, lumbar 2010    Erectile dysfunction, vasculogenic     Fracture     GERD (gastroesophageal reflux disease)     controlled with elevated HOB, protonix and pepcid     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 now    Heart attack (HCC) possible in 2006    Hiatal hernia     Hyperlipemia 02/11/2009    Hypertension 02/11/2009    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    Joint pain 1966    Mitral regurgitation     Myocardial infarction (HCC) 2006    Obesity, Class I, 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)     OSA on CPAP 02/11/2009    Osteoarthritis     Pre-diabetes     S/P ICD (internal cardiac defibrillator) procedure 02/11/2009    Seasonal allergies     Spinal stenosis 2010    Ulcer        Family History:  Family History   Problem Relation Age of Onset    Cancer Father     Heart problem Father     Heart Disease Father     Hypertension Mother     Joint Pain Mother     Cancer Mother     Cancer Sister        Social History:  Lives in [place] [id number] (1.25 hours away)    Social History     Socioeconomic History    Marital status: Married   Tobacco Use    Smoking status: Never    Smokeless tobacco: Never   Vaping Use    Vaping status: Never Used   Substance and Sexual Activity    Alcohol use: Not Currently     Comment: very rarely 4 drinks per year    Drug use: No    Sexual activity: Yes     Partners: Female     Birth control/protection: None       Allergies:  Allergies   Allergen Reactions    Clarithromycin RASH     Allergy recorded in SMS: Biaxin~Reactions: RASH~THRUSH    Ketoconazole SEE COMMENTS     Had bloodshot eyes after use, and sore.    Lisinopril COUGH    Oxycodone UNKNOWN     Nightmare       Medications:    Current Outpatient Medications:     acetaminophen (TYLENOL) 325 mg tablet, Take two tablets by mouth every 4 hours as needed for Pain., Disp: 300 tablet, Rfl: 1    albuterol (VENTOLIN HFA, PROAIR HFA) 90 mcg/actuation inhaler, Inhale two puffs by mouth into the lungs four times daily as needed for Wheezing., Disp: , Rfl:     aspirin 81 mg chewable tablet, Take 1 Tab by mouth daily. WAIT to restart until finished taking lovenox injections. (Patient taking differently: Chew one tablet by mouth at bedtime daily.), Disp: 90 Tab, Rfl: 3    carbidopa/levodopa (SINEMET) 25/100 mg tablet, Take 2 tabs in the AM, 2 tabs in the afternoon, and 1 tab in the evening, Disp: 450 tablet, Rfl: 3    cetirizine (ZYRTEC) 10 mg tablet, Take one tablet by mouth daily., Disp: , Rfl: cholecalciferol (Vitamin D3) (VITAMIN D-3) 1,000 units tablet, Take one tablet by mouth twice daily.  , Disp: , Rfl:     dextromethorphan/guaiFENesin (MUCINEX DM) 30/600 mg Tb12, Take two tablets by mouth  daily as needed., Disp: , Rfl:     docusate (COLACE) 100 mg capsule, Take two capsules by mouth at bedtime daily. Pt is taking 550 mg daily, Disp: , Rfl:     ezetimibe (ZETIA) 10 mg tablet, TAKE 1 TABLET BY MOUTH AT BEDTIME, Disp: 90 tablet, Rfl: 1    famotidine (PEPCID) 20 mg tablet, Take one tablet by mouth at bedtime daily. Indications: gastroesophageal reflux disease, Disp: 90 tablet, Rfl: 1    fesoterodine ER (TOVIAZ) 4 mg tablet, Take one tablet to two tablets by mouth every 48 hours., Disp: 30 tablet, Rfl: 11    fexofenadine(+) (ALLEGRA) 180 mg tablet, Take one tablet by mouth daily., Disp: , Rfl:     fluticasone-umeclidin-vilanter (TRELEGY ELLIPTA) 100-62.5-25 mcg inhaler, Inhale 1 Dose by mouth into the lungs at bedtime daily., Disp: , Rfl:     furosemide (LASIX) 40 mg tablet, TAKE 2 TABLETS BY MOUTH IN DAILY TAKE 3 TABLETS ON MONDAY, WEDNEDSAY, FRIDAY AS DIRECTED BY CARDIOLOGY, Disp: 270 tablet, Rfl: 3    gabapentin (NEURONTIN) 300 mg capsule, Take one capsule by mouth three times daily., Disp: 270 capsule, Rfl: 1    glucosamine su 2KCl-chondroit 500-400 mg tab, Take 1 Tab by mouth twice daily., Disp: , Rfl:     ipratropium bromide (ATROVENT) 42 mcg (0.06 %) nasal spray, Apply two sprays to each nostril as directed twice daily as needed., Disp: , Rfl:     losartan (COZAAR) 50 mg tablet, TAKE 1 TABLET BY MOUTH DAILY, Disp: 90 tablet, Rfl: 2    metoprolol succinate XL (TOPROL XL) 100 mg extended release tablet, TAKE 1 TABLET BY MOUTH EVERY DAY, Disp: 90 tablet, Rfl: 3    mucus clearing device (AEROBIKA OSCILLATING PEP SYSTM MISC), Use  as directed. Use with salt water and inhale twice daily, Disp: , Rfl:     OMEGA-3 FATTY ACIDS-FISH OIL PO, Take 1 capsule by mouth twice daily., Disp: , Rfl: omeprazole DR (PRILOSEC) 40 mg capsule, Take one capsule by mouth twice daily. Indications: gastroesophageal reflux disease, Disp: 180 capsule, Rfl: 1    other medication, mupirocin ointment and budesonide solution mixed with warm water Use as nasal irrigation twice daily, Disp: , Rfl:     polyethylene glycol 3350 (GLYCOLAX; MIRALAX) 17 gram/dose powder, Take 17 g by mouth daily. (Patient taking differently: Take seventeen g by mouth nightly as needed.), Disp: 595 g, Rfl: 3    potassium chloride SR (K-DUR) 20 mEq tablet, TAKE 2 TABLETS BY MOUTH ON MONDAY, WEDNESDAY AND FRIDAY AND 1 TABLET ALL OTHER DAYS, Disp: 140 tablet, Rfl: 3    predniSONE (DELTASONE) 10 mg tablet, Take one tablet by mouth daily., Disp: , Rfl:     pyRIDostigmine bromide (MESTINON) 60 mg tablet, Take one tablet by mouth three times daily. 1.5 tablets 3 times daily, Disp: , Rfl:     rivaroxaban (XARELTO) 20 mg tablet, Take one tablet by mouth daily., Disp: 30 tablet, Rfl: 11    rosuvastatin (CRESTOR) 40 mg tablet, TAKE 1 TABLET BY MOUTH AT BEDTIME, Disp: 90 tablet, Rfl: 2    spironolactone (ALDACTONE) 25 mg tablet, TAKE 1 TABLET BY MOUTH EVERY DAY WITH FOOD, Disp: 90 tablet, Rfl: 3    testosterone cypionate 200 mg/mL kit, Inject 1 mL into the muscle every 14 days., Disp: , Rfl:     tiZANidine (ZANAFLEX) 2 mg tablet, TAKE 1 TO 3 TABLETS BY MOUTH AT BEDTIME AS NEEDED, Disp: 90 tablet, Rfl: 1    traMADoL (ULTRAM) 50  mg tablet, Take one tablet by mouth every 8 hours as needed for Pain., Disp: 30 tablet, Rfl: 0    traZODone (DESYREL) 100 mg tablet, 1.5 tablets., Disp: , Rfl:     Vacuum Erection Device System kit, Use as directed for sexual activity., Disp: 1 kit, Rfl: 11    Physical examination:   BP 96/57  - Pulse 72  - Temp 36.4 ?C (97.5 ?F)  - Ht 180.3 cm (5' 11)  - Wt 104.2 kg (229 lb 12.8 oz)  - SpO2 98%  - BMI 32.05 kg/m?   Pain Score: Seven    Physical Exam:  General: The patient is a well-developed, well nourished 77 y.o. male in no acute distress.   HEENT: Head is normocephalic and atraumatic. Pupils are equal and reactive to light bilaterally.   Cardiac: Based on palpation, pulse appears to be regular rate and rhythm.   Pulmonary: The patient has unlabored respirations and bilateral symmetric chest excursion.   Abdomen: Soft, nontender, and nondistended.   Extremities: No clubbing, cyanosis, or edema.     Neurologic:   The patient is alert and oriented times 3.       Musculoskeletal:   Gait is antalgic - cane      Lumbar spine:  Appearance: No lesions or deformity  Lumbar tenderness: moderate tenderness, increased paraspinal muscle tone  SI joint tenderness: None  Pain with extension: Yes  Pain with lateral flexion: None  FABER positive?: None  Lower extremity strength: 5/5 bilaterally  Sensation to light touch: Intact and equal in the bilateral lower extremities  Straight leg raise positive?: Bilateral      MS:   Root Right Left   Hip Flexion L2 5 5   Knee Flexion L5/S1 5 5   Knee Extension L3 5 5   Dorsiflexion L4 5 5   Plantarflexion S1 5 5   EHL Extension L5 5 5       Results for orders placed during the hospital encounter of 08/24/20    MRI L-SPINE WO/W CONTRAST    Addendum 08/25/2020  5:39 PM  Finalized by Ivory Broad, M.D. on 08/24/2020 5:00 PM. Dictated by Delfin Gant, M.D. on 08/24/2020 3:54 PM.Addendum:  Delfin Gant discussed these findings via telephone with Dr. Francee Nodal nurse, Vassie Moment, at 8:34 AM on 08/25/2020.      Approved by Delfin Gant, M.D. on 08/25/2020 8:35 AM    By my electronic signature, I attest that I have personally reviewed the images for this examination and formulated the interpretations and opinions expressed in this report      Finalized by Ivory Broad, M.D. on 08/25/2020 5:36 PM. Dictated by Delfin Gant, M.D. on 08/25/2020 8:33 AM.    Narrative  EXAM: MRI L-SPINE    HISTORY:    , lumbar radiculapathy,    Technique: Multiple sagittal and axial MR sequences were obtained of the lumbar spine with and without MultiHance contrast.    Comparison: CT lumbar spine August 27, 2014    FINDINGS:    There are 5 nonrib-bearing lumbar type vertebral bodies. There is left convexity lumbar spinal curvature. Trace retrolisthesis at L2-L3 and trace anterolisthesis of L5-S1. X-Stop devices are seen at the spinous processes of the lumbar spine. Vertebral body heights are maintained. Multilevel vertebral endplate marrow changes. No suspicious bone marrow replacing lesion. The conus is normal in appearance and position at the T12-L1 level. abnormal enhancing lesion is identified. Bilateral renal atrophy. Small upper pole right renal cyst. There is an  additional 1.3 cm upper pole right renal lesion which demonstrates T2 and T1 hypointensity (series 5, image 8).    T12-L1: No significant central spinal or neuroforaminal stenosis.    L1-L2: Marked disc degeneration, mild circumferential disc bulge, and facet hypertrophy. Mild central spinal stenosis. Moderate left and marked right neural foraminal stenosis.    L2-L3: Trace retrolisthesis, moderate disc degeneration, circumferential disc bulge, facet hypertrophy, and mild malignant flavum thickening. Mild central spinal stenosis. Moderate left and marked right neuroforaminal stenosis.    L3-L4: Moderate disc degeneration, circumferential disc bulge, and facet hypertrophy. Moderate to marked spinal stenosis. Marked bilateral neuroforaminal stenosis.    L4-L5: Mild disc degeneration, circumferential disc bulge with superimposed central disc protrusion, ligamentum flavum thickening, and facet hypertrophy. Moderate to marked central spinal stenosis. Marked bilateral neural foraminal stenosis.    L5-S1: Mild disc degeneration, circumferential disc bulge, and facet hypertrophy. Moderate central spinal stenosis. Marked bilateral neural foraminal stenosis.    Impression  1.  Multilevel degenerative central spinal stenosis, greatest of moderate to marked degree at L3-L4, L4-L5, and L5-S1.  2. Multilevel degenerative neuroforaminal stenosis, greatest of marked degree on the right at L1-L2 and L2-L3 as well as bilaterally at L3-L4, L4-5, and L5-S1.  3.  Left convexity lumbar spinal curvature.  4.  Trace retrolisthesis of L2-L3 and trace anterolisthesis of L5-S1.  5.  T2 and T1 hypointense peripheral right renal lesion. MRI abdomen (renal mass protocol) is recommended for further evaluation.    By my electronic signature, I attest that I have personally reviewed the images for this examination and formulated the interpretations and opinions expressed in this report          Last Cr and LFT's:  Creatinine   Date Value Ref Range Status   03/31/2021 1.30 (H) 0.4 - 1.24 MG/DL Final     AST (SGOT)   Date Value Ref Range Status   11/22/2018 25 7 - 40 U/L Final     ALT (SGPT)   Date Value Ref Range Status   11/22/2018 24 7 - 56 U/L Final     Alk Phosphatase   Date Value Ref Range Status   11/22/2018 51 25 - 110 U/L Final     Total Bilirubin   Date Value Ref Range Status   11/22/2018 0.8 0.3 - 1.2 MG/DL Final          Assessment:    Leovanni Abaya is a 77 y.o. 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 who presents for evaluation of pain.    The pain complaints are most likely due to:    1. Lumbosacral radiculopathy  [facility] AMB SPINE TRANSFORAMINAL LUMBAR/SACRAL    MRI L-SPINE WO CONTRAST    [place] AMB SPINE TRANSFORAMINAL LUMBAR/SACRAL            Patient has had an adequate  trial of > 12 months of rest, exercise, multimodal treatment, and the passage of time without improvement of symptoms. The pain has significant impact on the daily quality of life.     Plan:    1.Discussed care options with patient. Plan on bilateral L4-L5 TFESI at next available appointment.  This patient's clinical history, exam, AND imaging support radiculopathy AND there is a significant impact on quality of life and function AND the pain has been present for at least 4 weeks AND they have failed to improve with noninvasive conservative care.     2. Update MRI L-spine  - consider referral to surgery  Failing conservative measures including formal PT, pharmacological agents and injections  3. Encouraged to continue at home PT program.  4. Follow up after injection        Risks/benefits of all pharmacologic and interventional treatments discussed and questions answered.

## 2023-01-03 NOTE — Discharge Instructions - Supplementary Instructions
GENERAL POST PROCEDURE INSTRUCTIONS  Physician: _________________________________  Procedure Completed Today:  Joint Injection (hip, knee, shoulder)  Cervical Epidural Steroid Injection  Cervical Transforaminal Steroid Injection  Trigger Point Injection  Caudal Epidural Steroid Injection  Piriformis Injection  Pudendal Nerve Block  Other _____________________ Thoracic Epidural Steroid Injection  Lumbar Epidural Steroid Injection  Lumbar Transforaminal Steroid Injection  Facet Joint Injection  Celiac Nerve Block  Sacrococcygeal  Sacroiliac Joint Injection   Important information following your procedure today:  You may drive today     If you had sedation, you may NOT drive today  Rest at home for the next 6 hours.  You may then begin to resume your normal activities.  DO NOT drive any vehicle, operate any power tools, drink alcohol, make any major decisions, or sign any legal documents for the next 12 hours.  Pain relief may not be immediate. It is possible you may even experience an increase in pain during the first 24-48 hours followed by a gradual decrease of your pain.  Though the procedure is generally safe, and complications are rare, we do ask that you be aware of any of the following:  Any swelling, persistent redness, new bleeding or drainage from the site of the injection.  You should not experience a severe headache.  You should not run a fever over 101oF.  New onset of sharp, severe back and or neck pain.  New onset of upper or lower extremity numbness or weakness.  New difficulty controlling bowel or bladder function after injection.  New shortness of breath.  ** If any of these occur, please call to report this occurrence to a nurse at [phone]. If you are calling after 4:00 p.m. or on weekends or holidays, please call [phone] and ask to have the resident physician on call for the physician paged or go to your local emergency room.  You may experience soreness at the injection site. Ice can be applied at 20-minute intervals for the first 24 hours. The following day you may alternate ice with heat if you are experiencing muscle tightness, otherwise continue with ice. Ice works best at decreasing pain. Avoid application of direct heat, hot showers or hot tubs today.  Avoid strenuous activity today. You many resume your regular activities and exercise tomorrow.  Patients with diabetes may see an elevation in blood sugars for 7-10 days after the injection. It is important to pay close attention to your diet, check your blood sugars daily and report extreme elevations to the physician that manages your diabetes.  Patients taking daily blood thinners can resume their regular dose this evening.  It is important that you take all medications ordered by your pain physician. Taking medications as ordered is an important part of your pain care plan. If you cannot continue the medication plan, please notify the physician.    Possible side effects to steroids that may occur:  Flushing or redness of the face  Irritability  Fluid retention  Change in women's menses  Minor headache    If you are unable to keep your upcoming appointment, please notify the [facility] scheduler at [phone] at least 24 hours in advance. If you have questions for the surgery center, call [facility] at [phone].

## 2023-01-03 NOTE — Procedures
Attending Surgeon: Evelina Bucy, MD    Anesthesia: Local    Pre-Procedure Diagnosis:   1. Lumbosacral radiculopathy        Post-Procedure Diagnosis:   1. Lumbosacral radiculopathy             SNR/TF LMBR/SAC Injection  Procedure: transforaminal epidural    Laterality: bilateral   on 01/03/2023 2:46 PM  Location: lumbar - L4-5      Consent:   Consent obtained: written  Consent given by: patient  Risks discussed: allergic reaction, bleeding, bruising, infection, nerve damage, no change or worsening in pain, reaction to medication, seizure, swelling and weakness  Alternatives discussed: alternative treatment, delayed treatment and no treatment  Discussed with patient the purpose of the treatment/procedure, other ways of treating my condition, including no treatment/ procedure and the risks and benefits of the alternatives. Patient has decided to proceed with treatment/procedure.        Universal Protocol:  Relevant documents: relevant documents present and verified  Site marked: the operative site was marked  Patient identity confirmed: Patient identify confirmed verbally with patient.        Time out: Immediately prior to procedure a time out was called to verify the correct patient, procedure, equipment, support staff and site/side marked as required      Procedures Details:   Indications: pain   Prep: chlorhexidine  Patient position: prone  Estimated Blood Loss: minimal  Specimens: none  Number of Levels: 1  Guidance: fluoroscopy  Contrast: Procedure confirmed with contrast under live fluoroscopy.  Needle and Epidural Catheter: quincke  Needle size: 25 G  Injection procedure: Incremental injection and Negative aspiration for blood  Patient tolerance: Patient tolerated the procedure well with no immediate complications. Pressure was applied, and hemostasis was accomplished.  Outcome: Pain relieved  Comments:   LUMBAR/SACRAL TRANSFORAMINAL EPIDURAL STEROID INJECTION PROCEDURE:    1) bilateral L4-5 transforaminal epidural steroid injection  2) Fluoroscopic needle guidance    REASON FOR PROCEDURE: Lumbar radiculopathy    PHYSICIAN: Evelina Bucy, MD    MEDICATIONS INJECTED: 0.75 mL of Dexamethasone (7.5 mg) + 0.75 ml lidocaine 1% at each level    LOCAL ANESTHETIC INJECTED: 1 mL of 1% lidocaine per site    SEDATION MEDICATIONS: None    ESTIMATED BLOOD LOSS: None    SPECIMENS REMOVED: None    COMPLICATIONS: None    TECHNIQUE: Time-out was taken to identify the correct patient, procedure and side prior to starting the procedure. Lying in a prone position, the patient was prepped and draped in the usual sterile fashion using DuraPrep and a fenestrated drape. The area to be injected was determined under fluoroscopic guidance. Local anesthetic was given by raising a skin wheal and going down to the hub of a 27-gauge 1.25-inch needle.     The 3.5-inch 25-gauge Quincke needle was advanced toward the 6 o'clock position of the pedicle at each above-named nerve root level. The needle was advanced to the final position via a lateral fluoroscopic intermittent image. Omnipaque 240 was injected under live fluoroscopy and showed epidural spread and there was no vascular runoff. After a negative aspiration, the medication was then injected.     The procedure was completed without complications and was tolerated well. The patient was monitored after the procedure. The patient (or responsible party) was given post-procedure and discharge instructions to follow at home. The patient was discharged in stable condition.       Administrations This Visit       dexamethasone PF (  DECADRON) epidural injection 15 mg       Admin Date  01/03/2023 Action  Given Dose  15 mg Route  Epidural Documented By  Evelina Bucy, MD              iohexoL (OMNIPAQUE-300) 300 mg/mL injection 1 mL       Admin Date  01/03/2023 Action  Given Dose  1 mL Route  SEE ADMIN INSTRUCTIONS Documented By  Evelina Bucy, MD              lidocaine (PF) 20 mg/mL (2 %) injection 100 mg Admin Date  01/03/2023 Action  Given Dose  100 mg Route  Injection Documented By  Evelina Bucy, MD              lidocaine PF 1% (10 mg/mL) injection 6 mL       Admin Date  01/03/2023 Action  Given Dose  6 mL Route  Injection Documented By  Evelina Bucy, MD                  Estimated blood loss: none or minimal  Specimens: none  Patient tolerated the procedure well with no immediate complications. Pressure was applied, and hemostasis was accomplished.

## 2023-01-05 ENCOUNTER — Encounter: Admit: 2023-01-05 | Discharge: 2023-01-05 | Payer: MEDICARE

## 2023-01-12 ENCOUNTER — Encounter: Admit: 2023-01-12 | Discharge: 2023-01-12 | Payer: MEDICARE

## 2023-01-12 ENCOUNTER — Ambulatory Visit: Admit: 2023-01-12 | Discharge: 2023-01-12 | Payer: MEDICARE

## 2023-01-12 DIAGNOSIS — R053 Chronic cough: Secondary | ICD-10-CM

## 2023-01-12 DIAGNOSIS — K449 Diaphragmatic hernia without obstruction or gangrene: Secondary | ICD-10-CM

## 2023-01-12 DIAGNOSIS — E785 Hyperlipidemia, unspecified: Secondary | ICD-10-CM

## 2023-01-12 DIAGNOSIS — J302 Other seasonal allergic rhinitis: Secondary | ICD-10-CM

## 2023-01-12 DIAGNOSIS — I340 Nonrheumatic mitral (valve) insufficiency: Secondary | ICD-10-CM

## 2023-01-12 DIAGNOSIS — G5600 Carpal tunnel syndrome, unspecified upper limb: Secondary | ICD-10-CM

## 2023-01-12 DIAGNOSIS — I255 Ischemic cardiomyopathy: Secondary | ICD-10-CM

## 2023-01-12 DIAGNOSIS — M5136 Other intervertebral disc degeneration, lumbar region: Secondary | ICD-10-CM

## 2023-01-12 DIAGNOSIS — IMO0002 Ulcer: Secondary | ICD-10-CM

## 2023-01-12 DIAGNOSIS — M1990 Unspecified osteoarthritis, unspecified site: Secondary | ICD-10-CM

## 2023-01-12 DIAGNOSIS — I4891 Unspecified atrial fibrillation: Secondary | ICD-10-CM

## 2023-01-12 DIAGNOSIS — K629 Disease of anus and rectum, unspecified: Secondary | ICD-10-CM

## 2023-01-12 DIAGNOSIS — K648 Other hemorrhoids: Secondary | ICD-10-CM

## 2023-01-12 DIAGNOSIS — M4800 Spinal stenosis, site unspecified: Secondary | ICD-10-CM

## 2023-01-12 DIAGNOSIS — R7303 Prediabetes: Secondary | ICD-10-CM

## 2023-01-12 DIAGNOSIS — K219 Gastro-esophageal reflux disease without esophagitis: Secondary | ICD-10-CM

## 2023-01-12 DIAGNOSIS — M2550 Pain in unspecified joint: Secondary | ICD-10-CM

## 2023-01-12 DIAGNOSIS — I219 Acute myocardial infarction, unspecified: Secondary | ICD-10-CM

## 2023-01-12 DIAGNOSIS — K5900 Constipation, unspecified: Secondary | ICD-10-CM

## 2023-01-12 DIAGNOSIS — I509 Heart failure, unspecified: Secondary | ICD-10-CM

## 2023-01-12 DIAGNOSIS — D699 Hemorrhagic condition, unspecified: Secondary | ICD-10-CM

## 2023-01-12 DIAGNOSIS — N529 Male erectile dysfunction, unspecified: Secondary | ICD-10-CM

## 2023-01-12 DIAGNOSIS — I10 Essential (primary) hypertension: Secondary | ICD-10-CM

## 2023-01-12 DIAGNOSIS — J449 Chronic obstructive pulmonary disease, unspecified: Secondary | ICD-10-CM

## 2023-01-12 DIAGNOSIS — I2510 Atherosclerotic heart disease of native coronary artery without angina pectoris: Secondary | ICD-10-CM

## 2023-01-12 DIAGNOSIS — G4733 Obstructive sleep apnea (adult) (pediatric): Secondary | ICD-10-CM

## 2023-01-12 DIAGNOSIS — Z95810 Presence of automatic (implantable) cardiac defibrillator: Secondary | ICD-10-CM

## 2023-01-12 DIAGNOSIS — M549 Dorsalgia, unspecified: Secondary | ICD-10-CM

## 2023-01-12 DIAGNOSIS — H269 Unspecified cataract: Secondary | ICD-10-CM

## 2023-01-12 DIAGNOSIS — E669 Obesity, unspecified: Secondary | ICD-10-CM

## 2023-01-12 DIAGNOSIS — H9190 Unspecified hearing loss, unspecified ear: Secondary | ICD-10-CM

## 2023-01-12 DIAGNOSIS — T148XXA Other injury of unspecified body region, initial encounter: Secondary | ICD-10-CM

## 2023-01-12 MED ORDER — SODIUM CHLORIDE 0.9 % IV SOLP
250 mL | INTRAVENOUS | 0 refills
Start: 2023-01-12 — End: ?

## 2023-01-12 NOTE — Progress Notes
MRI abdomen dated 11/23/2020 sent to PCP on file. Report recommends a follow up MRCP in 2 years.

## 2023-01-12 NOTE — Progress Notes
Name: Johnny [name]          MRN: [id number]      DOB: [birth year]      AGE: 77 y.o.   DATE OF SERVICE: 01/12/2023    Subjective:             Reason for Visit:  New Patient      Johnny [name] is a 77 y.o. male.      Cancer Staging   No matching staging information was found for the patient.      History of Present Illness  77 y/o M presents after recent endoscopic finding of an anal canal lesion. He also complains of a chronically prolapsed hemorrhoid that occasionally bothers him. Despite several GI notes that state he struggles with constipation, he denies any issues and says that he manages it very well with diet and stool softeners.      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    Heart attack (HCC) possible in 2006    Hiatal hernia     Hyperlipemia 02/11/2009    Hypertension 02/11/2009    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    Joint pain 1966    Mitral regurgitation     Myocardial infarction (HCC) 2006    Obesity, Class I, 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)     OSA on CPAP 02/11/2009    Osteoarthritis     Pre-diabetes     S/P ICD (internal cardiac defibrillator) procedure 02/11/2009    Seasonal allergies     Spinal stenosis 2010    Ulcer      Surgical History:   Procedure Laterality Date    HUMERUS FRACTURE SURGERY Left 2004    MITRAL VALVULOPLASTY  2006    same time as bypass    CORONARY ARTERY BYPASS GRAFT  04/14/2005    CABGx5 LIMA-LAD, L Rad-Dx, sSVG-RCA-RCA, sSVG-OM & MV Plasty for MR:    RHYTHM DEVICE PLACEMENT  2007    defibrillator    FOOT SURGERY Bilateral 2012    Tarsal tunnel release w/ bunionectomy and hammer toe     EAR SURGERY Bilateral 2012    Ear Tubes    SINUS SURGERY  10/2011    CARDIOVASCULAR STRESS TEST  09/2013    KNEE REPLACEMENT Right 05/2014    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    Performed by Simonne Maffucci, MD at [facility] OR    TRANSVENOUS REMOVAL IMPLANTABLE DEFIBRILLATOR RIGHT VENTRICULAR LEAD Left 03/13/2018    Performed by Kathreen Cornfield, MD at [facility] CVOR    INSERTION RIGHT VENTRICULAR LEAD Left 03/13/2018    Performed by Kathreen Cornfield, MD at [facility] CVOR    FLUOROSCOPY - CARDIAC  03/13/2018    Performed by Kathreen Cornfield, MD at [facility] CVOR    INSERTION/ REPLACEMENT TEMPORARY PACEMAKER LEAD/ CATHETER  03/13/2018    Performed by Kathreen Cornfield, MD at [facility] CVOR    REMOVAL AND REPLACEMENT IMPLANTABLE DEFIBRILLATOR GENERATOR - DUAL LEAD SYSTEM  03/13/2018    Performed by Kathreen Cornfield, MD at [facility] CVOR    CARDIOTHORACIC SURGERY STANDBY N/A 03/13/2018    Performed by Cath, Physician at [facility] CVOR    INTRACARDIAC CATHETER ABLATION WITH COMPREHENSIVE ELECTROPHYSIOLOGIC EVALUATION - ATRIAL FIBRILLATION, RADIOFREQUENCY N/A 11/22/2018    Performed by Kathreen Cornfield, MD at [facility] EP LAB    INTRACARDIAC ELECTROPHYSIOLOGIC 3-DIMENSIONAL MAPPING N/A 11/22/2018    Performed by Kathreen Cornfield, MD at [facility] EP LAB    INTRACARDIAC ECHOCARDIOGRAPHY N/A 11/22/2018    Performed by Kathreen Cornfield, MD at [facility] EP LAB    POSSIBLE INTRAVENOUS DRUG INFUSION FOR STIMULATION AND PACING N/A 11/22/2018    Performed by Kathreen Cornfield, MD at [facility] EP LAB    POSSIBLE INSERTION ESOPHAGEAL DEVIATOR N/A 11/22/2018    Performed by Kathreen Cornfield, MD at [facility] EP LAB    POSSIBLE EXTERNAL CARDIOVERSION N/A 11/22/2018    Performed by Kathreen Cornfield, MD at [facility] EP LAB    INTRACARDIAC CATHETER ABLATION WITH COMPREHENSIVE ELECTROPHYSIOLOGIC EVALUATION - ATYPICAL FLUTTER Right 11/22/2018    Performed by Kathreen Cornfield, MD at [facility] EP LAB    TRANSESOPHAGEAL ECHOCARDIOGRAM DURING INTERVENTION N/A 11/22/2018    Performed by Cath, Physician at [facility] EP LAB    INSERTION SPINAL NEUROSTIMULATOR PULSE GENERATOR/ RECEIVER N/A 11/17/2021    Performed by Evelina Bucy, MD at [facility] OR    PERCUTANEOUS IMPLANTATION EPIDURAL NEUROSTIMULATOR ELECTRODE ARRAY x2 N/A 11/17/2021    Performed by Evelina Bucy, MD at [facility] OR    ELECTRONIC ANALYSIS IMPLANTED NEUROSTIMULATOR PULSE GENERATOR SYSTEM - COMPLEX SPINAL CORD/ PERIPHERAL NEUROSTIMULATOR PULSE GENERATOR/ TRANSMITTER WITH INTRA OPERATIVE/ SUBSEQUENT PROGRAMING N/A 11/17/2021    Performed by Evelina Bucy, MD at [facility] OR    SIGMOIDOSCOPY DIAGNOSTIC WITH COLLECTION SPECIMEN BY BRUSHING/ WASHING - FLEXIBLE N/A 09/30/2022    Performed by Jolee Ewing, MD at [facility] ENDO    ESOPHAGOGASTRODUODENOSCOPY WITH SPECIMEN COLLECTION BY BRUSHING/ WASHING N/A 09/30/2022    Performed by Jolee Ewing, MD at [facility] ENDO    CARDIAC DEFIBRILLATOR PLACEMENT  11/2013, 02-2018    S/P Fidelis ICD Lead Extraction and New Lead Implantation    HX BACK SURGERY  2013, 2006    Lumbart Laminectomy and Fusion    HX CARPAL TUNNEL RELEASE Right 1990s    HX CORONARY ARTERY BYPASS GRAFT  2006    HX ENDOSCOPY  2019    HX HEART CATHETERIZATION      HX HEMORRHOIDECTOMY  2021, 2022    KNEE SURGERY  2015, 2016, 2020    UMBILICAL ARTERIAL CATH - BEDSIDE  2006    UPPER GASTROINTESTINAL ENDOSCOPY       Family History   Problem Relation Age of Onset    Cancer Father     Heart problem Father     Heart Disease Father     Hypertension Mother     Joint Pain Mother     Cancer Mother     Cancer Sister      Social History     Socioeconomic History    Marital status: Married   Tobacco Use    Smoking status: Never    Smokeless tobacco: Never  Vaping Use    Vaping status: Never Used   Substance and Sexual Activity    Alcohol use: Not Currently     Comment: very rarely 4 drinks per year    Drug use: No    Sexual activity: Yes     Partners: Female     Birth control/protection: None     Vaping/E-liquid Use    Vaping Use Never User                     Review of Systems   Constitutional: Negative.    HENT: Negative.     Eyes: Negative.    Respiratory: Negative.     Cardiovascular: Negative.    Gastrointestinal:  Positive for rectal pain.   Endocrine: Negative.    Genitourinary: Negative.    Musculoskeletal: Negative.    Skin: Negative.    Allergic/Immunologic: Negative.    Neurological: Negative.    Hematological: Negative.    Psychiatric/Behavioral: Negative.     All other systems reviewed and are negative.      Objective:          acetaminophen (TYLENOL) 325 mg tablet Take two tablets by mouth every 4 hours as needed for Pain.    albuterol (VENTOLIN HFA, PROAIR HFA) 90 mcg/actuation inhaler Inhale two puffs by mouth into the lungs four times daily as needed for Wheezing.    aspirin 81 mg chewable tablet Take 1 Tab by mouth daily. WAIT to restart until finished taking lovenox injections. (Patient taking differently: Chew one tablet by mouth at bedtime daily.)    carbidopa/levodopa (SINEMET) 25/100 mg tablet Take 2 tabs in the AM, 2 tabs in the afternoon, and 1 tab in the evening    cetirizine (ZYRTEC) 10 mg tablet Take one tablet by mouth daily.    cholecalciferol (Vitamin D3) (VITAMIN D-3) 1,000 units tablet Take one tablet by mouth twice daily.      dextromethorphan/guaiFENesin (MUCINEX DM) 30/600 mg Tb12 Take two tablets by mouth daily as needed.    docusate (COLACE) 100 mg capsule Take two capsules by mouth at bedtime daily. Pt is taking 550 mg daily    ezetimibe (ZETIA) 10 mg tablet TAKE 1 TABLET BY MOUTH AT BEDTIME    famotidine (PEPCID) 20 mg tablet Take one tablet by mouth at bedtime daily. Indications: gastroesophageal reflux disease    fesoterodine ER (TOVIAZ) 4 mg tablet Take one tablet to two tablets by mouth every 48 hours.    fexofenadine(+) (ALLEGRA) 180 mg tablet Take one tablet by mouth daily.    fluticasone-umeclidin-vilanter (TRELEGY ELLIPTA) 100-62.5-25 mcg inhaler Inhale 1 Dose by mouth into the lungs at bedtime daily.    furosemide (LASIX) 40 mg tablet Take 2 tabs on Sunday-Tuesday- Thursday.  Take 3 tabs on Monday-Wednesday-Friday-Saturday    gabapentin (NEURONTIN) 300 mg capsule Take one capsule by mouth three times daily.    glucosamine su 2KCl-chondroit 500-400 mg tab Take 1 Tab by mouth twice daily.    ipratropium bromide (ATROVENT) 42 mcg (0.06 %) nasal spray Apply two sprays to each nostril as directed twice daily as needed.    losartan (COZAAR) 50 mg tablet TAKE 1 TABLET BY MOUTH DAILY    metoprolol succinate XL (TOPROL XL) 100 mg extended release tablet TAKE 1 TABLET BY MOUTH EVERY DAY    mucus clearing device (AEROBIKA OSCILLATING PEP SYSTM MISC) Use  as directed. Use with salt water and inhale twice daily    OMEGA-3 FATTY ACIDS-FISH OIL PO Take 1  capsule by mouth twice daily.    omeprazole DR (PRILOSEC) 40 mg capsule Take one capsule by mouth twice daily. Indications: gastroesophageal reflux disease    other medication mupirocin ointment and budesonide solution mixed with warm water  Use as nasal irrigation twice daily    polyethylene glycol 3350 (GLYCOLAX; MIRALAX) 17 gram/dose powder Take 17 g by mouth daily. (Patient taking differently: Take seventeen g by mouth nightly as needed.)    potassium chloride SR (K-DUR) 20 mEq tablet TAKE 2 TABLETS BY MOUTH ON MONDAY, WEDNESDAY AND FRIDAY AND 1 TABLET ALL OTHER DAYS    predniSONE (DELTASONE) 10 mg tablet Take one tablet by mouth daily.    pyRIDostigmine bromide (MESTINON) 60 mg tablet Take one tablet by mouth three times daily. 1.5 tablets 3 times daily    rivaroxaban (XARELTO) 20 mg tablet Take one tablet by mouth daily.    rosuvastatin (CRESTOR) 40 mg tablet TAKE 1 TABLET BY MOUTH AT BEDTIME    spironolactone (ALDACTONE) 25 mg tablet TAKE 1 TABLET BY MOUTH EVERY DAY WITH FOOD    testosterone cypionate 200 mg/mL kit Inject 1 mL into the muscle every 14 days.    tiZANidine (ZANAFLEX) 2 mg tablet TAKE 1 TO 3 TABLETS BY MOUTH AT BEDTIME AS NEEDED    traMADoL (ULTRAM) 50 mg tablet Take one tablet by mouth every 8 hours as needed for Pain.    traZODone (DESYREL) 100 mg tablet 1.5 tablets.    Vacuum Erection Device System kit Use as directed for sexual activity.    warfarin (COUMADIN) 3 mg tablet Take one tablet by mouth daily.     Vitals:    01/12/23 1313   Resp: 16   PainSc: Zero   Weight: 98.8 kg (217 lb 12.8 oz)     Body mass index is 30.38 kg/m?Marland Kitchen     Pain Score: Zero       Fatigue Scale: 0-None         Physical Exam  Vitals reviewed.   Constitutional:       Appearance: Normal appearance.   HENT:      Head: Normocephalic and atraumatic.      Right Ear: External ear normal.      Left Ear: External ear normal.      Nose: Nose normal.      Mouth/Throat:      Pharynx: Oropharynx is clear.   Eyes:      Conjunctiva/sclera: Conjunctivae normal.   Cardiovascular:      Rate and Rhythm: Normal rate.   Pulmonary:      Effort: Pulmonary effort is normal.   Musculoskeletal:         General: Normal range of motion.      Cervical back: Normal range of motion.   Skin:     General: Skin is warm and dry.   Neurological:      General: No focal deficit present.      Mental Status: He is alert and oriented to person, place, and time.   Psychiatric:         Mood and Affect: Mood normal.         Behavior: Behavior normal.         Thought Content: Thought content normal.         Judgment: Judgment normal.               Assessment and Plan:  54M w/ anal lesion and symptomatic hemorrhoid  -after discussing the indications for surgery, as  well as the risks/benfits/alternatives, the patient agrees to proceed; plan for EUA w/ excision of anal canal lesion and hemorrhoidectomy

## 2023-01-19 ENCOUNTER — Encounter: Admit: 2023-01-19 | Discharge: 2023-01-19 | Payer: MEDICARE

## 2023-01-19 MED ORDER — OMEPRAZOLE 40 MG PO CPDR
40 mg | ORAL_CAPSULE | Freq: Two times a day (BID) | ORAL | 0 refills | Status: AC
Start: 2023-01-19 — End: ?

## 2023-01-19 NOTE — Telephone Encounter
Refill request received for   Name from pharmacy: OMEPRAZOLE 40MG           Will file in chart as: omeprazole DR (PRILOSEC) 40 mg capsule    Sig: TAKE 1 CAPSULE BY MOUTH TWICE DAILY    Disp: 180 capsule    Refills: 0    Start: 01/19/2023    Class: Normal    Non-formulary    To pharmacy: * * N O T I C E * * Last quantity doesn't match original quantity 01/19/2023 12:12:15 PM    Last ordered: 3 months ago (09/26/2022) by Tempie Hoist, DO    Last refill: 12/26/2022    Rx #: [id number]    Gastroenterology: Acid Reflux Passed05/10/2022 12:16 PM   Protocol Details Valid encounter within last 12 months      To be filled at: [facility] [place], [place] - [address]     Last OV 12/16/22

## 2023-01-23 ENCOUNTER — Encounter: Admit: 2023-01-23 | Discharge: 2023-01-23 | Payer: MEDICARE

## 2023-01-23 DIAGNOSIS — K629 Disease of anus and rectum, unspecified: Secondary | ICD-10-CM

## 2023-01-30 ENCOUNTER — Encounter: Admit: 2023-01-30 | Discharge: 2023-01-30 | Payer: MEDICARE

## 2023-01-30 MED ORDER — LOSARTAN 50 MG PO TAB
50 mg | ORAL_TABLET | Freq: Every day | ORAL | 1 refills | 90.00000 days | Status: AC
Start: 2023-01-30 — End: ?

## 2023-01-31 ENCOUNTER — Ambulatory Visit: Admit: 2023-01-31 | Discharge: 2023-01-31 | Payer: MEDICARE

## 2023-01-31 ENCOUNTER — Encounter: Admit: 2023-01-31 | Discharge: 2023-01-31 | Payer: MEDICARE

## 2023-01-31 DIAGNOSIS — Z01818 Encounter for other preprocedural examination: Secondary | ICD-10-CM

## 2023-01-31 DIAGNOSIS — K629 Disease of anus and rectum, unspecified: Secondary | ICD-10-CM

## 2023-02-01 ENCOUNTER — Encounter: Admit: 2023-02-01 | Discharge: 2023-02-01 | Payer: MEDICARE

## 2023-02-21 ENCOUNTER — Encounter: Admit: 2023-02-21 | Discharge: 2023-02-21 | Payer: MEDICARE

## 2023-02-21 ENCOUNTER — Ambulatory Visit: Admit: 2023-02-21 | Discharge: 2023-02-21 | Payer: MEDICARE

## 2023-02-21 DIAGNOSIS — Z136 Encounter for screening for cardiovascular disorders: Secondary | ICD-10-CM

## 2023-02-21 DIAGNOSIS — E669 Obesity, unspecified: Secondary | ICD-10-CM

## 2023-02-21 DIAGNOSIS — M549 Dorsalgia, unspecified: Secondary | ICD-10-CM

## 2023-02-21 DIAGNOSIS — IMO0002 Ulcer: Secondary | ICD-10-CM

## 2023-02-21 DIAGNOSIS — Z95810 Presence of automatic (implantable) cardiac defibrillator: Secondary | ICD-10-CM

## 2023-02-21 DIAGNOSIS — I340 Nonrheumatic mitral (valve) insufficiency: Secondary | ICD-10-CM

## 2023-02-21 DIAGNOSIS — I4891 Unspecified atrial fibrillation: Secondary | ICD-10-CM

## 2023-02-21 DIAGNOSIS — M5136 Other intervertebral disc degeneration, lumbar region: Secondary | ICD-10-CM

## 2023-02-21 DIAGNOSIS — R053 Chronic cough: Secondary | ICD-10-CM

## 2023-02-21 DIAGNOSIS — I2510 Atherosclerotic heart disease of native coronary artery without angina pectoris: Secondary | ICD-10-CM

## 2023-02-21 DIAGNOSIS — E785 Hyperlipidemia, unspecified: Secondary | ICD-10-CM

## 2023-02-21 DIAGNOSIS — I219 Acute myocardial infarction, unspecified: Secondary | ICD-10-CM

## 2023-02-21 DIAGNOSIS — I509 Heart failure, unspecified: Secondary | ICD-10-CM

## 2023-02-21 DIAGNOSIS — I255 Ischemic cardiomyopathy: Secondary | ICD-10-CM

## 2023-02-21 DIAGNOSIS — R7303 Prediabetes: Secondary | ICD-10-CM

## 2023-02-21 DIAGNOSIS — H9190 Unspecified hearing loss, unspecified ear: Secondary | ICD-10-CM

## 2023-02-21 DIAGNOSIS — G5600 Carpal tunnel syndrome, unspecified upper limb: Secondary | ICD-10-CM

## 2023-02-21 DIAGNOSIS — I5022 Chronic systolic (congestive) heart failure: Secondary | ICD-10-CM

## 2023-02-21 DIAGNOSIS — G4733 Obstructive sleep apnea (adult) (pediatric): Secondary | ICD-10-CM

## 2023-02-21 DIAGNOSIS — J449 Chronic obstructive pulmonary disease, unspecified: Secondary | ICD-10-CM

## 2023-02-21 DIAGNOSIS — T148XXA Other injury of unspecified body region, initial encounter: Secondary | ICD-10-CM

## 2023-02-21 DIAGNOSIS — N529 Male erectile dysfunction, unspecified: Secondary | ICD-10-CM

## 2023-02-21 DIAGNOSIS — M1990 Unspecified osteoarthritis, unspecified site: Secondary | ICD-10-CM

## 2023-02-21 DIAGNOSIS — I10 Essential (primary) hypertension: Secondary | ICD-10-CM

## 2023-02-21 DIAGNOSIS — K449 Diaphragmatic hernia without obstruction or gangrene: Secondary | ICD-10-CM

## 2023-02-21 DIAGNOSIS — J302 Other seasonal allergic rhinitis: Secondary | ICD-10-CM

## 2023-02-21 DIAGNOSIS — M2550 Pain in unspecified joint: Secondary | ICD-10-CM

## 2023-02-21 DIAGNOSIS — K219 Gastro-esophageal reflux disease without esophagitis: Secondary | ICD-10-CM

## 2023-02-21 DIAGNOSIS — D699 Hemorrhagic condition, unspecified: Secondary | ICD-10-CM

## 2023-02-21 DIAGNOSIS — K5900 Constipation, unspecified: Secondary | ICD-10-CM

## 2023-02-21 DIAGNOSIS — H269 Unspecified cataract: Secondary | ICD-10-CM

## 2023-02-21 DIAGNOSIS — M4800 Spinal stenosis, site unspecified: Secondary | ICD-10-CM

## 2023-02-21 NOTE — Patient Instructions
Follow-Up:    -Thank you for allowing me to take care of you today. My name is Inayah Woodin, RN.    -We would like you to follow up in   1. years with Dr. Rhea C.Pimentel   The schedule is released approximately 4-5 months in advance. You should be called or mailed to make an appointment, however if you would like to call us to make this appt, please call [phone].        -You will receive a survey in the upcoming week from [facility]. Your feedback is important to us, and helps us continue to improve patient care and patient satisfaction.       Contacting our office:    -Business Hours: Monday-Friday, 8:00 am-4:30 pm (excluding Holidays).     -For medical questions or concerns, please send us a message through your MyChart account or call the [facility] nursing triage line at [phone]. Please leave a detailed message with your name, date of birth, and reason for your call.  If your message is received before 3:30pm, every effort will be made to call you back the same day.  Please allow time for us to review your chart prior to call back.     -For medication refills please start by contacting your pharmacy. You can also send us a prescription question through your MyChart or call the nurse triage line above.     -Should you have an immediate need of the weekend/nights and holidays, please call our on-call triage line at [phone].    -Our fax number is [phone].    Results & Testing Follow Up:    -Please allow 10-15 business days for the results of any testing to be reviewed. Please call our office if you have not heard from a nurse within this time frame.    -Should you choose to complete testing at an outside facility, please contact our office after completion of testing so that we can ensure that we have received results.    Lab and test results:  As a part of the CARES act, starting 12/19/2019, some results will be released to you via mychart immediately and automatically.  You may see results before your provider sees them; however, your provider will review all these results and then they, or one of their team, will notify you of result information and recommendations.   Critical results will be addressed immediately, but otherwise, please allow us time to get back with you prior to you reaching out to us for questions.  This will usually take about 72 hours for labs and 5-7 days for procedure test results.      We know you have a choice and want to thank you for choosing [facility].

## 2023-02-22 ENCOUNTER — Encounter: Admit: 2023-02-22 | Discharge: 2023-02-22 | Payer: MEDICARE

## 2023-02-22 DIAGNOSIS — J449 Chronic obstructive pulmonary disease, unspecified: Secondary | ICD-10-CM

## 2023-02-22 DIAGNOSIS — H269 Unspecified cataract: Secondary | ICD-10-CM

## 2023-02-22 DIAGNOSIS — I340 Nonrheumatic mitral (valve) insufficiency: Secondary | ICD-10-CM

## 2023-02-22 DIAGNOSIS — H9190 Unspecified hearing loss, unspecified ear: Secondary | ICD-10-CM

## 2023-02-22 DIAGNOSIS — Z95810 Presence of automatic (implantable) cardiac defibrillator: Secondary | ICD-10-CM

## 2023-02-22 DIAGNOSIS — K5900 Constipation, unspecified: Secondary | ICD-10-CM

## 2023-02-22 DIAGNOSIS — M1990 Unspecified osteoarthritis, unspecified site: Secondary | ICD-10-CM

## 2023-02-22 DIAGNOSIS — R7303 Prediabetes: Secondary | ICD-10-CM

## 2023-02-22 DIAGNOSIS — T148XXA Other injury of unspecified body region, initial encounter: Secondary | ICD-10-CM

## 2023-02-22 DIAGNOSIS — IMO0002 Ulcer: Secondary | ICD-10-CM

## 2023-02-22 DIAGNOSIS — I255 Ischemic cardiomyopathy: Secondary | ICD-10-CM

## 2023-02-22 DIAGNOSIS — K449 Diaphragmatic hernia without obstruction or gangrene: Secondary | ICD-10-CM

## 2023-02-22 DIAGNOSIS — N529 Male erectile dysfunction, unspecified: Secondary | ICD-10-CM

## 2023-02-22 DIAGNOSIS — M5136 Other intervertebral disc degeneration, lumbar region: Secondary | ICD-10-CM

## 2023-02-22 DIAGNOSIS — I509 Heart failure, unspecified: Secondary | ICD-10-CM

## 2023-02-22 DIAGNOSIS — E785 Hyperlipidemia, unspecified: Secondary | ICD-10-CM

## 2023-02-22 DIAGNOSIS — I10 Essential (primary) hypertension: Secondary | ICD-10-CM

## 2023-02-22 DIAGNOSIS — R053 Chronic cough: Secondary | ICD-10-CM

## 2023-02-22 DIAGNOSIS — G4733 Obstructive sleep apnea (adult) (pediatric): Secondary | ICD-10-CM

## 2023-02-22 DIAGNOSIS — I219 Acute myocardial infarction, unspecified: Secondary | ICD-10-CM

## 2023-02-22 DIAGNOSIS — M2550 Pain in unspecified joint: Secondary | ICD-10-CM

## 2023-02-22 DIAGNOSIS — I2510 Atherosclerotic heart disease of native coronary artery without angina pectoris: Secondary | ICD-10-CM

## 2023-02-22 DIAGNOSIS — M4800 Spinal stenosis, site unspecified: Secondary | ICD-10-CM

## 2023-02-22 DIAGNOSIS — K219 Gastro-esophageal reflux disease without esophagitis: Secondary | ICD-10-CM

## 2023-02-22 DIAGNOSIS — I4891 Unspecified atrial fibrillation: Secondary | ICD-10-CM

## 2023-02-22 DIAGNOSIS — G5600 Carpal tunnel syndrome, unspecified upper limb: Secondary | ICD-10-CM

## 2023-02-22 DIAGNOSIS — D699 Hemorrhagic condition, unspecified: Secondary | ICD-10-CM

## 2023-02-22 DIAGNOSIS — E669 Obesity, unspecified: Secondary | ICD-10-CM

## 2023-02-22 DIAGNOSIS — M549 Dorsalgia, unspecified: Secondary | ICD-10-CM

## 2023-02-22 DIAGNOSIS — J302 Other seasonal allergic rhinitis: Secondary | ICD-10-CM

## 2023-02-27 ENCOUNTER — Encounter: Admit: 2023-02-27 | Discharge: 2023-02-27 | Payer: MEDICARE

## 2023-02-27 MED ADMIN — SODIUM CHLORIDE 0.9 % IV SOLP [27838]: 250 mL | INTRAVENOUS | @ 13:00:00 | Stop: 2023-02-27 | NDC 00338004902

## 2023-02-28 ENCOUNTER — Ambulatory Visit: Admit: 2023-02-28 | Discharge: 2023-02-28 | Payer: MEDICARE

## 2023-02-28 ENCOUNTER — Encounter: Admit: 2023-02-28 | Discharge: 2023-02-28 | Payer: MEDICARE

## 2023-02-28 DIAGNOSIS — E669 Obesity, unspecified: Secondary | ICD-10-CM

## 2023-02-28 DIAGNOSIS — IMO0002 Ulcer: Secondary | ICD-10-CM

## 2023-02-28 DIAGNOSIS — I255 Ischemic cardiomyopathy: Secondary | ICD-10-CM

## 2023-02-28 DIAGNOSIS — E785 Hyperlipidemia, unspecified: Secondary | ICD-10-CM

## 2023-02-28 DIAGNOSIS — M792 Neuralgia and neuritis, unspecified: Secondary | ICD-10-CM

## 2023-02-28 DIAGNOSIS — K449 Diaphragmatic hernia without obstruction or gangrene: Secondary | ICD-10-CM

## 2023-02-28 DIAGNOSIS — K5900 Constipation, unspecified: Secondary | ICD-10-CM

## 2023-02-28 DIAGNOSIS — R7303 Prediabetes: Secondary | ICD-10-CM

## 2023-02-28 DIAGNOSIS — M5417 Radiculopathy, lumbosacral region: Secondary | ICD-10-CM

## 2023-02-28 DIAGNOSIS — K219 Gastro-esophageal reflux disease without esophagitis: Secondary | ICD-10-CM

## 2023-02-28 DIAGNOSIS — H269 Unspecified cataract: Secondary | ICD-10-CM

## 2023-02-28 DIAGNOSIS — M4800 Spinal stenosis, site unspecified: Secondary | ICD-10-CM

## 2023-02-28 DIAGNOSIS — G4733 Obstructive sleep apnea (adult) (pediatric): Secondary | ICD-10-CM

## 2023-02-28 DIAGNOSIS — I340 Nonrheumatic mitral (valve) insufficiency: Secondary | ICD-10-CM

## 2023-02-28 DIAGNOSIS — M2550 Pain in unspecified joint: Secondary | ICD-10-CM

## 2023-02-28 DIAGNOSIS — I4891 Unspecified atrial fibrillation: Secondary | ICD-10-CM

## 2023-02-28 DIAGNOSIS — M549 Dorsalgia, unspecified: Secondary | ICD-10-CM

## 2023-02-28 DIAGNOSIS — N529 Male erectile dysfunction, unspecified: Secondary | ICD-10-CM

## 2023-02-28 DIAGNOSIS — M1990 Unspecified osteoarthritis, unspecified site: Secondary | ICD-10-CM

## 2023-02-28 DIAGNOSIS — J302 Other seasonal allergic rhinitis: Secondary | ICD-10-CM

## 2023-02-28 DIAGNOSIS — J449 Chronic obstructive pulmonary disease, unspecified: Secondary | ICD-10-CM

## 2023-02-28 DIAGNOSIS — T148XXA Other injury of unspecified body region, initial encounter: Secondary | ICD-10-CM

## 2023-02-28 DIAGNOSIS — M25561 Pain in right knee: Secondary | ICD-10-CM

## 2023-02-28 DIAGNOSIS — Z95810 Presence of automatic (implantable) cardiac defibrillator: Secondary | ICD-10-CM

## 2023-02-28 DIAGNOSIS — I10 Essential (primary) hypertension: Secondary | ICD-10-CM

## 2023-02-28 DIAGNOSIS — I2510 Atherosclerotic heart disease of native coronary artery without angina pectoris: Secondary | ICD-10-CM

## 2023-02-28 DIAGNOSIS — H9190 Unspecified hearing loss, unspecified ear: Secondary | ICD-10-CM

## 2023-02-28 DIAGNOSIS — G5600 Carpal tunnel syndrome, unspecified upper limb: Secondary | ICD-10-CM

## 2023-02-28 DIAGNOSIS — D699 Hemorrhagic condition, unspecified: Secondary | ICD-10-CM

## 2023-02-28 DIAGNOSIS — R053 Chronic cough: Secondary | ICD-10-CM

## 2023-02-28 DIAGNOSIS — M5136 Other intervertebral disc degeneration, lumbar region: Secondary | ICD-10-CM

## 2023-02-28 DIAGNOSIS — I219 Acute myocardial infarction, unspecified: Secondary | ICD-10-CM

## 2023-02-28 DIAGNOSIS — I509 Heart failure, unspecified: Secondary | ICD-10-CM

## 2023-02-28 NOTE — Progress Notes
[facility] Interventional [facility]  Subjective     Chief Complaint:   Chief Complaint   Patient presents with    Right Knee - Pain    Spine - Pain       HPI: Johnny [name] is a 77 y.o. 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 who presents for evaluation.     Patient presents to clinic for follow-up of LBP/knee pain.    Here for follow up     S/P SCS placement on 11/17/2021  He is reporting charging difficulty - taking only 5 minutes to charge  Reports 50% relief with SCS  Radiating pain in LLE has improved by 90%  Lower back pain is better in the am, but worsens by evening  Right knee pain continues to have pain    Since last visit, his wife was hospitalized for over 2 weeks with cellulitis.  She is doing better now.       Patient endorses achiness/heaviness of legs with walking a long distance that improves with rest. Positive Shopping Cart sign.     Johnny [name] denies any recent fevers, chills, infection, antibiotics, bowel or bladder incontinence, saddle anesthesia. +Xarelto for a. fib.  Recent diagnosis of parkinsons    Taking tizanidine, reports mod. pain relief.. Has decreased due to dizziness.        PRIOR MEDICATIONS:   Effective  Acetaminophen (little)  Tizanidine    Ineffective  Gabapentin    Unable to tolerate  NSAID    Never  Lyrica  Ami/Nortriptyline  Cymbalta      PRIOR INTERVENTIONS:  L-spine surgery with hardware L2-3 L3-4 and then later L4-5 and L5-1 next (2 surgeries, most recent in 2008) Right TKA with revision  Effective  Bilateral SIJ (OSH, good benefit)  Left L5-S2 TFESI  right knee RFA - genicular  bilateral L4-L5 - transient      Ineffective  ESI x3 (OSH)  Lumbar RFA      Past Medical History:  Past 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    Heart attack (HCC) possible in 2006  Hiatal hernia     Hyperlipemia 02/11/2009    Hypertension 02/11/2009    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    Joint pain 1966    Mitral regurgitation     Myocardial infarction (HCC) 2006    Obesity, Class I, 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)     OSA on CPAP 02/11/2009    Osteoarthritis     Pre-diabetes     S/P ICD (internal cardiac defibrillator) procedure 02/11/2009    Seasonal allergies     Spinal stenosis 2010    Ulcer        Family History:  Family History   Problem Relation Name Age of Onset    Cancer Father Kimm     Heart problem Father Dailyn     Heart Disease Father Slater     Hypertension Mother Britta Mccreedy     Joint Pain Mother Britta Mccreedy     Cancer Mother Britta Mccreedy     Cancer Sister Best boy        Social History:  Lives in [place] [id number] (1.25 hours away)    Social History     Socioeconomic History    Marital status: Married   Tobacco Use    Smoking status: Never    Smokeless tobacco: Never   Vaping Use    Vaping status: Never Used   Substance and Sexual Activity    Alcohol use: Not Currently     Comment: very rarely 4 drinks per year    Drug use: No    Sexual activity: Yes     Partners: Female     Birth control/protection: None       Allergies:  Allergies   Allergen Reactions    Clarithromycin RASH     [place] and burns both ends of GI tract    Ketoconazole SEE COMMENTS     Had bloodshot eyes after use, and sore.  Happens if he gets drug in his eyes.  Tolerates shampoo fine.    Lisinopril COUGH    Oxycodone UNKNOWN     Nightmares  Tolerates Tramadol       Medications:    Current Outpatient Medications:     acetaminophen (TYLENOL) 325 mg tablet, Take two tablets by mouth every 4 hours as needed for Pain., Disp: 300 tablet, Rfl: 1    albuterol (VENTOLIN HFA, PROAIR HFA) 90 mcg/actuation inhaler, Inhale two puffs by mouth into the lungs four times daily as needed for Wheezing., Disp: , Rfl:     alpha lipoic acid 600 mg capsule, Take one capsule by mouth daily., Disp: , Rfl:     aspirin 81 mg chewable tablet, Chew one tablet by mouth at bedtime daily., Disp: , Rfl:     carbidopa/levodopa (SINEMET) 25/100 mg tablet, Take 2 tabs in the AM, 2 tabs in the afternoon, and 1 tab in the evening, Disp: 450 tablet, Rfl: 3    cetirizine (ZYRTEC) 10 mg tablet, Take one tablet by mouth daily., Disp: , Rfl:     cholecalciferol (Vitamin D3) (VITAMIN D-3) 1,000 units tablet, Take one tablet by mouth twice daily.  , Disp: , Rfl:     coenzyme Q10 200 mg capsule, Take one capsule by mouth daily., Disp: , Rfl:     cyanocobalamin (vitamin B-12) 500 mcg tablet, Take one tablet by mouth daily., Disp: , Rfl:     Docusate Sodium 250 mg cap, Take 250mg  in the am and 500mg  in the evening, Disp: ,  Rfl:     ECHINACEA PO, Take 1 capsule by mouth daily., Disp: , Rfl:     ezetimibe (ZETIA) 10 mg tablet, TAKE 1 TABLET BY MOUTH AT BEDTIME, Disp: 90 tablet, Rfl: 1    famotidine (PEPCID) 20 mg tablet, Take one tablet by mouth at bedtime daily. Indications: gastroesophageal reflux disease, Disp: 90 tablet, Rfl: 1    fesoterodine ER (TOVIAZ) 4 mg tablet, Take one tablet to two tablets by mouth every 48 hours., Disp: 30 tablet, Rfl: 11    fexofenadine(+) (ALLEGRA) 180 mg tablet, Take one tablet by mouth daily., Disp: , Rfl:     fluticasone-umeclidin-vilanter (TRELEGY ELLIPTA) 100-62.5-25 mcg inhaler, Inhale 1 Dose by mouth into the lungs daily after dinner., Disp: , Rfl:     furosemide (LASIX) 40 mg tablet, Take 2 tabs on Sunday-Tuesday- Thursday.  Take 3 tabs on Monday-Wednesday-Friday-Saturday (Patient taking differently: Take 2 tabs on Mon-Wed-Fri  Take 3 tabs on Sun-Tues-Thur-Sat), Disp: 226 tablet, Rfl: 3    gabapentin (NEURONTIN) 300 mg capsule, Take one capsule by mouth three times daily., Disp: 270 capsule, Rfl: 1    glucosamine su 2KCl-chondroit 500-400 mg tab, Take 1 Tab by mouth twice daily., Disp: , Rfl:     guaiFENesin LA (MUCINEX) 600 mg tablet, Take one tablet by mouth twice daily., Disp: , Rfl:     ipratropium bromide (ATROVENT) 42 mcg (0.06 %) nasal spray, Apply two sprays to each nostril as directed twice daily as needed., Disp: , Rfl:     ketoconazole (NIZORAL) 2 % topical shampoo, Apply  topically to affected area every 7 days. Apply topically to affected area of damp skin, lather, leave on 5 minutes, and rinse., Disp: , Rfl:     losartan (COZAAR) 50 mg tablet, TAKE 1 TABLET BY MOUTH EVERY DAY (Patient taking differently: Take one tablet by mouth at bedtime daily.), Disp: 90 tablet, Rfl: 1    metoprolol succinate XL (TOPROL XL) 100 mg extended release tablet, TAKE 1 TABLET BY MOUTH EVERY DAY, Disp: 90 tablet, Rfl: 3    mucus clearing device (AEROBIKA OSCILLATING PEP SYSTM MISC), Use  as directed. Use with salt water and inhale twice daily, Disp: , Rfl:     OMEGA-3 FATTY ACIDS-FISH OIL PO, Take 1 capsule by mouth twice daily., Disp: , Rfl:     omeprazole DR (PRILOSEC) 40 mg capsule, TAKE 1 CAPSULE BY MOUTH TWICE DAILY, Disp: 180 capsule, Rfl: 0    other medication, one Dose. Mupirocin dissolved in warm water and Budesonide in NS  qhs  (sometimes increases to BID), Disp: , Rfl:     polyethylene glycol 3350 (MIRALAX) 17 g packet, Take one packet by mouth daily as needed., Disp: , Rfl:     potassium chloride SR (K-DUR) 20 mEq tablet, TAKE 2 TABLETS BY MOUTH ON MONDAY, WEDNESDAY AND FRIDAY AND 1 TABLET ALL OTHER DAYS (Patient taking differently: TAKE 1 TABLETS BY MOUTH ON MONDAY, WEDNESDAY AND FRIDAY AND 1 TABLET TWICE DAILY ON OTHER DAYS), Disp: 140 tablet, Rfl: 3    pyRIDostigmine bromide (MESTINON) 60 mg tablet, Take two tablets by mouth three times daily.  , Disp: , Rfl:     rosuvastatin (CRESTOR) 40 mg tablet, TAKE 1 TABLET BY MOUTH AT BEDTIME, Disp: 90 tablet, Rfl: 2    spironolactone (ALDACTONE) 25 mg tablet, TAKE 1 TABLET BY MOUTH EVERY DAY WITH FOOD, Disp: 90 tablet, Rfl: 3    testosterone cypionate 200 mg/mL kit, Inject 1 mL into the muscle every 14 days.,  Disp: , Rfl:     tolterodine LA (DETROL LA) 2 mg capsule, Take one capsule by mouth daily., Disp: , Rfl:     traZODone (DESYREL) 100 mg tablet, Take one tablet by mouth at bedtime daily., Disp: , Rfl:     Vacuum Erection Device System kit, Use as directed for sexual activity., Disp: 1 kit, Rfl: 11    warfarin (COUMADIN) 3 mg tablet, Take one tablet by mouth at bedtime daily., Disp: , Rfl:     Physical examination:   BP (!) 105/90 (BP Source: Arm, Right Upper, Patient Position: Sitting)  - Pulse 87  - SpO2 98%   Pain Score: Three    Physical Exam:  General: The patient is a well-developed, well nourished 77 y.o. male in no acute distress.   HEENT: Head is normocephalic and atraumatic. Pupils are equal and reactive to light bilaterally.   Cardiac: Based on palpation, pulse appears to be regular rate and rhythm.   Pulmonary: The patient has unlabored respirations and bilateral symmetric chest excursion.   Abdomen: Soft, nontender, and nondistended.   Extremities: No clubbing, cyanosis, or edema.     Neurologic:   The patient is alert and oriented times 3.       Musculoskeletal:   Gait is antalgic - cane  right knee TTP - some edema noted      Lumbar spine:  Appearance: No lesions or deformity  Lumbar tenderness: moderate tenderness, increased paraspinal muscle tone  SI joint tenderness: None  Pain with extension: Yes  Pain with lateral flexion: None  FABER positive?: None  Lower extremity strength: 5/5 bilaterally  Sensation to light touch: Intact and equal in the bilateral lower extremities  Straight leg raise positive?: Bilateral      MS:   Root Right Left   Hip Flexion L2 5 5   Knee Flexion L5/S1 5 5   Knee Extension L3 5 5   Dorsiflexion L4 5 5   Plantarflexion S1 5 5   EHL Extension L5 5 5       Results for orders placed during the hospital encounter of 08/24/20    MRI L-SPINE WO/W CONTRAST    Addendum 08/25/2020  5:39 PM  Finalized by Johnny [name], M.D. on 08/24/2020 5:00 PM. Dictated by Delfin Gant, M.D. on 08/24/2020 3:54 PM.Addendum:  Delfin Gant discussed these findings via telephone with Dr. Francee Nodal nurse, Vassie Moment, at 8:34 AM on 08/25/2020.      Approved by Delfin Gant, M.D. on 08/25/2020 8:35 AM    By my electronic signature, I attest that I have personally reviewed the images for this examination and formulated the interpretations and opinions expressed in this report      Finalized by Johnny [name], M.D. on 08/25/2020 5:36 PM. Dictated by Delfin Gant, M.D. on 08/25/2020 8:33 AM.    Narrative  EXAM: MRI L-SPINE    HISTORY:    , lumbar radiculapathy,    Technique: Multiple sagittal and axial MR sequences were obtained of the lumbar spine with and without MultiHance contrast.    Comparison: CT lumbar spine August 27, 2014    FINDINGS:    There are 5 nonrib-bearing lumbar type vertebral bodies. There is left convexity lumbar spinal curvature. Trace retrolisthesis at L2-L3 and trace anterolisthesis of L5-S1. X-Stop devices are seen at the spinous processes of the lumbar spine. Vertebral body heights are maintained. Multilevel vertebral endplate marrow changes. No suspicious bone marrow replacing lesion. The conus is normal in appearance and position at  the T12-L1 level. abnormal enhancing lesion is identified. Bilateral renal atrophy. Small upper pole right renal cyst. There is an additional 1.3 cm upper pole right renal lesion which demonstrates T2 and T1 hypointensity (series 5, image 8).    T12-L1: No significant central spinal or neuroforaminal stenosis.    L1-L2: Marked disc degeneration, mild circumferential disc bulge, and facet hypertrophy. Mild central spinal stenosis. Moderate left and marked right neural foraminal stenosis.    L2-L3: Trace retrolisthesis, moderate disc degeneration, circumferential disc bulge, facet hypertrophy, and mild malignant flavum thickening. Mild central spinal stenosis. Moderate left and marked right neuroforaminal stenosis.    L3-L4: Moderate disc degeneration, circumferential disc bulge, and facet hypertrophy. Moderate to marked spinal stenosis. Marked bilateral neuroforaminal stenosis.    L4-L5: Mild disc degeneration, circumferential disc bulge with superimposed central disc protrusion, ligamentum flavum thickening, and facet hypertrophy. Moderate to marked central spinal stenosis. Marked bilateral neural foraminal stenosis.    L5-S1: Mild disc degeneration, circumferential disc bulge, and facet hypertrophy. Moderate central spinal stenosis. Marked bilateral neural foraminal stenosis.    Impression  1.  Multilevel degenerative central spinal stenosis, greatest of moderate to marked degree at L3-L4, L4-L5, and L5-S1.  2.  Multilevel degenerative neuroforaminal stenosis, greatest of marked degree on the right at L1-L2 and L2-L3 as well as bilaterally at L3-L4, L4-5, and L5-S1.  3.  Left convexity lumbar spinal curvature.  4.  Trace retrolisthesis of L2-L3 and trace anterolisthesis of L5-S1.  5.  T2 and T1 hypointense peripheral right renal lesion. MRI abdomen (renal mass protocol) is recommended for further evaluation.    By my electronic signature, I attest that I have personally reviewed the images for this examination and formulated the interpretations and opinions expressed in this report          Last Cr and LFT's:  Creatinine   Date Value Ref Range Status   03/31/2021 1.30 (H) 0.4 - 1.24 MG/DL Final     AST (SGOT)   Date Value Ref Range Status   11/22/2018 25 7 - 40 U/L Final     ALT (SGPT)   Date Value Ref Range Status   11/22/2018 24 7 - 56 U/L Final     Alk Phosphatase   Date Value Ref Range Status   11/22/2018 51 25 - 110 U/L Final     Total Bilirubin   Date Value Ref Range Status   11/22/2018 0.8 0.3 - 1.2 MG/DL Final          Assessment:    Efton Winchell is a 77 y.o. 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 who presents for evaluation of pain.    The pain complaints are most likely due to:    1. Lumbosacral radiculopathy        2. Neuropathic pain  3. Chronic pain of right knee                  Patient has had an adequate trial of > 12 months of rest, exercise, multimodal treatment, and the passage of time without improvement of symptoms. The pain has significant impact on the daily quality of life.     Plan:    1.Discussed care options with patient. Nevro present, support provided  2. Update MRI L-spine  - consider referral to surgery  Failing conservative measures including formal PT, pharmacological agents and injections  3. Encouraged to continue at home PT program.  4. Follow up after MRI  5. Consider PNS for right knee - will need to discuss with Dr. Lourdes Sledge, hx of knee replacement surgery      Risks/benefits of all pharmacologic and interventional treatments discussed and questions answered.

## 2023-03-01 ENCOUNTER — Encounter: Admit: 2023-03-01 | Discharge: 2023-03-01 | Payer: MEDICARE

## 2023-03-01 DIAGNOSIS — E669 Obesity, unspecified: Secondary | ICD-10-CM

## 2023-03-01 DIAGNOSIS — D699 Hemorrhagic condition, unspecified: Secondary | ICD-10-CM

## 2023-03-01 DIAGNOSIS — M5136 Other intervertebral disc degeneration, lumbar region: Secondary | ICD-10-CM

## 2023-03-01 DIAGNOSIS — N529 Male erectile dysfunction, unspecified: Secondary | ICD-10-CM

## 2023-03-01 DIAGNOSIS — M2550 Pain in unspecified joint: Secondary | ICD-10-CM

## 2023-03-01 DIAGNOSIS — H269 Unspecified cataract: Secondary | ICD-10-CM

## 2023-03-01 DIAGNOSIS — IMO0002 Ulcer: Secondary | ICD-10-CM

## 2023-03-01 DIAGNOSIS — M4800 Spinal stenosis, site unspecified: Secondary | ICD-10-CM

## 2023-03-01 DIAGNOSIS — I10 Essential (primary) hypertension: Secondary | ICD-10-CM

## 2023-03-01 DIAGNOSIS — M549 Dorsalgia, unspecified: Secondary | ICD-10-CM

## 2023-03-01 DIAGNOSIS — I4891 Unspecified atrial fibrillation: Secondary | ICD-10-CM

## 2023-03-01 DIAGNOSIS — I255 Ischemic cardiomyopathy: Secondary | ICD-10-CM

## 2023-03-01 DIAGNOSIS — I2510 Atherosclerotic heart disease of native coronary artery without angina pectoris: Secondary | ICD-10-CM

## 2023-03-01 DIAGNOSIS — R053 Chronic cough: Secondary | ICD-10-CM

## 2023-03-01 DIAGNOSIS — I509 Heart failure, unspecified: Secondary | ICD-10-CM

## 2023-03-01 DIAGNOSIS — I340 Nonrheumatic mitral (valve) insufficiency: Secondary | ICD-10-CM

## 2023-03-01 DIAGNOSIS — K5900 Constipation, unspecified: Secondary | ICD-10-CM

## 2023-03-01 DIAGNOSIS — G5600 Carpal tunnel syndrome, unspecified upper limb: Secondary | ICD-10-CM

## 2023-03-01 DIAGNOSIS — K449 Diaphragmatic hernia without obstruction or gangrene: Secondary | ICD-10-CM

## 2023-03-01 DIAGNOSIS — I219 Acute myocardial infarction, unspecified: Secondary | ICD-10-CM

## 2023-03-01 DIAGNOSIS — Z95810 Presence of automatic (implantable) cardiac defibrillator: Secondary | ICD-10-CM

## 2023-03-01 DIAGNOSIS — M1990 Unspecified osteoarthritis, unspecified site: Secondary | ICD-10-CM

## 2023-03-01 DIAGNOSIS — R7303 Prediabetes: Secondary | ICD-10-CM

## 2023-03-01 DIAGNOSIS — J449 Chronic obstructive pulmonary disease, unspecified: Secondary | ICD-10-CM

## 2023-03-01 DIAGNOSIS — J302 Other seasonal allergic rhinitis: Secondary | ICD-10-CM

## 2023-03-01 DIAGNOSIS — T148XXA Other injury of unspecified body region, initial encounter: Secondary | ICD-10-CM

## 2023-03-01 DIAGNOSIS — E785 Hyperlipidemia, unspecified: Secondary | ICD-10-CM

## 2023-03-01 DIAGNOSIS — H9190 Unspecified hearing loss, unspecified ear: Secondary | ICD-10-CM

## 2023-03-01 DIAGNOSIS — K219 Gastro-esophageal reflux disease without esophagitis: Secondary | ICD-10-CM

## 2023-03-01 DIAGNOSIS — G4733 Obstructive sleep apnea (adult) (pediatric): Secondary | ICD-10-CM

## 2023-03-02 ENCOUNTER — Encounter: Admit: 2023-03-02 | Discharge: 2023-03-02 | Payer: MEDICARE

## 2023-03-06 ENCOUNTER — Encounter: Admit: 2023-03-06 | Discharge: 2023-03-06 | Payer: MEDICARE

## 2023-03-06 ENCOUNTER — Ambulatory Visit: Admit: 2023-03-06 | Discharge: 2023-03-06 | Payer: MEDICARE

## 2023-03-06 DIAGNOSIS — Z01818 Encounter for other preprocedural examination: Secondary | ICD-10-CM

## 2023-03-06 DIAGNOSIS — I509 Heart failure, unspecified: Secondary | ICD-10-CM

## 2023-03-06 DIAGNOSIS — H9190 Unspecified hearing loss, unspecified ear: Secondary | ICD-10-CM

## 2023-03-06 DIAGNOSIS — M2550 Pain in unspecified joint: Secondary | ICD-10-CM

## 2023-03-06 DIAGNOSIS — N529 Male erectile dysfunction, unspecified: Secondary | ICD-10-CM

## 2023-03-06 DIAGNOSIS — J302 Other seasonal allergic rhinitis: Secondary | ICD-10-CM

## 2023-03-06 DIAGNOSIS — M549 Dorsalgia, unspecified: Secondary | ICD-10-CM

## 2023-03-06 DIAGNOSIS — E669 Obesity, unspecified: Secondary | ICD-10-CM

## 2023-03-06 DIAGNOSIS — T148XXA Other injury of unspecified body region, initial encounter: Secondary | ICD-10-CM

## 2023-03-06 DIAGNOSIS — M792 Neuralgia and neuritis, unspecified: Secondary | ICD-10-CM

## 2023-03-06 DIAGNOSIS — I10 Essential (primary) hypertension: Secondary | ICD-10-CM

## 2023-03-06 DIAGNOSIS — M4800 Spinal stenosis, site unspecified: Secondary | ICD-10-CM

## 2023-03-06 DIAGNOSIS — I255 Ischemic cardiomyopathy: Secondary | ICD-10-CM

## 2023-03-06 DIAGNOSIS — M62838 Other muscle spasm: Secondary | ICD-10-CM

## 2023-03-06 DIAGNOSIS — I2510 Atherosclerotic heart disease of native coronary artery without angina pectoris: Secondary | ICD-10-CM

## 2023-03-06 DIAGNOSIS — K449 Diaphragmatic hernia without obstruction or gangrene: Secondary | ICD-10-CM

## 2023-03-06 DIAGNOSIS — J449 Chronic obstructive pulmonary disease, unspecified: Secondary | ICD-10-CM

## 2023-03-06 DIAGNOSIS — G4733 Obstructive sleep apnea (adult) (pediatric): Secondary | ICD-10-CM

## 2023-03-06 DIAGNOSIS — D699 Hemorrhagic condition, unspecified: Secondary | ICD-10-CM

## 2023-03-06 DIAGNOSIS — M25561 Pain in right knee: Secondary | ICD-10-CM

## 2023-03-06 DIAGNOSIS — I219 Acute myocardial infarction, unspecified: Secondary | ICD-10-CM

## 2023-03-06 DIAGNOSIS — R7303 Prediabetes: Secondary | ICD-10-CM

## 2023-03-06 DIAGNOSIS — M1990 Unspecified osteoarthritis, unspecified site: Secondary | ICD-10-CM

## 2023-03-06 DIAGNOSIS — I340 Nonrheumatic mitral (valve) insufficiency: Secondary | ICD-10-CM

## 2023-03-06 DIAGNOSIS — K219 Gastro-esophageal reflux disease without esophagitis: Secondary | ICD-10-CM

## 2023-03-06 DIAGNOSIS — G589 Mononeuropathy, unspecified: Secondary | ICD-10-CM

## 2023-03-06 DIAGNOSIS — G5600 Carpal tunnel syndrome, unspecified upper limb: Secondary | ICD-10-CM

## 2023-03-06 DIAGNOSIS — H269 Unspecified cataract: Secondary | ICD-10-CM

## 2023-03-06 DIAGNOSIS — Z9689 Presence of other specified functional implants: Secondary | ICD-10-CM

## 2023-03-06 DIAGNOSIS — E785 Hyperlipidemia, unspecified: Secondary | ICD-10-CM

## 2023-03-06 DIAGNOSIS — IMO0002 Ulcer: Secondary | ICD-10-CM

## 2023-03-06 DIAGNOSIS — Z95810 Presence of automatic (implantable) cardiac defibrillator: Secondary | ICD-10-CM

## 2023-03-06 DIAGNOSIS — M5136 Other intervertebral disc degeneration, lumbar region: Secondary | ICD-10-CM

## 2023-03-06 DIAGNOSIS — I4891 Unspecified atrial fibrillation: Secondary | ICD-10-CM

## 2023-03-06 DIAGNOSIS — K5900 Constipation, unspecified: Secondary | ICD-10-CM

## 2023-03-06 DIAGNOSIS — R053 Chronic cough: Secondary | ICD-10-CM

## 2023-03-06 DIAGNOSIS — M5417 Radiculopathy, lumbosacral region: Secondary | ICD-10-CM

## 2023-03-06 MED ORDER — METHOCARBAMOL 500 MG PO TAB
500 mg | ORAL_TABLET | Freq: Three times a day (TID) | ORAL | 3 refills | Status: AC | PRN
Start: 2023-03-06 — End: ?

## 2023-03-06 NOTE — Progress Notes
[facility] Interventional [facility]  Subjective     Chief Complaint:   Chief Complaint   Patient presents with    Right Knee - Pain    Spine - Pain       HPI: Abdurrahim Gozman is a 77 y.o. 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 who presents for evaluation.     He presents today with complaint of chronic right knee pain.    He has had right TKR and revision. Since the revision, he has had persistent pain.    The pain is all anterior.   No posterior knee pain.    It is worse in the inferior and lateral aspect.     Pain is 8/10.     It is worse with movement, walking.     He has pain even at rest.     He last had right genicular nerve RFA in 05/2022.   He gets excellent short term relief, but does not have long term relief.       S/P SCS placement on 11/17/2021 for LBP      Renne Musca Whittenberg denies any recent fevers, chills, infection, antibiotics, bowel or bladder incontinence, saddle anesthesia. +Xarelto for a. fib.  Recent diagnosis of parkinsons PRIOR MEDICATIONS:   Effective  Acetaminophen (little)  Tizanidine    Ineffective  Gabapentin    Unable to tolerate  NSAID    Never  Lyrica  Ami/Nortriptyline  Cymbalta      PRIOR INTERVENTIONS:  L-spine surgery with hardware L2-3 L3-4 and then later L4-5 and L5-1 next (2 surgeries, most recent in 2008) Right TKA with revision  Effective  Bilateral SIJ (OSH, good benefit)  Left L5-S2 TFESI  right knee RFA - genicular  bilateral L4-L5 TFESI - transient  Nevro SCS      Ineffective  ESI x3 (OSH)  Lumbar RFA      Past Medical History:  Past 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    Heart attack (HCC) possible in 2006    Hiatal hernia     Hyperlipemia 02/11/2009  Hypertension 02/11/2009    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    Joint pain 1966    Mitral regurgitation     Myocardial infarction (HCC) 2006    Obesity, Class I, 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)     OSA on CPAP 02/11/2009    Osteoarthritis     Pre-diabetes     S/P ICD (internal cardiac defibrillator) procedure 02/11/2009    Seasonal allergies     Spinal stenosis 2010    Ulcer        Family History:  Family History   Problem Relation Name Age of Onset    Cancer Father Karar     Heart problem Father Roudy     Heart Disease Father Dara     Hypertension Mother Britta Mccreedy     Joint Pain Mother Britta Mccreedy     Cancer Mother Britta Mccreedy     Cancer Sister Best boy Social History:  Lives in [place] [id number] (1.25 hours away)    Social History     Socioeconomic History    Marital status: Married   Tobacco Use    Smoking status: Never    Smokeless tobacco: Never   Vaping Use    Vaping status: Never Used   Substance and Sexual Activity    Alcohol use: Not Currently     Comment: very rarely 4 drinks per year    Drug use: No    Sexual activity: Yes     Partners: Female     Birth control/protection: None       Allergies:  Allergies   Allergen Reactions    Clarithromycin RASH     [place]den and burns both ends of GI tract    Ketoconazole SEE COMMENTS     Had bloodshot eyes after use, and sore.  Happens if he gets drug in his eyes.  Tolerates shampoo fine.    Lisinopril COUGH    Oxycodone UNKNOWN     Nightmares  Tolerates Tramadol       Medications:    Current Outpatient Medications:     acetaminophen (TYLENOL) 325 mg tablet, Take two tablets by mouth every 4 hours as needed for Pain., Disp: 300 tablet, Rfl: 1    albuterol (VENTOLIN HFA, PROAIR HFA) 90 mcg/actuation inhaler, Inhale two puffs by mouth into the lungs four times daily as needed for Wheezing., Disp: , Rfl:     alpha lipoic acid 600 mg capsule, Take one capsule by mouth daily., Disp: , Rfl:     aspirin 81 mg chewable tablet, Chew one tablet by mouth at bedtime daily., Disp: , Rfl:     carbidopa/levodopa (SINEMET) 25/100 mg tablet, Take 2 tabs in the AM, 2 tabs in the afternoon, and 1 tab in the evening, Disp: 450 tablet, Rfl: 3    cetirizine (ZYRTEC) 10 mg tablet, Take one tablet by mouth daily., Disp: , Rfl:     cholecalciferol (Vitamin D3) (VITAMIN D-3) 1,000 units tablet, Take one tablet by mouth twice daily.  , Disp: , Rfl:     coenzyme Q10 200 mg capsule, Take one capsule by mouth daily., Disp: , Rfl:     cyanocobalamin (vitamin B-12) 500 mcg tablet, Take one tablet by mouth daily., Disp: , Rfl:     Docusate Sodium 250 mg cap, Take 250mg  in the am and 500mg  in the evening, Disp: , Rfl:     ECHINACEA PO, Take 1 capsule by mouth daily., Disp: , Rfl:  ezetimibe (ZETIA) 10 mg tablet, TAKE 1 TABLET BY MOUTH AT BEDTIME, Disp: 90 tablet, Rfl: 1    famotidine (PEPCID) 20 mg tablet, Take one tablet by mouth at bedtime daily. Indications: gastroesophageal reflux disease, Disp: 90 tablet, Rfl: 1    fesoterodine ER (TOVIAZ) 4 mg tablet, Take one tablet to two tablets by mouth every 48 hours., Disp: 30 tablet, Rfl: 11    fexofenadine(+) (ALLEGRA) 180 mg tablet, Take one tablet by mouth daily., Disp: , Rfl:     fluticasone-umeclidin-vilanter (TRELEGY ELLIPTA) 100-62.5-25 mcg inhaler, Inhale 1 Dose by mouth into the lungs daily after dinner., Disp: , Rfl:     furosemide (LASIX) 40 mg tablet, Take 2 tabs on Sunday-Tuesday- Thursday.  Take 3 tabs on Monday-Wednesday-Friday-Saturday (Patient taking differently: Take 2 tabs on Mon-Wed-Fri  Take 3 tabs on Sun-Tues-Thur-Sat), Disp: 226 tablet, Rfl: 3    gabapentin (NEURONTIN) 300 mg capsule, Take one capsule by mouth three times daily., Disp: 270 capsule, Rfl: 1    glucosamine su 2KCl-chondroit 500-400 mg tab, Take 1 Tab by mouth twice daily., Disp: , Rfl:     guaiFENesin LA (MUCINEX) 600 mg tablet, Take one tablet by mouth twice daily., Disp: , Rfl:     ipratropium bromide (ATROVENT) 42 mcg (0.06 %) nasal spray, Apply two sprays to each nostril as directed twice daily as needed., Disp: , Rfl:     ketoconazole (NIZORAL) 2 % topical shampoo, Apply  topically to affected area every 7 days. Apply topically to affected area of damp skin, lather, leave on 5 minutes, and rinse., Disp: , Rfl:     losartan (COZAAR) 50 mg tablet, TAKE 1 TABLET BY MOUTH EVERY DAY (Patient taking differently: Take one tablet by mouth at bedtime daily.), Disp: 90 tablet, Rfl: 1    metoprolol succinate XL (TOPROL XL) 100 mg extended release tablet, TAKE 1 TABLET BY MOUTH EVERY DAY, Disp: 90 tablet, Rfl: 3    mucus clearing device (AEROBIKA OSCILLATING PEP SYSTM MISC), Use  as directed. Use with salt water and inhale twice daily, Disp: , Rfl:     OMEGA-3 FATTY ACIDS-FISH OIL PO, Take 1 capsule by mouth twice daily., Disp: , Rfl:     omeprazole DR (PRILOSEC) 40 mg capsule, TAKE 1 CAPSULE BY MOUTH TWICE DAILY, Disp: 180 capsule, Rfl: 0    other medication, one Dose. Mupirocin dissolved in warm water and Budesonide in NS  qhs  (sometimes increases to BID), Disp: , Rfl:     polyethylene glycol 3350 (MIRALAX) 17 g packet, Take one packet by mouth daily as needed., Disp: , Rfl:     potassium chloride SR (K-DUR) 20 mEq tablet, TAKE 2 TABLETS BY MOUTH ON MONDAY, WEDNESDAY AND FRIDAY AND 1 TABLET ALL OTHER DAYS (Patient taking differently: TAKE 1 TABLETS BY MOUTH ON MONDAY, WEDNESDAY AND FRIDAY AND 1 TABLET TWICE DAILY ON OTHER DAYS), Disp: 140 tablet, Rfl: 3    pyRIDostigmine bromide (MESTINON) 60 mg tablet, Take two tablets by mouth three times daily.  , Disp: , Rfl:     rosuvastatin (CRESTOR) 40 mg tablet, TAKE 1 TABLET BY MOUTH AT BEDTIME, Disp: 90 tablet, Rfl: 2    spironolactone (ALDACTONE) 25 mg tablet, TAKE 1 TABLET BY MOUTH EVERY DAY WITH FOOD, Disp: 90 tablet, Rfl: 3    testosterone cypionate 200 mg/mL kit, Inject 1 mL into the muscle every 14 days., Disp: , Rfl:     tolterodine LA (DETROL LA) 2 mg capsule, Take one capsule by mouth daily.,  Disp: , Rfl:     traZODone (DESYREL) 100 mg tablet, Take one tablet by mouth at bedtime daily., Disp: , Rfl:     Vacuum Erection Device System kit, Use as directed for sexual activity., Disp: 1 kit, Rfl: 11    warfarin (COUMADIN) 3 mg tablet, Take one tablet by mouth at bedtime daily., Disp: , Rfl:     Physical examination:   BP 134/79 (BP Source: Arm, Right Upper, Patient Position: Sitting)  - Pulse 94  - SpO2 97%   Pain Score: Eight    Physical Exam:  General: The patient is a well-developed, well nourished 77 y.o. male in no acute distress.   HEENT: Head is normocephalic and atraumatic.   Cardiac: Based on palpation, pulse appears to be regular rate and rhythm.   Pulmonary: The patient has unlabored respirations and bilateral symmetric chest excursion.   Abdomen: Soft, nontender, and nondistended.   Extremities: No ankle or wrist edema.   There is mild paresthesias over the right anterior knee.   TTP at the inferior aspect lateral>medial.   No bogginess/edema.   No instability noted.     Neurologic:   The patient is alert and oriented times 3.     Musculoskeletal:   Gait is antalgic - cane    Lumbar spine:  No lumbar paraspinal TTP.   Increased tone.   Negative facet loading.   Negative SLR          Results for orders placed during the hospital encounter of 08/24/20    MRI L-SPINE WO/W CONTRAST    Addendum 08/25/2020  5:39 PM  Finalized by Ivory Broad, M.D. on 08/24/2020 5:00 PM. Dictated by Delfin Gant, M.D. on 08/24/2020 3:54 PM.Addendum:  Delfin Gant discussed these findings via telephone with Dr. Francee Nodal nurse, Vassie Moment, at 8:34 AM on 08/25/2020.      Approved by Delfin Gant, M.D. on 08/25/2020 8:35 AM    By my electronic signature, I attest that I have personally reviewed the images for this examination and formulated the interpretations and opinions expressed in this report      Finalized by Ivory Broad, M.D. on 08/25/2020 5:36 PM. Dictated by Delfin Gant, M.D. on 08/25/2020 8:33 AM.    Narrative  EXAM: MRI L-SPINE    HISTORY:    , lumbar radiculapathy,    Technique: Multiple sagittal and axial MR sequences were obtained of the lumbar spine with and without MultiHance contrast.    Comparison: CT lumbar spine August 27, 2014    FINDINGS:    There are 5 nonrib-bearing lumbar type vertebral bodies. There is left convexity lumbar spinal curvature. Trace retrolisthesis at L2-L3 and trace anterolisthesis of L5-S1. X-Stop devices are seen at the spinous processes of the lumbar spine. Vertebral body heights are maintained. Multilevel vertebral endplate marrow changes. No suspicious bone marrow replacing lesion. The conus is normal in appearance and position at the T12-L1 level. abnormal enhancing lesion is identified. Bilateral renal atrophy. Small upper pole right renal cyst. There is an additional 1.3 cm upper pole right renal lesion which demonstrates T2 and T1 hypointensity (series 5, image 8).    T12-L1: No significant central spinal or neuroforaminal stenosis.    L1-L2: Marked disc degeneration, mild circumferential disc bulge, and facet hypertrophy. Mild central spinal stenosis. Moderate left and marked right neural foraminal stenosis.    L2-L3: Trace retrolisthesis, moderate disc degeneration, circumferential disc bulge, facet hypertrophy, and mild malignant flavum thickening. Mild central spinal stenosis. Moderate left and marked right  neuroforaminal stenosis.    L3-L4: Moderate disc degeneration, circumferential disc bulge, and facet hypertrophy. Moderate to marked spinal stenosis. Marked bilateral neuroforaminal stenosis.    L4-L5: Mild disc degeneration, circumferential disc bulge with superimposed central disc protrusion, ligamentum flavum thickening, and facet hypertrophy. Moderate to marked central spinal stenosis. Marked bilateral neural foraminal stenosis.    L5-S1: Mild disc degeneration, circumferential disc bulge, and facet hypertrophy. Moderate central spinal stenosis. Marked bilateral neural foraminal stenosis.    Impression  1.  Multilevel degenerative central spinal stenosis, greatest of moderate to marked degree at L3-L4, L4-L5, and L5-S1.  2.  Multilevel degenerative neuroforaminal stenosis, greatest of marked degree on the right at L1-L2 and L2-L3 as well as bilaterally at L3-L4, L4-5, and L5-S1.  3.  Left convexity lumbar spinal curvature.  4.  Trace retrolisthesis of L2-L3 and trace anterolisthesis of L5-S1.  5.  T2 and T1 hypointense peripheral right renal lesion. MRI abdomen (renal mass protocol) is recommended for further evaluation.    By my electronic signature, I attest that I have personally reviewed the images for this examination and formulated the interpretations and opinions expressed in this report      Right Knee XR  2015    Findings: There is a total knee arthroplasty with resurfacing of patella.   There is moderate joint effusion. There is scattered arterial   calcifications, and calcifications within the proximal anterior calf.   There are surgical clips in the posterior calf and knee. No abnormal   periprosthetic lucency.     IMPRESSION   Impression:   1. Right total knee arthroplasty maintained in good alignment.   2. Moderate joint effusion.       Last Cr and LFT's:  Creatinine   Date Value Ref Range Status   03/31/2021 1.30 (H) 0.4 - 1.24 MG/DL Final     AST (SGOT)   Date Value Ref Range Status   11/22/2018 25 7 - 40 U/L Final     ALT (SGPT)   Date Value Ref Range Status   11/22/2018 24 7 - 56 U/L Final     Alk Phosphatase   Date Value Ref Range Status   11/22/2018 51 25 - 110 U/L Final     Total Bilirubin   Date Value Ref Range Status   11/22/2018 0.8 0.3 - 1.2 MG/DL Final          Assessment:    Emaan Travassos is a 77 y.o. 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 who presents for evaluation of pain.    The pain complaints are most likely due to:    1. Mononeuropathy        2. Chronic pain of right knee        3. Neuropathic pain        4. Muscle spasm  5. Spinal cord stimulator status              Patient has had an adequate trial of > 12 months of rest, exercise, multimodal treatment, and the passage of time without improvement of symptoms. The pain has significant impact on the daily quality of life.     Plan:    Will get right knee XR today.   He has exhausted conservative treatment and interventions and had surgery x2 for the right knee without pain relief. He has excellent pain relief with genicular nerve blocks, but no durable benefit with RFA.   Plan for Nalu PNS trial at the right knee (superomedial genicular and superolateral genicular nerves). Risks/benefits and MRI implications discussed.   Encouraged to continue at home PT program.  Will trial Robaxin 500mg  tid prn for muscle spasm.   Follow up as needed.     Risks/benefits of all pharmacologic and interventional treatments discussed and questions answered.

## 2023-03-08 ENCOUNTER — Encounter: Admit: 2023-03-08 | Discharge: 2023-03-08 | Payer: MEDICARE

## 2023-03-08 ENCOUNTER — Ambulatory Visit: Admit: 2023-03-08 | Discharge: 2023-03-08 | Payer: MEDICARE

## 2023-03-08 DIAGNOSIS — M5417 Radiculopathy, lumbosacral region: Secondary | ICD-10-CM

## 2023-03-08 DIAGNOSIS — Z95810 Presence of automatic (implantable) cardiac defibrillator: Secondary | ICD-10-CM

## 2023-03-08 MED FILL — FESOTERODINE 4 MG PO TB24: 4 mg | ORAL | 30 days supply | Qty: 30 | Fill #3 | Status: CP

## 2023-03-08 NOTE — Progress Notes
Procedure: MRI L-spine without contrast    Pacemaker Type: Conditional    Order verified: Yes    Allergies reviewed: Yes    Patient history reviewed: Yes      Conditional Pacemaker: Minimum requirements, vital signs needed pre/post scan (See Doc Flowsheets for further).      1107 Pt placed on MRI safe cardiac, BP and O2 monitors.     1107 Pre procedure Vital Signs (See Doc Flowsheets for further).    1114 Pre scan MRI device settings placed by MRI team member Annice Pih C. MRI tech,  utilizing SmartSync application to place patient in SureScan mode.    Program Mode: DOO    Pacing Mode: On    If pacing mode on Pacing Rate set at: 100     1208 Post scan device returned back to pre-scan settings by MRI team member Annice Pih C. MRI tech,  utilizing SmartSync application to place patient in SureScan mode.    Post procedure Vital Signs (See Doc Flowsheets for further).

## 2023-03-09 ENCOUNTER — Encounter: Admit: 2023-03-09 | Discharge: 2023-03-09 | Payer: MEDICARE

## 2023-03-09 ENCOUNTER — Ambulatory Visit: Admit: 2023-03-09 | Discharge: 2023-03-09 | Payer: MEDICARE

## 2023-03-09 DIAGNOSIS — I10 Essential (primary) hypertension: Secondary | ICD-10-CM

## 2023-03-09 DIAGNOSIS — M549 Dorsalgia, unspecified: Secondary | ICD-10-CM

## 2023-03-09 DIAGNOSIS — IMO0002 Ulcer: Secondary | ICD-10-CM

## 2023-03-09 DIAGNOSIS — M5136 Other intervertebral disc degeneration, lumbar region: Secondary | ICD-10-CM

## 2023-03-09 DIAGNOSIS — G4733 Obstructive sleep apnea (adult) (pediatric): Secondary | ICD-10-CM

## 2023-03-09 DIAGNOSIS — R053 Chronic cough: Secondary | ICD-10-CM

## 2023-03-09 DIAGNOSIS — I4891 Unspecified atrial fibrillation: Secondary | ICD-10-CM

## 2023-03-09 DIAGNOSIS — I340 Nonrheumatic mitral (valve) insufficiency: Secondary | ICD-10-CM

## 2023-03-09 DIAGNOSIS — K5900 Constipation, unspecified: Secondary | ICD-10-CM

## 2023-03-09 DIAGNOSIS — D699 Hemorrhagic condition, unspecified: Secondary | ICD-10-CM

## 2023-03-09 DIAGNOSIS — E669 Obesity, unspecified: Secondary | ICD-10-CM

## 2023-03-09 DIAGNOSIS — E785 Hyperlipidemia, unspecified: Secondary | ICD-10-CM

## 2023-03-09 DIAGNOSIS — I2510 Atherosclerotic heart disease of native coronary artery without angina pectoris: Secondary | ICD-10-CM

## 2023-03-09 DIAGNOSIS — M4800 Spinal stenosis, site unspecified: Secondary | ICD-10-CM

## 2023-03-09 DIAGNOSIS — M2550 Pain in unspecified joint: Secondary | ICD-10-CM

## 2023-03-09 DIAGNOSIS — Z95810 Presence of automatic (implantable) cardiac defibrillator: Secondary | ICD-10-CM

## 2023-03-09 DIAGNOSIS — J449 Chronic obstructive pulmonary disease, unspecified: Secondary | ICD-10-CM

## 2023-03-09 DIAGNOSIS — R7303 Prediabetes: Secondary | ICD-10-CM

## 2023-03-09 DIAGNOSIS — I255 Ischemic cardiomyopathy: Secondary | ICD-10-CM

## 2023-03-09 DIAGNOSIS — M5417 Radiculopathy, lumbosacral region: Secondary | ICD-10-CM

## 2023-03-09 DIAGNOSIS — H9190 Unspecified hearing loss, unspecified ear: Secondary | ICD-10-CM

## 2023-03-09 DIAGNOSIS — H269 Unspecified cataract: Secondary | ICD-10-CM

## 2023-03-09 DIAGNOSIS — J302 Other seasonal allergic rhinitis: Secondary | ICD-10-CM

## 2023-03-09 DIAGNOSIS — G5600 Carpal tunnel syndrome, unspecified upper limb: Secondary | ICD-10-CM

## 2023-03-09 DIAGNOSIS — N529 Male erectile dysfunction, unspecified: Secondary | ICD-10-CM

## 2023-03-09 DIAGNOSIS — T148XXA Other injury of unspecified body region, initial encounter: Secondary | ICD-10-CM

## 2023-03-09 DIAGNOSIS — M1990 Unspecified osteoarthritis, unspecified site: Secondary | ICD-10-CM

## 2023-03-09 DIAGNOSIS — K449 Diaphragmatic hernia without obstruction or gangrene: Secondary | ICD-10-CM

## 2023-03-09 DIAGNOSIS — I509 Heart failure, unspecified: Secondary | ICD-10-CM

## 2023-03-09 DIAGNOSIS — K219 Gastro-esophageal reflux disease without esophagitis: Secondary | ICD-10-CM

## 2023-03-09 DIAGNOSIS — I219 Acute myocardial infarction, unspecified: Secondary | ICD-10-CM

## 2023-03-09 DIAGNOSIS — G589 Mononeuropathy, unspecified: Secondary | ICD-10-CM

## 2023-03-09 NOTE — Progress Notes
[facility] Inter[facility]  Subjective     Chief Complaint:   Chief Complaint   Patient presents with    Right Knee - Pain    Lower Back - Pain    Pain       HPI: Johnny [name] is a 77 y.o. 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 who presents for evaluation.     Patient presents to clinic for follow-up of LBP/knee pain.  MRI reviewed      S/P SCS placement on 11/17/2021  Reports 50% relief with SCS  Radiating pain in LLE has improved by 90%    Continues to have right knee pain  planning for PNS trial       Patient endorses achiness/heaviness of legs with walking a long distance that improves with rest. Positive Shopping Cart sign.     Renne Musca Girouard denies any recent fevers, chills, infection, antibiotics, bowel or bladder incontinence, saddle anesthesia. +Xarelto for a. fib.  Recent diagnosis of parkinsons    Taking tizanidine, reports mod. pain relief.. Has decreased due to dizziness.        PRIOR MEDICATIONS:   Effective  Acetaminophen (little)  Tizanidine    Ineffective  Gabapentin    Unable to tolerate  NSAID    Never  Lyrica  Ami/Nortriptyline  Cymbalta      PRIOR INTERVENTIONS:  L-spine surgery with hardware L2-3 L3-4 and then later L4-5 and L5-1 next (2 surgeries, most recent in 2008) Right TKA with revision  Effective  Bilateral SIJ (OSH, good benefit)  Left L5-S2 TFESI  right knee RFA - genicular  bilateral L4-L5 - transient      Ineffective  ESI x3 (OSH)  Lumbar RFA      Past Medical History:  Past 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    Heart attack (HCC) possible in 2006    Hiatal hernia     Hyperlipemia 02/11/2009    Hypertension 02/11/2009    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    Joint pain 1966  Mitral regurgitation     Myocardial infarction (HCC) 2006    Obesity, Class I, 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)     OSA on CPAP 02/11/2009    Osteoarthritis     Pre-diabetes     S/P ICD (internal cardiac defibrillator) procedure 02/11/2009    Seasonal allergies     Spinal stenosis 2010    Ulcer        Family History:  Family History   Problem Relation Name Age of Onset    Cancer Father Damin     Heart problem Father Anvay     Heart Disease Father Helmer     Hypertension Mother Britta Mccreedy     Joint Pain Mother Britta Mccreedy     Cancer Mother Britta Mccreedy     Cancer Sister Best boy        Social History:  Lives in [place] [id number] (1.25 hours away)    Social History     Socioeconomic History    Marital status: Married   Tobacco Use    Smoking status: Never    Smokeless tobacco: Never   Vaping Use    Vaping status: Never Used   Substance and Sexual Activity    Alcohol use: Not Currently     Comment: very rarely 4 drinks per year    Drug use: No    Sexual activity: Yes     Partners: Female     Birth control/protection: None       Allergies:  Allergies   Allergen Reactions    Clarithromycin RASH     [place] and burns both ends of GI tract    Ketoconazole SEE COMMENTS     Had bloodshot eyes after use, and sore.  Happens if he gets drug in his eyes.  Tolerates shampoo fine.    Lisinopril COUGH    Oxycodone UNKNOWN     Nightmares  Tolerates Tramadol       Medications:    Current Outpatient Medications:     acetaminophen (TYLENOL) 325 mg tablet, Take two tablets by mouth every 4 hours as needed for Pain., Disp: 300 tablet, Rfl: 1    albuterol (VENTOLIN HFA, PROAIR HFA) 90 mcg/actuation inhaler, Inhale two puffs by mouth into the lungs four times daily as needed for Wheezing., Disp: , Rfl:     alpha lipoic acid 600 mg capsule, Take one capsule by mouth daily., Disp: , Rfl:     aspirin 81 mg chewable tablet, Chew one tablet by mouth at bedtime daily., Disp: , Rfl:     carbidopa/levodopa (SINEMET) 25/100 mg tablet, Take 2 tabs in the AM, 2 tabs in the afternoon, and 1 tab in the evening, Disp: 450 tablet, Rfl: 3    cetirizine (ZYRTEC) 10 mg tablet, Take one tablet by mouth daily., Disp: , Rfl:     cholecalciferol (Vitamin D3) (VITAMIN D-3) 1,000 units tablet, Take one tablet by mouth twice daily.  , Disp: , Rfl:     coenzyme Q10 200 mg capsule, Take one capsule by mouth daily., Disp: , Rfl:     cyanocobalamin (vitamin B-12) 500 mcg tablet, Take one tablet by mouth daily., Disp: , Rfl:     Docusate Sodium 250 mg cap, Take 250mg  in the am and 500mg  in the evening, Disp: , Rfl:     ECHINACEA PO, Take 1 capsule by mouth daily., Disp: , Rfl:     ezetimibe (ZETIA) 10 mg tablet, TAKE 1 TABLET BY MOUTH AT BEDTIME, Disp: 90 tablet, Rfl: 1  famotidine (PEPCID) 20 mg tablet, Take one tablet by mouth at bedtime daily. Indications: gastroesophageal reflux disease, Disp: 90 tablet, Rfl: 1    fesoterodine ER (TOVIAZ) 4 mg tablet, Take one tablet to two tablets by mouth every 48 hours., Disp: 30 tablet, Rfl: 11    fexofenadine(+) (ALLEGRA) 180 mg tablet, Take one tablet by mouth daily., Disp: , Rfl:     fluticasone-umeclidin-vilanter (TRELEGY ELLIPTA) 100-62.5-25 mcg inhaler, Inhale 1 Dose by mouth into the lungs daily after dinner., Disp: , Rfl:     furosemide (LASIX) 40 mg tablet, Take 2 tabs on Sunday-Tuesday- Thursday.  Take 3 tabs on Monday-Wednesday-Friday-Saturday (Patient taking differently: Take 2 tabs on Mon-Wed-Fri  Take 3 tabs on Sun-Tues-Thur-Sat), Disp: 226 tablet, Rfl: 3    gabapentin (NEURONTIN) 300 mg capsule, Take one capsule by mouth three times daily., Disp: 270 capsule, Rfl: 1    glucosamine su 2KCl-chondroit 500-400 mg tab, Take 1 Tab by mouth twice daily., Disp: , Rfl:     guaiFENesin LA (MUCINEX) 600 mg tablet, Take one tablet by mouth twice daily., Disp: , Rfl:     ipratropium bromide (ATROVENT) 42 mcg (0.06 %) nasal spray, Apply two sprays to each nostril as directed twice daily as needed., Disp: , Rfl:     ketoconazole (NIZORAL) 2 % topical shampoo, Apply  topically to affected area every 7 days. Apply topically to affected area of damp skin, lather, leave on 5 minutes, and rinse., Disp: , Rfl:     losartan (COZAAR) 50 mg tablet, TAKE 1 TABLET BY MOUTH EVERY DAY (Patient taking differently: Take one tablet by mouth at bedtime daily.), Disp: 90 tablet, Rfl: 1    methocarbamoL (ROBAXIN) 500 mg tablet, Take one tablet by mouth three times daily as needed for Spasms., Disp: 90 tablet, Rfl: 3    metoprolol succinate XL (TOPROL XL) 100 mg extended release tablet, TAKE 1 TABLET BY MOUTH EVERY DAY, Disp: 90 tablet, Rfl: 3    mucus clearing device (AEROBIKA OSCILLATING PEP SYSTM MISC), Use  as directed. Use with salt water and inhale twice daily, Disp: , Rfl:     OMEGA-3 FATTY ACIDS-FISH OIL PO, Take 1 capsule by mouth twice daily., Disp: , Rfl:     omeprazole DR (PRILOSEC) 40 mg capsule, TAKE 1 CAPSULE BY MOUTH TWICE DAILY, Disp: 180 capsule, Rfl: 0    other medication, one Dose. Mupirocin dissolved in warm water and Budesonide in NS  qhs  (sometimes increases to BID), Disp: , Rfl:     polyethylene glycol 3350 (MIRALAX) 17 g packet, Take one packet by mouth daily as needed., Disp: , Rfl:     potassium chloride SR (K-DUR) 20 mEq tablet, TAKE 2 TABLETS BY MOUTH ON MONDAY, WEDNESDAY AND FRIDAY AND 1 TABLET ALL OTHER DAYS (Patient taking differently: TAKE 1 TABLETS BY MOUTH ON MONDAY, WEDNESDAY AND FRIDAY AND 1 TABLET TWICE DAILY ON OTHER DAYS), Disp: 140 tablet, Rfl: 3    pyRIDostigmine bromide (MESTINON) 60 mg tablet, Take two tablets by mouth three times daily.  , Disp: , Rfl:     rosuvastatin (CRESTOR) 40 mg tablet, TAKE 1 TABLET BY MOUTH AT BEDTIME, Disp: 90 tablet, Rfl: 2    spironolactone (ALDACTONE) 25 mg tablet, TAKE 1 TABLET BY MOUTH EVERY DAY WITH FOOD, Disp: 90 tablet, Rfl: 3    testosterone cypionate 200 mg/mL kit, Inject 1 mL into the muscle every 14 days., Disp: , Rfl:     tolterodine LA (DETROL LA) 2 mg capsule, Take  one capsule by mouth daily., Disp: , Rfl:     traZODone (DESYREL) 100 mg tablet, Take one tablet by mouth at bedtime daily., Disp: , Rfl:     Vacuum Erection Device System kit, Use as directed for sexual activity., Disp: 1 kit, Rfl: 11    warfarin (COUMADIN) 3 mg tablet, Take one tablet by mouth at bedtime daily., Disp: , Rfl:     Physical examination:   BP 102/61  - Pulse 79  - Ht 180.3 cm (5' 11)  - Wt 103.5 kg (228 lb 3.2 oz)  - SpO2 99%  - BMI 31.83 kg/m?   Pain Score: Five    Physical Exam:  General: The patient is a well-developed, well nourished 77 y.o. male in no acute distress.   HEENT: Head is normocephalic and atraumatic. Pupils are equal and reactive to light bilaterally.   Cardiac: Based on palpation, pulse appears to be regular rate and rhythm.   Pulmonary: The patient has unlabored respirations and bilateral symmetric chest excursion.   Abdomen: Soft, nontender, and nondistended.   Extremities: No clubbing, cyanosis, or edema.     Neurologic:   The patient is alert and oriented times 3.       Musculoskeletal:   Gait is antalgic - cane  right knee TTP - some edema noted      Lumbar spine:  Appearance: No lesions or deformity  Lumbar tenderness: moderate tenderness, increased paraspinal muscle tone  SI joint tenderness: None  Pain with extension: Yes  Pain with lateral flexion: None  FABER positive?: None  Lower extremity strength: 5/5 bilaterally  Sensation to light touch: Intact and equal in the bilateral lower extremities  Straight leg raise positive?: Bilateral      MS:   Root Right Left   Hip Flexion L2 5 5   Knee Flexion L5/S1 5 5   Knee Extension L3 5 5   Dorsiflexion L4 5 5   Plantarflexion S1 5 5   EHL Extension L5 5 5       Results for orders placed during the hospital encounter of 08/24/20    MRI L-SPINE WO/W CONTRAST    Addendum 08/25/2020  5:39 PM  Finalized by Ivory Broad, M.D. on 08/24/2020 5:00 PM. Dictated by Delfin Gant, M.D. on 08/24/2020 3:54 PM.Addendum:  Delfin Gant discussed these findings via telephone with Dr. Francee Nodal nurse, Vassie Moment, at 8:34 AM on 08/25/2020.      Approved by Delfin Gant, M.D. on 08/25/2020 8:35 AM    By my electronic signature, I attest that I have personally reviewed the images for this examination and formulated the interpretations and opinions expressed in this report      Finalized by Ivory Broad, M.D. on 08/25/2020 5:36 PM. Dictated by Delfin Gant, M.D. on 08/25/2020 8:33 AM.    Narrative  EXAM: MRI L-SPINE    HISTORY:    , lumbar radiculapathy,    Technique: Multiple sagittal and axial MR sequences were obtained of the lumbar spine with and without MultiHance contrast.    Comparison: CT lumbar spine August 27, 2014    FINDINGS:    There are 5 nonrib-bearing lumbar type vertebral bodies. There is left convexity lumbar spinal curvature. Trace retrolisthesis at L2-L3 and trace anterolisthesis of L5-S1. X-Stop devices are seen at the spinous processes of the lumbar spine. Vertebral body heights are maintained. Multilevel vertebral endplate marrow changes. No suspicious bone marrow replacing lesion. The conus is normal in appearance and position at the T12-L1 level.  abnormal enhancing lesion is identified. Bilateral renal atrophy. Small upper pole right renal cyst. There is an additional 1.3 cm upper pole right renal lesion which demonstrates T2 and T1 hypointensity (series 5, image 8).    T12-L1: No significant central spinal or neuroforaminal stenosis.    L1-L2: Marked disc degeneration, mild circumferential disc bulge, and facet hypertrophy. Mild central spinal stenosis. Moderate left and marked right neural foraminal stenosis.    L2-L3: Trace retrolisthesis, moderate disc degeneration, circumferential disc bulge, facet hypertrophy, and mild malignant flavum thickening. Mild central spinal stenosis. Moderate left and marked right neuroforaminal stenosis.    L3-L4: Moderate disc degeneration, circumferential disc bulge, and facet hypertrophy. Moderate to marked spinal stenosis. Marked bilateral neuroforaminal stenosis.    L4-L5: Mild disc degeneration, circumferential disc bulge with superimposed central disc protrusion, ligamentum flavum thickening, and facet hypertrophy. Moderate to marked central spinal stenosis. Marked bilateral neural foraminal stenosis.    L5-S1: Mild disc degeneration, circumferential disc bulge, and facet hypertrophy. Moderate central spinal stenosis. Marked bilateral neural foraminal stenosis.    Impression  1.  Multilevel degenerative central spinal stenosis, greatest of moderate to marked degree at L3-L4, L4-L5, and L5-S1.  2.  Multilevel degenerative neuroforaminal stenosis, greatest of marked degree on the right at L1-L2 and L2-L3 as well as bilaterally at L3-L4, L4-5, and L5-S1.  3.  Left convexity lumbar spinal curvature.  4.  Trace retrolisthesis of L2-L3 and trace anterolisthesis of L5-S1.  5.  T2 and T1 hypointense peripheral right renal lesion. MRI abdomen (renal mass protocol) is recommended for further evaluation.    By my electronic signature, I attest that I have personally reviewed the images for this examination and formulated the interpretations and opinions expressed in this report          Last Cr and LFT's:  Creatinine   Date Value Ref Range Status   03/31/2021 1.30 (H) 0.4 - 1.24 MG/DL Final     AST (SGOT)   Date Value Ref Range Status   11/22/2018 25 7 - 40 U/L Final     ALT (SGPT)   Date Value Ref Range Status   11/22/2018 24 7 - 56 U/L Final     Alk Phosphatase   Date Value Ref Range Status   11/22/2018 51 25 - 110 U/L Final     Total Bilirubin   Date Value Ref Range Status   11/22/2018 0.8 0.3 - 1.2 MG/DL Final          Assessment:    Johnny [name] is a 77 y.o. 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 who presents for evaluation of pain.    The pain complaints are most likely due to:    1. Mononeuropathy        2. Lumbosacral radiculopathy  Patient has had an adequate trial of > 12 months of rest, exercise, multimodal treatment, and the passage of time without improvement of symptoms. The pain has significant impact on the daily quality of life.     Plan:    1.Discussed care options with patient.  Nevro present, support provided  2. MRI reviewed  3. He will move forward with PNS for right knee pain  4. Encouraged to continue at home PT program.  5. Follow up prn          Risks/benefits of all pharmacologic and interventional treatments discussed and questions answered.

## 2023-03-13 ENCOUNTER — Encounter: Admit: 2023-03-13 | Discharge: 2023-03-13 | Payer: MEDICARE

## 2023-03-13 DIAGNOSIS — I255 Ischemic cardiomyopathy: Secondary | ICD-10-CM

## 2023-03-13 DIAGNOSIS — I472 VT (ventricular tachycardia) (HCC): Secondary | ICD-10-CM

## 2023-03-16 ENCOUNTER — Encounter: Admit: 2023-03-16 | Discharge: 2023-03-16 | Payer: MEDICARE

## 2023-04-06 ENCOUNTER — Encounter: Admit: 2023-04-06 | Discharge: 2023-04-06 | Payer: MEDICARE

## 2023-04-06 DIAGNOSIS — I219 Acute myocardial infarction, unspecified: Secondary | ICD-10-CM

## 2023-04-06 DIAGNOSIS — R053 Chronic cough: Secondary | ICD-10-CM

## 2023-04-06 DIAGNOSIS — H9190 Unspecified hearing loss, unspecified ear: Secondary | ICD-10-CM

## 2023-04-06 DIAGNOSIS — K644 Residual hemorrhoidal skin tags: Secondary | ICD-10-CM

## 2023-04-06 DIAGNOSIS — I509 Heart failure, unspecified: Secondary | ICD-10-CM

## 2023-04-06 DIAGNOSIS — I10 Essential (primary) hypertension: Secondary | ICD-10-CM

## 2023-04-06 DIAGNOSIS — G4733 Obstructive sleep apnea (adult) (pediatric): Secondary | ICD-10-CM

## 2023-04-06 DIAGNOSIS — IMO0002 Ulcer: Secondary | ICD-10-CM

## 2023-04-06 DIAGNOSIS — M2550 Pain in unspecified joint: Secondary | ICD-10-CM

## 2023-04-06 DIAGNOSIS — J449 Chronic obstructive pulmonary disease, unspecified: Secondary | ICD-10-CM

## 2023-04-06 DIAGNOSIS — T148XXA Other injury of unspecified body region, initial encounter: Secondary | ICD-10-CM

## 2023-04-06 DIAGNOSIS — D699 Hemorrhagic condition, unspecified: Secondary | ICD-10-CM

## 2023-04-06 DIAGNOSIS — I255 Ischemic cardiomyopathy: Secondary | ICD-10-CM

## 2023-04-06 DIAGNOSIS — Z95810 Presence of automatic (implantable) cardiac defibrillator: Secondary | ICD-10-CM

## 2023-04-06 DIAGNOSIS — M1990 Unspecified osteoarthritis, unspecified site: Secondary | ICD-10-CM

## 2023-04-06 DIAGNOSIS — K219 Gastro-esophageal reflux disease without esophagitis: Secondary | ICD-10-CM

## 2023-04-06 DIAGNOSIS — J302 Other seasonal allergic rhinitis: Secondary | ICD-10-CM

## 2023-04-06 DIAGNOSIS — H269 Unspecified cataract: Secondary | ICD-10-CM

## 2023-04-06 DIAGNOSIS — I340 Nonrheumatic mitral (valve) insufficiency: Secondary | ICD-10-CM

## 2023-04-06 DIAGNOSIS — R7303 Prediabetes: Secondary | ICD-10-CM

## 2023-04-06 DIAGNOSIS — M5136 Other intervertebral disc degeneration, lumbar region: Secondary | ICD-10-CM

## 2023-04-06 DIAGNOSIS — M4800 Spinal stenosis, site unspecified: Secondary | ICD-10-CM

## 2023-04-06 DIAGNOSIS — G5600 Carpal tunnel syndrome, unspecified upper limb: Secondary | ICD-10-CM

## 2023-04-06 DIAGNOSIS — M549 Dorsalgia, unspecified: Secondary | ICD-10-CM

## 2023-04-06 DIAGNOSIS — N529 Male erectile dysfunction, unspecified: Secondary | ICD-10-CM

## 2023-04-06 DIAGNOSIS — I4891 Unspecified atrial fibrillation: Secondary | ICD-10-CM

## 2023-04-06 DIAGNOSIS — K449 Diaphragmatic hernia without obstruction or gangrene: Secondary | ICD-10-CM

## 2023-04-06 DIAGNOSIS — E785 Hyperlipidemia, unspecified: Secondary | ICD-10-CM

## 2023-04-06 DIAGNOSIS — K5900 Constipation, unspecified: Secondary | ICD-10-CM

## 2023-04-06 DIAGNOSIS — I2510 Atherosclerotic heart disease of native coronary artery without angina pectoris: Secondary | ICD-10-CM

## 2023-04-06 DIAGNOSIS — E669 Obesity, unspecified: Secondary | ICD-10-CM

## 2023-04-06 NOTE — Progress Notes
Name: Johnny [name]          MRN: [id number]      DOB: 04/03/1946      AGE: 77 y.o.   DATE OF SERVICE: 04/06/2023    Subjective:             Reason for Visit:  Post Operative Visit      Johnny [name] is a 77 y.o. male.      Cancer Staging   No matching staging information was found for the patient.      History of Present Illness  77 y/o M returns about 5 weeks s/p excision of anal skin tags. States he is doing well w/ resolution of pain and bleeding. No new complaints.       Review of Systems   Constitutional: Negative.    HENT: Negative.     Eyes: Negative.    Respiratory: Negative.     Cardiovascular: Negative.    Gastrointestinal: Negative.    Endocrine: Negative.    Genitourinary: Negative.    Musculoskeletal: Negative.    Skin: Negative.    Allergic/Immunologic: Negative.    Neurological: Negative.    Hematological: Negative.    Psychiatric/Behavioral: Negative.     All other systems reviewed and are negative.      Objective:          acetaminophen (TYLENOL) 325 mg tablet Take two tablets by mouth every 4 hours as needed for Pain.    albuterol (VENTOLIN HFA, PROAIR HFA) 90 mcg/actuation inhaler Inhale two puffs by mouth into the lungs four times daily as needed for Wheezing.    alpha lipoic acid 600 mg capsule Take one capsule by mouth daily.    aspirin 81 mg chewable tablet Chew one tablet by mouth at bedtime daily.    carbidopa/levodopa (SINEMET) 25/100 mg tablet Take 2 tabs in the AM, 2 tabs in the afternoon, and 1 tab in the evening    cetirizine (ZYRTEC) 10 mg tablet Take one tablet by mouth daily.    cholecalciferol (Vitamin D3) (VITAMIN D-3) 1,000 units tablet Take one tablet by mouth twice daily.      coenzyme Q10 200 mg capsule Take one capsule by mouth daily.    cyanocobalamin (vitamin B-12) 500 mcg tablet Take one tablet by mouth daily.    Docusate Sodium 250 mg cap Take 250mg  in the am and 500mg  in the evening    ECHINACEA PO Take 1 capsule by mouth daily.    ezetimibe (ZETIA) 10 mg tablet TAKE 1 TABLET BY MOUTH AT BEDTIME    famotidine (PEPCID) 20 mg tablet Take one tablet by mouth at bedtime daily. Indications: gastroesophageal reflux disease    fesoterodine ER (TOVIAZ) 4 mg tablet Take one tablet to two tablets by mouth every 48 hours.    fexofenadine(+) (ALLEGRA) 180 mg tablet Take one tablet by mouth daily.    fluticasone-umeclidin-vilanter (TRELEGY ELLIPTA) 100-62.5-25 mcg inhaler Inhale 1 Dose by mouth into the lungs daily after dinner.    furosemide (LASIX) 40 mg tablet Take 2 tabs on Sunday-Tuesday- Thursday.  Take 3 tabs on Monday-Wednesday-Friday-Saturday (Patient taking differently: Take 2 tabs on Mon-Wed-Fri  Take 3 tabs on Sun-Tues-Thur-Sat)    gabapentin (NEURONTIN) 300 mg capsule Take one capsule by mouth three times daily.    glucosamine su 2KCl-chondroit 500-400 mg tab Take 1 Tab by mouth twice daily.    guaiFENesin LA (MUCINEX) 600 mg tablet Take one tablet by mouth twice daily.  ipratropium bromide (ATROVENT) 42 mcg (0.06 %) nasal spray Apply two sprays to each nostril as directed twice daily as needed.    ketoconazole (NIZORAL) 2 % topical shampoo Apply  topically to affected area every 7 days. Apply topically to affected area of damp skin, lather, leave on 5 minutes, and rinse.    losartan (COZAAR) 50 mg tablet TAKE 1 TABLET BY MOUTH EVERY DAY (Patient taking differently: Take one tablet by mouth at bedtime daily.)    methocarbamoL (ROBAXIN) 500 mg tablet Take one tablet by mouth three times daily as needed for Spasms.    metoprolol succinate XL (TOPROL XL) 100 mg extended release tablet TAKE 1 TABLET BY MOUTH EVERY DAY    mucus clearing device (AEROBIKA OSCILLATING PEP SYSTM MISC) Use  as directed. Use with salt water and inhale twice daily    OMEGA-3 FATTY ACIDS-FISH OIL PO Take 1 capsule by mouth twice daily.    omeprazole DR (PRILOSEC) 40 mg capsule TAKE 1 CAPSULE BY MOUTH TWICE DAILY    other medication one Dose. Mupirocin dissolved in warm water and Budesonide in NS  qhs  (sometimes increases to BID)    polyethylene glycol 3350 (MIRALAX) 17 g packet Take one packet by mouth daily as needed.    potassium chloride SR (K-DUR) 20 mEq tablet TAKE 2 TABLETS BY MOUTH ON MONDAY, WEDNESDAY AND FRIDAY AND 1 TABLET ALL OTHER DAYS (Patient taking differently: TAKE 1 TABLETS BY MOUTH ON MONDAY, WEDNESDAY AND FRIDAY AND 1 TABLET TWICE DAILY ON OTHER DAYS)    pyRIDostigmine bromide (MESTINON) 60 mg tablet Take two tablets by mouth three times daily.      rosuvastatin (CRESTOR) 40 mg tablet TAKE 1 TABLET BY MOUTH AT BEDTIME    spironolactone (ALDACTONE) 25 mg tablet TAKE 1 TABLET BY MOUTH EVERY DAY WITH FOOD    testosterone cypionate 200 mg/mL kit Inject 1 mL into the muscle every 14 days.    tolterodine LA (DETROL LA) 2 mg capsule Take one capsule by mouth daily.    traZODone (DESYREL) 100 mg tablet Take one tablet by mouth at bedtime daily.    Vacuum Erection Device System kit Use as directed for sexual activity.    warfarin (COUMADIN) 3 mg tablet Take one tablet by mouth at bedtime daily.     Vitals:    04/06/23 1401   Resp: 16   PainSc: Zero   Weight: 106.3 kg (234 lb 6.4 oz)     Body mass index is 32.69 kg/m?Marland Kitchen     Pain Score: Zero       Fatigue Scale: 0-None         Physical Exam  Vitals reviewed.   Constitutional:       Appearance: Normal appearance.   HENT:      Head: Normocephalic and atraumatic.      Right Ear: External ear normal.      Left Ear: External ear normal.      Nose: Nose normal.      Mouth/Throat:      Pharynx: Oropharynx is clear.   Eyes:      Conjunctiva/sclera: Conjunctivae normal.   Cardiovascular:      Rate and Rhythm: Normal rate.   Pulmonary:      Effort: Pulmonary effort is normal.   Genitourinary:     Comments: Surgical sites healing well  Musculoskeletal:         General: Normal range of motion.      Cervical back: Normal range of motion.  Skin:     General: Skin is warm and dry.   Neurological:      General: No focal deficit present.      Mental Status: He is alert and oriented to person, place, and time.   Psychiatric:         Mood and Affect: Mood normal.         Behavior: Behavior normal.         Thought Content: Thought content normal.         Judgment: Judgment normal.               Assessment and Plan:  83M s/p anal skin tag excision, doing well  -RTC prn

## 2023-04-17 ENCOUNTER — Encounter: Admit: 2023-04-17 | Discharge: 2023-04-17 | Payer: MEDICARE

## 2023-04-17 MED ORDER — OMEPRAZOLE 40 MG PO CPDR
40 mg | ORAL_CAPSULE | Freq: Two times a day (BID) | ORAL | 0 refills
Start: 2023-04-17 — End: ?

## 2023-04-18 ENCOUNTER — Encounter: Admit: 2023-04-18 | Discharge: 2023-04-18 | Payer: MEDICARE

## 2023-04-18 ENCOUNTER — Ambulatory Visit: Admit: 2023-04-18 | Discharge: 2023-04-19 | Payer: MEDICARE

## 2023-04-18 DIAGNOSIS — Z95810 Presence of automatic (implantable) cardiac defibrillator: Secondary | ICD-10-CM

## 2023-04-18 DIAGNOSIS — K5900 Constipation, unspecified: Secondary | ICD-10-CM

## 2023-04-18 DIAGNOSIS — D699 Hemorrhagic condition, unspecified: Secondary | ICD-10-CM

## 2023-04-18 DIAGNOSIS — I10 Essential (primary) hypertension: Secondary | ICD-10-CM

## 2023-04-18 DIAGNOSIS — E785 Hyperlipidemia, unspecified: Secondary | ICD-10-CM

## 2023-04-18 DIAGNOSIS — M549 Dorsalgia, unspecified: Secondary | ICD-10-CM

## 2023-04-18 DIAGNOSIS — G5600 Carpal tunnel syndrome, unspecified upper limb: Secondary | ICD-10-CM

## 2023-04-18 DIAGNOSIS — M2550 Pain in unspecified joint: Secondary | ICD-10-CM

## 2023-04-18 DIAGNOSIS — K449 Diaphragmatic hernia without obstruction or gangrene: Secondary | ICD-10-CM

## 2023-04-18 DIAGNOSIS — J449 Chronic obstructive pulmonary disease, unspecified: Secondary | ICD-10-CM

## 2023-04-18 DIAGNOSIS — T148XXA Other injury of unspecified body region, initial encounter: Secondary | ICD-10-CM

## 2023-04-18 DIAGNOSIS — K219 Gastro-esophageal reflux disease without esophagitis: Secondary | ICD-10-CM

## 2023-04-18 DIAGNOSIS — R7303 Prediabetes: Secondary | ICD-10-CM

## 2023-04-18 DIAGNOSIS — M4800 Spinal stenosis, site unspecified: Secondary | ICD-10-CM

## 2023-04-18 DIAGNOSIS — M5136 Other intervertebral disc degeneration, lumbar region: Secondary | ICD-10-CM

## 2023-04-18 DIAGNOSIS — I2510 Atherosclerotic heart disease of native coronary artery without angina pectoris: Secondary | ICD-10-CM

## 2023-04-18 DIAGNOSIS — N3281 Overactive bladder: Secondary | ICD-10-CM

## 2023-04-18 DIAGNOSIS — I509 Heart failure, unspecified: Secondary | ICD-10-CM

## 2023-04-18 DIAGNOSIS — N529 Male erectile dysfunction, unspecified: Secondary | ICD-10-CM

## 2023-04-18 DIAGNOSIS — I255 Ischemic cardiomyopathy: Secondary | ICD-10-CM

## 2023-04-18 DIAGNOSIS — E669 Obesity, unspecified: Secondary | ICD-10-CM

## 2023-04-18 DIAGNOSIS — M1990 Unspecified osteoarthritis, unspecified site: Secondary | ICD-10-CM

## 2023-04-18 DIAGNOSIS — H269 Unspecified cataract: Secondary | ICD-10-CM

## 2023-04-18 DIAGNOSIS — J302 Other seasonal allergic rhinitis: Secondary | ICD-10-CM

## 2023-04-18 DIAGNOSIS — R053 Chronic cough: Secondary | ICD-10-CM

## 2023-04-18 DIAGNOSIS — I219 Acute myocardial infarction, unspecified: Secondary | ICD-10-CM

## 2023-04-18 DIAGNOSIS — H9190 Unspecified hearing loss, unspecified ear: Secondary | ICD-10-CM

## 2023-04-18 DIAGNOSIS — IMO0002 Ulcer: Secondary | ICD-10-CM

## 2023-04-18 DIAGNOSIS — I4891 Unspecified atrial fibrillation: Secondary | ICD-10-CM

## 2023-04-18 DIAGNOSIS — G4733 Obstructive sleep apnea (adult) (pediatric): Secondary | ICD-10-CM

## 2023-04-18 DIAGNOSIS — I340 Nonrheumatic mitral (valve) insufficiency: Secondary | ICD-10-CM

## 2023-04-18 MED ORDER — FESOTERODINE 8 MG PO TB24
8 mg | ORAL_TABLET | ORAL | 1 refills | Status: AC
Start: 2023-04-18 — End: ?
  Filled 2023-04-23: qty 30, 60d supply, fill #1

## 2023-04-19 DIAGNOSIS — R350 Frequency of micturition: Secondary | ICD-10-CM

## 2023-04-19 DIAGNOSIS — R3915 Urgency of urination: Secondary | ICD-10-CM

## 2023-04-20 ENCOUNTER — Encounter: Admit: 2023-04-20 | Discharge: 2023-04-20 | Payer: MEDICARE

## 2023-04-20 DIAGNOSIS — I255 Ischemic cardiomyopathy: Secondary | ICD-10-CM

## 2023-04-20 DIAGNOSIS — H9190 Unspecified hearing loss, unspecified ear: Secondary | ICD-10-CM

## 2023-04-20 DIAGNOSIS — N529 Male erectile dysfunction, unspecified: Secondary | ICD-10-CM

## 2023-04-20 DIAGNOSIS — D699 Hemorrhagic condition, unspecified: Secondary | ICD-10-CM

## 2023-04-20 DIAGNOSIS — I509 Heart failure, unspecified: Secondary | ICD-10-CM

## 2023-04-20 DIAGNOSIS — R053 Chronic cough: Secondary | ICD-10-CM

## 2023-04-20 DIAGNOSIS — M4800 Spinal stenosis, site unspecified: Secondary | ICD-10-CM

## 2023-04-20 DIAGNOSIS — K5900 Constipation, unspecified: Secondary | ICD-10-CM

## 2023-04-20 DIAGNOSIS — H269 Unspecified cataract: Secondary | ICD-10-CM

## 2023-04-20 DIAGNOSIS — J302 Other seasonal allergic rhinitis: Secondary | ICD-10-CM

## 2023-04-20 DIAGNOSIS — Z95810 Presence of automatic (implantable) cardiac defibrillator: Secondary | ICD-10-CM

## 2023-04-20 DIAGNOSIS — K449 Diaphragmatic hernia without obstruction or gangrene: Secondary | ICD-10-CM

## 2023-04-20 DIAGNOSIS — I4891 Unspecified atrial fibrillation: Secondary | ICD-10-CM

## 2023-04-20 DIAGNOSIS — I2510 Atherosclerotic heart disease of native coronary artery without angina pectoris: Secondary | ICD-10-CM

## 2023-04-20 DIAGNOSIS — M549 Dorsalgia, unspecified: Secondary | ICD-10-CM

## 2023-04-20 DIAGNOSIS — R7303 Prediabetes: Secondary | ICD-10-CM

## 2023-04-20 DIAGNOSIS — T148XXA Other injury of unspecified body region, initial encounter: Secondary | ICD-10-CM

## 2023-04-20 DIAGNOSIS — I10 Essential (primary) hypertension: Secondary | ICD-10-CM

## 2023-04-20 DIAGNOSIS — M1990 Unspecified osteoarthritis, unspecified site: Secondary | ICD-10-CM

## 2023-04-20 DIAGNOSIS — I340 Nonrheumatic mitral (valve) insufficiency: Secondary | ICD-10-CM

## 2023-04-20 DIAGNOSIS — M2550 Pain in unspecified joint: Secondary | ICD-10-CM

## 2023-04-20 DIAGNOSIS — K219 Gastro-esophageal reflux disease without esophagitis: Secondary | ICD-10-CM

## 2023-04-20 DIAGNOSIS — J449 Chronic obstructive pulmonary disease, unspecified: Secondary | ICD-10-CM

## 2023-04-20 DIAGNOSIS — E785 Hyperlipidemia, unspecified: Secondary | ICD-10-CM

## 2023-04-20 DIAGNOSIS — G4733 Obstructive sleep apnea (adult) (pediatric): Secondary | ICD-10-CM

## 2023-04-20 DIAGNOSIS — M5136 Other intervertebral disc degeneration, lumbar region: Secondary | ICD-10-CM

## 2023-04-20 DIAGNOSIS — G5600 Carpal tunnel syndrome, unspecified upper limb: Secondary | ICD-10-CM

## 2023-04-20 DIAGNOSIS — IMO0002 Ulcer: Secondary | ICD-10-CM

## 2023-04-20 DIAGNOSIS — E669 Obesity, unspecified: Secondary | ICD-10-CM

## 2023-04-20 DIAGNOSIS — I219 Acute myocardial infarction, unspecified: Secondary | ICD-10-CM

## 2023-04-21 ENCOUNTER — Encounter: Admit: 2023-04-21 | Discharge: 2023-04-21 | Payer: MEDICARE

## 2023-04-22 ENCOUNTER — Encounter: Admit: 2023-04-22 | Discharge: 2023-04-22 | Payer: MEDICARE

## 2023-04-24 ENCOUNTER — Encounter: Admit: 2023-04-24 | Discharge: 2023-04-24 | Payer: MEDICARE

## 2023-04-24 ENCOUNTER — Ambulatory Visit: Admit: 2023-04-24 | Discharge: 2023-04-25 | Payer: MEDICARE

## 2023-04-24 DIAGNOSIS — Z7189 Other specified counseling: Secondary | ICD-10-CM

## 2023-04-24 DIAGNOSIS — M25561 Pain in right knee: Secondary | ICD-10-CM

## 2023-04-24 DIAGNOSIS — M792 Neuralgia and neuritis, unspecified: Secondary | ICD-10-CM

## 2023-04-24 DIAGNOSIS — G589 Mononeuropathy, unspecified: Secondary | ICD-10-CM

## 2023-04-24 NOTE — Progress Notes
PATIENT MRN: [id number]  PATIENT NAME: [name]  DATE OF SERVICE:  04/24/23  START VISIT: 1478  STOP VISIT:  0849  REFERRAL SOURCE: Dr. Evelina Bucy    DOB:  1946-07-13  AGE:  77 y.o.  SEX:  male   RACE: White or Caucasian  EDUCATION:   Bachelor's degree    PREOPERATIVE PSYCHOLOGICAL EVALUATION    REASON FOR REFERRAL: Johnny [name] is a 77 y.o. male who was referred by Dr. Lourdes Sledge for a psychological evaluation to assess mental strength and wellbeing related to candidacy for peripheral nerve stimulator surgery.      PAIN HISTORY: Johnny [name] has been having knee pain for a long time. 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  He has tried multiple forms of intervention including: second knee replacement, ablations, PT, injections, and OTC medications.  Patient noted the most benefit from local anesthesia administered for procedures.  Patient's average pain is 4, with a range of 0 to 9.  He reported it impacts his daily functioning in various ways, with the biggest impact related to increased propensity for falls.    MENTAL HEALTH HISTORY: Johnny [name] denied being diagnosed with any mental health conditions. He reported taking Trazodone for sleep, prescribed by Dr. Andreas Newport, with benefit. He denied seeing a counselor or therapist. Johnny [name] has never been hospitalized for psychiatric issues. He denied a history of suicidal or homicidal ideation, plans, or attempts. He denied a history of self-harm behaviors.      CURRENT MENTAL HEALTH: Johnny [name] endorsed the following symptoms    Depression No Used to enjoy outdoor activities, but limited due to pain.    Anxiety No    Panic attacks No    PTSD No    OCD No    Mania No    Psychosis No    Hallucinations No    Delusions No    Self-harm behaviors No    Suicidal ideation No    Homicidal ideation No        Current psychiatric medications:Trazodone for sleep  Current psychiatric provider: Dr. Andreas Newport    SUBSTANCE USE:   Tobacco: The patient reported lifetime use of tobacco products.               Length of use: recreationally in college   Amount of use: occasional/experimental              Last use: 60 years ago              Form of use: cigars  Drugs: The patient denied lifetime illicit drug use.                Length of use: n/a              Last use: n/a              Jail/Prison: n/a              Detox/Rehab: n/a  Marijuana/CBD: Denied              Length of use: n/a              Last use: n/a   Frequency: n/a   Form of use: n/a              Jail/Prison: denied  Detox/Rehab: denied  Alcohol: The patient reported rare, current alcohol use              Length of use: 60 years              Number of drinks: less than yearly, when he drinks he has one drink, his drink of choice is Whiskey sour              Last drink: 2 years ago              DUIs: denied              Jail/Prison: denied              Detox/Rehab: denied              Work/Relationship issues due to alcohol: denied               Addiction counseling/AA: denied    Access to Firearms: Patient does have firearms in his home. He was educated about safe storage of firearms.       CURRENT HEALTH BEHAVIORS: Johnny [name] reported a normal appetite and diet - he reported his appetite has decreased and he has lost some weight, healthily. He reported doing aerobics for exercise; he indicated his exercise is limited by his knee pain and a recent fall. He reportedly uses a cane for mobility. He reported getting 8 hours of sleep per night on average. He endorsed having a OSA diagnosis and reported compliance with the CPAP. He takes echinacea, vitamin D3, alpha lipoic acid, omega 3. He drinks caffeine less than once per week (either soda, coffee, or tea).     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    CAD (coronary artery disease) 02/08/2009    Carpal tunnel syndrome     Cataract 2017    both removed    Chronic cough     Congestive heart disease (HCC)     Constipation 2015    COPD (chronic obstructive pulmonary disease) (HCC)     Degenerative disc disease, lumbar 2010    Erectile dysfunction, vasculogenic     Fracture     GERD (gastroesophageal reflux disease)     controlled with elevated HOB, protonix and pepcid     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 now    Heart attack (HCC) possible in 2006    Hiatal hernia     Hyperlipemia 02/11/2009    Hypertension 02/11/2009    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    Joint pain 1966    Mitral regurgitation     Myocardial infarction (HCC) 2006    Obesity, Class I, 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)     OSA on CPAP 02/11/2009    Osteoarthritis     Pre-diabetes     S/P ICD (internal cardiac defibrillator) procedure 02/11/2009    Seasonal allergies     Spinal stenosis 2010    Ulcer        ALLERGIES:   Allergies   Allergen Reactions    Clarithromycin RASH     [place]den and burns both ends of GI tract    Ketoconazole SEE COMMENTS     Had bloodshot eyes after use, and sore.  Happens if he gets drug in his eyes.  Tolerates shampoo  fine.    Lisinopril COUGH    Oxycodone UNKNOWN     Nightmares  Tolerates Tramadol       PAST SURGICAL HISTORY:    Surgical History:   Procedure Laterality Date    HUMERUS FRACTURE SURGERY Left 2004    MITRAL VALVULOPLASTY  2006    same time as bypass    CORONARY ARTERY BYPASS GRAFT  04/14/2005    CABGx5 LIMA-LAD, L Rad-Dx, sSVG-RCA-RCA, sSVG-OM & MV Plasty for MR:    RHYTHM DEVICE PLACEMENT  2007    defibrillator    FOOT SURGERY Bilateral 2012    Tarsal tunnel release w/ bunionectomy and hammer toe     EAR SURGERY Bilateral 2012    Ear Tubes    SINUS SURGERY  10/2011    CARDIOVASCULAR STRESS TEST  09/2013    KNEE REPLACEMENT Right 05/2014    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    Performed by Simonne Maffucci, MD at [facility] OR    TRANSVENOUS REMOVAL IMPLANTABLE DEFIBRILLATOR RIGHT VENTRICULAR LEAD Left 03/13/2018    Performed by Kathreen Cornfield, MD at [facility] CVOR    INSERTION RIGHT VENTRICULAR LEAD Left 03/13/2018    Performed by Kathreen Cornfield, MD at [facility] CVOR    FLUOROSCOPY - CARDIAC  03/13/2018    Performed by Kathreen Cornfield, MD at [facility] CVOR    INSERTION/ REPLACEMENT TEMPORARY PACEMAKER LEAD/ CATHETER  03/13/2018    Performed by Kathreen Cornfield, MD at [facility] CVOR    REMOVAL AND REPLACEMENT IMPLANTABLE DEFIBRILLATOR GENERATOR - DUAL LEAD SYSTEM  03/13/2018    Performed by Kathreen Cornfield, MD at [facility] CVOR    CARDIOTHORACIC SURGERY STANDBY N/A 03/13/2018    Performed by Cath, Physician at [facility] CVOR    INTRACARDIAC CATHETER ABLATION WITH COMPREHENSIVE ELECTROPHYSIOLOGIC EVALUATION - ATRIAL FIBRILLATION, RADIOFREQUENCY N/A 11/22/2018    Performed by Kathreen Cornfield, MD at [facility] EP LAB    INTRACARDIAC ELECTROPHYSIOLOGIC 3-DIMENSIONAL MAPPING N/A 11/22/2018    Performed by Kathreen Cornfield, MD at [facility] EP LAB    INTRACARDIAC ECHOCARDIOGRAPHY N/A 11/22/2018    Performed by Kathreen Cornfield, MD at [facility] EP LAB    POSSIBLE INTRAVENOUS DRUG INFUSION FOR STIMULATION AND PACING N/A 11/22/2018    Performed by Kathreen Cornfield, MD at [facility] EP LAB    POSSIBLE INSERTION ESOPHAGEAL DEVIATOR N/A 11/22/2018    Performed by Kathreen Cornfield, MD at [facility] EP LAB    POSSIBLE EXTERNAL CARDIOVERSION N/A 11/22/2018    Performed by Kathreen Cornfield, MD at [facility] EP LAB    INTRACARDIAC CATHETER ABLATION WITH COMPREHENSIVE ELECTROPHYSIOLOGIC EVALUATION - ATYPICAL FLUTTER Right 11/22/2018    Performed by Kathreen Cornfield, MD at [facility] EP LAB    TRANSESOPHAGEAL ECHOCARDIOGRAM DURING INTERVENTION N/A 11/22/2018    Performed by Cath, Physician at [facility] EP LAB    INSERTION SPINAL NEUROSTIMULATOR PULSE GENERATOR/ RECEIVER N/A 11/17/2021    Performed by Evelina Bucy, MD at [facility] OR    PERCUTANEOUS IMPLANTATION EPIDURAL NEUROSTIMULATOR ELECTRODE ARRAY x2 N/A 11/17/2021    Performed by Evelina Bucy, MD at [facility] OR    ELECTRONIC ANALYSIS IMPLANTED NEUROSTIMULATOR PULSE GENERATOR SYSTEM - COMPLEX SPINAL CORD/ PERIPHERAL NEUROSTIMULATOR PULSE GENERATOR/ TRANSMITTER WITH INTRA OPERATIVE/ SUBSEQUENT PROGRAMING N/A 11/17/2021    Performed by Evelina Bucy, MD at [facility] OR    SIGMOIDOSCOPY DIAGNOSTIC WITH COLLECTION SPECIMEN BY BRUSHING/ WASHING - FLEXIBLE N/A 09/30/2022    Performed by Jolee Ewing, MD at [facility] ENDO  ESOPHAGOGASTRODUODENOSCOPY WITH SPECIMEN COLLECTION BY BRUSHING/ WASHING N/A 09/30/2022    Performed by Jolee Ewing, MD at [facility] ENDO    EXAM ANORECTAL UNDER ANESTHESIA, HEMMORHOIDECTOMY, EXCISION OF ANAL LESION N/A 02/27/2023    Performed by Evalina Field, MD at [facility] OR    . N/A 02/27/2023    Performed by Evalina Field, MD at [facility] OR    . N/A 02/27/2023    Performed by Evalina Field, MD at [facility] OR    CARDIAC DEFIBRILLATOR PLACEMENT  11/2013, 02-2018    S/P Fidelis ICD Lead Extraction and New Lead Implantation    HX BACK SURGERY  2013, 2006    Lumbart Laminectomy and Fusion    HX CARPAL TUNNEL RELEASE Right 1990s    HX CORONARY ARTERY BYPASS GRAFT  2006    HX ENDOSCOPY  2019    HX HEART CATHETERIZATION      HX HEMORRHOIDECTOMY  2021, 2022    KNEE SURGERY  2015, 2016, 2020    UMBILICAL ARTERIAL CATH - BEDSIDE  2006    UPPER GASTROINTESTINAL ENDOSCOPY         SOCIAL BACKGROUND/HISTORY: Johnny [name] was born in [place], [place] and raised by his biological parents. He is the middle of 3 children.  Patient denied experiencing any abuse or neglect in his lifetime. He described family relationships as very good with her sister, and limited wit his brother. Johnny [name] completed a Bachelors degree and some additional courses. He has been retired from his full time job since 2015; he still does some part-time work. He has been working in farming since 1969. He has been married to his wife, Leta Jungling, for the past 3 years - their relationship is great. He reported a previous marriage that ended in divorce from [phone]. The patient has 2 adult children, who live nearby. He lives with his wife and a dog. He described the following current life stressors: medical issues. He reported having good social support. He enjoys the following activities/hobbies: reading and traveling.      UNDERSTANDING OF SURGERY: Johnny [name] demonstrated an adequate awareness of what the spinal cord stimulator procedure would entail. He demonstrated awareness of the risks of infection and need for follow-up care following the surgery. He also demonstrated understanding of the lifestyle changes associated with more favorable outcomes of surgery.  Additionally, the patient reported willingness to seek surgery under these conditions and evidenced intent to comply with related behavioral recommendations. his expectations for pain reeducation are appropriate. his motives for surgery are appropriate; he desires to improve his health and quality of life.      Caregiver Information: Leta Jungling will serve as patient?s caregiver following surgery.     PSYCHOMETRIC TEST RESULTS:  PROMIS (Patient Reported Outcomes Measurement Information System)               Physical Function: Raw score = 12/30              Anxiety: Raw score = 8/30              Depression: Raw score = 9/30              Fatigue: Raw score = 19/30              Sleep Disturbance: Raw score = 8/30              Social Role: Raw score = 17/30  Pain Interference: Raw score = 24/30              Pain Intensity: 7/10    [place] Scale for Kinesiophobia: 35    Pain Catastrophizing Scale:    Rumination: 3   Magnification: 1   Helplessness: 2   Total: 5    Montreal Cognitive Assessment (MoCA) BLIND version: 21/22  The MoCA BLIND is an adapted version of the original MoCA, designed to detect mild cognitive impairments.The MoCA assesses short term memory, executive functions, attention, concentration and working memory, language, abstract reasoning and orientation to time and place. Patient?s memory and concentration fell in the normal range of functioning.       Domains Scores   Attention   6/6   Language   3/3   Abstraction   2/2   Delayed Recall   4/5   Orientation   6/6   Education   0/1   MoCA Total and Interpretation   21/22   Normal > 17/22    Stanford Integrated Psychosocial Assessment for Transplantation (SIPAT) SCORES:              (Note: Lower scores indicate stronger candidate status).  Patient?s Readiness Level: 2  Social Support System: 2  Psychological Stability & Psychopathology: 0  Lifestyle and effect of substance use: 4  SIPAT TOTAL SCORE: 8   SIPAT SCORE INTERPRETATION: Good Candidate    Mental Status Examination/Behavioral Observations:  General/Constitutional: Related appropriately.   Speech/Motor: Fluent. Normal for rate, rhythm, and tone   Mood/Affect: Good/Congruent affect.  Thought Process: Linear, goal directed, coherent, easy to understand  Associations: Intact  Thought Content:  Neg for delusions, phobias, and preoccupations.   Perception: Neg for AH, VH.   Insight/Judgement: fair/fair  Orientation: Alert/ oriented x 3  Recent and Remote Memory: Grossly intact  Attention span and concentration: Intact  Language: Unremarkable  Fund of knowledge and vocabulary: Good  Suicidal Ideation: Denied      IMPRESSIONS: Johnny [name] is a 77 y.o. male presenting with chronic pain. He denied a history of illicit substance use, including Cannabis. He endorsed a remote, and limited history of tobacco use, and that he has not smoked in several decades. He endorsed rare alcohol use with no detrimental effects. He denied a history of psychiatric diagnoses, symptoms, or treatment. He reported having a good support system.  He demonstrated compliance with medical recommendations. Patient demonstrated a realistic outlook SCS procedure and expected pain reduction.  Furthermore, he has a good understanding of the risks and benefits of the procedure.  Given this information, the patient appears to be a suitable candidate for surgery.     Diagnostic Impression:      Pre-surgical psychological evaluation    Medical Diagnosis:   Mononeuropathy  Chronic pain of right knee  Neuropathic pain    Plan/Recommendations: There is no further need to assess patient at this time.   1. Encourage patient to review educational materials provided by [facility] and the following website: [email or web address]  2. Encourage patient to share educational materials with caregivers.  3. Consider referral to Dr. Gustavus Messing at [facility] psychology services for psychotherapy related to chronic pain management and post-operative care.  4. Patient was encouraged to seek further information at the resource room in the [facility] and to request a consultation with representative from [employer] if needed.     The proposed treatment plan was discussed with the patient/guardian who was provided the opportunity to ask questions  and make suggestions regarding alternative treatment.      Thank you for the opportunity to be involved with this patient?s care.      [facility] [place], [place].   Psychology Extern    Note: Services and documentation were provided under the supervision of a licensed psychologist.

## 2023-04-25 ENCOUNTER — Encounter: Admit: 2023-04-25 | Discharge: 2023-04-25 | Payer: MEDICARE

## 2023-04-25 DIAGNOSIS — M5417 Radiculopathy, lumbosacral region: Secondary | ICD-10-CM

## 2023-04-25 DIAGNOSIS — Z01818 Encounter for other preprocedural examination: Secondary | ICD-10-CM

## 2023-04-25 NOTE — Telephone Encounter
DAYS USED 30 / 30     DAYS USED 4+ HOURS 30/30     AVG DAILY USAGE (DAYS USED) 7 HOURS 44 MIN     AVG AHI 0.3    AVG CAI 0.0    % TIME IN CSR 0.0     SET PRESSURE 9.0     95% LEAK 22.9    DEVICE AIRSENSE 11 AUTOSET    MODE CPAP    SET PRESSURE (cmH2O) 9.0    EPR FULLTIME    EPR LEVEL (cmH2O)  3     RAMP ENABLE OFF     RAMP TIME (min) 25    START PRESSURE (cmH2O) 4.0     CLIMATE CONTROL AUTO     HUMIDIFIER LEVEL AUTO    TUBE TEMPERATURE  78

## 2023-04-26 ENCOUNTER — Encounter: Admit: 2023-04-26 | Discharge: 2023-04-26 | Payer: MEDICARE

## 2023-04-26 ENCOUNTER — Ambulatory Visit: Admit: 2023-04-26 | Discharge: 2023-04-27 | Payer: MEDICARE

## 2023-04-26 DIAGNOSIS — G4733 Obstructive sleep apnea (adult) (pediatric): Secondary | ICD-10-CM

## 2023-04-26 DIAGNOSIS — G4752 REM sleep behavior disorder: Secondary | ICD-10-CM

## 2023-04-26 NOTE — Progress Notes
Date of Service: 04/26/2023    Subjective:             Johnny [name] is a 77 y.o. male. He is accompanied by his wife.     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    Interval History    He continues to follow with Dr. Nedra Hai in the movement disorders clinic for PD and with Dr. Meredeth Ide in the neuromuscular clinic for MG.     Today on follow up, he notes that he is due for new CPAP supplies. He does note that he needs a larger mask size. He would like to try a size L DreamWisp.   He denies any issues with CPAP.   Wife denies any snoring with CPAP.  Uses machine even with travel. He sleeps better with CPAP.   Feels more alert during the day.     Wife has not witnessed any dream enactment behavior in the past 6 months.   He is taking melatonin, 5-10 mg. Hasn't noticed that the higher dose is more effective than the lower dose.           04/26/2023    11:20 AM   Epworth Sleepiness Scale   Sitting and reading 1   Watching TV 0   Sitting inactive in a public place (e.g. a theater or a meeting) 0   As a passenger in a car for an hour without a break 1   Lying down to rest in the afternoon when circumstances permit 3   Sitting and talking to someone 0   Sitting quietly after a lunch without alcohol 1   In a car, while stopped for a few minutes in traffic 0   Epworth Sleepiness Scale Score 6   He denies drowsy driving.                Objective:         acetaminophen (TYLENOL) 325 mg tablet Take two tablets by mouth every 4 hours as needed for Pain.    albuterol (VENTOLIN HFA, PROAIR HFA) 90 mcg/actuation inhaler Inhale two puffs by mouth into the lungs four times daily as needed for Wheezing.    alpha lipoic acid 600 mg capsule Take one capsule by mouth daily.    aspirin 81 mg chewable tablet Chew one tablet by mouth at bedtime daily.    carbidopa/levodopa (SINEMET) 25/100 mg tablet Take 2 tabs in the AM, 2 tabs in the afternoon, and 1 tab in the evening    cetirizine (ZYRTEC) 10 mg tablet Take one tablet by mouth daily.    cholecalciferol (Vitamin D3) (VITAMIN D-3) 1,000 units tablet Take one tablet by mouth twice daily.      coenzyme Q10 200 mg capsule Take one capsule by mouth daily.    cyanocobalamin (vitamin B-12) 500 mcg tablet Take one tablet by mouth daily.    Docusate Sodium 250 mg cap Take 250mg  in the am and 500mg  in the evening    ECHINACEA PO Take 1 capsule by mouth daily.    ezetimibe (ZETIA) 10 mg tablet TAKE 1 TABLET BY MOUTH AT BEDTIME    famotidine (PEPCID) 20 mg tablet Take one tablet by mouth at bedtime daily. Indications: gastroesophageal reflux disease    fesoterodine ER (TOVIAZ) 8 mg tablet Take one tablet by mouth every 48 hours. Do not cut/ crush/ chew    fexofenadine(+) (ALLEGRA) 180 mg tablet Take one tablet by mouth  daily.    fluticasone-umeclidin-vilanter (TRELEGY ELLIPTA) 100-62.5-25 mcg inhaler Inhale 1 Dose by mouth into the lungs daily after dinner.    furosemide (LASIX) 40 mg tablet Take 2 tabs on Sunday-Tuesday- Thursday.  Take 3 tabs on Monday-Wednesday-Friday-Saturday (Patient taking differently: Take 2 tabs on Mon-Wed-Fri  Take 3 tabs on Sun-Tues-Thur-Sat)    gabapentin (NEURONTIN) 300 mg capsule Take one capsule by mouth three times daily.    glucosamine su 2KCl-chondroit 500-400 mg tab Take 1 Tab by mouth twice daily.    guaiFENesin LA (MUCINEX) 600 mg tablet Take one tablet by mouth twice daily.    ipratropium bromide (ATROVENT) 42 mcg (0.06 %) nasal spray Apply two sprays to each nostril as directed twice daily as needed.    ketoconazole (NIZORAL) 2 % topical shampoo Apply  topically to affected area every 7 days. Apply topically to affected area of damp skin, lather, leave on 5 minutes, and rinse.    losartan (COZAAR) 50 mg tablet TAKE 1 TABLET BY MOUTH EVERY DAY (Patient taking differently: Take one tablet by mouth at bedtime daily.)    methocarbamoL (ROBAXIN) 500 mg tablet Take one tablet by mouth three times daily as needed for Spasms.    metoprolol succinate XL (TOPROL XL) 100 mg extended release tablet TAKE 1 TABLET BY MOUTH EVERY DAY    mucus clearing device (AEROBIKA OSCILLATING PEP SYSTM MISC) Use  as directed. Use with salt water and inhale twice daily    OMEGA-3 FATTY ACIDS-FISH OIL PO Take 1 capsule by mouth twice daily.    omeprazole DR (PRILOSEC) 40 mg capsule TAKE 1 CAPSULE BY MOUTH TWICE DAILY    other medication one Dose. Mupirocin dissolved in warm water and Budesonide in NS  qhs  (sometimes increases to BID)    polyethylene glycol 3350 (MIRALAX) 17 g packet Take one packet by mouth daily as needed.    potassium chloride SR (K-DUR) 20 mEq tablet TAKE 2 TABLETS BY MOUTH ON MONDAY, WEDNESDAY AND FRIDAY AND 1 TABLET ALL OTHER DAYS (Patient taking differently: TAKE 1 TABLETS BY MOUTH ON MONDAY, WEDNESDAY AND FRIDAY AND 1 TABLET TWICE DAILY ON OTHER DAYS)    pyRIDostigmine bromide (MESTINON) 60 mg tablet Take two tablets by mouth three times daily.      rosuvastatin (CRESTOR) 40 mg tablet TAKE 1 TABLET BY MOUTH AT BEDTIME    spironolactone (ALDACTONE) 25 mg tablet TAKE 1 TABLET BY MOUTH EVERY DAY WITH FOOD    tamsulosin (FLOMAX) 0.4 mg capsule Take one capsule by mouth daily.    testosterone cypionate 200 mg/mL kit Inject 1 mL into the muscle every 14 days.    traZODone (DESYREL) 100 mg tablet Take one tablet by mouth at bedtime daily.    Vacuum Erection Device System kit Use as directed for sexual activity.    warfarin (COUMADIN) 3 mg tablet Take one tablet by mouth at bedtime daily.     Vitals:    04/26/23 1118   BP: 111/61   Pulse: 89   SpO2: 97%   PainSc: Zero   Weight: 104.8 kg (231 lb)   Height: 180.3 cm (5' 11)     Body mass index is 32.22 kg/m?Marland Kitchen     Physical Exam  General appearance: Pleasant. No acute distress.   Ears, Nose, and Throat: Nares patent. Mallampati II-III. Narrow palate. Mild retrognathia.   Cardiovascular: Regular rate and rhythm.   Lungs: Clear to auscultation bilaterally.   Extremities: Wearing compression stockings. Neurological: Alert, attentive, cooperative. Hypomimia. Slight resting  tremor in hands.   Psychiatric: Normal mood and affect.            Assessment and Plan:  Osa (Obstructive Sleep Apnea)     CPAP titration (03/13/20);  6 CM H20 recommended  PLMI 4.4, PLMAI 0.6  Increased muscle tone noted during some epochs of REM     Split night sleep study (04/16/21):  AHI 11.6 (CAI 0), SpO2 min 83%, 6.3 min less than or equal to 88%  CPAP 9 cm H20  PLMI Diagnostic portion 92.3 , PLMI Treatment Portion 18/hr without arousals         Rem Sleep Behavior Disorder         OSA (obstructive sleep apnea)  OSA on CPAP.   Reviewed download in detail.   Patient is experiencing symptomatic benefit from CPAP. He has excellent compliance and OSA is controlled on current CPAP settings.  Various aspects of CPAP usage and compliance were discussed with patient. He was advised to use CPAP  during any sleep period.   Place order for new supplies. Include chinstrap, as patient notices mouth opening sometimes. He prefers to try DreamWisp Size L.                      REM sleep behavior disorder  Increased muscle tone during REM sleep was noted during split night sleep study, in the absence of significant respiratory events. There were not large body movements observed during the study.   Wife denies any recent dream enactment episodes on melatonin.  I counseled the patient extensively on maintaining a safe sleep environment for the patient and bed partner.   As he reports no episodes on melatonin 5 mg, he will continue on this dose for now.   Johnny [name] was advised to update the clinic if he has recurrent dream enactment episodes.                  The risks associated with driving or operating heavy machinery while sleepy were discussed with the patient, who understands and agrees to take measures to eliminate these risks.     Return to clinic in 1 year, sooner if needed.  A CPAP download will be obtained to evaluate compliance and efficacy of treatment.

## 2023-04-26 NOTE — Telephone Encounter
-----   Message from Verlee Rossetti, MD sent at 04/26/2023 11:51 AM CDT -----  Regarding: New APAP Start    Hi Johnny [name],    See order below:    Include all new supplies including, heated tubing, humidifier, filters, mask per patient choice, all other necessary equipment. Patient wants to try DreamWisp Size L. Please include chin strap.     Thank you.

## 2023-04-26 NOTE — Patient Instructions
It was great to see you today!    I will order you new supplies. You can try the larger size mask.     Keep up the good use of your CPAP during any sleep period.    Continue the melatonin at bedtime.

## 2023-05-01 ENCOUNTER — Encounter: Admit: 2023-05-01 | Discharge: 2023-05-01 | Payer: MEDICARE

## 2023-05-01 MED ORDER — GABAPENTIN 300 MG PO CAP
300 mg | ORAL_CAPSULE | Freq: Three times a day (TID) | ORAL | 3 refills | Status: AC
Start: 2023-05-01 — End: ?

## 2023-05-01 MED ORDER — EZETIMIBE 10 MG PO TAB
ORAL_TABLET | 3 refills | Status: AC
Start: 2023-05-01 — End: ?

## 2023-05-01 NOTE — Progress Notes
Attempted to complete prior authorization for Fesoterodine ER (Toviaz) through CoverMyMeds. Unable to complete PA.

## 2023-05-01 NOTE — Telephone Encounter
Received refill request for Gabapentin. LOV 09/2022. Refill RX sent to requesting pharmacy per plan of care.

## 2023-05-05 ENCOUNTER — Encounter: Admit: 2023-05-05 | Discharge: 2023-05-05 | Payer: MEDICARE

## 2023-05-05 ENCOUNTER — Ambulatory Visit: Admit: 2023-05-05 | Discharge: 2023-05-06 | Payer: MEDICARE

## 2023-05-05 DIAGNOSIS — I509 Heart failure, unspecified: Secondary | ICD-10-CM

## 2023-05-05 DIAGNOSIS — I2510 Atherosclerotic heart disease of native coronary artery without angina pectoris: Secondary | ICD-10-CM

## 2023-05-05 DIAGNOSIS — K219 Gastro-esophageal reflux disease without esophagitis: Secondary | ICD-10-CM

## 2023-05-05 DIAGNOSIS — M48061 Spinal stenosis, lumbar region without neurogenic claudication: Secondary | ICD-10-CM

## 2023-05-05 DIAGNOSIS — M4800 Spinal stenosis, site unspecified: Secondary | ICD-10-CM

## 2023-05-05 DIAGNOSIS — R053 Chronic cough: Secondary | ICD-10-CM

## 2023-05-05 DIAGNOSIS — D699 Hemorrhagic condition, unspecified: Secondary | ICD-10-CM

## 2023-05-05 DIAGNOSIS — G20A1 Parkinson disease (HCC): Secondary | ICD-10-CM

## 2023-05-05 DIAGNOSIS — N289 Disorder of kidney and ureter, unspecified: Secondary | ICD-10-CM

## 2023-05-05 DIAGNOSIS — E669 Obesity, unspecified: Secondary | ICD-10-CM

## 2023-05-05 DIAGNOSIS — T148XXA Other injury of unspecified body region, initial encounter: Secondary | ICD-10-CM

## 2023-05-05 DIAGNOSIS — R269 Unspecified abnormalities of gait and mobility: Secondary | ICD-10-CM

## 2023-05-05 DIAGNOSIS — R259 Unspecified abnormal involuntary movements: Secondary | ICD-10-CM

## 2023-05-05 DIAGNOSIS — I340 Nonrheumatic mitral (valve) insufficiency: Secondary | ICD-10-CM

## 2023-05-05 DIAGNOSIS — Z95810 Presence of automatic (implantable) cardiac defibrillator: Secondary | ICD-10-CM

## 2023-05-05 DIAGNOSIS — IMO0002 Ulcer: Secondary | ICD-10-CM

## 2023-05-05 DIAGNOSIS — M549 Dorsalgia, unspecified: Secondary | ICD-10-CM

## 2023-05-05 DIAGNOSIS — K5900 Constipation, unspecified: Secondary | ICD-10-CM

## 2023-05-05 DIAGNOSIS — M2550 Pain in unspecified joint: Secondary | ICD-10-CM

## 2023-05-05 DIAGNOSIS — G7000 Myasthenia gravis without (acute) exacerbation: Secondary | ICD-10-CM

## 2023-05-05 DIAGNOSIS — M1990 Unspecified osteoarthritis, unspecified site: Secondary | ICD-10-CM

## 2023-05-05 DIAGNOSIS — E785 Hyperlipidemia, unspecified: Secondary | ICD-10-CM

## 2023-05-05 DIAGNOSIS — K449 Diaphragmatic hernia without obstruction or gangrene: Secondary | ICD-10-CM

## 2023-05-05 DIAGNOSIS — I255 Ischemic cardiomyopathy: Secondary | ICD-10-CM

## 2023-05-05 DIAGNOSIS — J449 Chronic obstructive pulmonary disease, unspecified: Secondary | ICD-10-CM

## 2023-05-05 DIAGNOSIS — I10 Essential (primary) hypertension: Secondary | ICD-10-CM

## 2023-05-05 DIAGNOSIS — I219 Acute myocardial infarction, unspecified: Secondary | ICD-10-CM

## 2023-05-05 DIAGNOSIS — H9190 Unspecified hearing loss, unspecified ear: Secondary | ICD-10-CM

## 2023-05-05 DIAGNOSIS — I4891 Unspecified atrial fibrillation: Secondary | ICD-10-CM

## 2023-05-05 DIAGNOSIS — G4733 Obstructive sleep apnea (adult) (pediatric): Secondary | ICD-10-CM

## 2023-05-05 DIAGNOSIS — G20A2 Parkinson's disease without dyskinesia, with fluctuations (HCC): Secondary | ICD-10-CM

## 2023-05-05 DIAGNOSIS — N529 Male erectile dysfunction, unspecified: Secondary | ICD-10-CM

## 2023-05-05 DIAGNOSIS — G259 Extrapyramidal and movement disorder, unspecified: Secondary | ICD-10-CM

## 2023-05-05 DIAGNOSIS — M5136 Other intervertebral disc degeneration, lumbar region: Secondary | ICD-10-CM

## 2023-05-05 DIAGNOSIS — G5600 Carpal tunnel syndrome, unspecified upper limb: Secondary | ICD-10-CM

## 2023-05-05 DIAGNOSIS — R7303 Prediabetes: Secondary | ICD-10-CM

## 2023-05-05 DIAGNOSIS — J302 Other seasonal allergic rhinitis: Secondary | ICD-10-CM

## 2023-05-05 DIAGNOSIS — H269 Unspecified cataract: Secondary | ICD-10-CM

## 2023-05-05 NOTE — Progress Notes
[facility]  Department of Neurology   [facility]    Date of service: 05/05/2023     Reason for visit:  Johnny [name] is 77 y.o. 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  Presents with wife who aids in providing history.     LOV 09/2022  Had a fall at a vacation home , tripped over hearth and fell  No serious injury no head strike     Last visit increased levodopa from 300 to 500 mg daily   Hand tremor is better  Still having some issues with fine motor tasks     Tends to stoop when walking with cane     Mild dysphagia with swallowing pills     Has follow up with Dr. Meredeth Ide for MG this month     Meds:   carbidopa/levodopa IR 25/100mg  2 - 2 - 1 tab TID 8 / 2 / 7  Mestinon 90 mg TID  Myrbetriq     Prior trials-  Prism lenses - prescribed by Dr. Valarie Merino - no benefit         05/05/2023   Parkinson?s Follow-Up Questionnaire   Since last visit I am: Slightly worse (1-25%)   Memory Problems: Slight. but similar to others my age.   Hallucinations/delusions: seeing, hearing, feeling or imagining things that are not there or not true: None   Depression: feeling sad, blue, hopeless, unable to enjoy things: Slight. Occurs rarely and is not bothersome.   Anxiety: nervous, worried, tearful or tense: None.   Apathy: loss of interest, enthusaism, or concern: None.   Gambling: None.   Shopping: None.   Sex: None.   Pornography: None.   Eating: Slight. I have urges but I can control them.   Doing unnecessary things like emptying drawers/closets in bedroom/garage and leaving things in a mess: None.   Nighttime sleep: No difficulty sleeping through the night.   Daytime sleepiness: Slight. I get sleepy occasionally after a meal or if I am watching television.   Vivid dreams: dreams that are so clear they seem real. None.   REM sleep behavior disorder: talking during or acting out your dreams. Slightly, rarely do I talk or yell in my sleep.   Restless legs syndrome: uncomfortable sensations in your legs that are uncontrollable and occur when resting, in the evenings, or when you get in bed. None.   Pain or muscle cramps: Moderate. I have back pain which limits my activities and may require medication.   Urination: Mild. I have to go frequently, and it is bothersome, or I wake up frequently at night to go to the bathroom.   Constipation: Mild. I have occasional constipation but can manage it with water, diet, and exercise.   Dizziness or lightheadedness: None.   Tiredness/Fatigue: Mild. I am occasionally tired and sometimes I am not able to complete my tasks/activities.   Falling: Slight. I have rarely fallen, maybe only a few times a year.   Personal Care: Slight. I take care of myself, but several activities take me longer to complete.   Assistive devices for getting around: Gilmer Mor   Are you taking any form of levodopa? Yes   Do you have OFF time while you are awake during the day? OFF time is time during the day when your Parkinson's medications are not working or not working as well as  usual. None   Do you have dyskinesia while you are awake during the day? Dyskinesia refers to involuntary movements that can cause wiggling or twisting movements of the head, hands, legs, trunk, face, jaw, or any other part of the body. None   Employment: Retired - not due to [employer].           Medications:   Current Outpatient Medications on File Prior to Visit   Medication Sig Dispense Refill    acetaminophen (TYLENOL) 325 mg tablet Take two tablets by mouth every 4 hours as needed for Pain. 300 tablet 1    albuterol (VENTOLIN HFA, PROAIR HFA) 90 mcg/actuation inhaler Inhale two puffs by mouth into the lungs four times daily as needed for Wheezing.      alpha lipoic acid 600 mg capsule Take one capsule by mouth daily.      aspirin 81 mg chewable tablet Chew one tablet by mouth at bedtime daily.      carbidopa/levodopa (SINEMET) 25/100 mg tablet Take 2 tabs in the AM, 2 tabs in the afternoon, and 1 tab in the evening 450 tablet 3    cetirizine (ZYRTEC) 10 mg tablet Take one tablet by mouth daily.      cholecalciferol (Vitamin D3) (VITAMIN D-3) 1,000 units tablet Take one tablet by mouth twice daily.        coenzyme Q10 200 mg capsule Take one capsule by mouth daily.      cyanocobalamin (vitamin B-12) 500 mcg tablet Take one tablet by mouth daily.      Docusate Sodium 250 mg cap Take 250mg  in the am and 500mg  in the evening      ECHINACEA PO Take 1 capsule by mouth daily.      ezetimibe (ZETIA) 10 mg tablet TAKE 1 TABLET BY MOUTH AT BEDTIME 90 tablet 3    famotidine (PEPCID) 20 mg tablet Take one tablet by mouth at bedtime daily. Indications: gastroesophageal reflux disease 90 tablet 1    fesoterodine ER (TOVIAZ) 8 mg tablet Take one tablet by mouth every 48 hours. Do not cut/ crush/ chew 30 tablet 1    fexofenadine(+) (ALLEGRA) 180 mg tablet Take one tablet by mouth daily.      fluticasone-umeclidin-vilanter (TRELEGY ELLIPTA) 100-62.5-25 mcg inhaler Inhale 1 Dose by mouth into the lungs daily after dinner.      furosemide (LASIX) 40 mg tablet Take 2 tabs on Sunday-Tuesday- Thursday.  Take 3 tabs on Monday-Wednesday-Friday-Saturday (Patient taking differently: Take 2 tabs on Mon-Wed-Fri  Take 3 tabs on Sun-Tues-Thur-Sat) 226 tablet 3    gabapentin (NEURONTIN) 300 mg capsule TAKE 1 CAPSULE BY MOUTH THREE TIMES DAILY (Patient taking differently: Take one capsule by mouth four times daily.) 270 capsule 3    glucosamine su 2KCl-chondroit 500-400 mg tab Take 1 Tab by mouth twice daily.      guaiFENesin LA (MUCINEX) 600 mg tablet Take one tablet by mouth twice daily.      ipratropium bromide (ATROVENT) 42 mcg (0.06 %) nasal spray Apply two sprays to each nostril as directed twice daily as needed.      ketoconazole (NIZORAL) 2 % topical shampoo Apply  topically to affected area every 7 days. Apply topically to affected area of damp skin, lather, leave on 5 minutes, and rinse.      losartan (COZAAR) 50 mg tablet TAKE 1 TABLET BY MOUTH EVERY DAY (Patient taking differently: Take one tablet by mouth at bedtime daily.) 90 tablet 1    methocarbamoL (ROBAXIN)  500 mg tablet Take one tablet by mouth three times daily as needed for Spasms. 90 tablet 3    metoprolol succinate XL (TOPROL XL) 100 mg extended release tablet TAKE 1 TABLET BY MOUTH EVERY DAY 90 tablet 3    mucus clearing device (AEROBIKA OSCILLATING PEP SYSTM MISC) Use  as directed. Use with salt water and inhale twice daily      OMEGA-3 FATTY ACIDS-FISH OIL PO Take 1 capsule by mouth twice daily.      omeprazole DR (PRILOSEC) 40 mg capsule TAKE 1 CAPSULE BY MOUTH TWICE DAILY 180 capsule 0    other medication one Dose. Mupirocin dissolved in warm water and Budesonide in NS  qhs  (sometimes increases to BID)      polyethylene glycol 3350 (MIRALAX) 17 g packet Take one packet by mouth daily as needed.      potassium chloride SR (K-DUR) 20 mEq tablet TAKE 2 TABLETS BY MOUTH ON MONDAY, WEDNESDAY AND FRIDAY AND 1 TABLET ALL OTHER DAYS (Patient taking differently: TAKE 1 TABLETS BY MOUTH ON MONDAY, WEDNESDAY AND FRIDAY AND 1 TABLET TWICE DAILY ON OTHER DAYS) 140 tablet 3    pyRIDostigmine bromide (MESTINON) 60 mg tablet Take two tablets by mouth three times daily.        rosuvastatin (CRESTOR) 40 mg tablet TAKE 1 TABLET BY MOUTH AT BEDTIME 90 tablet 2    spironolactone (ALDACTONE) 25 mg tablet TAKE 1 TABLET BY MOUTH EVERY DAY WITH FOOD 90 tablet 3    tamsulosin (FLOMAX) 0.4 mg capsule Take one capsule by mouth daily.      testosterone cypionate 200 mg/mL kit Inject 1 mL into the muscle every 14 days.      traZODone (DESYREL) 100 mg tablet Take one tablet by mouth at bedtime daily.      Vacuum Erection Device System kit Use as directed for sexual activity. 1 kit 11    warfarin (COUMADIN) 3 mg tablet Take one tablet by mouth at bedtime daily.       No current facility-administered medications on file prior to visit.       Past Medical History:    Past Medical History:   Diagnosis Date    Abnormal involuntary movement 1994    Atrial fibrillation (HCC) 02/08/2009    Back pain     Bleeding disorder (HCC) when started Xarelto    CAD (coronary artery disease) 02/08/2009    Carpal tunnel syndrome     Cataract 2017    both removed    Chronic cough     Congestive heart disease (HCC)     Constipation 2015    COPD (chronic obstructive pulmonary disease) (HCC)     Degenerative disc disease, lumbar 2010    Erectile dysfunction, vasculogenic     Fracture     GERD (gastroesophageal reflux disease)     controlled with elevated HOB, protonix and pepcid     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 now    Heart attack (HCC) possible in 2006    Hiatal hernia     Hyperlipemia 02/11/2009    Hypertension 02/11/2009    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    Joint pain 1966    Kidney disease 2019    Mitral regurgitation     Movement disorder 2019    Myasthenia gravis (HCC) 2023    Myocardial infarction (HCC) 2006    Obesity, Class I, 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)  OSA on CPAP 02/11/2009    Osteoarthritis     Parkinson disease (HCC) 2022    Pre-diabetes     S/P ICD (internal cardiac defibrillator) procedure 02/11/2009    Seasonal allergies     Spinal stenosis 2010    Ulcer         Social History:    Social History     Socioeconomic History    Marital status: Married   Tobacco Use    Smoking status: Never    Smokeless tobacco: Never   Vaping Use    Vaping status: Never Used   Substance and Sexual Activity    Alcohol use: Not Currently     Comment: very rarely 4 drinks per year    Drug use: No    Sexual activity: Yes     Partners: Female     Birth control/protection: None       Family History:   Family History   Problem Relation Name Age of Onset    Cancer Father Gregery     Heart problem Father Elo     Heart Disease Father Damoney Hypertension Mother Britta Mccreedy     Joint Pain Mother Britta Mccreedy     Cancer Mother Britta Mccreedy     Cancer Sister Kelby Fam         Allergies:   Allergies   Allergen Reactions    Clarithromycin RASH     [place] and burns both ends of GI tract    Ketoconazole SEE COMMENTS     Had bloodshot eyes after use, and sore.  Happens if he gets drug in his eyes.  Tolerates shampoo fine.    Lisinopril COUGH    Oxycodone UNKNOWN     Nightmares  Tolerates Tramadol         PHYSICAL EXAMINATION:      VITAL SIGNS:   Vitals:    05/05/23 1341 05/05/23 1348   BP: 132/79 95/61   BP Source: Arm, Left Upper Arm, Left Upper   Patient Position: Sitting Standing   Pulse: 97 77         Body mass index is 32.46 kg/m?Marland Kitchen    Overweight    NEUROLOGICAL EXAM:  MS: Alert, awake, cooperative. Speech fluent without paraphasic errors. Able to provide accurate history, remote memory intact. Good fund of knowledge.    Movement disorders exam  Mild hypophonia  Mild facial masking  Mild intermittent L resting tremor  Mild bilateral postural and action tremor   Mild bilateral rigidity  Mild bilateral L > R bradykinesia with finger taps, supination/pronation, hand grips, or foot taps/stomps  Stands up unassisted  Posture slightly stooped  Gait is narrow based, steady with fair stride length and arm swing , antalgic of back     UPDRS Motor:     Speech: 1 - Slight loss of expression, diction and/or volume.  Facial Expression: 1 - Minimal hypomimia, could be normal poker face.  Tremor at Rest Jaw: 0 - Absent.  Tremor at Rest RUE: 0 - Absent  Tremor at Rest LUE: 1 - Slight and infrequently present.  Tremor at Rest RLE: 0 - Absent  Tremor at Rest LLE: 0 - Absent  Action of Postural Tremor of Hands R: 1 - Slight and infrequent.  Action of Postural Tremor of Hands L: 1 - Slight and infrequent.  Rigidity NECK: 1 - Slight or detectable only when activated by mirror or other movements.  Rigidity RUE: 1 - Slight or detectable only when activated by mirror or other  movements.  Rigidity LUE: 2 - Mild to moderate.  Rigidity RLE: 1 - Slight or detectable only when activated by mirror or other movements  Rigidity LLE: 1 - Slight or detectable only when activated by mirror or other movements.  Finger Taps L: 2 - Moderately impaired. Definite and early fatiguing. May have occasional arrests in movement. (7-10/5 sec)  Hand Movements R: 1 - Mild slowing and/or reduction in amplitude.  Hand Movements L: 1 - Mild slowing and/or reduction in amplitude.  Rapid Alternating Movement of Hands R: 1 - Mild slowing and/or reduction in amplitude.  Rapid Alternating Movements of Hands L: 2 - Moderately impaired. Definite and early fatiguing. May have occasional arrests in movement.  Leg Agility with Knee Bent R: 2 - Moderately impaired. Definite and early fatiguing.  Leg Agility with Knee Bent L: 2 - Moderately impaired. Definite and early fatiguing.  Arising From Chair: 1 - Slow, or may need more than one attempt.  Posture: 2 - Moderately stooped posture, definitely abnormal- can be slightly to one side.  Gait: 1 - Walks slowly, may shuffle with short steps but no festination or propulsion.  Postural Stability: 0 - Normal  Body Bradykinesia and Hypokinesia: 2 - Mild degree of slowness and poverty of movement which is definitely abnormal. Alternatively, some reduced amplitude.  Total Motor Exam: 29      Labs/Imaging:      Assessment/Plan:    Johnny [name] is a 77 y.o. right handed male PMH CAD s//p 6vCABG 2006, COPD, atrial fibrillation, s/p ICD, HTN, OSA compliant with CPAP, neuropathy. who presents today for evaluation of hand tremors, symptom onset 1993 with worsening of L hand tremor around 2018. Started on levodopa in 2022 and noticed improvement in tremor, movements. Examination reveals a mild bilateral postural/action tremor, with an intermittent L resting tremor, and mild L  > R bradykinesia/ rigidity. 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 Meredeth Ide    Appears reasonably well dosed on levodopa    Continue 2-2-1 tabs TID     Continue exercise, BIG movements - LSVT BIG and LOUD referrals provided     Continue CPAP     Cognitive decline- likely multifactorial from presumed [facility] I/s/o vascular risk factors, PD, aging - will monitor     Encouraged exercise and healthy Mediterranean MIND diet for neuroprotection -    Encouraged to maintain a healthy weight to reduce load on joints, etc.    Mild dysphagia-   To improve the safety of your swallowing, use the compensatory strategies below:  Sit fully upright in a chair while eating & drinking  Slow rate of intake - eat slowly  Small, single bites / sips  Do not tilt your head back when eating or drinking  Tilt head and chin slightly forward (45 degrees)  Avoid distractions during meals - do not talk when eating  Alternate solids / liquids                 Use of effortful/ hard swallow, especially with solids  Swallow 2 times per bite /drink      Oral care 2-3x/daily  Consider changing the texture of food to something more manageable such as chopping or puree  If liquids are difficult to swallow, try adding a thickener such as Thick It ? or Thicken Up?  which can be ordered at your pharmacy or purchase thickened juices  Follow up in 6 -8 months    Problem List Items Addressed This Visit    None  Visit Diagnoses       Parkinson's disease without dyskinesia, with fluctuating manifestations (HCC)    -  Primary    Relevant Orders    AMB REFERRAL TO PHYSICAL THERAPY    AMB REFERRAL TO OCCUPATIONAL THERAPY    AMB REFERRAL TO SPEECH THERAPY    Myasthenia (HCC)        Relevant Orders    AMB REFERRAL TO PHYSICAL THERAPY    AMB REFERRAL TO OCCUPATIONAL THERAPY    AMB REFERRAL TO SPEECH THERAPY    Gait abnormality        Relevant Orders    AMB REFERRAL TO PHYSICAL THERAPY    AMB REFERRAL TO OCCUPATIONAL THERAPY    AMB REFERRAL TO SPEECH THERAPY    Spinal stenosis of lumbar region, unspecified whether neurogenic claudication present        Relevant Orders    AMB REFERRAL TO PHYSICAL THERAPY    AMB REFERRAL TO OCCUPATIONAL THERAPY    AMB REFERRAL TO SPEECH THERAPY                Beatrix Shipper, MD  Assistant Professor of Neurology  [facility]  [facility]          I spent a total of 30 minutes on this patient's care on the day of their visit excluding time spent related to any billed procedures. This time includes face-to-face time with the patient as well as time spent documenting in the medical record, reviewing patient's records and tests, obtaining history, placing orders, communicating with other healthcare professionals, counseling the patient, family, or caregiver, and/or care coordination for the diagnoses above.

## 2023-05-06 DIAGNOSIS — G7000 Myasthenia gravis without (acute) exacerbation: Secondary | ICD-10-CM

## 2023-05-18 ENCOUNTER — Encounter: Admit: 2023-05-18 | Discharge: 2023-05-18 | Payer: MEDICARE

## 2023-05-18 ENCOUNTER — Ambulatory Visit: Admit: 2023-05-18 | Discharge: 2023-05-18 | Payer: MEDICARE

## 2023-05-18 DIAGNOSIS — K449 Diaphragmatic hernia without obstruction or gangrene: Secondary | ICD-10-CM

## 2023-05-18 DIAGNOSIS — G259 Extrapyramidal and movement disorder, unspecified: Secondary | ICD-10-CM

## 2023-05-18 DIAGNOSIS — T148XXA Other injury of unspecified body region, initial encounter: Secondary | ICD-10-CM

## 2023-05-18 DIAGNOSIS — R053 Chronic cough: Secondary | ICD-10-CM

## 2023-05-18 DIAGNOSIS — J302 Other seasonal allergic rhinitis: Secondary | ICD-10-CM

## 2023-05-18 DIAGNOSIS — M2550 Pain in unspecified joint: Secondary | ICD-10-CM

## 2023-05-18 DIAGNOSIS — I255 Ischemic cardiomyopathy: Secondary | ICD-10-CM

## 2023-05-18 DIAGNOSIS — M4800 Spinal stenosis, site unspecified: Secondary | ICD-10-CM

## 2023-05-18 DIAGNOSIS — E785 Hyperlipidemia, unspecified: Secondary | ICD-10-CM

## 2023-05-18 DIAGNOSIS — N529 Male erectile dysfunction, unspecified: Secondary | ICD-10-CM

## 2023-05-18 DIAGNOSIS — G20A1 Parkinson disease (HCC): Secondary | ICD-10-CM

## 2023-05-18 DIAGNOSIS — M1990 Unspecified osteoarthritis, unspecified site: Secondary | ICD-10-CM

## 2023-05-18 DIAGNOSIS — M549 Dorsalgia, unspecified: Secondary | ICD-10-CM

## 2023-05-18 DIAGNOSIS — M5136 Other intervertebral disc degeneration, lumbar region: Secondary | ICD-10-CM

## 2023-05-18 DIAGNOSIS — I509 Heart failure, unspecified: Secondary | ICD-10-CM

## 2023-05-18 DIAGNOSIS — H269 Unspecified cataract: Secondary | ICD-10-CM

## 2023-05-18 DIAGNOSIS — N289 Disorder of kidney and ureter, unspecified: Secondary | ICD-10-CM

## 2023-05-18 DIAGNOSIS — IMO0002 Ulcer: Secondary | ICD-10-CM

## 2023-05-18 DIAGNOSIS — G4733 Obstructive sleep apnea (adult) (pediatric): Secondary | ICD-10-CM

## 2023-05-18 DIAGNOSIS — R259 Unspecified abnormal involuntary movements: Secondary | ICD-10-CM

## 2023-05-18 DIAGNOSIS — J449 Chronic obstructive pulmonary disease, unspecified: Secondary | ICD-10-CM

## 2023-05-18 DIAGNOSIS — G7000 Myasthenia gravis without (acute) exacerbation: Secondary | ICD-10-CM

## 2023-05-18 DIAGNOSIS — I340 Nonrheumatic mitral (valve) insufficiency: Secondary | ICD-10-CM

## 2023-05-18 DIAGNOSIS — G5600 Carpal tunnel syndrome, unspecified upper limb: Secondary | ICD-10-CM

## 2023-05-18 DIAGNOSIS — I4891 Unspecified atrial fibrillation: Secondary | ICD-10-CM

## 2023-05-18 DIAGNOSIS — H9190 Unspecified hearing loss, unspecified ear: Secondary | ICD-10-CM

## 2023-05-18 DIAGNOSIS — Z95810 Presence of automatic (implantable) cardiac defibrillator: Secondary | ICD-10-CM

## 2023-05-18 DIAGNOSIS — K5900 Constipation, unspecified: Secondary | ICD-10-CM

## 2023-05-18 DIAGNOSIS — I2510 Atherosclerotic heart disease of native coronary artery without angina pectoris: Secondary | ICD-10-CM

## 2023-05-18 DIAGNOSIS — R7303 Prediabetes: Secondary | ICD-10-CM

## 2023-05-18 DIAGNOSIS — I219 Acute myocardial infarction, unspecified: Secondary | ICD-10-CM

## 2023-05-18 DIAGNOSIS — K219 Gastro-esophageal reflux disease without esophagitis: Secondary | ICD-10-CM

## 2023-05-18 DIAGNOSIS — I10 Essential (primary) hypertension: Secondary | ICD-10-CM

## 2023-05-18 DIAGNOSIS — E669 Obesity, unspecified: Secondary | ICD-10-CM

## 2023-05-18 DIAGNOSIS — D699 Hemorrhagic condition, unspecified: Secondary | ICD-10-CM

## 2023-05-18 LAB — PROTIME INR (PT)
INR: 1.6 — ABNORMAL HIGH (ref 0.9–1.2)
PROTIME: 17 s — ABNORMAL HIGH (ref 10.2–12.9)

## 2023-05-18 NOTE — Progress Notes
Date of Service: 05/18/2023    Subjective:             Johnny [name] is a 77 y.o. male.    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 He reports of continuing Diplopia  which is present mainly while looking up and lateral gaze. Symptoms are overall stable. He reports of trouble with driving and reports of more stiffness in the neck.  He had one or two episodes of choking on food and saw a speech therapist  who gave  sp,e maneuvers  to help with the swallowing .   He denies any Touble with breathing , other than due to COPD. He is using a CPAP at night. He has an Oxygen concentrator for trip to [place] as he has difficulty breathing at heights.       Allergies   Allergen Reactions    Clarithromycin RASH     Redden and burns both ends of GI tract    Ketoconazole SEE COMMENTS     Had bloodshot eyes after use, and sore.  Happens if he gets drug in his eyes.  Tolerates shampoo fine.    Lisinopril COUGH    Oxycodone UNKNOWN     Nightmares  Tolerates Tramadol                  Objective:         acetaminophen (TYLENOL) 325 mg tablet Take two tablets by mouth every 4 hours as needed for Pain.    albuterol (VENTOLIN HFA, PROAIR HFA) 90 mcg/actuation inhaler Inhale two puffs by mouth into the lungs four times daily as needed for Wheezing.    alpha lipoic acid 600 mg capsule Take one capsule by mouth daily.    aspirin 81 mg chewable tablet Chew one tablet by mouth at bedtime daily.    carbidopa/levodopa (SINEMET) 25/100 mg tablet Take 2 tabs in the AM, 2 tabs in the afternoon, and 1 tab in the evening    cetirizine (ZYRTEC) 10 mg tablet Take one tablet by mouth daily.    cholecalciferol (Vitamin D3) (VITAMIN D-3) 1,000 units tablet Take one tablet by mouth twice daily.      coenzyme Q10 200 mg capsule Take one capsule by mouth daily.    cyanocobalamin (vitamin B-12) 500 mcg tablet Take one tablet by mouth daily.    Docusate Sodium 250 mg cap Take 250mg  in the am and 500mg  in the evening    ECHINACEA PO Take 1 capsule by mouth daily.    ezetimibe (ZETIA) 10 mg tablet TAKE 1 TABLET BY MOUTH AT BEDTIME    famotidine (PEPCID) 20 mg tablet Take one tablet by mouth at bedtime daily. Indications: gastroesophageal reflux disease    fesoterodine ER (TOVIAZ) 8 mg tablet Take one tablet by mouth every 48 hours. Do not cut/ crush/ chew    fexofenadine(+) (ALLEGRA) 180 mg tablet Take one tablet by mouth daily.    fluticasone-umeclidin-vilanter (TRELEGY ELLIPTA) 100-62.5-25 mcg inhaler Inhale 1 Dose by mouth into the lungs daily after dinner.    furosemide (LASIX) 40 mg tablet Take 2 tabs on Sunday-Tuesday- Thursday.  Take 3 tabs on Monday-Wednesday-Friday-Saturday (Patient taking differently: Take 2 tabs on Mon-Wed-Fri  Take 3 tabs on Sun-Tues-Thur-Sat)    gabapentin (NEURONTIN) 300 mg capsule TAKE 1 CAPSULE BY MOUTH THREE TIMES DAILY (Patient taking differently: Take one capsule by mouth four times daily.)    glucosamine su 2KCl-chondroit 500-400  mg tab Take 1 Tab by mouth twice daily.    guaiFENesin LA (MUCINEX) 600 mg tablet Take one tablet by mouth twice daily.    ipratropium bromide (ATROVENT) 42 mcg (0.06 %) nasal spray Apply two sprays to each nostril as directed twice daily as needed.    ketoconazole (NIZORAL) 2 % topical shampoo Apply  topically to affected area every 7 days. Apply topically to affected area of damp skin, lather, leave on 5 minutes, and rinse.    losartan (COZAAR) 50 mg tablet TAKE 1 TABLET BY MOUTH EVERY DAY (Patient taking differently: Take one tablet by mouth at bedtime daily.)    methocarbamoL (ROBAXIN) 500 mg tablet Take one tablet by mouth three times daily as needed for Spasms.    metoprolol succinate XL (TOPROL XL) 100 mg extended release tablet TAKE 1 TABLET BY MOUTH EVERY DAY    mucus clearing device (AEROBIKA OSCILLATING PEP SYSTM MISC) Use  as directed. Use with salt water and inhale twice daily    OMEGA-3 FATTY ACIDS-FISH OIL PO Take 1 capsule by mouth twice daily.    omeprazole DR (PRILOSEC) 40 mg capsule TAKE 1 CAPSULE BY MOUTH TWICE DAILY    other medication one Dose. Mupirocin dissolved in warm water and Budesonide in NS  qhs  (sometimes increases to BID)    polyethylene glycol 3350 (MIRALAX) 17 g packet Take one packet by mouth daily as needed.    potassium chloride SR (K-DUR) 20 mEq tablet TAKE 2 TABLETS BY MOUTH ON MONDAY, WEDNESDAY AND FRIDAY AND 1 TABLET ALL OTHER DAYS (Patient taking differently: TAKE 1 TABLETS BY MOUTH ON MONDAY, WEDNESDAY AND FRIDAY AND 1 TABLET TWICE DAILY ON OTHER DAYS)    pyRIDostigmine bromide (MESTINON) 60 mg tablet Take two tablets by mouth three times daily.      rosuvastatin (CRESTOR) 40 mg tablet TAKE 1 TABLET BY MOUTH AT BEDTIME    spironolactone (ALDACTONE) 25 mg tablet TAKE 1 TABLET BY MOUTH EVERY DAY WITH FOOD    tamsulosin (FLOMAX) 0.4 mg capsule Take one capsule by mouth daily.    testosterone cypionate 200 mg/mL kit Inject 1 mL into the muscle every 14 days.    traZODone (DESYREL) 100 mg tablet Take one tablet by mouth at bedtime daily.    Vacuum Erection Device System kit Use as directed for sexual activity.    warfarin (COUMADIN) 3 mg tablet Take one tablet by mouth at bedtime daily.     Vitals:    05/18/23 1112   BP: 117/66   BP Source: Arm, Left Upper   Pulse: 94   Temp: 36.8 ?C (98.2 ?F)   Resp: 18   SpO2: 97%  Comment: RA   TempSrc: Temporal   PainSc: Six   Weight: 105.7 kg (233 lb)   Height: 180.3 cm (5' 10.98)     Body mass index is 32.51 kg/m?Marland Kitchen     Physical Exam  Talking: Normal  Chewing: Normal  Swallowing: Normal  Breathing: Shortness of breath with exertion  Impairment of ability to brush teeth or comb hair: None  Impairment of ability to arise from chair: None  Double vision: Daily, but not constant  Eyelid droop: Constant  MG-ADL Score: 6       Swelling in the legs  Neurological examination:   Mental Status: Alert, oriented to time, place and person   CN II thru XII: Pupils 3mm, reactive to light and accommodation, intact extraocular movements, Diplopia on bilateral lateral gaze, Fatigable ptosis, mild intact facial  sensation,  intact hearing, symmetric palatal elevation, able to shrug shoulders equally on both sides, tongue midline.    Palpebral Fissure 5mm/8mm   Orb Oculi 5/5   Orb Oris 5/5   Tongue  5/5   .    Speech: No Dysarthria  Motor Exam:    Neck flexors: 5   Neck extensors: 5      Right Left  Right Left   Shoulder abductors: 4+, limited by pain 5 Hip flexion: 4+ 4+   Elbow flexors: 5 5 Hip abduction:     Elbow extensors: 5 5 Hip Adduction:     Wrist extensors: 5 5 Knee extension: 5 5   Wrist flexors: 5 5 Knee flexion: 5 5   Finger flexors: 5 5 Ankle dorsiflexion: 5 4+   Finger extensors: 5 5 Ankle plantar flexion: 5 5   Finger abductors: 5 5 Ankle eversion: 5 5   Thumb abductors: 5 5 Ankle inversion: 5 5      Toe extension:        Toe flexion:       Rigidity in UE, cogwheel. + bradykinesia    Sensory: light touch and pinprick intact    Bilateral resting and action tremor, more on the left.    Reflexes:   Reflex   Right   Left     Brachioradialis   2 2   Biceps    2 2   Triceps 2 2   Knee 2 2   Ankle   0 0   Jaw Jerk        Gait and Station: wide based antalgic due to the back pain stooped posture, with short steps   09/06/22 11:47   Acetylcholine binding AB 0.0   Acetylcholine Blocking AB 16          Assessment and Plan:  1) Seronegative MG, SFEMG positive MGFA 1, MGADL 6 today was 1 at prior visit.  Followed by  Dr Valarie Merino also , will comanage. Due to the minimal symptoms, will continue observation on Mestinon alone. We discussed about the risks vs benefits with other medications and he would like to continue on mestinon alone. Will consider other meds and further testing for other neuromuscular conditions if symptoms progress.  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    Bing Quarry, MD  Professor  Department of Neurology                    Total of 40 minutes were spent on the same day of the visit including preparing to see the patient, obtaining and/or reviewing separately obtained history, performing a medically appropriate examination and/or evaluation, counseling and educating the patient/family/caregiver, ordering medications, tests, or procedures, referring and communication with other health care professionals, documenting clinical information in the electronic or other health record, independently interpreting results and communicating results to the patient/family/caregiver, and care coordination.  Portions of this record were generated with Dragon, a dictation software. There may be typos or grammatical errors. Please contact my office for any documentation  clarifications.

## 2023-06-06 ENCOUNTER — Encounter: Admit: 2023-06-06 | Discharge: 2023-06-06 | Payer: MEDICARE

## 2023-06-19 ENCOUNTER — Encounter: Admit: 2023-06-19 | Discharge: 2023-06-19 | Payer: MEDICARE

## 2023-06-19 NOTE — Progress Notes
Patient was scheduled for a [employer] Carelink on 06/12/23 that has not been received. Patient was instructed to send a manual transmission. Instructed if the transmitter does not appear to be working properly, they need to contact the device company directly. Patient was provided with that contact number. Requested the patient send [place] a MyChart message or contact our device nurses at [phone] to let us know after they have sent their transmission. Mychart sent to pt. BC      Note: Patient needs to send a manual transmission as we did not receive their scheduled remote interrogation.

## 2023-06-20 ENCOUNTER — Encounter: Admit: 2023-06-20 | Discharge: 2023-06-20 | Payer: MEDICARE

## 2023-06-26 ENCOUNTER — Encounter: Admit: 2023-06-26 | Discharge: 2023-06-26 | Payer: MEDICARE

## 2023-06-27 ENCOUNTER — Ambulatory Visit: Admit: 2023-06-27 | Discharge: 2023-06-27 | Payer: MEDICARE

## 2023-06-27 ENCOUNTER — Encounter: Admit: 2023-06-27 | Discharge: 2023-06-27 | Payer: MEDICARE

## 2023-06-27 DIAGNOSIS — G259 Extrapyramidal and movement disorder, unspecified: Secondary | ICD-10-CM

## 2023-06-27 DIAGNOSIS — I509 Heart failure, unspecified: Secondary | ICD-10-CM

## 2023-06-27 DIAGNOSIS — N529 Male erectile dysfunction, unspecified: Secondary | ICD-10-CM

## 2023-06-27 DIAGNOSIS — M549 Dorsalgia, unspecified: Secondary | ICD-10-CM

## 2023-06-27 DIAGNOSIS — M1990 Unspecified osteoarthritis, unspecified site: Secondary | ICD-10-CM

## 2023-06-27 DIAGNOSIS — M25561 Pain in right knee: Secondary | ICD-10-CM

## 2023-06-27 DIAGNOSIS — R7303 Prediabetes: Secondary | ICD-10-CM

## 2023-06-27 DIAGNOSIS — I10 Essential (primary) hypertension: Secondary | ICD-10-CM

## 2023-06-27 DIAGNOSIS — K219 Gastro-esophageal reflux disease without esophagitis: Secondary | ICD-10-CM

## 2023-06-27 DIAGNOSIS — G589 Mononeuropathy, unspecified: Secondary | ICD-10-CM

## 2023-06-27 DIAGNOSIS — R259 Unspecified abnormal involuntary movements: Secondary | ICD-10-CM

## 2023-06-27 DIAGNOSIS — I2510 Atherosclerotic heart disease of native coronary artery without angina pectoris: Secondary | ICD-10-CM

## 2023-06-27 DIAGNOSIS — G4733 Obstructive sleep apnea (adult) (pediatric): Secondary | ICD-10-CM

## 2023-06-27 DIAGNOSIS — M2550 Pain in unspecified joint: Secondary | ICD-10-CM

## 2023-06-27 DIAGNOSIS — J302 Other seasonal allergic rhinitis: Secondary | ICD-10-CM

## 2023-06-27 DIAGNOSIS — R053 Chronic cough: Secondary | ICD-10-CM

## 2023-06-27 DIAGNOSIS — M51369 Degenerative disc disease, lumbar: Secondary | ICD-10-CM

## 2023-06-27 DIAGNOSIS — Z95810 Presence of automatic (implantable) cardiac defibrillator: Secondary | ICD-10-CM

## 2023-06-27 DIAGNOSIS — I340 Nonrheumatic mitral (valve) insufficiency: Secondary | ICD-10-CM

## 2023-06-27 DIAGNOSIS — H9190 Unspecified hearing loss, unspecified ear: Secondary | ICD-10-CM

## 2023-06-27 DIAGNOSIS — G20A1 Parkinson disease (HCC): Secondary | ICD-10-CM

## 2023-06-27 DIAGNOSIS — G5600 Carpal tunnel syndrome, unspecified upper limb: Secondary | ICD-10-CM

## 2023-06-27 DIAGNOSIS — K449 Diaphragmatic hernia without obstruction or gangrene: Secondary | ICD-10-CM

## 2023-06-27 DIAGNOSIS — I255 Ischemic cardiomyopathy: Secondary | ICD-10-CM

## 2023-06-27 DIAGNOSIS — D699 Hemorrhagic condition, unspecified: Secondary | ICD-10-CM

## 2023-06-27 DIAGNOSIS — M792 Neuralgia and neuritis, unspecified: Secondary | ICD-10-CM

## 2023-06-27 DIAGNOSIS — J449 Chronic obstructive pulmonary disease, unspecified: Secondary | ICD-10-CM

## 2023-06-27 DIAGNOSIS — I219 Acute myocardial infarction, unspecified: Secondary | ICD-10-CM

## 2023-06-27 DIAGNOSIS — IMO0002 Ulcer: Secondary | ICD-10-CM

## 2023-06-27 DIAGNOSIS — I4891 Unspecified atrial fibrillation: Secondary | ICD-10-CM

## 2023-06-27 DIAGNOSIS — G7000 Myasthenia gravis without (acute) exacerbation: Secondary | ICD-10-CM

## 2023-06-27 DIAGNOSIS — E66811 Obesity, Class I, 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): Secondary | ICD-10-CM

## 2023-06-27 DIAGNOSIS — K5900 Constipation, unspecified: Secondary | ICD-10-CM

## 2023-06-27 DIAGNOSIS — T148XXA Other injury of unspecified body region, initial encounter: Secondary | ICD-10-CM

## 2023-06-27 DIAGNOSIS — H269 Unspecified cataract: Secondary | ICD-10-CM

## 2023-06-27 DIAGNOSIS — M4800 Spinal stenosis, site unspecified: Secondary | ICD-10-CM

## 2023-06-27 DIAGNOSIS — N289 Disorder of kidney and ureter, unspecified: Secondary | ICD-10-CM

## 2023-06-27 DIAGNOSIS — E785 Hyperlipidemia, unspecified: Secondary | ICD-10-CM

## 2023-06-27 MED ORDER — LIDOCAINE (PF) 20 MG/ML (2 %) IJ SOLN
10 mL | Freq: Once | INTRAMUSCULAR | 0 refills | Status: CP
Start: 2023-06-27 — End: ?

## 2023-06-27 MED ORDER — CEFAZOLIN INJ 1GM IVP
2 g | Freq: Once | INTRAVENOUS | 0 refills | Status: CP
Start: 2023-06-27 — End: ?

## 2023-06-27 NOTE — Progress Notes
Patient has been talking with trial rep, once done they will changed and be discharged.

## 2023-06-27 NOTE — Progress Notes
[facility]  INTERVENTIONAL PAIN PROCEDURE HISTORY AND PHYSICAL    Chief Complaint: Pain    HISTORY OF PRESENT ILLNESS:  Right knee pain. Failed conservative treatment.     Past Medical History:   Diagnosis Date    Abnormal involuntary movement 1994    Atrial fibrillation (HCC) 02/08/2009    Back pain     Bleeding disorder (HCC) when started Xarelto    CAD (coronary artery disease) 02/08/2009    Carpal tunnel syndrome     Cataract 2017    both removed    Chronic cough     Congestive heart disease (HCC)     Constipation 2015    COPD (chronic obstructive pulmonary disease) (HCC)     Degenerative disc disease, lumbar 2010    Erectile dysfunction, vasculogenic     Fracture     GERD (gastroesophageal reflux disease)     controlled with elevated HOB, protonix and pepcid     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 now    Heart attack (HCC) possible in 2006    Hiatal hernia     Hyperlipemia 02/11/2009    Hypertension 02/11/2009    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    Joint pain 1966    Kidney disease 2019    Mitral regurgitation     Movement disorder 2019    Myasthenia gravis (HCC) 2023    Myocardial infarction (HCC) 2006    Obesity, Class I, 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)     OSA on CPAP 02/11/2009    Osteoarthritis     Parkinson disease (HCC) 2022    Pre-diabetes     S/P ICD (internal cardiac defibrillator) procedure 02/11/2009    Seasonal allergies     Spinal stenosis 2010    Ulcer        Surgical History:   Procedure Laterality Date    HUMERUS FRACTURE SURGERY Left 2004    MITRAL VALVULOPLASTY  2006    same time as bypass    CORONARY ARTERY BYPASS GRAFT  04/14/2005    CABGx5 LIMA-LAD, L Rad-Dx, sSVG-RCA-RCA, sSVG-OM & MV Plasty for MR:    RHYTHM DEVICE PLACEMENT  2007    defibrillator    FOOT SURGERY Bilateral 2012    Tarsal tunnel release w/ bunionectomy and hammer toe     EAR SURGERY Bilateral 2012    Ear Tubes    SINUS SURGERY  10/2011    CARDIOVASCULAR STRESS TEST  09/2013    KNEE REPLACEMENT Right 05/2014    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    Performed by Simonne Maffucci, MD at [facility] OR    TRANSVENOUS REMOVAL IMPLANTABLE DEFIBRILLATOR RIGHT VENTRICULAR LEAD Left 03/13/2018    Performed by Kathreen Cornfield, MD at [facility] CVOR    INSERTION RIGHT VENTRICULAR LEAD Left 03/13/2018    Performed by Kathreen Cornfield, MD at [facility] CVOR    FLUOROSCOPY - CARDIAC  03/13/2018    Performed by Kathreen Cornfield, MD at [facility] CVOR    INSERTION/ REPLACEMENT TEMPORARY PACEMAKER LEAD/ CATHETER  03/13/2018    Performed by Kathreen Cornfield, MD at [facility] CVOR    REMOVAL AND REPLACEMENT IMPLANTABLE DEFIBRILLATOR GENERATOR - DUAL LEAD SYSTEM  03/13/2018    Performed by Kathreen Cornfield, MD at [facility] CVOR    CARDIOTHORACIC SURGERY STANDBY N/A 03/13/2018    Performed by Cath, Physician at [facility] CVOR    INTRACARDIAC CATHETER ABLATION WITH  COMPREHENSIVE ELECTROPHYSIOLOGIC EVALUATION - ATRIAL FIBRILLATION, RADIOFREQUENCY N/A 11/22/2018    Performed by Kathreen Cornfield, MD at [facility] EP LAB    INTRACARDIAC ELECTROPHYSIOLOGIC 3-DIMENSIONAL MAPPING N/A 11/22/2018    Performed by Kathreen Cornfield, MD at [facility] EP LAB    INTRACARDIAC ECHOCARDIOGRAPHY N/A 11/22/2018    Performed by Kathreen Cornfield, MD at [facility] EP LAB    POSSIBLE INTRAVENOUS DRUG INFUSION FOR STIMULATION AND PACING N/A 11/22/2018    Performed by Kathreen Cornfield, MD at [facility] EP LAB    POSSIBLE INSERTION ESOPHAGEAL DEVIATOR N/A 11/22/2018    Performed by Kathreen Cornfield, MD at [facility] EP LAB    POSSIBLE EXTERNAL CARDIOVERSION N/A 11/22/2018    Performed by Kathreen Cornfield, MD at [facility] EP LAB    INTRACARDIAC CATHETER ABLATION WITH COMPREHENSIVE ELECTROPHYSIOLOGIC EVALUATION - ATYPICAL FLUTTER Right 11/22/2018    Performed by Kathreen Cornfield, MD at [facility] EP LAB    TRANSESOPHAGEAL ECHOCARDIOGRAM DURING INTERVENTION N/A 11/22/2018    Performed by Cath, Physician at [facility] EP LAB    INSERTION SPINAL NEUROSTIMULATOR PULSE GENERATOR/ RECEIVER N/A 11/17/2021    Performed by Evelina Bucy, MD at [facility] OR    PERCUTANEOUS IMPLANTATION EPIDURAL NEUROSTIMULATOR ELECTRODE ARRAY x2 N/A 11/17/2021    Performed by Evelina Bucy, MD at [facility] OR    ELECTRONIC ANALYSIS IMPLANTED NEUROSTIMULATOR PULSE GENERATOR SYSTEM - COMPLEX SPINAL CORD/ PERIPHERAL NEUROSTIMULATOR PULSE GENERATOR/ TRANSMITTER WITH INTRA OPERATIVE/ SUBSEQUENT PROGRAMING N/A 11/17/2021    Performed by Evelina Bucy, MD at [facility] OR    SIGMOIDOSCOPY DIAGNOSTIC WITH COLLECTION SPECIMEN BY BRUSHING/ WASHING - FLEXIBLE N/A 09/30/2022    Performed by Jolee Ewing, MD at [facility] ENDO    ESOPHAGOGASTRODUODENOSCOPY WITH SPECIMEN COLLECTION BY BRUSHING/ WASHING N/A 09/30/2022    Performed by Jolee Ewing, MD at [facility] ENDO    EXAM ANORECTAL UNDER ANESTHESIA, HEMMORHOIDECTOMY, EXCISION OF ANAL LESION N/A 02/27/2023    Performed by Evalina Field, MD at [facility] OR    . N/A 02/27/2023    Performed by Evalina Field, MD at [facility] OR    . N/A 02/27/2023    Performed by Evalina Field, MD at [facility] OR    CARDIAC DEFIBRILLATOR PLACEMENT  11/2013, 02-2018    S/P Fidel[employer] ICD Lead Extraction and New Lead Implantation    CARPAL TUNNEL RELEASE  1996    HX BACK SURGERY  2013, 2006    Lumbart Laminectomy and Fusion    HX CARPAL TUNNEL RELEASE Right 1990s    HX CORONARY ARTERY BYPASS GRAFT  2006    HX ENDOSCOPY  2019    HX HEART CATHETERIZATION      HX HEMORRHOIDECTOMY  2021, 2022    KNEE SURGERY  2015, 2016, 2020    SURGERY  2010    UMBILICAL ARTERIAL CATH - BEDSIDE  2006    UPPER GASTROINTESTINAL ENDOSCOPY         family history includes Cancer in his father, mother, and sister; Heart Disease in his father; Heart problem in his father; Hypertension in his mother; Joint Pain in his mother.    Social History     Socioeconomic History    Marital status: Married   Tobacco Use    Smoking status: Never    Smokeless tobacco: Never   Vaping Use    Vaping status: Never Used   Substance and Sexual Activity    Alcohol use: Not Currently     Comment: very rarely 4 drinks per year  Drug use: No Sexual activity: Yes     Partners: Female     Birth control/protection: None       Allergies   Allergen Reactions    Clarithromycin RASH     Redden and burns both ends of GI tract    Ketoconazole SEE COMMENTS     Had bloodshot eyes after use, and sore.  Happens if he gets drug in his eyes.  Tolerates shampoo fine.    Lisinopril COUGH    Oxycodone UNKNOWN     Nightmares  Tolerates Tramadol       There were no vitals filed for this visit.          REVIEW OF SYSTEMS: 10 point ROS obtained and negative      PHYSICAL EXAM:    General: Alert, cooperative, no acute distress.  HEENT: Normocephalic, atraumatic.  Neck: Supple.  Lungs: Unlabored respirations, bilateral and equal chest excursion.  Heart: Regular rate.  Skin: Warm and dry to touch.  Abdomen: Nondistended.  MSK: No deformity.  Neurological: Alert and oriented x3.        IMPRESSION:    1. Mononeuropathy         PLAN: Other Right knee genicular nerve PNS Trial ([place])

## 2023-06-27 NOTE — Procedures
INTERVENTIONAL PAIN MANAGEMENT PROCEDURE REPORT      Peripheral Nerve Stimulator Trial  Matthieu Jaggers  [id number]  Nov 26, 1945       Date of Service:  06/27/2023      Procedure Title(s):                1. Percutaneous placement of peripheral nerve stimulator leads x 2 ([place] Peripheral Nerve Stimulator Trial)               2. Fluoroscopic needle guidance     Attending Surgeon: Evelina Bucy, MD     Pre-Procedure Diagnosis:   1. Mononeuropathy  ceFAZolin (ANCEF) IVP 2 g    lidocaine (PF) 20 mg/mL (2 %) injection 200 mg    PERIPHERAL NERVE STIMULATOR TRIAL    PERIPHERAL NERVE STIMULATOR TRIAL      2. Chronic pain of right knee  PERIPHERAL NERVE STIMULATOR TRIAL    PERIPHERAL NERVE STIMULATOR TRIAL      3. Neuropathic pain  PERIPHERAL NERVE STIMULATOR TRIAL    PERIPHERAL NERVE STIMULATOR TRIAL          Post-Procedure Diagnosis:   1. Mononeuropathy  ceFAZolin (ANCEF) IVP 2 g    lidocaine (PF) 20 mg/mL (2 %) injection 200 mg    PERIPHERAL NERVE STIMULATOR TRIAL    PERIPHERAL NERVE STIMULATOR TRIAL      2. Chronic pain of right knee  PERIPHERAL NERVE STIMULATOR TRIAL    PERIPHERAL NERVE STIMULATOR TRIAL      3. Neuropathic pain  PERIPHERAL NERVE STIMULATOR TRIAL    PERIPHERAL NERVE STIMULATOR TRIAL           Anesthesia: Local                       Anxiolysis No                       Procedural Sedation No     Indications: The patient has failed more conservative measures such as physical therapy, medication management and is currently a non-operative candidate. The patient's history and physical exam were reviewed. The risks, benefits and alternatives to the procedure were discussed, and all questions were answered to the patient's satisfaction. The patient agreed to proceed, and written informed consent was obtained.      Procedure in Detail: IV was started? Yes     The patient was brought into the procedure room and seated on the table. Standard monitors were placed, and vital signs were observed throughout the procedure. The area of the right knee was prepped with chlorhexidine and draped in a sterile manner.      The location of the right superomedial and superolateral genicular nerves was identified under fluoroscopy.     The needle was placed. The stylet was removed and the lead was introduced. The needle was then removed leaving the lead in place at the right superomedial genicular nerve.     The same technique was used to place a lead at the right superolateral genicular nerve.     Leads were fixed with a suture and Stayfix at each site and then attached to the stimulation device. Areas were secured with dressings and tape.      Several combinations of programming were used until comfortable, appropriate coverage of the patient's pain area was obtained.      Disposition: The patient tolerated the procedure well, and there were no apparent complications. Vital signs remained stable througout the procedure. The  patient was taken to the recovery area where discharge instructions for the procedure were given. The patient will return to the clinic in one week for follow up.      Estimated Blood Loss: minimal     Specimens: none     Complications: None

## 2023-07-04 ENCOUNTER — Encounter: Admit: 2023-07-04 | Discharge: 2023-07-04 | Payer: MEDICARE

## 2023-07-04 ENCOUNTER — Ambulatory Visit: Admit: 2023-07-04 | Discharge: 2023-07-05 | Payer: MEDICARE

## 2023-07-04 DIAGNOSIS — I4891 Unspecified atrial fibrillation: Secondary | ICD-10-CM

## 2023-07-04 DIAGNOSIS — R053 Chronic cough: Secondary | ICD-10-CM

## 2023-07-04 DIAGNOSIS — H9190 Unspecified hearing loss, unspecified ear: Secondary | ICD-10-CM

## 2023-07-04 DIAGNOSIS — J449 Chronic obstructive pulmonary disease, unspecified: Secondary | ICD-10-CM

## 2023-07-04 DIAGNOSIS — Z01818 Encounter for other preprocedural examination: Secondary | ICD-10-CM

## 2023-07-04 DIAGNOSIS — K219 Gastro-esophageal reflux disease without esophagitis: Secondary | ICD-10-CM

## 2023-07-04 DIAGNOSIS — I219 Acute myocardial infarction, unspecified: Secondary | ICD-10-CM

## 2023-07-04 DIAGNOSIS — I255 Ischemic cardiomyopathy: Secondary | ICD-10-CM

## 2023-07-04 DIAGNOSIS — I10 Essential (primary) hypertension: Secondary | ICD-10-CM

## 2023-07-04 DIAGNOSIS — G20A1 Parkinson disease (HCC): Secondary | ICD-10-CM

## 2023-07-04 DIAGNOSIS — J302 Other seasonal allergic rhinitis: Secondary | ICD-10-CM

## 2023-07-04 DIAGNOSIS — I2510 Atherosclerotic heart disease of native coronary artery without angina pectoris: Secondary | ICD-10-CM

## 2023-07-04 DIAGNOSIS — G7000 Myasthenia gravis without (acute) exacerbation: Secondary | ICD-10-CM

## 2023-07-04 DIAGNOSIS — M1990 Unspecified osteoarthritis, unspecified site: Secondary | ICD-10-CM

## 2023-07-04 DIAGNOSIS — K449 Diaphragmatic hernia without obstruction or gangrene: Secondary | ICD-10-CM

## 2023-07-04 DIAGNOSIS — G4733 Obstructive sleep apnea (adult) (pediatric): Secondary | ICD-10-CM

## 2023-07-04 DIAGNOSIS — M51369 Degenerative disc disease, lumbar: Secondary | ICD-10-CM

## 2023-07-04 DIAGNOSIS — E785 Hyperlipidemia, unspecified: Secondary | ICD-10-CM

## 2023-07-04 DIAGNOSIS — I509 Heart failure, unspecified: Secondary | ICD-10-CM

## 2023-07-04 DIAGNOSIS — D699 Hemorrhagic condition, unspecified: Secondary | ICD-10-CM

## 2023-07-04 DIAGNOSIS — K5900 Constipation, unspecified: Secondary | ICD-10-CM

## 2023-07-04 DIAGNOSIS — G5600 Carpal tunnel syndrome, unspecified upper limb: Secondary | ICD-10-CM

## 2023-07-04 DIAGNOSIS — G259 Extrapyramidal and movement disorder, unspecified: Secondary | ICD-10-CM

## 2023-07-04 DIAGNOSIS — N529 Male erectile dysfunction, unspecified: Secondary | ICD-10-CM

## 2023-07-04 DIAGNOSIS — T148XXA Other injury of unspecified body region, initial encounter: Secondary | ICD-10-CM

## 2023-07-04 DIAGNOSIS — H269 Unspecified cataract: Secondary | ICD-10-CM

## 2023-07-04 DIAGNOSIS — R7303 Prediabetes: Secondary | ICD-10-CM

## 2023-07-04 DIAGNOSIS — M549 Dorsalgia, unspecified: Secondary | ICD-10-CM

## 2023-07-04 DIAGNOSIS — E66811 Obesity, Class I, 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): Secondary | ICD-10-CM

## 2023-07-04 DIAGNOSIS — M2550 Pain in unspecified joint: Secondary | ICD-10-CM

## 2023-07-04 DIAGNOSIS — M4800 Spinal stenosis, site unspecified: Secondary | ICD-10-CM

## 2023-07-04 DIAGNOSIS — R259 Unspecified abnormal involuntary movements: Secondary | ICD-10-CM

## 2023-07-04 DIAGNOSIS — IMO0002 Ulcer: Secondary | ICD-10-CM

## 2023-07-04 DIAGNOSIS — Z95810 Presence of automatic (implantable) cardiac defibrillator: Secondary | ICD-10-CM

## 2023-07-04 DIAGNOSIS — I340 Nonrheumatic mitral (valve) insufficiency: Secondary | ICD-10-CM

## 2023-07-04 DIAGNOSIS — N289 Disorder of kidney and ureter, unspecified: Secondary | ICD-10-CM

## 2023-07-04 MED ORDER — LOSARTAN 50 MG PO TAB
50 mg | ORAL_TABLET | Freq: Every day | ORAL | 0 refills | 90.00000 days | Status: AC
Start: 2023-07-04 — End: ?

## 2023-07-05 DIAGNOSIS — Z01818 Encounter for other preprocedural examination: Secondary | ICD-10-CM

## 2023-07-08 ENCOUNTER — Encounter: Admit: 2023-07-08 | Discharge: 2023-07-08 | Payer: MEDICARE

## 2023-07-11 ENCOUNTER — Encounter: Admit: 2023-07-11 | Discharge: 2023-07-11 | Payer: MEDICARE

## 2023-07-11 ENCOUNTER — Ambulatory Visit: Admit: 2023-07-11 | Discharge: 2023-07-11 | Payer: MEDICARE

## 2023-07-11 DIAGNOSIS — M25561 Pain in right knee: Secondary | ICD-10-CM

## 2023-07-11 DIAGNOSIS — G589 Mononeuropathy, unspecified: Secondary | ICD-10-CM

## 2023-07-11 DIAGNOSIS — M792 Neuralgia and neuritis, unspecified: Secondary | ICD-10-CM

## 2023-07-12 ENCOUNTER — Encounter: Admit: 2023-07-12 | Discharge: 2023-07-12 | Payer: MEDICARE

## 2023-07-13 ENCOUNTER — Encounter: Admit: 2023-07-13 | Discharge: 2023-07-13 | Payer: MEDICARE

## 2023-07-13 NOTE — Telephone Encounter
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 NSVT Listed in problem list.   Please review the attached summary and IB/teams me if you have any questions.     Thank you,  Cheri Guppy

## 2023-07-13 NOTE — Telephone Encounter
Attempted to contact patient and primary contact, Leta Jungling (wife), by phone but no answer. Left message to call back.

## 2023-07-17 ENCOUNTER — Encounter: Admit: 2023-07-17 | Discharge: 2023-07-17 | Payer: MEDICARE

## 2023-07-17 NOTE — Telephone Encounter
Pt. Left VM to find out if all was cleared for 07/19/23 OR procedure for her spouse Quaveon Zavada.  I called pt. Back and she said someone from scheduling had already called to confirm.

## 2023-07-18 ENCOUNTER — Encounter: Admit: 2023-07-18 | Discharge: 2023-07-18 | Payer: MEDICARE

## 2023-07-19 ENCOUNTER — Ambulatory Visit: Admit: 2023-07-19 | Discharge: 2023-07-20 | Payer: MEDICARE

## 2023-07-19 ENCOUNTER — Encounter: Admit: 2023-07-19 | Discharge: 2023-07-19 | Payer: MEDICARE

## 2023-07-19 ENCOUNTER — Ambulatory Visit: Admit: 2023-07-19 | Discharge: 2023-07-19 | Payer: MEDICARE

## 2023-07-19 MED ORDER — CEFAZOLIN 1 GRAM IJ SOLR
INTRAVENOUS | 0 refills | Status: DC
Start: 2023-07-19 — End: 2023-07-19

## 2023-07-19 MED ORDER — DEXMEDETOMIDINE IN 0.9 % NACL 80 MCG/20 ML (4 MCG/ML) IV SOLN
INTRAVENOUS | 0 refills | Status: DC
Start: 2023-07-19 — End: 2023-07-19

## 2023-07-19 MED ORDER — CEPHALEXIN 500 MG PO CAP
500 mg | ORAL_CAPSULE | Freq: Four times a day (QID) | ORAL | 0 refills | Status: AC
Start: 2023-07-19 — End: ?

## 2023-07-19 MED ORDER — MIDAZOLAM 1 MG/ML IJ SOLN
INTRAVENOUS | 0 refills | Status: DC
Start: 2023-07-19 — End: 2023-07-19

## 2023-07-19 MED ORDER — FENTANYL CITRATE (PF) 50 MCG/ML IJ SOLN
INTRAVENOUS | 0 refills | Status: DC
Start: 2023-07-19 — End: 2023-07-19

## 2023-07-19 MED ORDER — TRAMADOL 50 MG PO TAB
50 mg | ORAL_TABLET | ORAL | 0 refills | Status: AC | PRN
Start: 2023-07-19 — End: ?

## 2023-07-20 ENCOUNTER — Encounter: Admit: 2023-07-20 | Discharge: 2023-07-20 | Payer: MEDICARE

## 2023-07-24 ENCOUNTER — Ambulatory Visit: Admit: 2023-07-24 | Discharge: 2023-07-24 | Payer: MEDICARE

## 2023-07-24 ENCOUNTER — Encounter: Admit: 2023-07-24 | Discharge: 2023-07-24 | Payer: MEDICARE

## 2023-07-26 ENCOUNTER — Encounter: Admit: 2023-07-26 | Discharge: 2023-07-26 | Payer: MEDICARE

## 2023-07-26 ENCOUNTER — Ambulatory Visit: Admit: 2023-07-26 | Discharge: 2023-07-27 | Payer: MEDICARE

## 2023-07-26 DIAGNOSIS — M25561 Pain in right knee: Secondary | ICD-10-CM

## 2023-07-26 DIAGNOSIS — M792 Neuralgia and neuritis, unspecified: Secondary | ICD-10-CM

## 2023-07-26 DIAGNOSIS — G589 Mononeuropathy, unspecified: Secondary | ICD-10-CM

## 2023-07-26 NOTE — Progress Notes
SPINE CENTER  INTERVENTIONAL PAIN PROCEDURE HISTORY AND PHYSICAL    Chief Complaint: Pain    HISTORY OF PRESENT ILLNESS:  Patient follows up after genicular nerve PNS (Nalu) implant. Was sore, but gradually improving. No fevers. Doing well overall. No issues.     Past Medical History:   Diagnosis Date    Abnormal involuntary movement 1994    Atrial fibrillation (HCC) 02/08/2009    Back pain     Bleeding disorder (HCC) when started Xarelto    CAD (coronary artery disease) 02/08/2009    Carpal tunnel syndrome     Cataract 2017    both removed    Chronic cough     Congestive heart disease (HCC)     Constipation 2015    COPD (chronic obstructive pulmonary disease) (HCC)     Degenerative disc disease, lumbar 2010    Erectile dysfunction, vasculogenic     Fracture     GERD (gastroesophageal reflux disease)     controlled with elevated HOB, protonix and pepcid     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 now    Heart attack (HCC) possible in 2006    Hiatal hernia     Hyperlipemia 02/11/2009    Hypertension 02/11/2009    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    Joint pain 1966    Kidney disease 2019    Mitral regurgitation     Movement disorder 2019    Myasthenia gravis (HCC) 2023    Myocardial infarction (HCC) 2006    Obesity, Class I, 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)     OSA on CPAP 02/11/2009    Osteoarthritis     Parkinson disease (HCC) 2022    Pre-diabetes     S/P ICD (internal cardiac defibrillator) procedure 02/11/2009    Seasonal allergies     Spinal stenosis 2010    Ulcer        Surgical History:   Procedure Laterality Date    HUMERUS FRACTURE SURGERY Left 2004    MITRAL VALVULOPLASTY  2006    same time as bypass    CORONARY ARTERY BYPASS GRAFT  04/14/2005    CABGx5 LIMA-LAD, L Rad-Dx, sSVG-RCA-RCA, sSVG-OM & MV Plasty for MR:    RHYTHM DEVICE PLACEMENT  2007    defibrillator    FOOT SURGERY Bilateral 2012    Tarsal tunnel release w/ bunionectomy and hammer toe EAR SURGERY Bilateral 2012    Ear Tubes    SINUS SURGERY  10/2011    CARDIOVASCULAR STRESS TEST  09/2013    KNEE REPLACEMENT Right 05/2014    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    Performed by Simonne Maffucci, MD at [facility] OR    TRANSVENOUS REMOVAL IMPLANTABLE DEFIBRILLATOR RIGHT VENTRICULAR LEAD Left 03/13/2018    Performed by Kathreen Cornfield, MD at [facility] CVOR    INSERTION RIGHT VENTRICULAR LEAD Left 03/13/2018    Performed by Kathreen Cornfield, MD at [facility] CVOR    FLUOROSCOPY - CARDIAC  03/13/2018    Performed by Kathreen Cornfield, MD at [facility] CVOR    INSERTION/ REPLACEMENT TEMPORARY PACEMAKER LEAD/ CATHETER  03/13/2018    Performed by Kathreen Cornfield, MD at [facility] CVOR    REMOVAL AND REPLACEMENT IMPLANTABLE DEFIBRILLATOR GENERATOR - DUAL LEAD SYSTEM  03/13/2018    Performed by Kathreen Cornfield, MD at [facility] CVOR    CARDIOTHORACIC SURGERY STANDBY N/A 03/13/2018    Performed by Cath,  Physician at [facility] CVOR    INTRACARDIAC CATHETER ABLATION WITH COMPREHENSIVE ELECTROPHYSIOLOGIC EVALUATION - ATRIAL FIBRILLATION, RADIOFREQUENCY N/A 11/22/2018    Performed by Kathreen Cornfield, MD at [facility] EP LAB    INTRACARDIAC ELECTROPHYSIOLOGIC 3-DIMENSIONAL MAPPING N/A 11/22/2018    Performed by Kathreen Cornfield, MD at [facility] EP LAB    INTRACARDIAC ECHOCARDIOGRAPHY N/A 11/22/2018    Performed by Kathreen Cornfield, MD at [facility] EP LAB    POSSIBLE INTRAVENOUS DRUG INFUSION FOR STIMULATION AND PACING N/A 11/22/2018    Performed by Kathreen Cornfield, MD at [facility] EP LAB    POSSIBLE INSERTION ESOPHAGEAL DEVIATOR N/A 11/22/2018    Performed by Kathreen Cornfield, MD at [facility] EP LAB    POSSIBLE EXTERNAL CARDIOVERSION N/A 11/22/2018    Performed by Kathreen Cornfield, MD at [facility] EP LAB    INTRACARDIAC CATHETER ABLATION WITH COMPREHENSIVE ELECTROPHYSIOLOGIC EVALUATION - ATYPICAL FLUTTER Right 11/22/2018    Performed by Kathreen Cornfield, MD at [facility] EP LAB    TRANSESOPHAGEAL ECHOCARDIOGRAM DURING INTERVENTION N/A 11/22/2018    Performed by Cath, Physician at [facility] EP LAB INSERTION SPINAL NEUROSTIMULATOR PULSE GENERATOR/ RECEIVER N/A 11/17/2021    Performed by Evelina Bucy, MD at [facility] OR    PERCUTANEOUS IMPLANTATION EPIDURAL NEUROSTIMULATOR ELECTRODE ARRAY x2 N/A 11/17/2021    Performed by Evelina Bucy, MD at [facility] OR    ELECTRONIC ANALYSIS IMPLANTED NEUROSTIMULATOR PULSE GENERATOR SYSTEM - COMPLEX SPINAL CORD/ PERIPHERAL NEUROSTIMULATOR PULSE GENERATOR/ TRANSMITTER WITH INTRA OPERATIVE/ SUBSEQUENT PROGRAMING N/A 11/17/2021    Performed by Evelina Bucy, MD at [facility] OR    SIGMOIDOSCOPY DIAGNOSTIC WITH COLLECTION SPECIMEN BY BRUSHING/ WASHING - FLEXIBLE N/A 09/30/2022    Performed by Jolee Ewing, MD at [facility] ENDO    ESOPHAGOGASTRODUODENOSCOPY WITH SPECIMEN COLLECTION BY BRUSHING/ WASHING N/A 09/30/2022    Performed by Jolee Ewing, MD at [facility] ENDO    EXAM ANORECTAL UNDER ANESTHESIA, HEMMORHOIDECTOMY, EXCISION OF ANAL LESION N/A 02/27/2023    Performed by Evalina Field, MD at [facility] OR    . N/A 02/27/2023    Performed by Evalina Field, MD at [facility] OR    . N/A 02/27/2023    Performed by Evalina Field, MD at [facility] OR    *Nalu PNS* INSERTION/ REPLACEMENT PERIPHERAL/ SACRAL/ GASTRIC NEUROSTIMULATOR PULSE GENERATOR/ RECEIVER/ REQUIRING POCKET CREATION/ CONNECTION ELECTRODE GENERATOR/ RECEIVER Right 07/19/2023    Performed by Evelina Bucy, MD at [facility] OR    PERCUTANEOUS IMPLANTATION NEUROSTIMULATOR ELECTRODE ARRAY - PERIPHERAL NERVE N/A 07/19/2023    Performed by Evelina Bucy, MD at [facility] OR    ELECTRONIC ANALYSIS IMPLANTED NEUROSTIMULATOR PULSE GENERATOR SYSTEM - COMPLEX SPINAL CORD/ PERIPHERAL NEUROSTIMULATOR PULSE GENERATOR/ TRANSMITTER WITH INTRA OPERATIVE/ SUBSEQUENT PROGRAMING N/A 07/19/2023    Performed by Evelina Bucy, MD at [facility] OR    ULTRASOUND GUIDANCE FOR NEEDLE PLACEMENT N/A 07/19/2023    Performed by Evelina Bucy, MD at [facility] OR    CARDIAC DEFIBRILLATOR PLACEMENT  11/2013, 02-2018    S/P Fidelis ICD Lead Extraction and New Lead Implantation    CARPAL TUNNEL RELEASE  1996    HX BACK SURGERY  2013, 2006    Lumbart Laminectomy and Fusion    HX CARPAL TUNNEL RELEASE Right 1990s    HX CORONARY ARTERY BYPASS GRAFT  2006    HX ENDOSCOPY  2019    HX HEART CATHETERIZATION      HX HEMORRHOIDECTOMY  2021, 2022    KNEE SURGERY  2015, 2016, 2020  SURGERY  2010    UMBILICAL ARTERIAL CATH - BEDSIDE  2006    UPPER GASTROINTESTINAL ENDOSCOPY         family history includes Cancer in his father, mother, and sister; Heart Disease in his father; Heart problem in his father; Hypertension in his mother; Joint Pain in his mother.    Social History     Socioeconomic History    Marital status: Married   Tobacco Use    Smoking status: Never    Smokeless tobacco: Never   Vaping Use    Vaping status: Never Used   Substance and Sexual Activity    Alcohol use: Not Currently     Comment: very rarely 4 drinks per year    Drug use: No    Sexual activity: Yes     Partners: Female     Birth control/protection: None       Allergies   Allergen Reactions    Clarithromycin RASH     Redden and burns both ends of GI tract    Ketoconazole SEE COMMENTS     Had bloodshot eyes after use, and sore.  Happens if he gets drug in his eyes.  Tolerates shampoo fine.    Lisinopril COUGH    Oxycodone UNKNOWN     Nightmares  Tolerates Tramadol       Vitals:    07/26/23 1010   BP: (P) 124/72   Pulse: (P) 89   SpO2: 99%   PainSc: (P) Five   Weight: 105.7 kg (233 lb)   Height: 177.8 cm (5' 10)     Pain Score: (P) Five  Oswestry Total Score:: 36    REVIEW OF SYSTEMS: 10 point ROS obtained and negative      PHYSICAL EXAM:    General: Alert, cooperative, no acute distress.  HEENT: Normocephalic, atraumatic.  Neck: Supple.  Lungs: Unlabored respirations, bilateral and equal chest excursion.  Heart: Regular rate.  Skin: Warm and dry to touch.  Abdomen: Nondistended.  MSK: No deformity.  Neurological: Alert and oriented x3.     Well-healing incisions on medial and lateral aspects of distal right thigh. A couple small areas a few mm wide that look like they are continuing to heal.   No erythema or fluctuance or TTP.   Area cleansed with chlorhexidine and tegaderm applied.        IMPRESSION:    1. Mononeuropathy    2. Chronic pain of right knee    3. Neuropathic pain         PLAN:     Advised to keep area dry.   Can take showers once staples are out.   Keep out of submerged water for at least 3 more weeks.   Will bring back next week to reassess incision healing and remove staples at that time. Can place steristrips then.

## 2023-07-26 NOTE — Patient Instructions
General Instructions:  How to reach me: Please send a MyChart message to the Spine Center or leave a voicemail for my nurse Lurena Joiner at [phone].  Scheduling: Our scheduling phone number is [phone]. You can also message scheduling through MyChart.   How to get a medication refill: Five business days before refill needed, please use the MyChart Refill request or contact your pharmacy directly to request medication refills.   How to receive your test results: If you have signed up for MyChart, you will receive your test results and messages from me this way. Otherwise, you will get a phone call or letter. If you are expecting results and have not heard from my office within 2 weeks of your testing, please send a MyChart message or call my office.  Support for many chronic illnesses is available through [employer]: [employer] or [phone].  For questions on nights, weekends or holidays, call the operator at [phone], and ask for the doctor on call for Anesthesia Pain Management.

## 2023-07-31 ENCOUNTER — Encounter: Admit: 2023-07-31 | Discharge: 2023-07-31 | Payer: MEDICARE

## 2023-07-31 MED ORDER — CARBIDOPA-LEVODOPA 25-100 MG PO TAB
ORAL_TABLET | 2 refills | Status: AC
Start: 2023-07-31 — End: ?

## 2023-07-31 NOTE — Telephone Encounter
Received refill request for carbidopa-levodopa 25/100mg  tablets. Medication included in 05/05/23 LOV plan of care. Next OV scheduled for 11/22/23. Rx refilled electronically. Dr. Nedra Hai to [place].

## 2023-08-02 ENCOUNTER — Encounter: Admit: 2023-08-02 | Discharge: 2023-08-02 | Payer: MEDICARE

## 2023-08-02 ENCOUNTER — Ambulatory Visit: Admit: 2023-08-02 | Discharge: 2023-08-03 | Payer: MEDICARE

## 2023-08-02 DIAGNOSIS — Z9682 Presence of neurostimulator: Secondary | ICD-10-CM

## 2023-08-02 DIAGNOSIS — M792 Neuralgia and neuritis, unspecified: Secondary | ICD-10-CM

## 2023-08-02 DIAGNOSIS — M25561 Pain in right knee: Secondary | ICD-10-CM

## 2023-08-02 NOTE — Progress Notes
SPINE CENTER CLINIC NOTE       SUBJECTIVE:   Chronic right knee pain   Ongoing since 2020  Hx of knee replacement and revision  FUV s/p Nalu PNS Implant at right genicular nerve 10/30  Constant pain described as aching  Supplementing with tylenol  Aggravated with walking on uneven ground  Pain does not radiate at this time  Denies numbness in LEs  Ambulates with cane  Reports >65% relief with current PNS settings      PRIOR MEDICATIONS:   Effective  Acetaminophen (little)  Tizanidine     Ineffective  Gabapentin     Unable to tolerate  NSAID     Never  Lyrica  Ami/Nortriptyline  Cymbalta        PRIOR INTERVENTIONS:  L-spine surgery with hardware L2-3 L3-4 and then later L4-5 and L5-1 next (2 surgeries, most recent in 2008) Right TKA with revision  Effective  Bilateral SIJ (OSH, good benefit)  Left L5-S2 TFESI  right knee RFA - genicular  bilateral L4-L5 - transient        Ineffective  ESI x3 (OSH)  Lumbar RFA                Review of Systems    Current Outpatient Medications:     acetaminophen (TYLENOL) 325 mg tablet, Take two tablets by mouth every 4 hours as needed for Pain., Disp: 300 tablet, Rfl: 1    albuterol (VENTOLIN HFA, PROAIR HFA) 90 mcg/actuation inhaler, Inhale two puffs by mouth into the lungs four times daily as needed for Wheezing., Disp: , Rfl:     alpha lipoic acid 600 mg capsule, Take one capsule by mouth daily., Disp: , Rfl:     aspirin 81 mg chewable tablet, Chew one tablet by mouth at bedtime daily., Disp: , Rfl:     carbidopa-levodopa (SINEMET) 25-100 mg tablet, TAKE 2 TABLETS BY MOUTH IN THE MORNING AND IN THE AFTERNOON, THEN TAKE 1 TABLET IN THE EVENING, Disp: 450 tablet, Rfl: 2    cephalexin (KEFLEX) 500 mg capsule, Take one capsule by mouth four times daily., Disp: 28 capsule, Rfl: 0    cetirizine (ZYRTEC) 10 mg tablet, Take one tablet by mouth daily., Disp: , Rfl:     cholecalciferol (Vitamin D3) (VITAMIN D-3) 1,000 units tablet, Take one tablet by mouth twice daily.  , Disp: , Rfl: coenzyme Q10 200 mg capsule, Take one capsule by mouth daily., Disp: , Rfl:     cyanocobalamin (vitamin B-12) 500 mcg tablet, Take one tablet by mouth daily., Disp: , Rfl:     Docusate Sodium 250 mg cap, Take 250mg  in the am and 500mg  in the evening, Disp: , Rfl:     ECHINACEA PO, Take 1 capsule by mouth daily., Disp: , Rfl:     ezetimibe (ZETIA) 10 mg tablet, TAKE 1 TABLET BY MOUTH AT BEDTIME, Disp: 90 tablet, Rfl: 3    famotidine (PEPCID) 20 mg tablet, Take one tablet by mouth at bedtime daily. Indications: gastroesophageal reflux disease, Disp: 90 tablet, Rfl: 1    fesoterodine ER (TOVIAZ) 8 mg tablet, Take one tablet by mouth every 48 hours. Do not cut/ crush/ chew, Disp: 30 tablet, Rfl: 1    fexofenadine(+) (ALLEGRA) 180 mg tablet, Take one tablet by mouth daily., Disp: , Rfl:     fluticasone-umeclidin-vilanter (TRELEGY ELLIPTA) 100-62.5-25 mcg inhaler, Inhale 1 Dose by mouth into the lungs daily after dinner., Disp: , Rfl:     furosemide (LASIX) 40 mg  tablet, Take 2 tabs on Sunday-Tuesday- Thursday.  Take 3 tabs on Monday-Wednesday-Friday-Saturday (Patient taking differently: Take 2 tabs on Mon-Wed-Fri  Take 3 tabs on Sun-Tues-Thur-Sat), Disp: 226 tablet, Rfl: 3    gabapentin (NEURONTIN) 300 mg capsule, TAKE 1 CAPSULE BY MOUTH THREE TIMES DAILY (Patient taking differently: Take one capsule by mouth four times daily.), Disp: 270 capsule, Rfl: 3    glucosamine su 2KCl-chondroit 500-400 mg tab, Take 1 Tab by mouth twice daily., Disp: , Rfl:     guaiFENesin LA (MUCINEX) 600 mg tablet, Take one tablet by mouth twice daily., Disp: , Rfl:     ipratropium bromide (ATROVENT) 42 mcg (0.06 %) nasal spray, Apply two sprays to each nostril as directed twice daily as needed., Disp: , Rfl:     ketoconazole (NIZORAL) 2 % topical shampoo, Apply  topically to affected area every 7 days. Apply topically to affected area of damp skin, lather, leave on 5 minutes, and rinse., Disp: , Rfl:     losartan (COZAAR) 50 mg tablet, TAKE 1 TABLET BY MOUTH EVERY DAY, Disp: 90 tablet, Rfl: 0    methocarbamoL (ROBAXIN) 500 mg tablet, Take one tablet by mouth three times daily as needed for Spasms., Disp: 90 tablet, Rfl: 3    metoprolol succinate XL (TOPROL XL) 100 mg extended release tablet, TAKE 1 TABLET BY MOUTH EVERY DAY, Disp: 90 tablet, Rfl: 3    mucus clearing device (AEROBIKA OSCILLATING PEP SYSTM MISC), Use  as directed. Use with salt water and inhale twice daily, Disp: , Rfl:     OMEGA-3 FATTY ACIDS-FISH OIL PO, Take 1 capsule by mouth twice daily., Disp: , Rfl:     omeprazole DR (PRILOSEC) 40 mg capsule, TAKE 1 CAPSULE BY MOUTH TWICE DAILY, Disp: 180 capsule, Rfl: 0    other medication, one Dose. Mupirocin dissolved in warm water and Budesonide in NS  qhs  (sometimes increases to BID), Disp: , Rfl:     polyethylene glycol 3350 (MIRALAX) 17 g packet, Take one packet by mouth daily as needed., Disp: , Rfl:     potassium chloride SR (K-DUR) 20 mEq tablet, TAKE 2 TABLETS BY MOUTH ON MONDAY, WEDNESDAY AND FRIDAY AND 1 TABLET ALL OTHER DAYS (Patient taking differently: TAKE 1 TABLETS BY MOUTH ON MONDAY, WEDNESDAY AND FRIDAY AND 1 TABLET TWICE DAILY ON OTHER DAYS), Disp: 140 tablet, Rfl: 3    pyRIDostigmine bromide (MESTINON) 60 mg tablet, Take two tablets by mouth three times daily.  , Disp: , Rfl:     rosuvastatin (CRESTOR) 40 mg tablet, TAKE 1 TABLET BY MOUTH AT BEDTIME, Disp: 90 tablet, Rfl: 2    spironolactone (ALDACTONE) 25 mg tablet, TAKE 1 TABLET BY MOUTH EVERY DAY WITH FOOD, Disp: 90 tablet, Rfl: 3    tamsulosin (FLOMAX) 0.4 mg capsule, Take one capsule by mouth daily., Disp: , Rfl:     testosterone cypionate 200 mg/mL kit, Inject 1 mL into the muscle every 14 days., Disp: , Rfl:     traMADoL (ULTRAM) 50 mg tablet, Take one tablet by mouth every 8 hours as needed for Pain., Disp: 25 tablet, Rfl: 0    traZODone (DESYREL) 100 mg tablet, Take one tablet by mouth at bedtime daily., Disp: , Rfl:     Vacuum Erection Device System kit, Use as directed for sexual activity., Disp: 1 kit, Rfl: 11    warfarin (COUMADIN) 3 mg tablet, Take one tablet by mouth at bedtime daily., Disp: , Rfl:   Allergies   Allergen  Reactions    Clarithromycin RASH     Redden and burns both ends of GI tract    Ketoconazole SEE COMMENTS     Had bloodshot eyes after use, and sore.  Happens if he gets drug in his eyes.  Tolerates shampoo fine.    Lisinopril COUGH    Oxycodone UNKNOWN     Nightmares  Tolerates Tramadol     Physical Exam  Vitals:    08/02/23 1541   BP: (P) 117/77   Weight: 105.7 kg (233 lb)   Height: 177.8 cm (5' 10)           Body mass index is 33.43 kg/m?Marland Kitchen    Physical Exam:  General: The patient is a well-developed, well nourished 77 y.o. male in no acute distress.   HEENT: Head is normocephalic and atraumatic. Pupils are equal and reactive to light bilaterally.   Cardiac: Based on palpation, pulse appears to be regular rate and rhythm.   Pulmonary: The patient has unlabored respirations and bilateral symmetric chest excursion.   Abdomen: Soft, nontender, and nondistended. There is no rebound or guarding.   Extremities: No clubbing, cyanosis, or edema.   Skin: Warm and dry to touch. Right knee surgical incisions c/d/I.  MS:  Moves extremities spontaneously  Neurologic:   The patient is alert and oriented times 3.   Cranial nerves II through XII intact  Gait antalgic with use of cane           IMPRESSION:  1. Chronic pain of right knee    2. Neuropathic pain    3. Peripheral nerve neurostimulator device in situ          PLAN:    Right knee surgical staples removed.  Skin redressed and cleansed with chloraprep, steristrips, gauze, tegaderm.  Skin c/d/I.  Instructed to keep dressing on x 1 week, then can remove.  Nalu rep available for PNS optimization.  Currently reporting >65% pain relief with PNS.  FUV 3 weeks for wound check.

## 2023-08-10 ENCOUNTER — Encounter: Admit: 2023-08-10 | Discharge: 2023-08-10 | Payer: MEDICARE

## 2023-08-10 MED ORDER — ROSUVASTATIN 40 MG PO TAB
ORAL_TABLET | ORAL | 3 refills | 90.00000 days | Status: AC
Start: 2023-08-10 — End: ?

## 2023-08-20 ENCOUNTER — Encounter: Admit: 2023-08-20 | Discharge: 2023-08-20 | Payer: MEDICARE

## 2023-08-24 ENCOUNTER — Encounter: Admit: 2023-08-24 | Discharge: 2023-08-24 | Payer: MEDICARE

## 2023-08-24 ENCOUNTER — Ambulatory Visit: Admit: 2023-08-24 | Discharge: 2023-08-25 | Payer: MEDICARE

## 2023-08-24 DIAGNOSIS — K219 Gastro-esophageal reflux disease without esophagitis: Secondary | ICD-10-CM

## 2023-08-24 DIAGNOSIS — K5909 Other constipation: Secondary | ICD-10-CM

## 2023-08-24 MED ORDER — LOSARTAN 50 MG PO TAB
50 mg | ORAL_TABLET | Freq: Every day | ORAL | 0 refills | 90.00000 days | Status: AC
Start: 2023-08-24 — End: ?

## 2023-08-24 NOTE — Progress Notes
Telehealth Visit Note    Date of Service: 08/24/2023    Subjective:           Johnny [name] is a 77 y.o. male patient of Dr. Jean Rosenthal with past medical history of gastroesophageal reflux disease (GERD), atrial fibrillation (AFib), and chronic obstructive pulmonary disease (COPD) returns to clinic for continued management of GERD and constipation.     History of Present Illness  Upon establishing care Johnny [name] reported experiencing abdominal discomfort, nausea, vomiting, bloating, and changes in bowel habits. The discomfort is described as being diffuse and more pronounced in the upper abdomen, often occurring throughout the day but also can occur at night. The patient also reports episodes of heartburn and regurgitation, with one instance of a particularly acidic belch.     Previously managing the reflux symptoms with Rolaids and previously Tums, along with pantoprazole 40mg  BID after breakfast and dinner and famotidine after dinner.  He changed the timing of medication and transition to omeprazole with improvement without resolution of GERD symptoms.  He reports increased gas production with Tums and has switched to Rolaids with noted improvement.      Previously on antibiotics to include Z-Pak and doxycycline but he does not remember if he had a significant change in his GI symptoms with these.      He battles constipation and takes 4 grams of fiber from gummies daily, Colace x 3/day, and consumes seven to eight prunes nightly. Has tried citrucel and made his symptoms worse with bloating and gas.  Will also use miralax rarely as last resort as it causes cramps.  On subtherapeutic regiment he has reported urgency, fecal leakage of both liquid/soft/formed stools with smearing even with the passage of gas.  He much improvement in this after pelvic floor physical therapy and [place] with regard to fecal incontinence. He did notice that these episodes could be associated with greasy/oily food and spicy food which she is trying to avoid.      The patient has a history of hemorrhoids and has undergone previous treatments with a local surgeon but still feels discomfort.   Currently, the patient is using a 2.5% cortisone cream for the hemorrhoids, which has provided some relief. However, the patient reports that the hemorrhoids can become aggravated, leading to discomfort.  He has been referred to Dr. Daiva Nakayama.      The patient has a history of open-heart surgery in 2006 and has been experiencing more frequent episodes of AFib recently.      Prior endoscopic workup  EGD at [facility] in 2019 with noted hiatal hernia per his report, uncertain about Barrett's diagnosis, remembers there being some chronic changes but did not tell him that he needed any routine follow up.    Colonoscopy approximately ~2022 with a recommendation for a follow-up in 5 years. The patient has not reported any significant changes in bowel habits since these procedures.  He underwent 09/30/2022 EGD 1 mm island of salmon-colored mucosa proximal to the distal esophagus suspicious for Barrett's, 1 cm hiatal hernia, multiple small gastric fundic polyps, normal duodenum through third portion.    09/2022 Flexible sigmoidoscopy at that time revealed extensive amounts of stool precluding visualization.  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  Hypertrophied anal papilla.                      Objective:  acetaminophen (TYLENOL) 325 mg tablet Take two tablets by mouth every 4 hours as needed for Pain.    albuterol (VENTOLIN HFA, PROAIR HFA) 90 mcg/actuation inhaler Inhale two puffs by mouth into the lungs four times daily as needed for Wheezing.    alpha lipoic acid 600 mg capsule Take one capsule by mouth daily.    aspirin 81 mg chewable tablet Chew one tablet by mouth at bedtime daily.    carbidopa-levodopa (SINEMET) 25-100 mg tablet TAKE 2 TABLETS BY MOUTH IN THE MORNING AND IN THE AFTERNOON, THEN TAKE 1 TABLET IN THE EVENING    cephalexin (KEFLEX) 500 mg capsule Take one capsule by mouth four times daily.    cetirizine (ZYRTEC) 10 mg tablet Take one tablet by mouth daily.    cholecalciferol (Vitamin D3) (VITAMIN D-3) 1,000 units tablet Take one tablet by mouth twice daily.      coenzyme Q10 200 mg capsule Take one capsule by mouth daily.    cyanocobalamin (vitamin B-12) 500 mcg tablet Take one tablet by mouth daily.    Docusate Sodium 250 mg cap Take 250mg  in the am and 500mg  in the evening    ECHINACEA PO Take 1 capsule by mouth daily.    ezetimibe (ZETIA) 10 mg tablet TAKE 1 TABLET BY MOUTH AT BEDTIME    famotidine (PEPCID) 40 mg tablet Take one tablet by mouth daily.    fesoterodine ER (TOVIAZ) 8 mg tablet Take one tablet by mouth every 48 hours. Do not cut/ crush/ chew    fexofenadine(+) (ALLEGRA) 180 mg tablet Take one tablet by mouth daily.    fluticasone-umeclidin-vilanter (TRELEGY ELLIPTA) 100-62.5-25 mcg inhaler Inhale 1 Dose by mouth into the lungs daily after dinner.    furosemide (LASIX) 40 mg tablet Take 2 tabs on Sunday-Tuesday- Thursday.  Take 3 tabs on Monday-Wednesday-Friday-Saturday (Patient taking differently: Take 2 tabs on Mon-Wed-Fri  Take 3 tabs on Sun-Tues-Thur-Sat)    gabapentin (NEURONTIN) 300 mg capsule TAKE 1 CAPSULE BY MOUTH THREE TIMES DAILY (Patient taking differently: Take one capsule by mouth four times daily.)    glucosamine su 2KCl-chondroit 500-400 mg tab Take 1 Tab by mouth twice daily.    guaiFENesin LA (MUCINEX) 600 mg tablet Take one tablet by mouth twice daily.    ipratropium bromide (ATROVENT) 42 mcg (0.06 %) nasal spray Apply two sprays to each nostril as directed twice daily as needed.    ketoconazole (NIZORAL) 2 % topical shampoo Apply  topically to affected area every 7 days. Apply topically to affected area of damp skin, lather, leave on 5 minutes, and rinse.    losartan (COZAAR) 50 mg tablet TAKE 1 TABLET BY MOUTH EVERY DAY methocarbamoL (ROBAXIN) 500 mg tablet Take one tablet by mouth three times daily as needed for Spasms.    metoprolol succinate XL (TOPROL XL) 100 mg extended release tablet TAKE 1 TABLET BY MOUTH EVERY DAY    mucus clearing device (AEROBIKA OSCILLATING PEP SYSTM MISC) Use  as directed. Use with salt water and inhale twice daily    OMEGA-3 FATTY ACIDS-FISH OIL PO Take 1 capsule by mouth twice daily.    omeprazole DR (PRILOSEC) 40 mg capsule TAKE 1 CAPSULE BY MOUTH TWICE DAILY    other medication one Dose. Mupirocin dissolved in warm water and Budesonide in NS  qhs  (sometimes increases to BID)    polyethylene glycol 3350 (MIRALAX) 17 g packet Take one packet by mouth daily as needed.    potassium chloride SR (K-DUR) 20 mEq  tablet TAKE 2 TABLETS BY MOUTH ON MONDAY, WEDNESDAY AND FRIDAY AND 1 TABLET ALL OTHER DAYS (Patient taking differently: TAKE 1 TABLETS BY MOUTH ON MONDAY, WEDNESDAY AND FRIDAY AND 1 TABLET TWICE DAILY ON OTHER DAYS)    pyRIDostigmine bromide (MESTINON) 60 mg tablet Take two tablets by mouth three times daily.      rosuvastatin (CRESTOR) 40 mg tablet TAKE 1 TABLET BY MOUTH AT BEDTIME    spironolactone (ALDACTONE) 25 mg tablet TAKE 1 TABLET BY MOUTH EVERY DAY WITH FOOD    tamsulosin (FLOMAX) 0.4 mg capsule Take one capsule by mouth daily.    testosterone cypionate 200 mg/mL kit Inject 1 mL into the muscle every 14 days.    traMADoL (ULTRAM) 50 mg tablet Take one tablet by mouth every 8 hours as needed for Pain.    traZODone (DESYREL) 100 mg tablet Take one tablet by mouth at bedtime daily.    Vacuum Erection Device System kit Use as directed for sexual activity.    warfarin (COUMADIN) 3 mg tablet Take one tablet by mouth at bedtime daily.          Telehealth Patient Reported Vitals       Row Name 08/24/23 1113                Patient Position Sitting        BP Source Arm, Right Upper                      Computed Telehealth Body Mass Index unavailable. One or more values for this score either were not found within the given timeframe or did not fit some other criterion.    Physical Exam         Assessment and Plan:  Johnny [name] is a 77 y.o. male patient of Dr. Jean Rosenthal with past medical history of gastroesophageal reflux disease (GERD), atrial fibrillation (AFib), and chronic obstructive pulmonary disease (COPD) returns to clinic for continued management of GERD and constipation.     Abdominal burning and gas significantly improved with PPI therapy and escalation but he continues to have breakthrough symptoms.  He is concerned that his mucus is a sign of subtherapeutic leak controlled GERD.  It was iterated that we can only determine if there is persistent pathologic acid despite maximized PPI therapy with ambulatory pH testing.  Alternatively, we discussed starting Vonquezna which would reduce pill burden and provide up to 7 times more coverage than traditional PPI therapy independent of meals and monitor mucus.  He would like to discuss with his team at [facility] and revisit at follow-up appointment.     Likely fecal incontinence has resolved with pelvic floor physical therapy.  Incontinence is worse with greasy/oily food and spicy food.  Can consider workup for pancreatic insufficiency if patient is dissatisfied with symptoms in the future.     After discussion with the patient it was decided to proceed with the following:    - Add 400-800 mg of over-the-counter magnesium citrate twice daily onto fiber, Colace, and prunes for a bowel regiment.  - Continue to follow with Dr. Daiva Nakayama for treatment of hemorrhoids.  - Continue with pelvic floor physical therapy as needed.  - Repeat colonoscopy for surveillance pending comorbidities 2027.    - Continue Pepcid/famotidine 40 mg nightly.  - Okay to use over-the-counter Rolaids as needed.  - Continue 40 mg omeprazole 30 minutes - 1 hour prior to first and last meal the day.  -  Obtain pathology from [facility] from EGD 09/30/2022.  Suspicious for Barrett's 2019 and 2024.  Unclear if this area was biopsied and I do not see pathology which could help US guide surveillance.  - Patient will review the following options with his team at [facility]: Maximize acid reduction therapy (Vonquezna) and monitor mucus and/or pH impedance testing on acid therapy to determine presence of pathologic acid/symptom correlation.    - Return to telemedicine clinic with nurse practitioner Gerhard Munch in 3 months; 1 hour-long.     All questions were answered.  Thank you for allowing me to participate in care of this patient.  Please call GI clinic if there are any questions/concerns.  Total Time Today was 45 minutes in the following activities: Preparing to see the patient, Obtaining and/or reviewing separately obtained history, Performing a medically appropriate examination and/or evaluation, Ordering medications, tests, or procedures, and Documenting clinical information in the electronic or other health record     Mikeal Hawthorne, APRN-NP                             45 minutes spent on this patient's encounter with counseling and coordination of care taking >50% of the visit.

## 2023-09-07 ENCOUNTER — Encounter: Admit: 2023-09-07 | Discharge: 2023-09-07 | Payer: MEDICARE

## 2023-09-11 ENCOUNTER — Encounter: Admit: 2023-09-11 | Discharge: 2023-09-11 | Payer: MEDICARE

## 2023-09-27 ENCOUNTER — Encounter: Admit: 2023-09-27 | Discharge: 2023-09-27 | Payer: MEDICARE

## 2023-10-07 ENCOUNTER — Ambulatory Visit: Admit: 2023-10-07 | Discharge: 2023-10-08 | Payer: MEDICARE

## 2023-10-10 ENCOUNTER — Encounter: Admit: 2023-10-10 | Discharge: 2023-10-10 | Payer: MEDICARE

## 2023-10-16 ENCOUNTER — Encounter: Admit: 2023-10-16 | Discharge: 2023-10-16 | Payer: MEDICARE

## 2023-10-25 ENCOUNTER — Encounter: Admit: 2023-10-25 | Discharge: 2023-10-25 | Payer: MEDICARE

## 2023-11-01 ENCOUNTER — Encounter: Admit: 2023-11-01 | Discharge: 2023-11-01 | Payer: MEDICARE

## 2023-11-03 ENCOUNTER — Encounter: Admit: 2023-11-03 | Discharge: 2023-11-03 | Payer: MEDICARE

## 2023-11-03 MED ORDER — POTASSIUM CHLORIDE 20 MEQ PO TBTQ
ORAL_TABLET | 2 refills | 30.00000 days | Status: AC
Start: 2023-11-03 — End: ?

## 2023-11-03 MED ORDER — METOPROLOL SUCCINATE 100 MG PO TB24
100 mg | ORAL_TABLET | Freq: Every day | ORAL | 3 refills | 90.00000 days | Status: AC
Start: 2023-11-03 — End: ?

## 2023-11-03 MED ORDER — SPIRONOLACTONE 25 MG PO TAB
25 mg | ORAL_TABLET | Freq: Every day | ORAL | 3 refills | 90.00000 days | Status: AC
Start: 2023-11-03 — End: ?

## 2023-11-03 MED ORDER — LOSARTAN 50 MG PO TAB
50 mg | ORAL_TABLET | Freq: Every day | ORAL | 3 refills | 90.00000 days | Status: AC
Start: 2023-11-03 — End: ?

## 2023-11-08 ENCOUNTER — Encounter: Admit: 2023-11-08 | Discharge: 2023-11-08 | Payer: MEDICARE

## 2023-11-08 ENCOUNTER — Ambulatory Visit: Admit: 2023-11-08 | Discharge: 2023-11-09 | Payer: MEDICARE

## 2023-11-09 IMAGING — CR SHOULDCMRT
4 series · 4 of 4 positions shown · non-contrast
Comparison: none

[shoulder external]
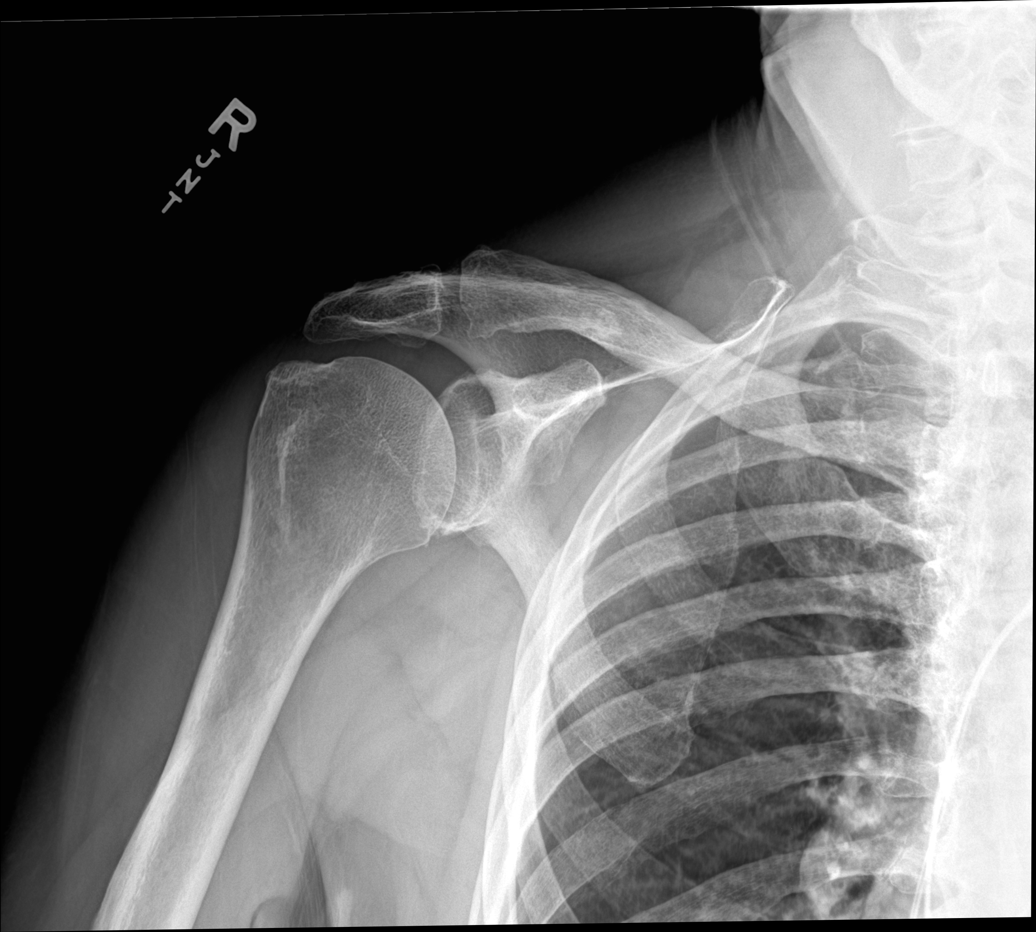

[shoulder internal]
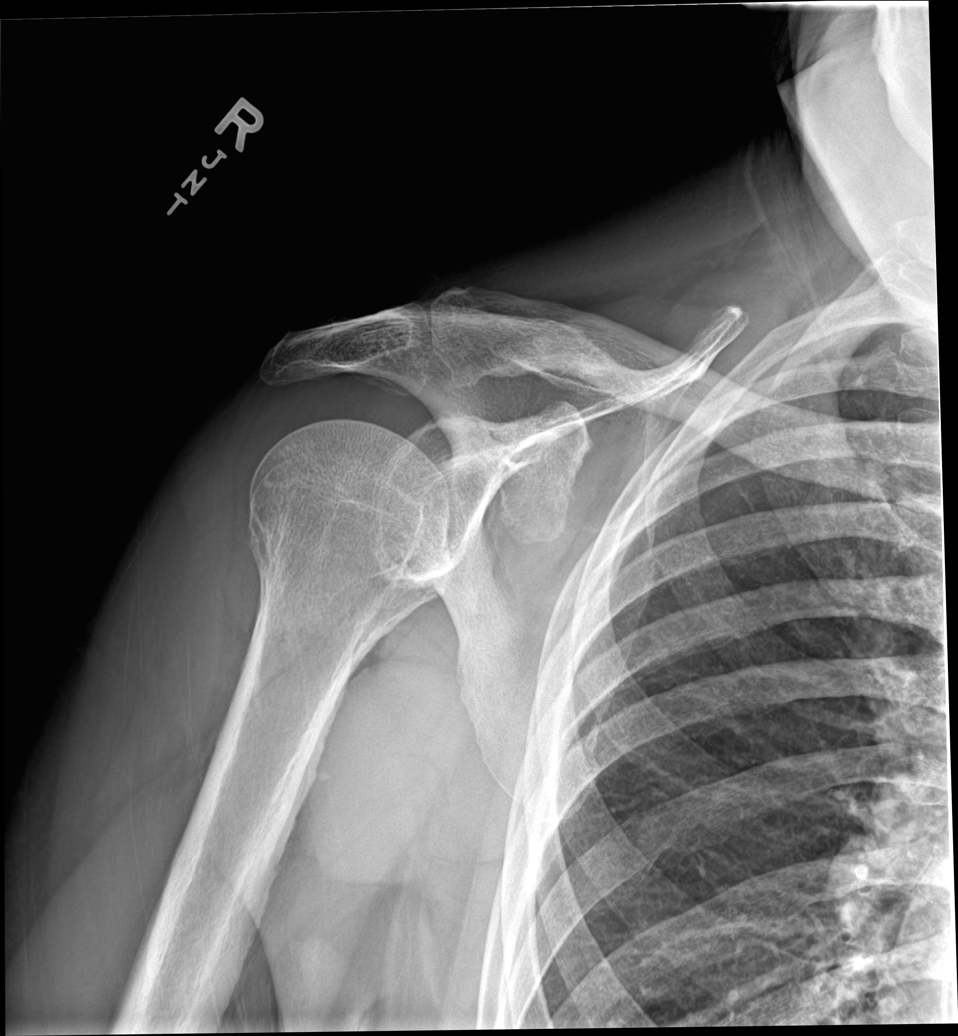

[shoulder y-view]
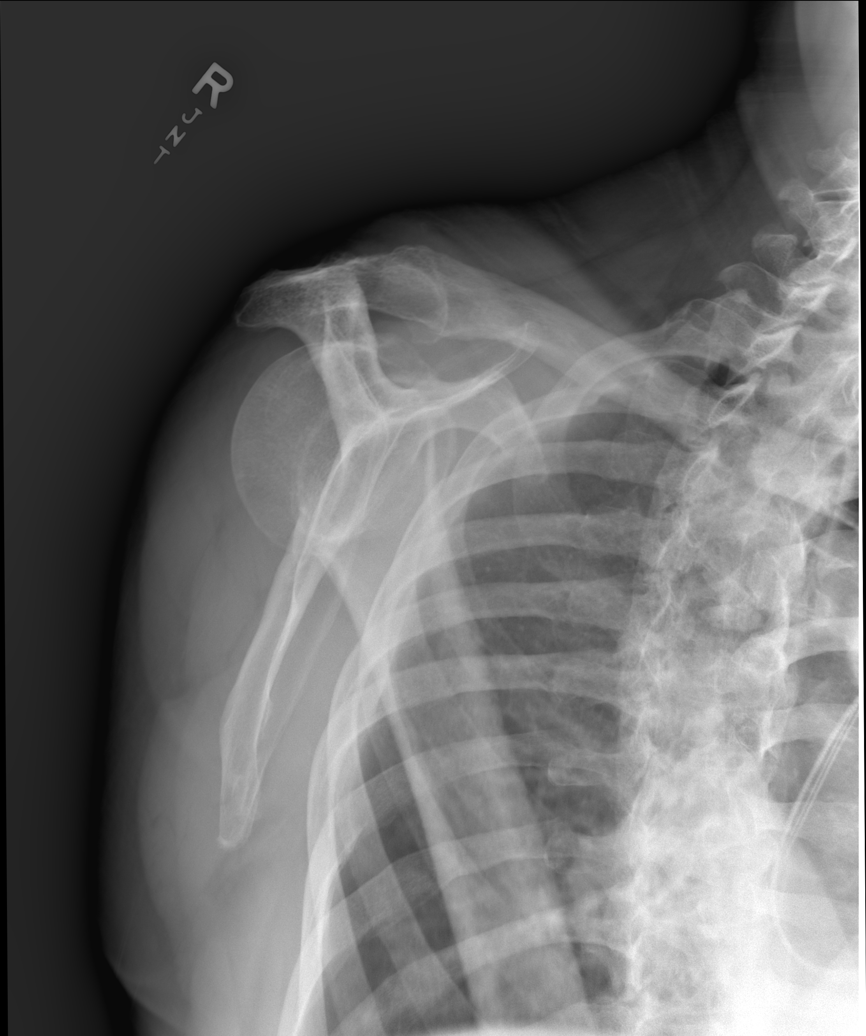

[shoulder axillary]
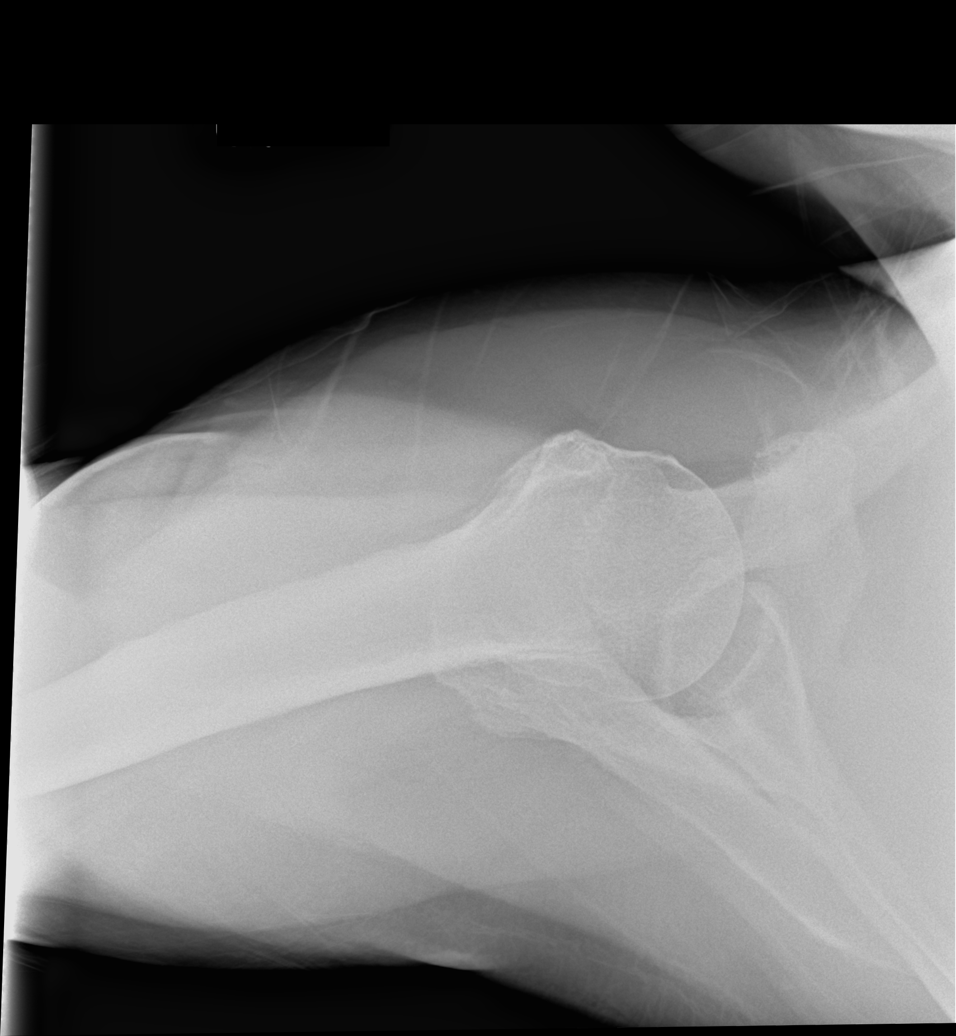

[4 of 4 positions shown; findings below may reference images not displayed]

Addor, [place]  * Location:

DIAGNOSTIC STUDIES

EXAM

RIGHT SHOULDER

INDICATION

R shoulder pain
Pt c/o right shoulder pain x a couple weeks, pain caused when he extended arm. JT

TECHNIQUE

Internal rotation, external rotation, axillary, and Y views of the  right shoulder

COMPARISONS

None

FINDINGS

No dislocation or acute bony injury/fracture is identified.  Very mild degenerative changes are
present about the glenohumeral and acromioclavicular joints. The bony structures are otherwise
unremarkable. Adjacent soft tissue structures are unremarkable.

IMPRESSION

No acute bony injury of the  right shoulder.

Tech Notes:

Pt c/o right shoulder pain x a couple weeks, pain caused when he extended arm. JT

## 2023-11-10 ENCOUNTER — Encounter: Admit: 2023-11-10 | Discharge: 2023-11-10 | Payer: MEDICARE

## 2023-11-15 ENCOUNTER — Encounter: Admit: 2023-11-15 | Discharge: 2023-11-15 | Payer: MEDICARE

## 2023-11-15 ENCOUNTER — Ambulatory Visit: Admit: 2023-11-15 | Discharge: 2023-11-15 | Payer: MEDICARE

## 2023-11-15 NOTE — Telephone Encounter
 Pt's wife, Leta Jungling, LVM on nurse line with some questions and requesting c/b.    Returned call to [employer]. She originally asked if upcoming telehealth could be a telephone call, but we discussed they will be able to complete the video via patient's phone. She also said they received in the mail request for EGD for patient; confirmed that is okay to order for patient. Offered mailing instructions but Leta Jungling said that is fine to send via MyChart. EGD orders placed as ordered by Mikeal Hawthorne and message sent with instructions. No further questions at this time.

## 2023-11-16 ENCOUNTER — Ambulatory Visit: Admit: 2023-11-16 | Discharge: 2023-11-17 | Payer: MEDICARE

## 2023-11-16 ENCOUNTER — Encounter: Admit: 2023-11-16 | Discharge: 2023-11-16 | Payer: MEDICARE

## 2023-11-17 ENCOUNTER — Encounter: Admit: 2023-11-17 | Discharge: 2023-11-17 | Payer: MEDICARE

## 2023-11-20 ENCOUNTER — Encounter: Admit: 2023-11-20 | Discharge: 2023-11-20 | Payer: MEDICARE

## 2023-11-20 ENCOUNTER — Ambulatory Visit: Admit: 2023-11-20 | Discharge: 2023-11-21 | Payer: MEDICARE

## 2023-11-20 DIAGNOSIS — K649 Unspecified hemorrhoids: Secondary | ICD-10-CM

## 2023-11-20 DIAGNOSIS — K219 Gastro-esophageal reflux disease without esophagitis: Secondary | ICD-10-CM

## 2023-11-20 NOTE — Progress Notes
 Telehealth Visit Note    Date of Service: 11/20/2023    Subjective:           Johnny [name] is a 78 y.o. male patient of Dr. Jean Rosenthal with past medical history of gastroesophageal reflux disease (GERD), atrial fibrillation (AFib), and chronic obstructive pulmonary disease (COPD) returns to clinic for continued management of GERD and constipation.     History of Present Illness  Upon establishing care Johnny [name] reported experiencing abdominal discomfort, nausea, vomiting, bloating, and changes in bowel habits. The discomfort is described as being diffuse and more pronounced in the upper abdomen, often occurring throughout the day but also can occur at night. The patient also reports episodes of heartburn and regurgitation, with one instance of a particularly acidic belch.     Previously managing the reflux symptoms with Rolaids and previously Tums, along with pantoprazole 40mg  BID after breakfast and dinner and famotidine after dinner.  He changed the timing of medication and transition to omeprazole with improvement without resolution of GERD symptoms.  He reports increased gas production with Tums and has switched to Rolaids with noted improvement.       Previously on antibiotics to include Z-Pak and doxycycline but he does not remember if he had a significant change in his GI symptoms with these.      He battles constipation and takes 4 grams of fiber from gummies daily, Colace x 3/day, and consumes seven to eight prunes nightly. Has tried citrucel and made his symptoms worse with bloating and gas.  Will also use miralax rarely as last resort as it causes cramps.  On subtherapeutic regiment he has reported urgency, fecal leakage of both liquid/soft/formed stools with smearing even with the passage of gas.  He much improvement in this after pelvic floor physical therapy and [place] with regard to fecal incontinence. He did notice that these episodes could be associated with greasy/oily food and spicy food which she is trying to avoid.      The patient has a history of hemorrhoids and has undergone previous treatments with a local surgeon but still feels discomfort.   Currently, the patient is using a 2.5% cortisone cream for the hemorrhoids, which has provided some relief. However, the patient reports that the hemorrhoids can become aggravated, leading to discomfort.  Referred to Dr. Daiva Nakayama.      The patient has a history of open-heart surgery in 2006 and has been experiencing more frequent episodes of AFib recently.      Prior endoscopic workup  EGD at [facility] in 2019 with noted hiatal hernia per his report, uncertain about Barrett's diagnosis, remembers there being some chronic changes but did not tell him that he needed any routine follow up.    Colonoscopy approximately ~2022 with a recommendation for a follow-up in 5 years. The patient has not reported any significant changes in bowel habits since these procedures.  He underwent 09/30/2022 EGD 1 mm island of salmon-colored mucosa proximal to the distal esophagus suspicious for Barrett's, 1 cm hiatal hernia, multiple small gastric fundic polyps, normal duodenum through third portion.    09/2022 Flexible sigmoidoscopy at that time revealed extensive amounts of stool precluding visualization.  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  Hypertrophied anal papilla.                  Objective:          acetaminophen (  TYLENOL) 325 mg tablet Take two tablets by mouth every 4 hours as needed for Pain.    albuterol (VENTOLIN HFA, PROAIR HFA) 90 mcg/actuation inhaler Inhale two puffs by mouth into the lungs four times daily as needed for Wheezing.    alpha lipoic acid 600 mg capsule Take one capsule by mouth daily.    aspirin 81 mg chewable tablet Chew one tablet by mouth at bedtime daily.    carbidopa-levodopa (SINEMET) 25-100 mg tablet TAKE 2 TABLETS BY MOUTH IN THE MORNING AND IN THE AFTERNOON, THEN TAKE 1 TABLET IN THE EVENING    cephalexin (KEFLEX) 500 mg capsule Take one capsule by mouth four times daily.    cetirizine (ZYRTEC) 10 mg tablet Take one tablet by mouth daily.    cholecalciferol (Vitamin D3) (VITAMIN D-3) 1,000 units tablet Take one tablet by mouth twice daily.      coenzyme Q10 200 mg capsule Take one capsule by mouth daily.    cyanocobalamin (vitamin B-12) 500 mcg tablet Take one tablet by mouth daily.    Docusate Sodium 250 mg cap Take 250mg  in the am and 500mg  in the evening    ECHINACEA PO Take 1 capsule by mouth daily.    ezetimibe (ZETIA) 10 mg tablet TAKE 1 TABLET BY MOUTH AT BEDTIME    famotidine (PEPCID) 40 mg tablet Take one tablet by mouth daily.    fexofenadine(+) (ALLEGRA) 180 mg tablet Take one tablet by mouth daily.    fluticasone-umeclidin-vilanter (TRELEGY ELLIPTA) 100-62.5-25 mcg inhaler Inhale 1 Dose by mouth into the lungs daily after dinner.    furosemide (LASIX) 40 mg tablet Take 2 tabs on Sunday-Tuesday- Thursday.  Take 3 tabs on Monday-Wednesday-Friday-Saturday (Patient taking differently: Take 2 tabs on Mon-Wed-Fri  Take 3 tabs on Sun-Tues-Thur-Sat)    gabapentin (NEURONTIN) 300 mg capsule TAKE 1 CAPSULE BY MOUTH THREE TIMES DAILY (Patient taking differently: Take one capsule by mouth four times daily.)    glucosamine su 2KCl-chondroit 500-400 mg tab Take 1 Tab by mouth twice daily.    guaiFENesin LA (MUCINEX) 600 mg tablet Take one tablet by mouth twice daily.    ipratropium bromide (ATROVENT) 42 mcg (0.06 %) nasal spray Apply two sprays to each nostril as directed twice daily as needed.    ketoconazole (NIZORAL) 2 % topical shampoo Apply  topically to affected area every 7 days. Apply topically to affected area of damp skin, lather, leave on 5 minutes, and rinse.    losartan (COZAAR) 50 mg tablet TAKE 1 TABLET BY MOUTH EVERY DAY    methocarbamoL (ROBAXIN) 500 mg tablet Take one tablet by mouth three times daily as needed for Spasms.    metoprolol succinate XL (TOPROL XL) 100 mg extended release tablet TAKE 1 TABLET BY MOUTH EVERY DAY    mirabegron (MYRBETRIQ) 50 mg ER tablet Take one tablet by mouth daily.    mucus clearing device (AEROBIKA OSCILLATING PEP SYSTM MISC) Use  as directed. Use with salt water and inhale twice daily    OMEGA-3 FATTY ACIDS-FISH OIL PO Take 1 capsule by mouth twice daily.    omeprazole DR (PRILOSEC) 40 mg capsule TAKE 1 CAPSULE BY MOUTH TWICE DAILY    other medication one Dose. Mupirocin dissolved in warm water and Budesonide in NS  qhs  (sometimes increases to BID)    polyethylene glycol 3350 (MIRALAX) 17 g packet Take one packet by mouth daily as needed.    potassium chloride SR (K-DUR) 20 mEq tablet TAKE 2 TABLETS BY  MOUTH ON MONDAY, WEDNESDAY AND FRIDAY AND 1 TABLET ALL OTHER DAYS    pyRIDostigmine bromide (MESTINON) 60 mg tablet Take two tablets by mouth three times daily.      rosuvastatin (CRESTOR) 40 mg tablet TAKE 1 TABLET BY MOUTH AT BEDTIME    spironolactone (ALDACTONE) 25 mg tablet TAKE 1 TABLET BY MOUTH EVERY DAY WITH FOOD    tamsulosin (FLOMAX) 0.4 mg capsule Take one capsule by mouth daily.    testosterone cypionate 200 mg/mL kit Inject 1 mL into the muscle every 14 days.    traMADoL (ULTRAM) 50 mg tablet Take one tablet by mouth every 8 hours as needed for Pain.    traZODone (DESYREL) 100 mg tablet Take one tablet by mouth at bedtime daily.    Vacuum Erection Device System kit Use as directed for sexual activity.    warfarin (COUMADIN) 3 mg tablet Take one tablet by mouth at bedtime daily.          Telehealth Patient Reported Vitals       Row Name 11/20/23 1431                BP: --  Pt does not have BP reading at this visit.        Weight: 103.4 kg (228 lb)        Height: 180.3 cm (5' 10.98)        Pain Score: SIX        Pain Location: KNEE                      Telehealth Body Mass Index: [phone] at 11/20/2023  2:36 PM    Physical Exam  Vitals reviewed.   HENT:      Head: Normocephalic and atraumatic. Nose: Nose normal.      Mouth/Throat:      Mouth: Mucous membranes are moist.   Eyes:      Extraocular Movements: Extraocular movements intact.      Pupils: Pupils are equal, round, and reactive to light.   Pulmonary:      Effort: Pulmonary effort is normal.   Musculoskeletal:         General: Normal range of motion.      Cervical back: Normal range of motion.   Skin:     General: Skin is dry.   Neurological:      General: No focal deficit present.      Mental Status: He is alert and oriented to person, place, and time.   Psychiatric:         Mood and Affect: Mood normal.         Behavior: Behavior normal.         Thought Content: Thought content normal.         Judgment: Judgment normal.              Assessment and Plan:  Johnny [name] is a 78 y.o. male patient of Dr. Jean Rosenthal with past medical history of gastroesophageal reflux disease (GERD), atrial fibrillation (AFib), and chronic obstructive pulmonary disease (COPD) returns to clinic for continued management of GERD and constipation.     Abdominal burning and gas significantly improved with PPI therapy and escalation but he continues to have breakthrough symptoms.  He is concerned that his mucus is a sign of subtherapeutic controlled GERD.  It was iterated that we can only determine if there is persistent pathologic acid despite maximized PPI therapy with ambulatory pH testing.  Alternatively, we discussed starting Vonquezna which would reduce pill burden and provide up to 7 times more coverage than traditional PPI therapy independent of meals and monitor mucus.       Likely fecal incontinence has resolved with pelvic floor physical therapy.  Incontinence is worse with greasy/oily food and spicy food and possibly there was an overflow component.  Can consider workup for pancreatic insufficiency if patient is dissatisfied with symptoms in the future.     After discussion with the patient it was decided to proceed with the following:     - Consider 400-800 mg of over-the-counter magnesium citrate, Colace, and prunes for a bowel regiment for as needed.  - Continue daily fiber  - Follow with Dr. Daiva Nakayama for treatment of hemorrhoids as needed.  - Continue with pelvic floor physical therapy as needed.  - Repeat colonoscopy for surveillance pending comorbidities 2027.     - Continue Pepcid/famotidine 40 mg nightly.  - Okay to use over-the-counter Rolaids as needed.  - Continue 40 mg omeprazole 30 minutes - 1 hour prior to first and last meal the day.  - pH impedence testing on above/current GERD therapy. If pathologic acid can consider Vonquezna.  - Continue with plan for EGD w/ Bx to r/o and clarify baretts dx. Please also add on placement of pH probe for pH impedence testing on above/current GERD therapy.     - Return to telemedicine clinic with APP in 3 months; 1 hour-long.     All questions were answered.  Thank you for allowing me to participate in care of this patient.  Please call GI clinic if there are any questions/concerns.  Total Time Today was 45 minutes in the following activities: Preparing to see the patient, Obtaining and/or reviewing separately obtained history, Performing a medically appropriate examination and/or evaluation, Ordering medications, tests, or procedures, and Documenting clinical information in the electronic or other health record     Mikeal Hawthorne, APRN-NP                           45 minutes spent on this patient's encounter with counseling and coordination of care taking >50% of the visit.

## 2023-11-22 ENCOUNTER — Encounter: Admit: 2023-11-22 | Discharge: 2023-11-22 | Payer: MEDICARE

## 2023-11-22 ENCOUNTER — Ambulatory Visit: Admit: 2023-11-22 | Discharge: 2023-11-22 | Payer: MEDICARE

## 2023-11-27 ENCOUNTER — Encounter: Admit: 2023-11-27 | Discharge: 2023-11-27 | Payer: MEDICARE

## 2023-11-27 ENCOUNTER — Ambulatory Visit: Admit: 2023-11-27 | Discharge: 2023-11-27 | Payer: MEDICARE

## 2023-11-30 ENCOUNTER — Encounter: Admit: 2023-11-30 | Discharge: 2023-11-30 | Payer: MEDICARE

## 2023-11-30 DIAGNOSIS — J42 Unspecified chronic bronchitis: Secondary | ICD-10-CM

## 2023-12-01 ENCOUNTER — Encounter: Admit: 2023-12-01 | Discharge: 2023-12-01 | Payer: MEDICARE

## 2023-12-05 ENCOUNTER — Encounter: Admit: 2023-12-05 | Discharge: 2023-12-05 | Payer: MEDICARE

## 2023-12-07 ENCOUNTER — Ambulatory Visit: Admit: 2023-12-07 | Discharge: 2023-12-07 | Payer: MEDICARE

## 2023-12-07 ENCOUNTER — Encounter: Admit: 2023-12-07 | Discharge: 2023-12-07 | Payer: MEDICARE

## 2023-12-07 MED ORDER — LIDOCAINE (PF) 20 MG/ML (2 %) IJ SOLN
INTRAVENOUS | 0 refills | Status: DC
Start: 2023-12-07 — End: 2023-12-07

## 2023-12-07 MED ORDER — LACTATED RINGERS IV SOLP
INTRAVENOUS | 0 refills | Status: DC
Start: 2023-12-07 — End: 2023-12-07

## 2023-12-07 MED ORDER — PROPOFOL INJ 10 MG/ML IV VIAL
INTRAVENOUS | 0 refills | Status: DC
Start: 2023-12-07 — End: 2023-12-07

## 2023-12-07 MED ORDER — PROPOFOL 10 MG/ML IV EMUL 50 ML (INFUSION)(AM)(OR)
INTRAVENOUS | 0 refills | Status: DC
Start: 2023-12-07 — End: 2023-12-07
  Administered 2023-12-07: 17:00:00 100 ug/kg/min via INTRAVENOUS

## 2023-12-07 NOTE — Anesthesia Post-Procedure Evaluation
 Post-Anesthesia Evaluation    Name: Johnny [name]      MRN: [id number]     DOB: 1945/10/03     Age: 78 y.o.     Sex: male   __________________________________________________________________________     Procedure Information       Anesthesia Start Date/Time: 12/07/23 1208    Procedure: ESOPHAGOGASTRODUODENOSCOPY WITH BIOPSY - FLEXIBLE    Location: [facility] / ENDO/GI    Surgeons: Tempie Hoist, DO            Post-Anesthesia Vitals  BP: 107/69 (03/20 1245)  Temp: 36.7 ?C (98 ?F) (03/20 1218)  Pulse: 78 (03/20 1245)  Respirations: 18 PER MINUTE (03/20 1245)  SpO2: 95 % (03/20 1245)  O2 Device: None (Room air) (03/20 1229)   Vitals Value Taken Time   BP 107/69 12/07/23 1245   Temp     Pulse 78 12/07/23 1245   Respirations 18 PER MINUTE 12/07/23 1245   SpO2 95 % 12/07/23 1245   O2 Device None (Room air) 12/07/23 1229   ABP     ART BP           Post Anesthesia Evaluation Note    Evaluation location: pre/post  Patient participation: recovered; patient participated in evaluation  Level of consciousness: alert    Pain score: 0  Pain management: adequate    Hydration: normovolemia  Temperature: 36.0?C - 38.4?C  Airway patency: adequate    Perioperative Events       Post-op nausea and vomiting: no PONV    Postoperative Status  Cardiovascular status: hemodynamically stable  Respiratory status: spontaneous ventilation        Perioperative Events  There were no known complications for this encounter.

## 2023-12-09 ENCOUNTER — Encounter: Admit: 2023-12-09 | Discharge: 2023-12-09 | Payer: MEDICARE

## 2023-12-11 ENCOUNTER — Encounter: Admit: 2023-12-11 | Discharge: 2023-12-11 | Payer: MEDICARE

## 2023-12-14 ENCOUNTER — Encounter: Admit: 2023-12-14 | Discharge: 2023-12-14 | Payer: MEDICARE

## 2023-12-14 NOTE — Telephone Encounter
 Pt left general VM stating he has a question or two and requesting return call. Contacted pt. He reports worsening urinary urgency d/t increased Lasix dosage. He acknowledges that this is likely a contributing factor to his urinary sx, but is wanting to know if there is anything he can do for his urinary sx as he understands Lasix is important for his cardiac health. He states that the Myrbetriq used to work well until he had to increase his Lasix. Now he reports that just running water at the sink or pouring water in a glass will make him have to go badly. He experiences leaking only when he really has to go. Pt inquiring if there is anything else he can try. Informed pt that I would review w/ PA Kovarik to see what he suggests and will contact pt Monday, if not today. He notes that cost is not a factor at this time. He also reports that he has some Toviaz leftover from an old Rx and may try it while awaiting on recommendations from [place]. He will not take this in combination with Myrbetriq. Pt appreciated the call and denied additional questions/concerns at this time.

## 2023-12-14 NOTE — Telephone Encounter
 Attempted to contact pt w/ recommendations per PA Kovarik. LVM w/ recommendations to either add Toviaz w/ Myrbetriq or consider bladder botox. Provided call back number if pt has any questions.

## 2023-12-27 ENCOUNTER — Encounter: Admit: 2023-12-27 | Discharge: 2023-12-27

## 2023-12-27 MED ORDER — FESOTERODINE 4 MG PO TB24
4 mg | ORAL_TABLET | Freq: Every day | ORAL | 0 refills | Status: AC
Start: 2023-12-27 — End: ?

## 2023-12-27 NOTE — Telephone Encounter
 Pt LVM stating he needs to speak with a nurse. States there has been more development to his situation. Contacted pt. He states he has done some experimenting and Myrbetriq is not working, not even close. He states it used to, but he no longer feels the sx improvement that he was experiencing at his recent appt. He states he tried some of his leftover Toviaz 8mg  and thinks it worked too well. He tried some Toviaz 4mg , but didn't think he had enough to truly check. He does think he is emptying and reports urinary sx are worse on days with his increased Lasix dose. He states once the Lasix wears off then his sx improve. Pt requesting to trial 30d Toviaz 4mg . Will send Rx to his pharmacy. Asked pt to update us  w/ how his sx are to determine if he would like a 90d supply or discuss alternative options. He v/u and pharmacy was confirmed. Pt thanked Charity fundraiser for the call.

## 2024-01-10 ENCOUNTER — Encounter: Admit: 2024-01-10 | Discharge: 2024-01-10 | Payer: MEDICARE

## 2024-01-10 MED ORDER — FUROSEMIDE 40 MG PO TAB
ORAL_TABLET | ORAL | 2 refills | 90.00000 days | Status: AC
Start: 2024-01-10 — End: ?

## 2024-01-13 ENCOUNTER — Encounter: Admit: 2024-01-13 | Discharge: 2024-01-13 | Payer: MEDICARE

## 2024-01-15 ENCOUNTER — Encounter: Admit: 2024-01-15 | Discharge: 2024-01-15 | Payer: MEDICARE

## 2024-01-15 ENCOUNTER — Ambulatory Visit: Admit: 2024-01-15 | Discharge: 2024-01-16 | Payer: MEDICARE

## 2024-01-15 DIAGNOSIS — M62838 Other muscle spasm: Secondary | ICD-10-CM

## 2024-01-15 DIAGNOSIS — M792 Neuralgia and neuritis, unspecified: Secondary | ICD-10-CM

## 2024-01-15 DIAGNOSIS — Z9682 Presence of neurostimulator: Secondary | ICD-10-CM

## 2024-01-15 DIAGNOSIS — M25561 Pain in right knee: Secondary | ICD-10-CM

## 2024-01-15 DIAGNOSIS — M5417 Radiculopathy, lumbosacral region: Secondary | ICD-10-CM

## 2024-01-15 DIAGNOSIS — Z9689 Presence of other specified functional implants: Secondary | ICD-10-CM

## 2024-01-15 NOTE — Patient Instructions
 General Instructions:  How to reach me: Please send a MyChart message to the [facility] or leave a voicemail for my nurse Lurena Joiner at [phone].  Scheduling: Our scheduling phone number is [phone]. You can also message scheduling through MyChart.   How to get a medication refill: Five business days before refill needed, please use the MyChart Refill request or contact your pharmacy directly to request medication refills.   How to receive your test results: If you have signed up for MyChart, you will receive your test results and messages from me this way. Otherwise, you will get a phone call or letter. If you are expecting results and have not heard from my office within 2 weeks of your testing, please send a MyChart message or call my office.  Support for many chronic illnesses is available through [employer]: [email or web address] or [phone].  For questions on nights, weekends or holidays, call the operator at [phone], and ask for the doctor on call for Anesthesia Pain Management.

## 2024-01-15 NOTE — Progress Notes
 [facility] Interventional Pain  Subjective     Chief Complaint:   Chief Complaint   Patient presents with    Lower Back - Pain    Pain       HPI: Johnny [name] is a 78 y.o. male who  has a past medical history of Abnormal involuntary movement (1994), Atrial fibrillation (CMS-HCC) (02/08/2009), Back pain, Bleeding disorder (when started Xarelto), CAD (coronary artery disease) (02/08/2009), Carpal tunnel syndrome, Cataract (2017), Chronic cough, Congestive heart disease (CMS-HCC), Constipation (2015), COPD (chronic obstructive pulmonary disease) (CMS-HCC), Degenerative disc disease, lumbar (2010), Erectile dysfunction, vasculogenic, Fracture, GERD (gastroesophageal reflux disease), Hearing reduced (2015), Heart attack (CMS-HCC) (possible in 2006), Hiatal hernia, anticoagulation, Hyperlipemia (02/11/2009), Hypertension (02/11/2009), 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 who presents for evaluation.     Patient presents to clinic for follow-up of LBP/knee pain.  He started having increasing pain in January and tried several things  Has Nalu PNS for Rt knee pain placed on 07/19/23  Nalu rep made adjustment yesterday, the middle part was adjusted to 32,now reports 60% relief       S/P SCS placement on 11/17/2021  Radiating pain in LLE has improved by 90%  Reports having low back and tailbone pain now  Denies left leg pain  Pain is worse with standing and walking  Better with rest and seating  He has to use 2 canes for support when walking  He stopped taking his Tizanidine, high dose makes him drowsy  He is willing to try low dose again     [employer] denies any recent fevers, chills, infection, antibiotics, bowel or bladder incontinence, saddle anesthesia. +Xarelto for a. fib.  Recent diagnosis of parkinsons      PRIOR MEDICATIONS:   Effective  Acetaminophen (little)    Ineffective  Gabapentin  Tizanidine      Unable to tolerate  NSAID    Never  Lyrica  Ami/Nortriptyline  Cymbalta      PRIOR INTERVENTIONS:  L-spine surgery with hardware L2-3 L3-4 and then later L4-5 and L5-1 next (2 surgeries, most recent in 2008) Right TKA with revision  Effective  Bilateral SIJ (OSH, good benefit)  Left L5-S2 TFESI  right knee RFA - genicular  bilateral L4-L5 - transient      Ineffective  ESI x3 (OSH)  Lumbar RFA      Past Medical History:  Past Medical History:    Abnormal involuntary movement    Atrial fibrillation (CMS-HCC)    Back pain    Bleeding disorder    CAD (coronary artery disease)    Carpal tunnel syndrome    Cataract    Chronic cough    Congestive heart disease (CMS-HCC)    Constipation    COPD (chronic obstructive pulmonary disease) (CMS-HCC)    Degenerative disc disease, lumbar    Erectile dysfunction, vasculogenic    Fracture    GERD (gastroesophageal reflux disease)    Hearing reduced    Heart attack (CMS-HCC)    Hiatal hernia    HX: anticoagulation    Hyperlipemia    Hypertension  ICD (implantable cardiac defibrillator) in place    Ischemic cardiomyopathy    Joint pain    Kidney disease    Mitral regurgitation    Movement disorder    Myasthenia gravis (CMS-HCC)    Myocardial infarction (CMS-HCC)    Obesity    Obesity, Class I, 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)    OSA on CPAP    Osteoarthritis    Other dysphagia    Parkinson disease (CMS-HCC)    Pre-diabetes    S/P ICD (internal cardiac defibrillator) procedure    Seasonal allergies    Spinal stenosis Ulcer       Family History:  Family History   Problem Relation Name Age of Onset    Cancer Father Deantre     Heart problem Father Jveon     Heart Disease Father Ethian     Hypertension Mother Felipa Horsfall     Joint Pain Mother Felipa Horsfall     Cancer Mother Felipa Horsfall     Cancer Sister Alecia Huntsman     Arthritis Mother Duwane Melchert       Social History:  Lives in [place] [address] (1.25 hours away)    Social History     Socioeconomic History    Marital status: Married     Spouse name: Trenia Fritter    Number of children: 4   Tobacco Use    Smoking status: Never    Smokeless tobacco: Never   Vaping Use    Vaping status: Never Used   Substance and Sexual Activity    Alcohol use: Not Currently     Comment: very rarely 4 drinks per year    Drug use: Never    Sexual activity: Yes     Partners: Female     Birth control/protection: Surgical       Allergies:  Allergies   Allergen Reactions    Clarithromycin RASH     [place] and burns both ends of GI tract    Ketoconazole SEE COMMENTS     Had bloodshot eyes after use, and sore.  Happens if he gets drug in his eyes.  Tolerates shampoo fine.    Lisinopril COUGH    Oxycodone UNKNOWN     Nightmares  Tolerates Tramadol       Medications:    Current Outpatient Medications:     acetaminophen (TYLENOL) 325 mg tablet, Take two tablets by mouth every 4 hours as needed for Pain., Disp: 300 tablet, Rfl: 1    albuterol (VENTOLIN HFA, PROAIR HFA) 90 mcg/actuation inhaler, Inhale two puffs by mouth into the lungs four times daily as needed for Wheezing., Disp: , Rfl:     alpha lipoic acid 600 mg capsule, Take one capsule by mouth daily., Disp: , Rfl:     aspirin 81 mg chewable tablet, Chew one tablet by mouth at bedtime daily., Disp: , Rfl:     carbidopa-levodopa (SINEMET) 25-100 mg tablet, Take two tablets by mouth three times daily., Disp: 540 tablet, Rfl: 3    cephalexin (KEFLEX) 500 mg capsule, Take one capsule by mouth four times daily., Disp: 28 capsule, Rfl: 0    cetirizine (ZYRTEC) 10 mg tablet, Take one tablet by mouth daily., Disp: , Rfl:     cholecalciferol (Vitamin D3) (VITAMIN D-3) 1,000 units tablet, Take one tablet by mouth twice daily.  , Disp: , Rfl:     coenzyme Q10 200 mg capsule, Take one capsule by mouth daily., Disp: , Rfl:  cyanocobalamin (vitamin B-12) 500 mcg tablet, Take one tablet by mouth daily., Disp: , Rfl:     Docusate Sodium 250 mg cap, Take 250mg  in the am and 500mg  in the evening, Disp: , Rfl:     ECHINACEA PO, Take 1 capsule by mouth daily., Disp: , Rfl:     ezetimibe (ZETIA) 10 mg tablet, TAKE 1 TABLET BY MOUTH AT BEDTIME, Disp: 90 tablet, Rfl: 3    famotidine (PEPCID) 40 mg tablet, Take one tablet by mouth daily., Disp: 90 tablet, Rfl: 1    fesoterodine ER (TOVIAZ) 4 mg tablet, Take one tablet by mouth daily. Do not cut/ crush/ chew, Disp: 30 tablet, Rfl: 0    fexofenadine(+) (ALLEGRA) 180 mg tablet, Take one tablet by mouth daily., Disp: , Rfl:     fluticasone-umeclidin-vilanter (TRELEGY ELLIPTA) 100-62.5-25 mcg inhaler, Inhale 1 Dose by mouth into the lungs daily after dinner., Disp: , Rfl:     furosemide (LASIX) 40 mg tablet, Take 2 tabs on Mon-Wed-Fri  Take 3 tabs on Sun-Tues-Thur-Sat, Disp: 226 tablet, Rfl: 2    gabapentin (NEURONTIN) 300 mg capsule, TAKE 1 CAPSULE BY MOUTH THREE TIMES DAILY (Patient taking differently: Take one capsule by mouth four times daily.), Disp: 270 capsule, Rfl: 3    glucosamine su 2KCl-chondroit 500-400 mg tab, Take 1 Tab by mouth twice daily., Disp: , Rfl:     guaiFENesin LA (MUCINEX) 600 mg tablet, Take one tablet by mouth twice daily., Disp: , Rfl:     ipratropium bromide (ATROVENT) 42 mcg (0.06 %) nasal spray, Apply two sprays to each nostril as directed twice daily as needed., Disp: , Rfl:     ketoconazole (NIZORAL) 2 % topical shampoo, Apply  topically to affected area every 7 days. Apply topically to affected area of damp skin, lather, leave on 5 minutes, and rinse., Disp: , Rfl:     losartan (COZAAR) 50 mg tablet, TAKE 1 TABLET BY MOUTH EVERY DAY, Disp: 90 tablet, Rfl: 3    methocarbamoL (ROBAXIN) 500 mg tablet, Take one tablet by mouth three times daily as needed for Spasms., Disp: 90 tablet, Rfl: 3    metoprolol succinate XL (TOPROL XL) 100 mg extended release tablet, TAKE 1 TABLET BY MOUTH EVERY DAY, Disp: 90 tablet, Rfl: 3    mucus clearing device (AEROBIKA OSCILLATING PEP SYSTM MISC), Use  as directed. Use with salt water and inhale twice daily, Disp: , Rfl:     OMEGA-3 FATTY ACIDS-FISH OIL PO, Take 1 capsule by mouth twice daily., Disp: , Rfl:     omeprazole DR (PRILOSEC) 40 mg capsule, TAKE 1 CAPSULE BY MOUTH TWICE DAILY, Disp: 180 capsule, Rfl: 1    other medication, one Dose. Mupirocin dissolved in warm water and Budesonide in NS  qhs  (sometimes increases to BID), Disp: , Rfl:     polyethylene glycol 3350 (MIRALAX) 17 g packet, Take one packet by mouth daily as needed., Disp: , Rfl:     potassium chloride SR (K-DUR) 20 mEq tablet, TAKE 2 TABLETS BY MOUTH ON MONDAY, WEDNESDAY AND FRIDAY AND 1 TABLET ALL OTHER DAYS, Disp: 140 tablet, Rfl: 2    predniSONE (DELTASONE) 5 mg tablet, , Disp: , Rfl:     pyRIDostigmine bromide (MESTINON) 60 mg tablet, Take two tablets by mouth three times daily.  , Disp: , Rfl:     rosuvastatin (CRESTOR) 40 mg tablet, TAKE 1 TABLET BY MOUTH AT BEDTIME, Disp: 90 tablet, Rfl: 3    spironolactone (ALDACTONE) 25 mg  tablet, TAKE 1 TABLET BY MOUTH EVERY DAY WITH FOOD, Disp: 90 tablet, Rfl: 3    tamsulosin (FLOMAX) 0.4 mg capsule, Take one capsule by mouth daily., Disp: , Rfl:     testosterone cypionate 200 mg/mL kit, Inject 1 mL into the muscle every 14 days., Disp: , Rfl:     traMADoL (ULTRAM) 50 mg tablet, Take one tablet by mouth every 8 hours as needed for Pain., Disp: 25 tablet, Rfl: 0    traZODone (DESYREL) 100 mg tablet, Take one tablet by mouth at bedtime daily., Disp: , Rfl:     Vacuum Erection Device System kit, Use as directed for sexual activity., Disp: 1 kit, Rfl: 11    warfarin (COUMADIN) 3 mg tablet, Take one tablet by mouth at bedtime daily., Disp: , Rfl:     Physical examination:   BP (P) 114/61  - Pulse 87  - Ht 180.3 cm (5' 11)  - Wt 103.4 kg (228 lb)  - SpO2 98%  - BMI 31.80 kg/m?   Pain Score: Six    Physical Exam:  General: The patient is a well-developed, well nourished 78 y.o. male in no acute distress.   HEENT: Head is normocephalic and atraumatic. Pupils are equal and reactive to light bilaterally.   Cardiac: Based on palpation, pulse appears to be regular rate and rhythm.   Pulmonary: The patient has unlabored respirations and bilateral symmetric chest excursion.   Abdomen: Soft, nontender, and nondistended.   Extremities: No clubbing, cyanosis, or edema.     Neurologic:   The patient is alert and oriented times 3.       Musculoskeletal:   Gait is antalgic - cane  right knee TTP - some edema noted      Lumbar spine:  Appearance: No lesions or deformity  Lumbar tenderness: moderate tenderness, increased paraspinal muscle tone  SI joint tenderness: None  Pain with extension: Yes  Pain with lateral flexion: None  FABER positive?: None  Lower extremity strength: 5/5 bilaterally  Sensation to light touch: Intact and equal in the bilateral lower extremities  Straight leg raise positive?: Bilateral      MS:   Root Right Left   Hip Flexion L2 5 5   Knee Flexion L5/S1 5 5   Knee Extension L3 5 5   Dorsiflexion L4 5 5   Plantarflexion S1 5 5   EHL Extension L5 5 5       Results for orders placed during the hospital encounter of 08/24/20    MRI L-SPINE WO/W CONTRAST    Addendum 08/25/2020  5:39 PM  Finalized by Marco Severs, M.D. on 08/24/2020 5:00 PM. Dictated by Jinx Mourning, M.D. on 08/24/2020 3:54 PM.Addendum:  Jinx Mourning discussed these findings via telephone with Dr. Lenn Quint nurse, Marvell Slider, at 8:34 AM on 08/25/2020.      Approved by Jinx Mourning, M.D. on 08/25/2020 8:35 AM    By my electronic signature, I attest that I have personally reviewed the images for this examination and formulated the interpretations and opinions expressed in this report      Finalized by Marco Severs, M.D. on 08/25/2020 5:36 PM. Dictated by Jinx Mourning, M.D. on 08/25/2020 8:33 AM.    Narrative  EXAM: MRI L-SPINE    HISTORY:    , lumbar radiculapathy,    Technique: Multiple sagittal and axial MR sequences were obtained of the lumbar spine with and without MultiHance contrast.    Comparison: CT lumbar spine August 27, 2014  FINDINGS:    There are 5 nonrib-bearing lumbar type vertebral bodies. There is left convexity lumbar spinal curvature. Trace retrolisthesis at L2-L3 and trace anterolisthesis of L5-S1. X-Stop devices are seen at the spinous processes of the lumbar spine. Vertebral body heights are maintained. Multilevel vertebral endplate marrow changes. No suspicious bone marrow replacing lesion. The conus is normal in appearance and position at the T12-L1 level. abnormal enhancing lesion is identified. Bilateral renal atrophy. Small upper pole right renal cyst. There is an additional 1.3 cm upper pole right renal lesion which demonstrates T2 and T1 hypointensity (series 5, image 8).    T12-L1: No significant central spinal or neuroforaminal stenosis.    L1-L2: Marked disc degeneration, mild circumferential disc bulge, and facet hypertrophy. Mild central spinal stenosis. Moderate left and marked right neural foraminal stenosis.    L2-L3: Trace retrolisthesis, moderate disc degeneration, circumferential disc bulge, facet hypertrophy, and mild malignant flavum thickening. Mild central spinal stenosis. Moderate left and marked right neuroforaminal stenosis.    L3-L4: Moderate disc degeneration, circumferential disc bulge, and facet hypertrophy. Moderate to marked spinal stenosis. Marked bilateral neuroforaminal stenosis.    L4-L5: Mild disc degeneration, circumferential disc bulge with superimposed central disc protrusion, ligamentum flavum thickening, and facet hypertrophy. Moderate to marked central spinal stenosis. Marked bilateral neural foraminal stenosis.    L5-S1: Mild disc degeneration, circumferential disc bulge, and facet hypertrophy. Moderate central spinal stenosis. Marked bilateral neural foraminal stenosis.    Impression  1.  Multilevel degenerative central spinal stenosis, greatest of moderate to marked degree at L3-L4, L4-L5, and L5-S1.  2.  Multilevel degenerative neuroforaminal stenosis, greatest of marked degree on the right at L1-L2 and L2-L3 as well as bilaterally at L3-L4, L4-5, and L5-S1.  3.  Left convexity lumbar spinal curvature.  4.  Trace retrolisthesis of L2-L3 and trace anterolisthesis of L5-S1.  5.  T2 and T1 hypointense peripheral right renal lesion. MRI abdomen (renal mass protocol) is recommended for further evaluation.    By my electronic signature, I attest that I have personally reviewed the images for this examination and formulated the interpretations and opinions expressed in this report          Last Cr and LFT's:  Creatinine   Date Value Ref Range Status   03/31/2021 1.30 (H) 0.4 - 1.24 MG/DL Final     AST (SGOT)   Date Value Ref Range Status   11/22/2018 25 7 - 40 U/L Final     ALT (SGPT)   Date Value Ref Range Status   11/22/2018 24 7 - 56 U/L Final     Alk Phosphatase   Date Value Ref Range Status   11/22/2018 51 25 - 110 U/L Final     Total Bilirubin   Date Value Ref Range Status   11/22/2018 0.8 0.3 - 1.2 MG/DL Final          Assessment:    Johnny [name] is a 78 y.o. male who  has a past medical history of Abnormal involuntary movement (1994), Atrial fibrillation (CMS-HCC) (02/08/2009), Back pain, Bleeding disorder (when started Xarelto), CAD (coronary artery disease) (02/08/2009), Carpal tunnel syndrome, Cataract (2017), Chronic cough, Congestive heart disease (CMS-HCC), Constipation (2015), COPD (chronic obstructive pulmonary disease) (CMS-HCC), Degenerative disc disease, lumbar (2010), Erectile dysfunction, vasculogenic, Fracture, GERD (gastroesophageal reflux disease), Hearing reduced (2015), Heart attack (CMS-HCC) (possible in 2006), Hiatal hernia, anticoagulation, Hyperlipemia (02/11/2009), Hypertension (02/11/2009), 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 who presents for evaluation of pain.    The pain complaints are most likely due to:    1. Lumbosacral radiculopathy        2. Neuropathic pain        3. Peripheral nerve neurostimulator device in situ        4. Spinal cord stimulator status        5. Muscle spasm        6. Chronic pain of right knee              Patient has had an adequate trial of > 12 months of rest, exercise, multimodal treatment, and the passage of time without improvement of symptoms. The pain has significant impact on the daily quality of life.     Plan:    1.Discussed care options with patient.  2. Nevro rep plan to turn off the stim for 7 days to reset  3. Nalu made adjustment yesterday and will follow-up with patient at the end of the week  4. Trial of Tizanidine 1-2 tabs qhs PRN for muscle spasms  5. Encouraged to continue at home PT program.  6. Follow up 4-5 weeks      Risks/benefits of all pharmacologic and interventional treatments discussed and questions answered.

## 2024-01-18 ENCOUNTER — Encounter: Admit: 2024-01-18 | Discharge: 2024-01-18 | Payer: MEDICARE

## 2024-01-18 MED ORDER — FAMOTIDINE 40 MG PO TAB
40 mg | ORAL_TABLET | Freq: Every day | ORAL | 0 refills | 90.00000 days | Status: AC
Start: 2024-01-18 — End: ?

## 2024-01-18 NOTE — Telephone Encounter
 All Protocol Elements met  Medication name:    Name from pharmacy: FAMOTIDINE 40MG          Will file in chart as: famotidine (PEPCID) 40 mg tablet    Sig: TAKE 1 TABLET BY MOUTH EVERY DAY    Disp: 90 tablet    Refills: 0    Start: 01/18/2024    Class: Normal    Non-formulary    To pharmacy: 01/18/2024 9:12:30 AM* * N O T I C E * * Last quantity doesn't match original quantity    Last ordered: 5 months ago (08/11/2023) by Leonia Raman, DO    Last refill: 12/18/2023    Rx #: [id number]    Gastroenterology: Acid Reflux Passed05/09/2023 09:13 AM   Protocol Details Valid encounter within last 12 months      To be filled at: [facility] [place], [place] - [address]       Patient was last seen on 11/20/23 and should be seen on or around 04/24/24.

## 2024-01-23 ENCOUNTER — Encounter: Admit: 2024-01-23 | Discharge: 2024-01-23 | Payer: MEDICARE

## 2024-01-25 ENCOUNTER — Encounter: Admit: 2024-01-25 | Discharge: 2024-01-25 | Payer: MEDICARE

## 2024-01-25 ENCOUNTER — Ambulatory Visit: Admit: 2024-01-25 | Discharge: 2024-01-26 | Payer: MEDICARE

## 2024-01-30 ENCOUNTER — Encounter: Admit: 2024-01-30 | Discharge: 2024-01-30 | Payer: MEDICARE

## 2024-01-30 MED ORDER — FESOTERODINE 4 MG PO TB24
4 mg | ORAL_TABLET | Freq: Every day | ORAL | 0 refills | 30.00000 days | Status: AC
Start: 2024-01-30 — End: ?

## 2024-01-30 NOTE — Telephone Encounter
 Pt left general VM requesting return call. Contacted pt. He reports that his urinary sx are quite a bit better w/ Toviaz. He has not been using Myrbetriq. He reports in the morning after taking lasix he has a good, strong stream, but by nighttime it is slow to start and small stream. Last night he skipped a dose of Toviaz and had no issues urinating. He would like to continue Toviaz and skip doses if he feels his urinary stream is slow. Informed pt I would like to review w/ Josiah Nigh. Discussed he will either need to obtain future refills from PCP or f/u w/ our office. He is not interested in bladder botox. Will contact pt after reviewing w/ Josiah Nigh.    Reviewed w/ PA Kovarik. Recommends 90d supply of Toviaz and future refills from PCP. If pt's urinary sx worsen in the future, recommends referral to Dr. Geary Kells, Dr. Kriste Petite, or Dr. Dorothe Gaster. Contacted pt w/ recommendations. He is agreeable to ask PCP for refills. He feels somewhat uncomfortable discussing these issues with her since she is the age of his kids and knows her from church, but he thinks he can get past this. Reassurance provided. Offered an appt with urology, if he prefers, but is not necessary if he is stable on medications and PCP was refilling the medications prior. He v/u and will plan to discuss w/ PCP. He thanked Charity fundraiser for the call.

## 2024-02-19 ENCOUNTER — Encounter: Admit: 2024-02-19 | Discharge: 2024-02-19 | Payer: MEDICARE

## 2024-02-23 ENCOUNTER — Encounter: Admit: 2024-02-23 | Discharge: 2024-02-23 | Payer: MEDICARE

## 2024-02-23 ENCOUNTER — Ambulatory Visit: Admit: 2024-02-23 | Discharge: 2024-02-24 | Payer: MEDICARE

## 2024-02-23 DIAGNOSIS — Z9682 Presence of neurostimulator: Secondary | ICD-10-CM

## 2024-02-23 DIAGNOSIS — M25561 Pain in right knee: Secondary | ICD-10-CM

## 2024-02-23 DIAGNOSIS — Z9689 Presence of other specified functional implants: Secondary | ICD-10-CM

## 2024-02-23 DIAGNOSIS — M792 Neuralgia and neuritis, unspecified: Secondary | ICD-10-CM

## 2024-02-23 DIAGNOSIS — M5417 Radiculopathy, lumbosacral region: Secondary | ICD-10-CM

## 2024-02-23 NOTE — Progress Notes
 [facility] Inter[facility]  Subjective     Chief Complaint:   Chief Complaint   Patient presents with    Lower Back - Pain    Right Knee - Pain    Follow Up       HPI: Sabas Frett is a 78 y.o. male who  has a past medical history of Abnormal involuntary movement (1994), ADHD (attention deficit hyperactivity disorder), Atrial fibrillation (CMS-HCC) (02/08/2009), Back pain, Bleeding disorder (when started Xarelto), CAD (coronary artery disease) (02/08/2009), Cardiac dysrhythmia (2007), Carpal tunnel syndrome, Cataract (2017), Chronic cough, Congestive heart disease (CMS-HCC), Constipation (2015), COPD (chronic obstructive pulmonary disease) (CMS-HCC), Degenerative disc disease, lumbar (2010), Erectile dysfunction, vasculogenic, Fracture, GERD (gastroesophageal reflux disease), Hearing reduced (2015), Heart attack (CMS-HCC) (possible in 2006), Hiatal hernia, anticoagulation, Hyperlipemia (02/11/2009), Hypertension (02/11/2009), 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 who presents for evaluation.    4 weeks follow-up  S/P SCS Nevro placement on 11/17/2021  Stim was turned off for 10-14 days and was turned back on 01/22/24  Currently on program 1. Level 6  He adjusted it twice since its been turned back on  Reports having low back and tailbone pain   Reports 75% relief after adjustments  Pain is worse with standing and walking  Better with rest and seating  He has to use 2 canes for support when walking     Has Nalu PNS for Rt knee pain placed on 07/19/23  Reports 80% relief after it was adjusted from 32 to 16  States he was having a lot of Rt thigh and knee tightness when it was on 32    States they just came back from a 2 week vacation  He did fine but has to use scooter for long distance  He was able to drive for 4 days back and forth to [place]    He stopped taking his Tizanidine, high dose makes him drowsy    [employer] denies any recent fevers, chills, infection, antibiotics, bowel or bladder incontinence, saddle anesthesia. +Xarelto for a. fib.  Recent diagnosis of parkinsons      PRIOR MEDICATIONS:   Effective  Acetaminophen (little)    Ineffective  Gabapentin  Tizanidine      Unable to tolerate  NSAID    Never  Lyrica  Ami/Nortriptyline  Cymbalta      PRIOR INTERVENTIONS:  L-spine surgery with hardware L2-3 L3-4 and then later L4-5 and L5-1 next (2 surgeries, most recent in 2008) Right TKA with revision  Effective  Bilateral SIJ (OSH, good benefit)  Left L5-S2 TFESI  right knee RFA - genicular  bilateral L4-L5 - transient      Ineffective  ESI x3 (OSH)  Lumbar RFA      Past Medical History:  Past Medical History:    Abnormal involuntary movement    ADHD (attention deficit hyperactivity disorder)    Atrial fibrillation (CMS-HCC)    Back pain    Bleeding disorder    CAD (coronary artery disease)    Cardiac dysrhythmia    Carpal tunnel syndrome    Cataract  Chronic cough    Congestive heart disease (CMS-HCC)    Constipation    COPD (chronic obstructive pulmonary disease) (CMS-HCC)    Degenerative disc disease, lumbar    Erectile dysfunction, vasculogenic    Fracture    GERD (gastroesophageal reflux disease)    Hearing reduced    Heart attack (CMS-HCC)    Hiatal hernia    HX: anticoagulation    Hyperlipemia    Hypertension    ICD (implantable cardiac defibrillator) in place    Ischemic cardiomyopathy    Joint pain    Kidney disease    Mitral regurgitation    Movement disorder    Myasthenia gravis (CMS-HCC)    Myocardial infarction (CMS-HCC)    Obesity    Obesity, Class I, 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)    OSA on CPAP    Osteoarthritis    Other dysphagia    Parkinson disease (CMS-HCC)    Pre-diabetes    S/P ICD (internal cardiac defibrillator) procedure    Seasonal allergies    Spinal stenosis    Ulcer       Family History:  Family History   Problem Relation Name Age of Onset    Cancer Father Ahmod     Heart problem Father Benedetto     Heart Disease Father Arye     Hypertension Mother Felipa Horsfall     Joint Pain Mother Felipa Horsfall     Cancer Mother Felipa Horsfall     Cancer Sister Alecia Huntsman     Arthritis Mother Giomar Gusler       Social History:  Lives in [place] [id number] (1.25 hours away)    Social History     Socioeconomic History    Marital status: Married     Spouse name: Trenia Fritter    Number of children: 4   Tobacco Use    Smoking status: Never    Smokeless tobacco: Never   Vaping Use    Vaping status: Never Used   Substance and Sexual Activity    Alcohol use: Not Currently     Comment: very rarely 4 drinks per year    Drug use: Never    Sexual activity: Yes     Partners: Female     Birth control/protection: Surgical       Allergies:  Allergies   Allergen Reactions    Clarithromycin RASH     [place]den and burns both ends of GI tract    Ketoconazole SEE COMMENTS     Had bloodshot eyes after use, and sore.  Happens if he gets drug in his eyes.  Tolerates shampoo fine.    Lisinopril COUGH    Oxycodone UNKNOWN     Nightmares  Tolerates Tramadol       Medications:    Current Outpatient Medications:     acetaminophen (TYLENOL) 325 mg tablet, Take two tablets by mouth every 4 hours as needed for Pain., Disp: 300 tablet, Rfl: 1    albuterol (VENTOLIN HFA, PROAIR HFA) 90 mcg/actuation inhaler, Inhale two puffs by mouth into the lungs four times daily as needed for Wheezing., Disp: , Rfl:     alpha lipoic acid 600 mg capsule, Take one capsule by mouth daily., Disp: , Rfl:     aspirin 81 mg chewable tablet, Chew one tablet by mouth at bedtime daily., Disp: , Rfl:     carbidopa-levodopa (SINEMET) 25-100 mg tablet, Take two tablets by mouth three times daily., Disp: 540 tablet, Rfl:  3    cephalexin (KEFLEX) 500 mg capsule, Take one capsule by mouth four times daily., Disp: 28 capsule, Rfl: 0    cetirizine (ZYRTEC) 10 mg tablet, Take one tablet by mouth daily., Disp: , Rfl:     cholecalciferol (Vitamin D3) (VITAMIN D-3) 1,000 units tablet, Take one tablet by mouth twice daily.  , Disp: , Rfl:     coenzyme Q10 200 mg capsule, Take one capsule by mouth daily., Disp: , Rfl:     cyanocobalamin (vitamin B-12) 500 mcg tablet, Take one tablet by mouth daily., Disp: , Rfl:     Docusate Sodium 250 mg cap, Take 250mg  in the am and 500mg  in the evening, Disp: , Rfl:     ECHINACEA PO, Take 1 capsule by mouth daily., Disp: , Rfl:     ezetimibe (ZETIA) 10 mg tablet, TAKE 1 TABLET BY MOUTH AT BEDTIME, Disp: 90 tablet, Rfl: 3    famotidine (PEPCID) 40 mg tablet, TAKE 1 TABLET BY MOUTH EVERY DAY, Disp: 90 tablet, Rfl: 0    fesoterodine ER (TOVIAZ) 4 mg tablet, Take one tablet by mouth daily. Do not cut/ crush/ chew, Disp: 90 tablet, Rfl: 0    fexofenadine(+) (ALLEGRA) 180 mg tablet, Take one tablet by mouth daily., Disp: , Rfl:     fluticasone-umeclidin-vilanter (TRELEGY ELLIPTA) 100-62.5-25 mcg inhaler, Inhale 1 Dose by mouth into the lungs daily after dinner., Disp: , Rfl:     furosemide (LASIX) 40 mg tablet, Take 2 tabs on Mon-Wed-Fri  Take 3 tabs on Sun-Tues-Thur-Sat, Disp: 226 tablet, Rfl: 2    gabapentin (NEURONTIN) 300 mg capsule, TAKE 1 CAPSULE BY MOUTH THREE TIMES DAILY (Patient taking differently: Take one capsule by mouth four times daily.), Disp: 270 capsule, Rfl: 3    glucosamine su 2KCl-chondroit 500-400 mg tab, Take 1 Tab by mouth twice daily., Disp: , Rfl:     guaiFENesin LA (MUCINEX) 600 mg tablet, Take one tablet by mouth twice daily., Disp: , Rfl:     ipratropium bromide (ATROVENT) 42 mcg (0.06 %) nasal spray, Apply two sprays to each nostril as directed twice daily as needed., Disp: , Rfl:     ketoconazole (NIZORAL) 2 % topical shampoo, Apply  topically to affected area every 7 days. Apply topically to affected area of damp skin, lather, leave on 5 minutes, and rinse., Disp: , Rfl:     losartan (COZAAR) 50 mg tablet, TAKE 1 TABLET BY MOUTH EVERY DAY, Disp: 90 tablet, Rfl: 3    methocarbamoL (ROBAXIN) 500 mg tablet, Take one tablet by mouth three times daily as needed for Spasms., Disp: 90 tablet, Rfl: 3    metoprolol succinate XL (TOPROL XL) 100 mg extended release tablet, TAKE 1 TABLET BY MOUTH EVERY DAY, Disp: 90 tablet, Rfl: 3    mucus clearing device (AEROBIKA OSCILLATING PEP SYSTM MISC), Use  as directed. Use with salt water and inhale twice daily, Disp: , Rfl:     OMEGA-3 FATTY ACIDS-FISH OIL PO, Take 1 capsule by mouth twice daily., Disp: , Rfl:     omeprazole DR (PRILOSEC) 40 mg capsule, TAKE 1 CAPSULE BY MOUTH TWICE DAILY, Disp: 180 capsule, Rfl: 1    other medication, one Dose. Mupirocin dissolved in warm water and Budesonide in NS  qhs  (sometimes increases to BID), Disp: , Rfl:     polyethylene glycol 3350 (MIRALAX) 17 g packet, Take one packet by mouth daily as needed., Disp: , Rfl:     potassium chloride SR (K-DUR) 20  mEq tablet, TAKE 2 TABLETS BY MOUTH ON MONDAY, WEDNESDAY AND FRIDAY AND 1 TABLET ALL OTHER DAYS, Disp: 140 tablet, Rfl: 2    predniSONE (DELTASONE) 5 mg tablet, , Disp: , Rfl:     pyRIDostigmine bromide (MESTINON) 60 mg tablet, Take two tablets by mouth three times daily.  , Disp: , Rfl:     rosuvastatin (CRESTOR) 40 mg tablet, TAKE 1 TABLET BY MOUTH AT BEDTIME, Disp: 90 tablet, Rfl: 3    spironolactone (ALDACTONE) 25 mg tablet, TAKE 1 TABLET BY MOUTH EVERY DAY WITH FOOD, Disp: 90 tablet, Rfl: 3    tamsulosin (FLOMAX) 0.4 mg capsule, Take one capsule by mouth daily., Disp: , Rfl:     testosterone cypionate 200 mg/mL kit, Inject 1 mL into the muscle every 14 days., Disp: , Rfl:     traMADoL (ULTRAM) 50 mg tablet, Take one tablet by mouth every 8 hours as needed for Pain., Disp: 25 tablet, Rfl: 0    traZODone (DESYREL) 100 mg tablet, Take one tablet by mouth at bedtime daily., Disp: , Rfl:     Vacuum Erection Device System kit, Use as directed for sexual activity., Disp: 1 kit, Rfl: 11    warfarin (COUMADIN) 3 mg tablet, Take one tablet by mouth at bedtime daily., Disp: , Rfl:     Physical examination:   BP 110/70  - Pulse 84  - Ht 180.3 cm (5' 11)  - Wt 101.6 kg (224 lb)  - SpO2 98%  - BMI 31.24 kg/m?   Pain Score: Six    Physical Exam:  General: The patient is a well-developed, well nourished 78 y.o. male in no acute distress.   HEENT: Head is normocephalic and atraumatic. Pupils are equal and reactive to light bilaterally.   Cardiac: Based on palpation, pulse appears to be regular rate and rhythm.   Pulmonary: The patient has unlabored respirations and bilateral symmetric chest excursion.   Abdomen: Soft, nontender, and nondistended.   Extremities: No clubbing, cyanosis, or edema.     Neurologic:   The patient is alert and oriented times 3.       Musculoskeletal:   Gait is antalgic - cane  right knee TTP - some edema noted      Lumbar spine:  Appearance: No lesions or deformity  Lumbar tenderness: moderate tenderness, increased paraspinal muscle tone  SI joint tenderness: None  Pain with extension: Yes  Pain with lateral flexion: None  FABER positive?: None  Lower extremity strength: 5/5 bilaterally  Sensation to light touch: Intact and equal in the bilateral lower extremities  Straight leg raise positive?: Bilateral      MS:   Root Right Left   Hip Flexion L2 5 5   Knee Flexion L5/S1 5 5   Knee Extension L3 5 5   Dorsiflexion L4 5 5   Plantarflexion S1 5 5   EHL Extension L5 5 5       Results for orders placed during the hospital encounter of 08/24/20    MRI L-SPINE WO/W CONTRAST    Addendum 08/25/2020  5:39 PM  Finalized by Marco Severs, M.D. on 08/24/2020 5:00 PM. Dictated by Jinx Mourning, M.D. on 08/24/2020 3:54 PM.Addendum:  Jinx Mourning discussed these findings via telephone with Dr. Lenn Quint nurse, Marvell Slider, at 8:34 AM on 08/25/2020.      Approved by Jinx Mourning, M.D. on 08/25/2020 8:35 AM    By my electronic signature, I attest that I have personally reviewed the images for this  examination and formulated the interpretations and opinions expressed in this report      Finalized by Marco Severs, M.D. on 08/25/2020 5:36 PM. Dictated by Jinx Mourning, M.D. on 08/25/2020 8:33 AM.    Narrative  EXAM: MRI L-SPINE    HISTORY:    , lumbar radiculapathy,    Technique: Multiple sagittal and axial MR sequences were obtained of the lumbar spine with and without MultiHance contrast.    Comparison: CT lumbar spine August 27, 2014    FINDINGS:    There are 5 nonrib-bearing lumbar type vertebral bodies. There is left convexity lumbar spinal curvature. Trace retrolisthesis at L2-L3 and trace anterolisthesis of L5-S1. X-Stop devices are seen at the spinous processes of the lumbar spine. Vertebral body heights are maintained. Multilevel vertebral endplate marrow changes. No suspicious bone marrow replacing lesion. The conus is normal in appearance and position at the T12-L1 level. abnormal enhancing lesion is identified. Bilateral renal atrophy. Small upper pole right renal cyst. There is an additional 1.3 cm upper pole right renal lesion which demonstrates T2 and T1 hypointensity (series 5, image 8).    T12-L1: No significant central spinal or neuroforaminal stenosis.    L1-L2: Marked disc degeneration, mild circumferential disc bulge, and facet hypertrophy. Mild central spinal stenosis. Moderate left and marked right neural foraminal stenosis.    L2-L3: Trace retrolisthesis, moderate disc degeneration, circumferential disc bulge, facet hypertrophy, and mild malignant flavum thickening. Mild central spinal stenosis. Moderate left and marked right neuroforaminal stenosis.    L3-L4: Moderate disc degeneration, circumferential disc bulge, and facet hypertrophy. Moderate to marked spinal stenosis. Marked bilateral neuroforaminal stenosis.    L4-L5: Mild disc degeneration, circumferential disc bulge with superimposed central disc protrusion, ligamentum flavum thickening, and facet hypertrophy. Moderate to marked central spinal stenosis. Marked bilateral neural foraminal stenosis.    L5-S1: Mild disc degeneration, circumferential disc bulge, and facet hypertrophy. Moderate central spinal stenosis. Marked bilateral neural foraminal stenosis.    Impression  1.  Multilevel degenerative central spinal stenosis, greatest of moderate to marked degree at L3-L4, L4-L5, and L5-S1.  2.  Multilevel degenerative neuroforaminal stenosis, greatest of marked degree on the right at L1-L2 and L2-L3 as well as bilaterally at L3-L4, L4-5, and L5-S1.  3.  Left convexity lumbar spinal curvature.  4.  Trace retrolisthesis of L2-L3 and trace anterolisthesis of L5-S1.  5.  T2 and T1 hypointense peripheral right renal lesion. MRI abdomen (renal mass protocol) is recommended for further evaluation.    By my electronic signature, I attest that I have personally reviewed the images for this examination and formulated the interpretations and opinions expressed in this report          Last Cr and LFT's:  Creatinine   Date Value Ref Range Status   03/31/2021 1.30 (H) 0.4 - 1.24 MG/DL Final     AST (SGOT)   Date Value Ref Range Status   11/22/2018 25 7 - 40 U/L Final     ALT (SGPT)   Date Value Ref Range Status   11/22/2018 24 7 - 56 U/L Final     Alk Phosphatase   Date Value Ref Range Status   11/22/2018 51 25 - 110 U/L Final     Total Bilirubin   Date Value Ref Range Status   11/22/2018 0.8 0.3 - 1.2 MG/DL Final          Assessment:    Adian Jablonowski is a 78 y.o. 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 started Xarelto), CAD (coronary artery disease) (02/08/2009), Cardiac dysrhythmia (2007), Carpal tunnel syndrome, Cataract (2017), Chronic cough, Congestive heart disease (CMS-HCC), Constipation (2015), COPD (chronic obstructive pulmonary disease) (CMS-HCC), Degenerative disc disease, lumbar (2010), Erectile dysfunction, vasculogenic, Fracture, GERD (gastroesophageal reflux disease), Hearing reduced (2015), Heart attack (CMS-HCC) (possible in 2006), Hiatal hernia, anticoagulation, Hyperlipemia (02/11/2009), Hypertension (02/11/2009), 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 who presents for evaluation of pain.    The pain complaints are most likely due to:    1. Lumbosacral radiculopathy        2. Neuropathic pain        3. Spinal cord stimulator status        4. Peripheral nerve neurostimulator device in situ        5. Chronic pain of right knee            Patient has had an adequate trial of > 12 months of rest, exercise, multimodal treatment, and the passage of time without improvement of symptoms. The pain has significant impact on the daily quality of life.     Plan:    Nevro stim rep [place] Dales) here today for optimization. She will adjust the setting to Program 1 level 7 from level 6  2.  Patient will follow-up with Nalu rep if he will have issue with his PNS or need adjustment for his Rt knee pain   3. Encouraged to continue at home PT program.  6. Follow up in 4 months for his 1 year Nalu placement      Risks/benefits of all pharmacologic and interventional treatments discussed and questions answered.

## 2024-03-05 ENCOUNTER — Encounter: Admit: 2024-03-05 | Discharge: 2024-03-05 | Payer: MEDICARE

## 2024-03-06 ENCOUNTER — Ambulatory Visit: Admit: 2024-03-06 | Discharge: 2024-03-06 | Payer: MEDICARE

## 2024-03-06 ENCOUNTER — Encounter: Admit: 2024-03-06 | Discharge: 2024-03-06 | Payer: MEDICARE

## 2024-03-07 ENCOUNTER — Encounter: Admit: 2024-03-07 | Discharge: 2024-03-07 | Payer: MEDICARE

## 2024-03-10 ENCOUNTER — Encounter: Admit: 2024-03-10 | Discharge: 2024-03-10 | Payer: MEDICARE

## 2024-03-10 DIAGNOSIS — Z95810 Presence of automatic (implantable) cardiac defibrillator: Secondary | ICD-10-CM

## 2024-03-10 DIAGNOSIS — I480 Paroxysmal atrial fibrillation: Secondary | ICD-10-CM

## 2024-03-11 ENCOUNTER — Encounter: Admit: 2024-03-11 | Discharge: 2024-03-11 | Payer: MEDICARE

## 2024-03-11 NOTE — Telephone Encounter
 Pt leaves message that he wants to talk to [place]. Called pt back to say that Tonda is out of office today and asked if RN can help. Pt states that he is talking to Nalu rep at the moment and will call again.

## 2024-03-12 ENCOUNTER — Encounter: Admit: 2024-03-12 | Discharge: 2024-03-12 | Payer: MEDICARE

## 2024-03-12 MED ORDER — OMEPRAZOLE 40 MG PO CPDR
40 mg | ORAL_CAPSULE | Freq: Two times a day (BID) | ORAL | 0 refills | 90.00000 days | Status: AC
Start: 2024-03-12 — End: ?

## 2024-03-12 MED ORDER — FAMOTIDINE 40 MG PO TAB
40 mg | ORAL_TABLET | Freq: Every day | ORAL | 0 refills | 90.00000 days | Status: AC
Start: 2024-03-12 — End: ?

## 2024-03-12 NOTE — Telephone Encounter
 All Protocol Elements met  Medication name:   Name from pharmacy: FAMOTIDINE  40MG           Will file in chart as: famotidine  (PEPCID ) 40 mg tablet    Sig: TAKE 1 TABLET BY MOUTH EVERY DAY    Disp: 90 tablet    Refills: 0    Start: 03/12/2024    Class: Normal    To pharmacy: * * N O T I C E * * Last quantity doesn't match original quantity    Last ordered: 1 month ago (01/18/2024) by Emerick JINNY Mace, DO    Last refill: 01/18/2024    Rx #: [id number]    Gastroenterology: Acid Reflux Passed06/24/2025 03:50 PM   Protocol Details Valid encounter within last 12 months      To be filled at: [facility] [place], [place] - [address]         Patient was last seen on 11/20/23 and has follow up scheduled on 04/24/24.

## 2024-03-12 NOTE — Telephone Encounter
 All Protocol Elements met  Medication name:    Name from pharmacy: OMEPRAZOLE  40MG          Will file in chart as: omeprazole  DR (PRILOSEC) 40 mg capsule    Sig: TAKE 1 CAPSULE BY MOUTH TWICE DAILY    Disp: 180 capsule    Refills: 0    Start: 03/12/2024    Class: Normal    To pharmacy: * * N O T I C E * * Last quantity doesn't match original quantity    Last ordered: 6 months ago (09/12/2023) by Donald LITTIE Flair, APRN-NP    Last refill: 01/18/2024    Rx #: [id number]    Gastroenterology: Acid Reflux Passed06/24/2025 03:50 PM   Protocol Details Valid encounter within last 12 months      To be filled at: [facility] [place], [place] - [address]         Patient was last seen on 11/20/23 and should be seen on or around 04/24/24.

## 2024-03-14 ENCOUNTER — Encounter: Admit: 2024-03-14 | Discharge: 2024-03-14 | Payer: MEDICARE

## 2024-03-14 NOTE — Telephone Encounter
 Patient calls requesting medication changes for PD. However, when returning call it goes straight to VM and no VM set up.

## 2024-03-18 ENCOUNTER — Encounter: Admit: 2024-03-18 | Discharge: 2024-03-18 | Payer: MEDICARE

## 2024-03-18 ENCOUNTER — Ambulatory Visit: Admit: 2024-03-18 | Discharge: 2024-03-19 | Payer: MEDICARE

## 2024-03-18 DIAGNOSIS — J479 Bronchiectasis, uncomplicated: Principal | ICD-10-CM

## 2024-03-18 DIAGNOSIS — R053 Chronic cough: Secondary | ICD-10-CM

## 2024-03-18 NOTE — Patient Instructions
 Clinic Visit Summary:     Today we discussed your cough in pulmonary clinic.  1) continue sinus rinses daily  2) continue trelegy 100 daily  3) continue 7% hypertonic saline nebulizer treatments with vest daily   4) you can continue albuterol  as needed    It was a pleasure to see you today, please don't hesitate to call with any future questions or concerns.      For questions or concerns,  please contact my Clinical Nurse Coordinator at [phone] or send a My Chart message.     Please contact my nurse with any signs and symptoms of worsening productive cough with thick secretions, blood in sputum, chest pain or tightness, shortness of breath, fever, chills, night sweats, or any other pulmonary concerns.     -  For URGENT issues after business hours, weekends, or holidays: Call [phone] and request for the Pulmonary Fellow to be paged.    -  For medication refills, please ask your pharmacy to send an electronic request.  Please allow at least 3 business days for medication refills.      -  For equipment issues, please contact your Durable Medical Equipment (DME) company directly.    -  To cancel, change, or schedule a clinic appointment: Call Pulmonary Scheduling at [phone].

## 2024-03-18 NOTE — Progress Notes
 Pulmonary Clinic Note    Date of Service: 03/18/2024  PCP:  Huntington, Melissa    Subjective      Johnny [name] is a 78 y.o. 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      Patient is previously followed at [facility] with multiple providers for chronic cough.  He has been started on hypertonic saline nebulizer treatments along with daily chest physiotherapy.  He currently has a percussion vest that also uses an brazil with his nebulizer treatments.  He reports that he has been experiencing chronic respiratory issues characterized by thick, gooey mucus production since 2012, following exposure to black mold.  Prior to that time he denies any significant pulmonary disease.  He is a never smoker.    They have a history of lung nodules, with the most recent CT scan in December 2024 showing a slight increase in size of one nodule from 6 to 7 mm. These nodules have been monitored for several years without significant change.    The patient has sinus issues, including MRSA infections, and uses a nightly sinus rinse with mupirocin, saline, and budesonide. They avoid ipratropium due to minor epistaxis if used more than three days consecutively.    Their medication regimen includes Trelegy 100 and an albuterol  sulfate inhaler, with two puffs taken at night. They have been on Trelegy for three to four years and experience a short-term coughing reaction from it. They have had flare-ups of their respiratory condition, often linked to sinus involvement, and have been treated with prednisone and levofloxacin during these episodes.    They have a history of hay fever and allergies, managed with Zyrtec  and occasionally Allegra.     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 Vocal cord injections have been suggested as a potential treatment.    They are also under the care of a Parkinson's specialist, who has recommended swallow studies and an esophagram, which are pending scheduling.          ROS  Review of Systems   Constitutional:  Negative for chills and fever.   HENT:  Positive for congestion. Negative for sinus pain.    Eyes:  Negative for blurred vision and double vision.   Respiratory:  Positive for cough, sputum production and shortness of breath. Negative for hemoptysis.    Cardiovascular:  Negative for chest pain, palpitations and leg swelling.   Gastrointestinal:  Negative for constipation, diarrhea, nausea and vomiting.   Genitourinary:  Negative for dysuria, frequency and urgency.   Musculoskeletal:  Negative for joint pain and myalgias.   Skin:  Negative for itching and rash.   Neurological:  Positive for tremors.        Objective:          acetaminophen  (TYLENOL ) 325 mg tablet Take two tablets by mouth every 4 hours as needed for Pain.    albuterol  (VENTOLIN  HFA, PROAIR  HFA) 90 mcg/actuation inhaler Inhale two puffs by mouth into the lungs four times daily as needed for Wheezing.    alpha lipoic acid 600 mg capsule Take one capsule by mouth daily.    aspirin  81 mg chewable tablet Chew one tablet by mouth at bedtime daily.    carbidopa -levodopa  (SINEMET ) 25-100 mg tablet Take two tablets by mouth three times daily.    cephalexin  (KEFLEX )  500 mg capsule Take one capsule by mouth four times daily.    cetirizine  (ZYRTEC ) 10 mg tablet Take one tablet by mouth daily.    cholecalciferol (Vitamin D3) (VITAMIN D-3) 1,000 units tablet Take one tablet by mouth twice daily.      coenzyme Q10 200 mg capsule Take one capsule by mouth daily.    cyanocobalamin (vitamin B-12) 500 mcg tablet Take one tablet by mouth daily.    cyclobenzaprine (FLEXERIL) 5 mg tablet Take one tablet by mouth three times daily.    Docusate Sodium  250 mg cap Take 250mg  in the am and 500mg  in the evening    ECHINACEA PO Take 1 capsule by mouth daily.    ezetimibe  (ZETIA ) 10 mg tablet TAKE 1 TABLET BY MOUTH AT BEDTIME    famotidine  (PEPCID ) 40 mg tablet TAKE 1 TABLET BY MOUTH EVERY DAY    fesoterodine  ER (TOVIAZ ) 4 mg tablet Take one tablet by mouth daily. Do not cut/ crush/ chew    fexofenadine(+) (ALLEGRA) 180 mg tablet Take one tablet by mouth daily.    fluticasone -umeclidin-vilanter (TRELEGY ELLIPTA) 100-62.5-25 mcg inhaler Inhale 1 Dose by mouth into the lungs daily after dinner.    furosemide  (LASIX ) 40 mg tablet Take 2 tabs on Mon-Wed-Fri  Take 3 tabs on Sun-Tues-Thur-Sat    gabapentin  (NEURONTIN ) 300 mg capsule TAKE 1 CAPSULE BY MOUTH THREE TIMES DAILY (Patient taking differently: Take one capsule by mouth four times daily.)    glucosamine su 2KCl-chondroit 500-400 mg tab Take 1 Tab by mouth twice daily.    guaiFENesin LA (MUCINEX) 600 mg tablet Take one tablet by mouth twice daily.    ipratropium bromide  (ATROVENT ) 42 mcg (0.06 %) nasal spray Apply two sprays to each nostril as directed twice daily as needed.    ketoconazole (NIZORAL) 2 % topical shampoo Apply  topically to affected area every 7 days. Apply topically to affected area of damp skin, lather, leave on 5 minutes, and rinse.    losartan  (COZAAR ) 50 mg tablet TAKE 1 TABLET BY MOUTH EVERY DAY    methocarbamoL  (ROBAXIN ) 500 mg tablet Take one tablet by mouth three times daily as needed for Spasms.    metoprolol  succinate XL (TOPROL  XL) 100 mg extended release tablet TAKE 1 TABLET BY MOUTH EVERY DAY    mucus clearing device (AEROBIKA OSCILLATING PEP SYSTM MISC) Use  as directed. Use with salt water  and inhale twice daily    OMEGA-3 FATTY ACIDS-FISH OIL PO Take 1 capsule by mouth twice daily.    omeprazole  DR (PRILOSEC) 40 mg capsule TAKE 1 CAPSULE BY MOUTH TWICE DAILY    other medication one Dose. Mupirocin dissolved in warm water  and Budesonide in NS  qhs  (sometimes increases to BID)    polyethylene glycol 3350  (MIRALAX ) 17 g packet Take one packet by mouth daily as needed.    potassium chloride  SR (K-DUR) 20 mEq tablet TAKE 2 TABLETS BY MOUTH ON MONDAY, WEDNESDAY AND FRIDAY AND 1 TABLET ALL OTHER DAYS    predniSONE (DELTASONE) 5 mg tablet     pyRIDostigmine bromide (MESTINON) 60 mg tablet Take two tablets by mouth three times daily.      rosuvastatin  (CRESTOR ) 40 mg tablet TAKE 1 TABLET BY MOUTH AT BEDTIME    spironolactone  (ALDACTONE ) 25 mg tablet TAKE 1 TABLET BY MOUTH EVERY DAY WITH FOOD    tamsulosin  (FLOMAX ) 0.4 mg capsule Take one capsule by mouth daily.    testosterone cypionate 200 mg/mL kit Inject 1 mL into  the muscle every 14 days.    traMADoL  (ULTRAM ) 50 mg tablet Take one tablet by mouth every 8 hours as needed for Pain.    traZODone (DESYREL) 100 mg tablet Take one tablet by mouth at bedtime daily.    Vacuum Erection Device System kit Use as directed for sexual activity.    warfarin (COUMADIN ) 3 mg tablet Take one tablet by mouth at bedtime daily.     Vitals:    03/18/24 0822   BP: 116/58   BP Source: Arm, Right Upper   Pulse: 95   Temp: 36.6 ?C (97.8 ?F)   Resp: 18   SpO2: 100%   TempSrc: Oral   PainSc: Zero   Weight: 99.8 kg (220 lb)   Height: 177.8 cm (5' 10)     Body mass index is 31.57 kg/m?SABRA     Physical Exam  Vitals reviewed.   Constitutional:       General: He is not in acute distress.  HENT:      Head: Normocephalic and atraumatic.      Right Ear: External ear normal.      Left Ear: External ear normal.      Mouth/Throat:      Mouth: Mucous membranes are moist.      Pharynx: Oropharynx is clear.   Eyes:      Extraocular Movements: Extraocular movements intact.      Pupils: Pupils are equal, round, and reactive to light.   Cardiovascular:      Rate and Rhythm: Normal rate.      Heart sounds: No murmur heard.     No friction rub. No gallop.   Pulmonary:      Effort: Pulmonary effort is normal.      Breath sounds: No stridor. No wheezing or rales.   Abdominal:      General: Abdomen is flat. Bowel sounds are normal. There is no distension. Palpations: Abdomen is soft.      Tenderness: There is no abdominal tenderness.   Musculoskeletal:      Cervical back: Normal range of motion and neck supple.      Right lower leg: No edema.      Left lower leg: No edema.   Skin:     General: Skin is warm and dry.      Findings: No rash.   Neurological:      General: No focal deficit present.      Mental Status: He is alert and oriented to person, place, and time.      Cranial Nerves: No cranial nerve deficit (grossly).                  Assessment and Plan:  Bronchiectasis: Clinical diagnosis based off records from [facility].  PFT's 03/06/24 without obstruction but did show mild restrictive defect (TLC 72%).  CXR 03/13/24 without parenchymal infiltrates.  Last CT chest 09/18/2023 does not report fibrotic or bronchiectatic disease, do not have images for my personal verification.  Uses a percussion vest every night along with 7% HTS nebulizer treatments.  He makes his own HTS nebulizer treatments.    - continue 7% HTS nebs with vest therapy nightly   - continue Trelegy 100 daily   - continue 2 puffs albuterol  at night   - will get images pushed over from [facility] system     Restrictive Lung Disease  PFT's 03/06/24 without obstruction but did show mild restrictive defect (TLC 72%), normal diffusion capacity.  No documented  history of fibrotic disease on previous cross-sectional imaging of the chest however request images to be pushed into our system for formal confirmation.  Would presume his restriction is related to body habitus    Multiple Lung Nodules: Last LDCT 09/18/23 with stable subcentimeter pulmonary nodules which have largely been stable since 2019.  There was debate whether a couple of the nodules have increased by about 1 mm since 2019.  We discussed that even if these were to represent slow-growing malignant process, the risk of lung biopsy likely outweighs the benefit given their relative stability for the past 5 years.  Will defer follow-up imaging for this time but can continue discussed with patient going forward    Recurrent Sinusitis: Reports a history of recurrent sinusitis which will often trigger a chest cold.  Follows with Dr Johnie in [place], uses sinus rinses nightly, dissolves mupiricin ointment in water  along with 1% saline and a capsule of budesonide.    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  He will discuss this further with his ENT provider    Follow up in 6 months or sooner as needed.        Carliss Beal, MD  Pulmonary and Critical Care Medicine

## 2024-03-19 ENCOUNTER — Encounter: Admit: 2024-03-19 | Discharge: 2024-03-19 | Payer: MEDICARE

## 2024-03-19 NOTE — Telephone Encounter
 Received incoming VM from pt's wife, Lolita. She was calling about pt. She reports he was told he needs to have an injection in his vocal cords, and were recommended to a different GI provider that only does high risk GI. She reported they tried to make an appointment with this other doctor, but was told something needed to be done on the clinic side to allow this.   He has an appointment on 8/6, but didn't want to wait that long to see a different GI provider. Would like a call back.    Outbound call to pt. Pt has been seeing Dr. Rayma at [facility] in [place]. The last time he was seen in [place] with the speech therapist they recommended he get vocal chord injections to help make them firmer to work better.   Let pt know that this RN did not think that was something we did in GI, and he may need to see an ENT. He reported he has an ENT provider. He reported the provider was open to the idea of vocal chord injections, but maybe was not confident to do it. He is open to possibly seeing an ENT at [facility] and wanted to know if there were any recommendations.   Pt has appointment on 8/6 with GI APP and will keep appointment for GI needs.     Routing to Dr. Leonce

## 2024-04-09 ENCOUNTER — Encounter: Admit: 2024-04-09 | Discharge: 2024-04-09 | Payer: MEDICARE

## 2024-04-09 NOTE — Telephone Encounter
 Pt states that the cyclobenzaprine is helping more than the tizanidine  but asks about something stronger.     Pt denies any side effects from the cyclobenzaprine 5 mg. Review can increase to 10 mg at bedtime to see how he does. If it still doesn't help enough can ask Dr. Lazarus about switching to a different medication like methocarbamol . Methocarbamol  will likely require a PA with Medicare.

## 2024-04-12 ENCOUNTER — Encounter: Admit: 2024-04-12 | Discharge: 2024-04-12 | Payer: MEDICARE

## 2024-04-15 ENCOUNTER — Encounter: Admit: 2024-04-15 | Discharge: 2024-04-15 | Payer: MEDICARE

## 2024-04-15 ENCOUNTER — Ambulatory Visit: Admit: 2024-04-15 | Discharge: 2024-04-15 | Payer: MEDICARE

## 2024-04-15 ENCOUNTER — Ambulatory Visit: Admit: 2024-04-15 | Discharge: 2024-04-16 | Payer: MEDICARE

## 2024-04-15 DIAGNOSIS — J383 Other diseases of vocal cords: Principal | ICD-10-CM

## 2024-04-15 DIAGNOSIS — G7000 Myasthenia gravis without (acute) exacerbation: Secondary | ICD-10-CM

## 2024-04-15 DIAGNOSIS — R498 Other voice and resonance disorders: Secondary | ICD-10-CM

## 2024-04-15 DIAGNOSIS — R1310 Dysphagia, unspecified: Secondary | ICD-10-CM

## 2024-04-15 DIAGNOSIS — R490 Dysphonia: Principal | ICD-10-CM

## 2024-04-15 DIAGNOSIS — G20A1 Parkinson's disease, unspecified whether dyskinesia present, unspecified whether manifestations fluctuate (CMS-HCC): Secondary | ICD-10-CM

## 2024-04-15 NOTE — Progress Notes
 [facility]  VOICE CENTER  Videostroboscopy [phone])  Staff: Dr. Chiquita Ship, M.D.  Date: 04/15/2024       S  Johnny [name] was referred for videostroboscopy with a diagnosis of dysphonia. The patient has been experiencing hoarseness for a couple of years. His past medical history is significant for ocular myasthenia gravis and PD.  The evaluation aims to assess laryngeal function and provide recommendations.    O  EXAM DETAIL  Adduction     Right Normal   Left  Normal   Abduction    Right Normal   Left Normal   Vibratory Edge    Right Bowed, with prominent vocal processes   Left Bowed, with prominent vocal processes   Mucosal Wave    Right  intact   Left  intact   Amplitude    Right intact   Left intact   Glottal Closure Incomplete - this improved with cues for increased airflow   Phase Closure Open   Periodicity Largely periodic   Phase Symmetry Largely symmetric   MTD Type II, Type III, and Mild-Moderate   Ventricular Compression Even   Vertical Level of Approximation Left Lower   Other Findings N/A       Still Images:   Please find still images attached to the videostroboscopy order in the Procedures section of patient's chart.     A  Prior to this exam, Neosynephrine and 4% Lidocaine  was administered to the patient's Right nare by Dr. Chiquita Ship.     No overt masses or lesions were noted. Adequate range of motion of the bilateral arytenoid complexes was appreciated. The bilateral vocal fold edges appeared bowed, with prominent vocal processes. During phonation, these vocal processes scissored. There was incomplete glottic closure; however, this improved with cues for increased airflow.  Supraglottic hyperfunction was appreciated, namely mild-moderate MTD type II/III. Tremulous motion of the larynx was appreciated with phonation    EXAM SUMMARY  Examiner Damien Shed, MM, MS, CCC-SLP   Diagnostician Dr. Chiquita Ship   Primary Diagnosis Vocal cord bowing   Secondary Diagnosis Vocal tremor   Tertiary Diagnosis N/A   Vocal Quality Mildly breathy/moderately strained/mild-moderately rough, with intermittent aphonic pitch breaks   Breath Support Needs improvement   Resonance Normal   Side of Nose Scope was Passed Right   Original Pt Complaint Hoarseness     P  Speech pathology voice evaluation and therapy is recommended to address coordination of respiration and phonation. Please follow up with Dr. Chiquita Ship as directed.    The ENT speech pathology service plans to continue to follow this patient in the future for any potential/ongoing outpatient voice, swallowing, and/or speech related care, as appropriate.  Please note: the patient may be referred to outside services, should the patient's plan of care require skills outside of the ENT speech pathologist team's specialty.

## 2024-04-15 NOTE — Progress Notes
 Chief Complaint   Patient presents with    New Patient      Difficulty speaking Dysphagia, unspecified type Followed DT           History of Present Illness:   Johnny [name] is a 78 y.o. year old male evaluated on 04/15/2024, in the Otolaryngology-Head and Neck Surgery Clinic at the [facility]. The patient was referred by Dr. Leonce for evaluation of dysphonia. History is obtained from the patient. I ordered and interpreted stroboscopy for this visit.     Chief complaint of dysphonia. This started over the last several years. Voice is raspy/breathy and will sometimes lose his voice. He was seen by SLP at [facility] and injection augmentation was recommended. He has not done LSVT.    He reports difficulties with swallowing.  Specific difficulties include occasional coughing with eating. Pills sticking. No aspiration pneumonia or weight loss. He reports frank reflux symptoms such as retrosternal burning/pain after meals, regurgitation or a sour brash sensation in the back of the throat.  He does have a known hiatal hernia.  He is currently taking reflux medications.     He reports difficulty breathing. He had COPD.       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     VOCAL DEMANDS/HYGIENE: . He  reports that he has never smoked. He has never used smokeless tobacco. He reports that he does not currently use alcohol. He reports that he does not use drugs.    Past Medical/Surgical History  He  has a past medical history of Abnormal involuntary movement (1994), ADHD (attention deficit hyperactivity disorder), Atrial fibrillation (CMS-HCC) (02/08/2009), Back pain, Bleeding disorder (when started Xarelto ), Bronchiectasis (CMS-HCC), CAD (coronary artery disease) (02/08/2009), Cardiac dysrhythmia (2007), Carpal tunnel syndrome, Cataract (2017), Chronic bronchitis (CMS-HCC), Chronic cough, Congestive heart disease (CMS-HCC), Constipation (2015), COPD (chronic obstructive pulmonary disease) (CMS-HCC), Degenerative disc disease, lumbar (2010), Erectile dysfunction, vasculogenic, Fracture, GERD (gastroesophageal reflux disease), Hearing reduced (2015), Heart attack (CMS-HCC) (possible in 2006), Hiatal hernia, anticoagulation, Hyperlipemia (02/11/2009), Hypertension (02/11/2009), 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), 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   Surgical History:   Procedure Laterality Date    HUMERUS FRACTURE SURGERY Left 2004    MITRAL VALVULOPLASTY  2006    same time as bypass    CORONARY ARTERY BYPASS GRAFT  04/14/2005    CABGx5 LIMA-LAD, L Rad-Dx, sSVG-RCA-RCA, sSVG-OM & MV Plasty for MR:    RHYTHM DEVICE PLACEMENT  2007    defibrillator    FOOT SURGERY Bilateral 2012    Tarsal tunnel release w/ bunionectomy and hammer toe     EAR SURGERY Bilateral 2012    Ear Tubes    SINUS SURGERY  10/2011    CARDIOVASCULAR STRESS TEST  09/2013    KNEE REPLACEMENT Right 05/2014    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    Performed by Lenon Alm ORN, MD at [facility] OR    TRANSVENOUS REMOVAL IMPLANTABLE DEFIBRILLATOR RIGHT VENTRICULAR LEAD Left 03/13/2018    Performed by Laurian Armin BROCKS, MD at  [facility] CVOR    INSERTION RIGHT VENTRICULAR LEAD Left 03/13/2018    Performed by Laurian Armin BROCKS, MD at [facility] CVOR    FLUOROSCOPY - CARDIAC  03/13/2018    Performed by Laurian Armin BROCKS, MD at [facility] CVOR    INSERTION/ REPLACEMENT TEMPORARY PACEMAKER LEAD/ CATHETER  03/13/2018    Performed by Laurian Armin BROCKS, MD at [facility] CVOR    REMOVAL AND REPLACEMENT IMPLANTABLE DEFIBRILLATOR GENERATOR - DUAL LEAD SYSTEM  03/13/2018    Performed by Laurian Armin BROCKS, MD at [facility] CVOR    CARDIOTHORACIC SURGERY STANDBY N/A 03/13/2018    Performed by Cath, Physician at [facility] CVOR    INTRACARDIAC CATHETER ABLATION WITH COMPREHENSIVE ELECTROPHYSIOLOGIC EVALUATION - ATRIAL FIBRILLATION, RADIOFREQUENCY N/A 11/22/2018    Performed by Laurian Armin BROCKS, MD at [facility] EP LAB    INTRACARDIAC ELECTROPHYSIOLOGIC 3-DIMENSIONAL MAPPING N/A 11/22/2018    Performed by Laurian Armin BROCKS, MD at [facility] EP LAB    INTRACARDIAC ECHOCARDIOGRAPHY N/A 11/22/2018    Performed by Laurian Armin BROCKS, MD at [facility] EP LAB    POSSIBLE INTRAVENOUS DRUG INFUSION FOR STIMULATION AND PACING N/A 11/22/2018    Performed by Laurian Armin BROCKS, MD at [facility] EP LAB    POSSIBLE INSERTION ESOPHAGEAL DEVIATOR N/A 11/22/2018    Performed by Laurian Armin BROCKS, MD at [facility] EP LAB    POSSIBLE EXTERNAL CARDIOVERSION N/A 11/22/2018    Performed by Laurian Armin BROCKS, MD at [facility] EP LAB    INTRACARDIAC CATHETER ABLATION WITH COMPREHENSIVE ELECTROPHYSIOLOGIC EVALUATION - ATYPICAL FLUTTER Right 11/22/2018    Performed by Laurian Armin BROCKS, MD at [facility] EP LAB    TRANSESOPHAGEAL ECHOCARDIOGRAM DURING INTERVENTION N/A 11/22/2018    Performed by Cath, Physician at [facility] EP LAB    INSERTION SPINAL NEUROSTIMULATOR PULSE GENERATOR/ RECEIVER N/A 11/17/2021    Performed by Lazarus Colace, MD at [facility] OR    PERCUTANEOUS IMPLANTATION EPIDURAL NEUROSTIMULATOR ELECTRODE ARRAY x2 N/A 11/17/2021    Performed by Lazarus Colace, MD at [facility] OR    ELECTRONIC ANALYSIS IMPLANTED NEUROSTIMULATOR PULSE GENERATOR SYSTEM - COMPLEX SPINAL CORD/ PERIPHERAL NEUROSTIMULATOR PULSE GENERATOR/ TRANSMITTER WITH INTRA OPERATIVE/ SUBSEQUENT PROGRAMING N/A 11/17/2021    Performed by Lazarus Colace, MD at [facility] OR    SIGMOIDOSCOPY DIAGNOSTIC WITH COLLECTION SPECIMEN BY BRUSHING/ WASHING - FLEXIBLE N/A 09/30/2022    Performed by Ledora Catalina, MD at [facility] ENDO    ESOPHAGOGASTRODUODENOSCOPY WITH SPECIMEN COLLECTION BY BRUSHING/ WASHING N/A 09/30/2022    Performed by Ledora Catalina, MD at [facility] ENDO    EXAM ANORECTAL UNDER ANESTHESIA, HEMMORHOIDECTOMY, EXCISION OF ANAL LESION N/A 02/27/2023    Performed by Rowland Jayson SAUNDERS, MD at [facility] OR    . N/A 02/27/2023    Performed by Rowland Jayson SAUNDERS, MD at [facility] OR    . N/A 02/27/2023    Performed by Rowland Jayson SAUNDERS, MD at [facility] OR    *Nalu PNS* INSERTION/ REPLACEMENT PERIPHERAL/ SACRAL/ GASTRIC NEUROSTIMULATOR PULSE GENERATOR/ RECEIVER/ REQUIRING POCKET CREATION/ CONNECTION ELECTRODE GENERATOR/ RECEIVER Right 07/19/2023    Performed by Lazarus Colace, MD at [facility] OR    PERCUTANEOUS IMPLANTATION NEUROSTIMULATOR ELECTRODE ARRAY - PERIPHERAL NERVE N/A 07/19/2023    Performed by Lazarus Colace, MD at [facility] OR    ELECTRONIC ANALYSIS IMPLANTED NEUROSTIMULATOR PULSE GENERATOR SYSTEM - COMPLEX SPINAL CORD/ PERIPHERAL NEUROSTIMULATOR PULSE GENERATOR/ TRANSMITTER WITH INTRA OPERATIVE/ SUBSEQUENT PROGRAMING N/A 07/19/2023    Performed by Lazarus Colace, MD at [facility] OR    ULTRASOUND GUIDANCE FOR NEEDLE PLACEMENT N/A 07/19/2023  Performed by Lazarus Colace, MD at [facility] OR    ESOPHAGEAL FUNCTION/ GASTROESOPHAGEAL REFLUX TEST WITH NASAL CATHETERD N/A 11/27/2023    Performed by Ronalee Pacific, MD at [facility] ENDO    ESOPHAGOGASTRODUODENOSCOPY WITH BIOPSY - FLEXIBLE N/A 12/07/2023    Performed by Leonce Emerick PARAS, DO at [facility] ENDO    ARTERIOGRAM CAROTID BILAT  2006    BRONCHOSCOPY  probably 2018    done at [facility]    CARDIAC CATHERIZATION  2006    CARDIAC DEFIBRILLATOR PLACEMENT  11/2013, 02-2018    S/P Fidelis ICD Lead Extraction and New Lead Implantation    CARPAL TUNNEL RELEASE  2000    ECHOCARDIOGRAM PROCEDURE      HX BACK SURGERY  2013, 2006    Lumbart Laminectomy and Fusion    HX CARPAL TUNNEL RELEASE Right 1990s    HX CORONARY ARTERY BYPASS GRAFT  2006    HX EAR TUBES  2012    HX ENDOSCOPY  2019    HX HEART CATHETERIZATION      HX HEMORRHOIDECTOMY  2021, 2022    HX PACEMAKER PLACEMENT  2007    HX TONSILLECTOMY  1954    KNEE SURGERY  2015, 2016, 2020    SEPTOPLASTY      ?    SURGERY  2010    UMBILICAL ARTERIAL CATH - BEDSIDE  2006    UPPER GASTROINTESTINAL ENDOSCOPY        Past surgical history reviewed and is otherwise noncontributory.       Past Family/Social History  Family History   Problem Relation Name Age of Onset    Cancer Father Josias     Heart problem Father Watt     Heart Disease Father Curry     Hypertension Mother Heron     Joint Pain Mother Heron     Cancer Mother Heron     Cancer Sister Lindwood     Arthritis Mother Tyresse Jayson    Allergy-severe Mother Heron     Diabetes Maternal Grandmother Ava         72    Family history reviewed and is otherwise noncontributory.       Medications/Allergies/Immunizations  His current medication(s) include:   Current Outpatient Medications   Medication Sig Dispense Refill    albuterol  (VENTOLIN  HFA, PROAIR  HFA) 90 mcg/actuation inhaler Inhale two puffs by mouth into the lungs four times daily as needed for Wheezing.      aspirin  81 mg chewable tablet Chew one tablet by mouth at bedtime daily.      carbidopa -levodopa  (SINEMET ) 25-100 mg tablet Take two tablets by mouth three times daily. 540 tablet 3    cetirizine  (ZYRTEC ) 10 mg tablet Take one tablet by mouth daily.      cholecalciferol (Vitamin D3) (VITAMIN D-3) 1,000 units tablet Take one tablet by mouth twice daily.        coenzyme Q10 200 mg capsule Take one capsule by mouth daily.      cyanocobalamin (vitamin B-12) 500 mcg tablet Take one tablet by mouth daily.      cyclobenzaprine (FLEXERIL) 5 mg tablet May take one tablet by mouth twice daily as needed. May also take two tablets at bedtime as needed. 120 tablet 3    ECHINACEA PO Take 1 capsule by mouth daily.      ezetimibe  (ZETIA ) 10 mg tablet TAKE 1 TABLET BY MOUTH AT BEDTIME 90 tablet 3    famotidine  (  PEPCID ) 40 mg tablet TAKE 1 TABLET BY MOUTH EVERY DAY 90 tablet 0    fesoterodine  ER (TOVIAZ ) 4 mg tablet Take one tablet by mouth daily. Do not cut/ crush/ chew 90 tablet 0    fluticasone -umeclidin-vilanter (TRELEGY ELLIPTA) 100-62.5-25 mcg inhaler Inhale 1 Dose by mouth into the lungs daily after dinner.      furosemide  (LASIX ) 40 mg tablet Take 2 tabs on Mon-Wed-Fri  Take 3 tabs on Sun-Tues-Thur-Sat 226 tablet 2    gabapentin  (NEURONTIN ) 300 mg capsule TAKE 1 CAPSULE BY MOUTH THREE TIMES DAILY (Patient taking differently: Take one capsule by mouth four times daily.) 270 capsule 3    glucosamine su 2KCl-chondroit 500-400 mg tab Take 1 Tab by mouth twice daily.      ipratropium bromide  (ATROVENT ) 42 mcg (0.06 %) nasal spray Apply two sprays to each nostril as directed twice daily as needed.      ketoconazole (NIZORAL) 2 % topical shampoo Apply  topically to affected area every 7 days. Apply topically to affected area of damp skin, lather, leave on 5 minutes, and rinse.      losartan  (COZAAR ) 50 mg tablet TAKE 1 TABLET BY MOUTH EVERY DAY 90 tablet 3    metoprolol  succinate XL (TOPROL  XL) 100 mg extended release tablet TAKE 1 TABLET BY MOUTH EVERY DAY 90 tablet 3    mucus clearing device (AEROBIKA OSCILLATING PEP SYSTM MISC) Use  as directed. Use with salt water  and inhale twice daily      omeprazole  DR (PRILOSEC) 40 mg capsule TAKE 1 CAPSULE BY MOUTH TWICE DAILY 180 capsule 0    other medication one Dose. Mupirocin dissolved in warm water  and Budesonide in NS  qhs  (sometimes increases to BID)      polyethylene glycol 3350  (MIRALAX ) 17 g packet Take one packet by mouth daily as needed.      potassium chloride  SR (K-DUR) 20 mEq tablet TAKE 2 TABLETS BY MOUTH ON MONDAY, WEDNESDAY AND FRIDAY AND 1 TABLET ALL OTHER DAYS 140 tablet 2    predniSONE (DELTASONE) 5 mg tablet       pyRIDostigmine bromide (MESTINON) 60 mg tablet Take two tablets by mouth three times daily.        rosuvastatin  (CRESTOR ) 40 mg tablet TAKE 1 TABLET BY MOUTH AT BEDTIME 90 tablet 3    spironolactone  (ALDACTONE ) 25 mg tablet TAKE 1 TABLET BY MOUTH EVERY DAY WITH FOOD 90 tablet 3    tamsulosin  (FLOMAX ) 0.4 mg capsule Take one capsule by mouth daily.      testosterone cypionate 200 mg/mL kit Inject 1 mL into the muscle every 14 days.      traZODone (DESYREL) 100 mg tablet Take one tablet by mouth at bedtime daily.      Vacuum Erection Device System kit Use as directed for sexual activity. 1 kit 11    warfarin (COUMADIN ) 3 mg tablet Take one tablet by mouth at bedtime daily.       No current facility-administered medications for this visit.       Allergies: Clarithromycin, Ketoconazole, Lisinopril, and Oxycodone        PHYSICAL EXAM:   Vital Signs:  BP (!) 147/85 (BP Source: Arm, Right Lower, Patient Position: Sitting)  - Pulse 88  - Temp 36.8 ?C (98.2 ?F) (Temporal)  - Ht 180.3 cm (5' 11)  - Wt 98.9 kg (218 lb)  - SpO2 98%  - BMI 30.40 kg/m?     Neurologic: Alert and oriented x 3.  Cranial nerves II - XII grossly intact and symmetric.   Eyes:  Pupils equal round and reactive.       Extraocular eye movements intact and full.  Ears:  Pinna appear normal.  Hearing intact to voice.  Nose:  Dorsum straight.  Nares patent.    No mucopurulent discharge or polyps.  Oral Cavity: Lips without cutaneous lesion.  Oral competence intact.    Dentition is in fair repair.  Oral mucosa pink and moist.    No masses or lesions.     Tongue mobile.  Floor of mouth without lesion; no torus mandibularis present.    Oropharynx: No mucosal masses or lesions.  Tonsils without lesion.     Soft palate elevates symmetrically.  Neck:  Soft, non-tender.  No appreciable lymphadenopathy.    Trachea midline.  Laryngeal landmarks palpable.    Larynx elevates with swallowing.  No perihyoid tenderness.     Thyroid without mass or tenderness.  Respiratory: Breathing comfortably.  No suprasternal/suprclavicular retractions    or accessory muscle use.  No stridor.  Psychiatric: Normal mood and affect.  Behavior normal.      VOICE EVALUATION:  Voice is graded G2 R1 B1 A2 S0.  Pitch is unchanged for stated gender.  Volume is decreased.  Glottal fry is not appreciated. Tremor is not noted.  Voice breaks are not appreciated.  Articulation is normal.  Resonance is normal.       Procedure: STROBOSCOPY:  I have ordered, reviewed, and interpreted the videostroboscopy performed by Damien Shed SLP.  A full, detailed report with images will follow and can be found under Procedures.  Pertinent findings are as follows:                                                  Nasal Cavity:  Unremarkable          Nasopharynx:  Unremarkable         Oropharynx:  Unremarkable           Hypopharynx:  Unremarkable           Supraglottis:  Unremarkable           Glottic appearance: No lesions; atrophy bilaterally       Vocal Fold Motion: Full bilaterally         Laryngeal Closure: incomplete with spindle shaped gap         Mucosal wave amplitude: Preserved         Phase symmetry: Mild asymmetry         Supraglottic compression: Moderate to severe         Peri-arytenoid/Interarytenoid edema/erythema: Mild         Glottic secretions: Unremarkable        Subglottis: Patent      Doc flowsheets - 04/15/2024  VHI-10: (Proxy-Rptd) 18  EAT-10: (Proxy-Rptd) 6  DI: (Proxy-Rptd) 9  CSI: (Proxy-Rptd) 5            IMPRESSION:   My impression is that Mr. Cortright has:  Parkinsons Disease  Myasthenia Gravis (seronegative, ocular)  Dyphonia  Dysphagia       PLAN:     Exam findings are reviewed with the patient. There is not evidence of mass, lesion, or paralysis. There is atrophy bilaterally.      MBS with esophagram to evaluate swallow.  Recommend voice therapy for resonant voice and improving breath support. Given his MG, I do not recommend LSVT.     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 He is interested in this. Will schedule. He is on warfarin -- ok to continue. Risks include pain, infection, bleeding, scarring, injury to lips/teeth/tongue/gums, worsened voice/breathing/swallow, vocal cord scar, recurrence of disease, need for additional treatment.     I believe that Mr. Halterman has a good understanding of the issues involved and I answered all of his questions to the best of my ability.       Chiquita Ship, M.D.  Assistant Professor, Laryngology  Department of Otolaryngology-Head and Neck Surgery    I saw this patient with a resident.

## 2024-04-16 ENCOUNTER — Encounter: Admit: 2024-04-16 | Discharge: 2024-04-16 | Payer: MEDICARE

## 2024-04-24 ENCOUNTER — Encounter: Admit: 2024-04-24 | Discharge: 2024-04-24 | Payer: MEDICARE

## 2024-04-24 ENCOUNTER — Ambulatory Visit: Admit: 2024-04-24 | Discharge: 2024-04-24 | Payer: MEDICARE

## 2024-04-24 ENCOUNTER — Ambulatory Visit: Admit: 2024-04-24 | Discharge: 2024-04-25 | Payer: MEDICARE

## 2024-04-26 ENCOUNTER — Encounter: Admit: 2024-04-26 | Discharge: 2024-04-26 | Payer: MEDICARE

## 2024-04-29 ENCOUNTER — Encounter: Admit: 2024-04-29 | Discharge: 2024-04-29 | Payer: MEDICARE

## 2024-04-29 ENCOUNTER — Ambulatory Visit: Admit: 2024-04-29 | Discharge: 2024-04-30 | Payer: MEDICARE

## 2024-04-29 DIAGNOSIS — G4733 Obstructive sleep apnea (adult) (pediatric): Principal | ICD-10-CM

## 2024-04-29 DIAGNOSIS — G4752 REM sleep behavior disorder: Secondary | ICD-10-CM

## 2024-04-29 NOTE — Progress Notes
 Date of Service: 04/29/2024    Subjective:             Johnny [name] is a 78 y.o. male. He is accompanied by his wife.     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    Interval History    He continues to follow with Dr. Jama in the movement disorders clinic for PD and with Dr. Arty in the neuromuscular clinic for MG.     He likes the larger size mask. Continues to have benefit from PAP therapy.   He denies any issues with CPAP.   Wife denies any snoring with CPAP.  Uses machine even with travel. He sleeps better with CPAP.   Occasional dry mouth, not bothersome. Wife notices slight mouth breathing. He loathes the chin strap. Wearing nasal style mask. Hasn't tried full face mask.   Feels more alert during the day.     Wife has not witnessed any dream enactment behavior in the past 6 months.   He is taking melatonin 5 mg at bedtime. Hasn't noticed that the higher dose is more effective than the lower dose.           04/29/2024     9:55 AM 04/26/2023    11:20 AM   Epworth Sleepiness Scale   Sitting and reading 1 1    Watching TV 1  0    Sitting inactive in a public place (e.g. a theater or a meeting) 1  0    As a passenger in a car for an hour without a break 1  1    Lying down to rest in the afternoon when circumstances permit 3  3    Sitting and talking to someone 0  0    Sitting quietly after a lunch without alcohol 2  1    In a car, while stopped for a few minutes in traffic 0  0    Epworth Sleepiness Scale Score 9 6       Proxy-reported   He denies drowsy driving.            Objective:         albuterol  (VENTOLIN  HFA, PROAIR  HFA) 90 mcg/actuation inhaler Inhale two puffs by mouth into the lungs four times daily as needed for Wheezing.    aspirin  81 mg chewable tablet Chew one tablet by mouth at bedtime daily.    carbidopa -levodopa  (SINEMET ) 25-100 mg tablet Take two tablets by mouth three times daily.    cetirizine  (ZYRTEC ) 10 mg tablet Take one tablet by mouth daily. cholecalciferol (Vitamin D3) (VITAMIN D-3) 1,000 units tablet Take one tablet by mouth twice daily.      coenzyme Q10 200 mg capsule Take one capsule by mouth daily.    cyanocobalamin (vitamin B-12) 500 mcg tablet Take one tablet by mouth daily.    cyclobenzaprine (FLEXERIL) 5 mg tablet May take one tablet by mouth twice daily as needed. May also take two tablets at bedtime as needed.    ECHINACEA PO Take 1 capsule by mouth daily.    ezetimibe  (ZETIA ) 10 mg tablet TAKE 1 TABLET BY MOUTH AT BEDTIME    famotidine  (PEPCID ) 40 mg tablet TAKE 1 TABLET BY MOUTH EVERY DAY    fesoterodine  ER (TOVIAZ ) 4 mg tablet Take one tablet by mouth daily. Do not cut/ crush/ chew    fluticasone -umeclidin-vilanter (TRELEGY ELLIPTA) 100-62.5-25 mcg inhaler Inhale 1 Dose by mouth into the  lungs daily after dinner.    furosemide  (LASIX ) 40 mg tablet Take 2 tabs on Mon-Wed-Fri  Take 3 tabs on Sun-Tues-Thur-Sat    gabapentin  (NEURONTIN ) 300 mg capsule TAKE 1 CAPSULE BY MOUTH THREE TIMES DAILY (Patient taking differently: Take one capsule by mouth four times daily.)    glucosamine su 2KCl-chondroit 500-400 mg tab Take 1 Tab by mouth twice daily.    ipratropium bromide  (ATROVENT ) 42 mcg (0.06 %) nasal spray Apply two sprays to each nostril as directed twice daily as needed.    ketoconazole (NIZORAL) 2 % topical shampoo Apply  topically to affected area every 7 days. Apply topically to affected area of damp skin, lather, leave on 5 minutes, and rinse.    losartan  (COZAAR ) 50 mg tablet TAKE 1 TABLET BY MOUTH EVERY DAY    metoprolol  succinate XL (TOPROL  XL) 100 mg extended release tablet TAKE 1 TABLET BY MOUTH EVERY DAY    mucus clearing device (AEROBIKA OSCILLATING PEP SYSTM MISC) Use  as directed. Use with salt water  and inhale twice daily    omeprazole  DR (PRILOSEC) 40 mg capsule TAKE 1 CAPSULE BY MOUTH TWICE DAILY    other medication one Dose. Mupirocin dissolved in warm water  and Budesonide in NS  qhs  (sometimes increases to BID) polyethylene glycol 3350  (MIRALAX ) 17 g packet Take one packet by mouth daily as needed.    potassium chloride  SR (K-DUR) 20 mEq tablet TAKE 2 TABLETS BY MOUTH ON MONDAY, WEDNESDAY AND FRIDAY AND 1 TABLET ALL OTHER DAYS    predniSONE (DELTASONE) 5 mg tablet     pyRIDostigmine bromide (MESTINON) 60 mg tablet Take two tablets by mouth three times daily.      rosuvastatin  (CRESTOR ) 40 mg tablet TAKE 1 TABLET BY MOUTH AT BEDTIME    spironolactone  (ALDACTONE ) 25 mg tablet TAKE 1 TABLET BY MOUTH EVERY DAY WITH FOOD    tamsulosin  (FLOMAX ) 0.4 mg capsule Take one capsule by mouth daily.    testosterone cypionate 200 mg/mL kit Inject 1 mL into the muscle every 14 days.    traZODone (DESYREL) 100 mg tablet Take one tablet by mouth at bedtime daily.    Vacuum Erection Device System kit Use as directed for sexual activity.    warfarin (COUMADIN ) 3 mg tablet Take one tablet by mouth at bedtime daily.     Vitals:    04/29/24 1608   BP: 121/61   Pulse: 90   SpO2: 98%   PainSc: Zero   Weight: 98.9 kg (218 lb)   Height: 180.3 cm (5' 11)     Body mass index is 30.4 kg/m?SABRA     Physical Exam  General appearance: Pleasant. No acute distress.   Ears, Nose, and Throat: Nares patent. Mallampati II-III. Narrow palate. Mild retrognathia.   Cardiovascular: Regular rate and rhythm.   Lungs: Clear to auscultation bilaterally.   Extremities: Wearing compression stockings.    Neurological: Alert, attentive, cooperative. Hypomimia. Resting tremor L hand.   Psychiatric: Normal mood and affect.            Assessment and Plan:  Osa (Obstructive Sleep Apnea)     CPAP titration (03/13/20);  6 CM H20 recommended  PLMI 4.4, PLMAI 0.6  Increased muscle tone noted during some epochs of REM     Split night sleep study (04/16/21):  AHI 11.6 (CAI 0), SpO2 min 83%, 6.3 min less than or equal to 88%  CPAP 9 cm H20  PLMI Diagnostic portion 92.3 , PLMI Treatment Portion 18/hr  without arousals         Rem Sleep Behavior Disorder         OSA (obstructive sleep apnea)  OSA on CPAP.   Reviewed download in detail.   Patient is experiencing symptomatic benefit from CPAP. He has excellent compliance and OSA is controlled on current CPAP settings.  Various aspects of CPAP usage and compliance were discussed with patient. He was advised to use CPAP  during any sleep period.   He would like to try FFM option given dry mouth/mouth breathing. Refer to DME for mask fitting.                      REM sleep behavior disorder  Increased muscle tone during REM sleep was noted during split night sleep study, in the absence of significant respiratory events. There were not large body movements observed during the study.   Wife denies any recent dream enactment episodes on melatonin.  I counseled the patient extensively on maintaining a safe sleep environment for the patient and bed partner.   As he reports no episodes on melatonin 5 mg, he will continue on this dose for now.   Bennet was advised to update the clinic if he has recurrent dream enactment episodes.                  The risks associated with driving or operating heavy machinery while sleepy were discussed with the patient, who understands and agrees to take measures to eliminate these risks.     Return to clinic in 1 year, sooner if needed.  A CPAP download will be obtained to evaluate compliance and efficacy of treatment.

## 2024-04-29 NOTE — Patient Instructions
 Keep up the great work with your CPAP machine.   I will refer you to the equipment company for a mask fitting with a full face mask.     Continue melatonin 5 mg at bedtime.

## 2024-04-29 NOTE — Telephone Encounter
-----   Message from JINNY Man, MD sent at 04/29/2024  5:15 PM CDT -----  Regarding: DME order  Your assistance sending DME order for mask fitting with full face mask is appreciated.   Thank you.

## 2024-05-01 ENCOUNTER — Encounter: Admit: 2024-05-01 | Discharge: 2024-05-01 | Payer: MEDICARE

## 2024-05-03 ENCOUNTER — Encounter: Admit: 2024-05-03 | Discharge: 2024-05-03 | Payer: MEDICARE

## 2024-05-03 MED ORDER — OMEPRAZOLE 40 MG PO CPDR
40 mg | ORAL_CAPSULE | Freq: Two times a day (BID) | ORAL | 0 refills | 90.00000 days | Status: AC
Start: 2024-05-03 — End: ?

## 2024-05-03 MED ORDER — FAMOTIDINE 40 MG PO TAB
40 mg | ORAL_TABLET | Freq: Every day | ORAL | 0 refills | 90.00000 days | Status: AC
Start: 2024-05-03 — End: ?

## 2024-05-03 NOTE — Telephone Encounter
 All Protocol Elements met  Medication name:   Name from pharmacy: OMEPRAZOLE  40MG           Will file in chart as: omeprazole  DR (PRILOSEC) 40 mg capsule    Sig: TAKE 1 CAPSULE BY MOUTH TWICE DAILY    Disp: 180 capsule    Refills: 0    Start: 05/03/2024    Class: Normal    To pharmacy: * * N O T I C E * * Last quantity doesn't match original quantity    Last ordered: 1 month ago (03/12/2024) by Emerick JINNY Mace, DO    Last refill: 03/14/2024    Rx #: [id number]    Gastroenterology: Acid Reflux Passed08/15/2025 05:17 PM   Protocol Details Valid encounter within last 12 months       Name from pharmacy: FAMOTIDINE  40MG          Will file in chart as: famotidine  (PEPCID ) 40 mg tablet    Sig: TAKE 1 TABLET BY MOUTH EVERY DAY    Disp: 90 tablet    Refills: 0    Start: 05/03/2024    Class: Normal    To pharmacy: * * N O T I C E * * Last quantity doesn't match original quantity    Last ordered: 1 month ago (03/12/2024) by Emerick JINNY Mace, DO    Last refill: 03/14/2024    Rx #: [id number]    Gastroenterology: Acid Reflux Passed08/15/2025 05:17 PM   Protocol Details Valid encounter within last 12 months      To be filled at: [facility] [place], [place] - [address]         Patient was last seen on 04/24/2024 and should be seen on or around 10/16/2024.

## 2024-05-08 ENCOUNTER — Encounter: Admit: 2024-05-08 | Discharge: 2024-05-08 | Payer: MEDICARE

## 2024-05-08 ENCOUNTER — Ambulatory Visit: Admit: 2024-05-08 | Discharge: 2024-05-08 | Payer: MEDICARE

## 2024-05-08 DIAGNOSIS — G20A1 Parkinson's disease, unspecified whether dyskinesia present, unspecified whether manifestations fluctuate (CMS-HCC): Secondary | ICD-10-CM

## 2024-05-08 DIAGNOSIS — R1310 Dysphagia, unspecified: Principal | ICD-10-CM

## 2024-05-08 MED ADMIN — BARIUM SULFATE 40 % (W/V) PO SUSP [95015]: 10 mL | ORAL | @ 20:00:00 | Stop: 2024-05-08 | NDC 32909011500

## 2024-05-08 MED ADMIN — BARIUM SULFATE 81 % (W/W) PO POWD [174091]: 30 mL | ORAL | @ 20:00:00 | Stop: 2024-05-08 | NDC 32909010510

## 2024-05-08 MED ADMIN — BARIUM SULFATE 40 % (W/V), 30% (W/W) PO PSTE [173397]: 10 mL | ORAL | @ 20:00:00 | Stop: 2024-05-08 | NDC 32909012522

## 2024-05-08 MED ADMIN — BARIUM SULFATE 700 MG PO TAB [301158]: 700 mg | ORAL | @ 20:00:00 | Stop: 2024-05-08 | NDC 10361077831

## 2024-05-08 NOTE — Progress Notes
 ATTESTATION    I have reviewed the information from this visit and agree with the impression and recommendations.    Staff name:  JINNY Alm Churn, MD Date:  05/08/2024

## 2024-05-08 NOTE — Progress Notes
 [facility]  Modified Barium Swallow (MBS) Evaluation [phone])  Staff:  DOROTHA Alm Churn, M.D.  Date of Service:  05/08/2024    Johnny [name] is a 78 y.o. male, referred to this service by Dr. Chiquita Ship for a speech pathology modified barium swallow evaluation with a diagnosis of dysphagia. The patient's past medical history is significant for Parkinson disease, Myasthenia gravis, GERD, COPD, hiatal hernia and COPD.  Patient is currently receiving 100% of his nutrition and hydration by mouth.  The patient's current diet level is most consistent with FOIS Scale 7: Total oral diet with no restrictions.  Patient reports difficulty with occasional coughing with eating and pills sticking.  Patient stated that he uses liquid wash to clear food or pills that stick, which has been effective.  The purpose of this evaluation is to assess the efficiency and safety of the swallow mechanism and to provide a plan for intervention as warranted.    EVALUATION RESULTS:  A cursory oral mechanism was completed. Labial function was within normal limits. Lingual function was within normal limits. Dentition is appropriate for a regular diet.   Velar movements were present and symmetrical.  Jaw ROM was adequate for a regular diet.  Upon dry swallow, laryngeal elevation seemed prompt and adequate.        Lateral Views - Patient was presented with the following consistencies during this evaluation: thin liquid, nectar-thick liquid, pudding, cracker, and barium tablet. Administration was given by the patient via spoon, straw, side of cup.      Patient?s oral phase of swallow was within normal limits. Lip closure for intraoral bolus containment resulted in no labial escape.  Tongue control during bolus hold allowed bolus escape to the lateral buccal cavity/floor of mouth.  Bolus preparation and mastication resulted in timely and efficient chewing and mashing.  Bolus transport/lingual motion was with brisk tongue motion.  Oral residue was a collection on oral structures.     Patient?s pharyngeal phase of swallow was within normal limits. Initiation of the pharyngeal swallow occured when the bolus head was in the pyriform sinuses.  Soft palate elevation resulted in no bolus between the soft palate and the pharyngeal wall.  Laryngeal elevation demonstrated complete superior movement of the thyroid cartilage with complete approximation of the arytenoids to the epiglottic  petiole.  Anterior hyoid excursion demonstrated partial anterior movement.  Epiglottic movement resulted in complete inversion.  Laryngeal vestibular closure was incomplete, with narrow column of air/contrast noted within the laryngeal vestibule at the height of the swallow.  Pharyngeal stripping wave was present and complete.  Pharyngeal contraction was complete.   Pharyngoesophageal segment (cricopharyngeal) opening was completely distended for complete duration with no obstruction of bolus flow.  Tongue base retraction allowed a trace column of contrast or air between the retracted tongue base and the posterior pharyngeal wall.  Pharyngeal residue was a trace within or on pharyngeal structures.  Pharyngeal and laryngeal sensation was adequate.    Flash penetration was appreciated with thin liquids during the swallow, consistent with Rosenbek Scale: 2 - Material enters the airway, remains above the vocal folds, and is ejected from the airway.     Anterior-Posterior Views - completed with pudding consistency.  Esophageal clearance in the upright position was complete, with only a coating of contrast, if any.     The following swallowing maneuvers were attempted during this evaluation: no swallow maneuvers were attempted.  Patient utilized multiple swallows with solids to clear trace to  mild oral residue.  Patient also utilized liquid wash to reduce globus sensation following tablet trial.     IMPRESSIONS:  Based on the above findings, [employer] presents with Noms level 6 minimal oropharyngeal dysphagia, characterized primarily by slightly delayed initiation of pharyngeal swallow and reduced laryngeal vestibular closure.  Delayed pharyngeal swallow onset with reduced laryngeal vestibular closure resulted in flash penetration of thin liquids during the swallow.  Mildly reduced anterior hyoid excursion and slightly reduced base of tongue retraction resulted in trace residue collection at the valleculae.  This is consistent with DIGEST overall rating of 0 - within functional limits, DIGEST safety grade of Grade 0 - (PAS 1-2) No penetration/aspiration or flash penetration above TVF and DIGEST efficiency grade Grade 0 - <10% minimal to no residue on all bolus types presented.    Prognosis for improvement is fair to guarded based on current swallow function presenting with minimal impairment and medical history of Parkinson Disease which may impact swallow over disease progression.    RECOMMENDATIONS/GOALS:    Long term goal: Patient will tolerate least restrictive diet without signs or symptoms of aspiration.    The following recommendations and therapy goals should be implemented:  Diet:Total oral diet    IDDSI Diet Recommendation:  Liquid: 0 - Thin  Solid: 7 - Regular    The recommended diet level is most consistent with FOIS Scale 7: Total oral diet with no restrictions.  Please note, if there are any recommendations to modify or crush medications, please check with your pharmacist before making changes.  Go slow.  Liquid wash as needed   Cough or clear as needed.  Complete gentle oral cares before and after eating or drinking.  Follow up with your physician as directed.    These findings and recommendations were discussed with this patient and referring physician.    The ENT speech pathology service plans to continue to follow this patient in the future for any potential/ongoing outpatient voice, swallowing, articulation or trismus related care as appropriate.  Please note that the patient may be referred to outside services should patient's plan of care require skills outside ENT speech pathologists specialty.

## 2024-05-15 ENCOUNTER — Encounter: Admit: 2024-05-15 | Discharge: 2024-05-15 | Payer: MEDICARE

## 2024-05-27 ENCOUNTER — Encounter: Admit: 2024-05-27 | Discharge: 2024-05-27 | Payer: MEDICARE

## 2024-05-29 ENCOUNTER — Encounter: Admit: 2024-05-29 | Discharge: 2024-05-29 | Payer: MEDICARE

## 2024-05-30 ENCOUNTER — Encounter: Admit: 2024-05-30 | Discharge: 2024-05-30 | Payer: MEDICARE

## 2024-05-30 ENCOUNTER — Ambulatory Visit: Admit: 2024-05-30 | Discharge: 2024-05-30 | Payer: MEDICARE

## 2024-05-30 VITALS — BP 98/64 | HR 86 | Ht 70.0 in

## 2024-05-30 DIAGNOSIS — G20A1 Parkinson's disease without dyskinesia or fluctuating manifestations (CMS-HCC): Principal | ICD-10-CM

## 2024-05-30 DIAGNOSIS — R0609 Other forms of dyspnea: Secondary | ICD-10-CM

## 2024-05-30 DIAGNOSIS — G4733 Obstructive sleep apnea (adult) (pediatric): Secondary | ICD-10-CM

## 2024-05-30 DIAGNOSIS — E782 Mixed hyperlipidemia: Secondary | ICD-10-CM

## 2024-05-30 DIAGNOSIS — K219 Gastro-esophageal reflux disease without esophagitis: Secondary | ICD-10-CM

## 2024-05-30 MED ORDER — EZETIMIBE 10 MG PO TAB
10 mg | ORAL_TABLET | Freq: Every evening | ORAL | 3 refills | 90.00000 days | Status: AC
Start: 2024-05-30 — End: ?

## 2024-05-30 NOTE — Progress Notes
 [facility]  Department of Neurology   [facility]    Date of service: 05/30/2024     Reason for visit:  Johnny [name] is 78 y.o. right handed male who presents today for evaluation of hand tremors, Parkinson's disease, and has h/o MG follows with Dr. Arty.  Presents with wife who aids in providing history.     LOV 11/2023  Increase levodopa  from 1 tab Tid to 2 tabs TID - has helped with his tremor     Dysphagia on pills , especially if swallows too many pills at once   Knows to swallow smaller bites , aspiration precautions     Dysphonia - will be getting injections into vocal cords with ENT     Has been exercising more in past month   Completed LSVT [facility]    Has a R sore knee     Uses cane to ambulate around the house     Meds:   carbidopa /levodopa  IR 25/100mg  2 tab TID  Mestinon 120 mg TID  Prednisone 5 mg daily     Prior trials-  Prism  lenses - prescribed by Dr. Peola - no benefit         05/29/2024   Movement Disorder Questionnaire   Since last visit I am: Slightly worse (1-25%)    Memory Problems: Moderate. Definitely affects some of my daily activities.    Hallucinations/delusions: seeing, hearing, feeling or imagining things that are not there or not true: None    Depression: feeling sad, blue, hopeless, unable to enjoy things: Slight. Occurs rarely and is not bothersome.    Anxiety: nervous, worried, tearful or tense: Slight. Occurs in certain situations and does not interfere with daily activities.    Apathy: loss of interest, enthusaism, or concern: Slight. I have lost interest in a few hobbies or elective activities, but typical daily activities are not affected.    Gambling: None.    Shopping: None.    Sex: None.    Pornography: None.    Eating: Mild. Impulsive behaviors occur occasionally but have not caused a problem for me but may be a problem for my family.    Doing unnecessary things like emptying drawers/closets in bedroom/garage and leaving things in a mess: None.    Nighttime sleep: No difficulty sleeping through the night.    Daytime sleepiness: Slight. I get sleepy occasionally after a meal or if I am watching television.    Vivid dreams: dreams that are so clear they seem real. None.    REM sleep behavior disorder: talking during or acting out your dreams. Slightly, rarely do I talk or yell in my sleep.    Restless legs syndrome: uncomfortable sensations in your legs that are uncontrollable and occur when resting, in the evenings, or when you get in bed. None.    Pain or muscle cramps: Slight. I have pain or cramps occasionally, but they are mild, and I do not take pain medication.    Urination: Slight. I have to go frequently but it is not bothersome.    Constipation: Moderate. I have occasional constipation but can manage it with over-the-counter medication.    Dizziness or lightheadedness: None.    Tiredness/Fatigue: Slightly. I can finish all my tasks/activities, but my stamina is reduced, and I feel tired more quickly than I used to.    Falling: None. I have not fallen.    Personal  Care: Slight. I take care of myself, but several activities take me longer to complete.    Assistive devices for getting around: [place]    Are you taking any form of levodopa ? Yes    Do you have OFF time while you are awake during the day? OFF time is time during the day when your Parkinson's medications are not working or not working as well as usual. None    Do you have dyskinesia while you are awake during the day? Dyskinesia refers to involuntary movements that can cause wiggling or twisting movements of the head, hands, legs, trunk, face, jaw, or any other part of the body. None    Employment: Retired - not due to [employer].        Proxy-reported       Medications:   Current Outpatient Medications on File Prior to Visit   Medication Sig Dispense Refill    albuterol  (VENTOLIN  HFA, PROAIR  HFA) 90 mcg/actuation inhaler Inhale two puffs by mouth into the lungs four times daily as needed for Wheezing.      aspirin  81 mg chewable tablet Chew one tablet by mouth at bedtime daily.      carbidopa -levodopa  (SINEMET ) 25-100 mg tablet Take two tablets by mouth three times daily. 540 tablet 3    cetirizine  (ZYRTEC ) 10 mg tablet Take one tablet by mouth daily.      cholecalciferol (Vitamin D3) (VITAMIN D-3) 1,000 units tablet Take one tablet by mouth twice daily.        coenzyme Q10 200 mg capsule Take one capsule by mouth daily.      cyanocobalamin (vitamin B-12) 500 mcg tablet Take one tablet by mouth daily.      cyclobenzaprine (FLEXERIL) 5 mg tablet May take one tablet by mouth twice daily as needed. May also take two tablets at bedtime as needed. 120 tablet 3    ECHINACEA PO Take 1 capsule by mouth daily.      famotidine  (PEPCID ) 40 mg tablet TAKE 1 TABLET BY MOUTH EVERY DAY 90 tablet 0    fesoterodine  ER (TOVIAZ ) 4 mg tablet Take one tablet by mouth daily. Do not cut/ crush/ chew 90 tablet 0    fluticasone -umeclidin-vilanter (TRELEGY ELLIPTA) 100-62.5-25 mcg inhaler Inhale 1 Dose by mouth into the lungs daily after dinner.      furosemide  (LASIX ) 40 mg tablet Take 2 tabs on Mon-Wed-Fri  Take 3 tabs on Sun-Tues-Thur-Sat 226 tablet 2    gabapentin  (NEURONTIN ) 300 mg capsule TAKE 1 CAPSULE BY MOUTH THREE TIMES DAILY (Patient taking differently: Take one capsule by mouth twice daily.) 270 capsule 3    glucosamine su 2KCl-chondroit 500-400 mg tab Take 1 Tab by mouth twice daily.      ipratropium bromide  (ATROVENT ) 42 mcg (0.06 %) nasal spray Apply two sprays to each nostril as directed twice daily as needed.      ketoconazole (NIZORAL) 2 % topical shampoo Apply  topically to affected area every 7 days. Apply topically to affected area of damp skin, lather, leave on 5 minutes, and rinse.      losartan  (COZAAR ) 50 mg tablet TAKE 1 TABLET BY MOUTH EVERY DAY 90 tablet 3    metoprolol  succinate XL (TOPROL  XL) 100 mg extended release tablet TAKE 1 TABLET BY MOUTH EVERY DAY 90 tablet 3    mucus clearing device (AEROBIKA OSCILLATING PEP SYSTM MISC) Use  as directed. Use with salt water  and inhale twice daily      omeprazole  DR (PRILOSEC)  40 mg capsule TAKE 1 CAPSULE BY MOUTH TWICE DAILY 180 capsule 0    other medication one Dose. Mupirocin dissolved in warm water  and Budesonide in NS  qhs  (sometimes increases to BID)      polyethylene glycol 3350  (MIRALAX ) 17 g packet Take one packet by mouth daily as needed.      potassium chloride  SR (K-DUR) 20 mEq tablet TAKE 2 TABLETS BY MOUTH ON MONDAY, WEDNESDAY AND FRIDAY AND 1 TABLET ALL OTHER DAYS 140 tablet 2    predniSONE (DELTASONE) 5 mg tablet       pyRIDostigmine bromide (MESTINON) 60 mg tablet Take two tablets by mouth three times daily.        rosuvastatin  (CRESTOR ) 40 mg tablet TAKE 1 TABLET BY MOUTH AT BEDTIME 90 tablet 3    spironolactone  (ALDACTONE ) 25 mg tablet TAKE 1 TABLET BY MOUTH EVERY DAY WITH FOOD 90 tablet 3    tamsulosin  (FLOMAX ) 0.4 mg capsule Take one capsule by mouth daily.      testosterone cypionate 200 mg/mL kit Inject 1 mL into the muscle every 14 days.      traZODone (DESYREL) 100 mg tablet Take one tablet by mouth at bedtime daily.      Vacuum Erection Device System kit Use as directed for sexual activity. 1 kit 11    warfarin (COUMADIN ) 3 mg tablet Take one tablet by mouth at bedtime daily.       No current facility-administered medications on file prior to visit.       Past Medical History:    Past Medical History:    Abnormal involuntary movement    ADHD (attention deficit hyperactivity disorder)    Atrial fibrillation (CMS-HCC)    Back pain    Bleeding disorder    Bronchiectasis (CMS-HCC)    CAD (coronary artery disease)    Cardiac dysrhythmia    Carpal tunnel syndrome    Cataract    Chronic bronchitis (CMS-HCC)    Chronic cough    Congestive heart disease (CMS-HCC)    Constipation    COPD (chronic obstructive pulmonary disease) (CMS-HCC)    Degenerative disc disease, lumbar    Erectile dysfunction, vasculogenic    Fracture    GERD (gastroesophageal reflux disease)    Hearing reduced    Heart attack (CMS-HCC)    Hiatal hernia    HX: anticoagulation    Hyperlipemia    Hypertension    ICD (implantable cardiac defibrillator) in place    Ischemic cardiomyopathy    Joint pain    Kidney disease    Memory loss    Mitral regurgitation    Movement disorder    Myasthenia gravis (CMS-HCC)    Myocardial infarction (CMS-HCC)    Obesity    Obesity, Class I, 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)    OSA on CPAP    Osteoarthritis    Other dysphagia    Parkinson disease (CMS-HCC)    Pre-diabetes    Pulmonary fibrosis (CMS-HCC)    S/P ICD (internal cardiac defibrillator) procedure    Seasonal allergic reaction    Seasonal allergies    Spinal stenosis    Ulcer    Unspecified sinusitis (chronic)        Social History:    Social History     Socioeconomic History    Marital status: Married     Spouse name: Aldona    Number of children: 4   Tobacco Use    Smoking status: Never    Smokeless  tobacco: Never   Vaping Use    Vaping status: Never Used   Substance and Sexual Activity    Alcohol use: Not Currently     Comment: very rarely 4 drinks per year    Drug use: Never    Sexual activity: Yes     Partners: Female     Birth control/protection: Surgical       Family History:   Family History   Problem Relation Name Age of Onset    Cancer Father Graysyn     Heart problem Father Cabell     Heart Disease Father Honorio     Hypertension Mother Heron     Joint Pain Mother Heron     Cancer Mother Heron     Cancer Sister Lindwood     Arthritis Mother Manuel Dall    Allergy-severe Mother Heron     Diabetes Maternal Grandmother Ava         35        Allergies:   Allergies   Allergen Reactions    Clarithromycin RASH     [place] and burns both ends of GI tract    Ketoconazole SEE COMMENTS     Had bloodshot eyes after use, and sore.  Happens if he gets drug in his eyes.  Tolerates shampoo fine.    Lisinopril COUGH    Oxycodone  UNKNOWN     Nightmares  Tolerates Tramadol          PHYSICAL EXAMINATION:      VITAL SIGNS:   Vitals:    05/30/24 1243 05/30/24 1248   BP: 127/72 111/64   BP Source: Arm, Right Upper Arm, Right Upper   Patient Position: Sitting Standing   Pulse: 92 89       Body mass index is 31.66 kg/m?SABRA    Overweight    NEUROLOGICAL EXAM:  MS: Alert, awake, cooperative. Speech fluent without paraphasic errors. Able to provide accurate history, remote memory intact. Good fund of knowledge.    Movement disorders exam  Mild hypophonia  Mild facial masking  Mild intermittent L resting tremor  Mild bilateral postural and action tremor   Mild bilateral rigidity  Mild bilateral L > R bradykinesia with finger taps, supination/pronation, hand grips, or foot taps/stomps  Stands up unassisted  Posture slightly stooped  Gait with walking stick is narrow based, steady with fair stride length and arm swing , antalgic of back     UPDRS Motor:            Labs/Imaging:      Assessment/Plan:    Peyton Spengler is a 78 y.o. right handed male PMH CAD s//p 6vCABG 2006, COPD, atrial fibrillation, s/p ICD, HTN, OSA compliant with CPAP, neuropathy. who presents today for evaluation of hand tremors, symptom onset 1993 with worsening of L hand tremor around 2018. Started on levodopa  in 2022 and noticed improvement in tremor, movements. Examination reveals a mild bilateral postural/action tremor, with an intermittent L resting tremor, and mild L  > R bradykinesia/ rigidity. 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 Arty    Overall stable with PD     Plan:   Continue carbidopa /levodopa  IR 25/100mg  to 2 tabs 3x/day     Dysphagia - aspiration precautions discussed   To improve the safety of your swallowing, use the compensatory strategies below:  Sit fully  upright in a chair while eating & drinking  Slow rate of intake - eat slowly  Small, single bites / sips  Do not tilt your head back when eating or drinking  Tilt head and chin slightly forward (45 degrees)  Avoid distractions during meals - do not talk when eating  Alternate solids / liquids                 Use of effortful/ hard swallow, especially with solids  Swallow 2 times per bite /drink      Oral care 2-3x/daily  Consider changing the texture of food to something more manageable such as chopping or puree  If liquids are difficult to swallow, try adding a thickener such as Thick It ? or Thicken Up?  which can be ordered at your pharmacy or purchase thickened juices     Continue PT/ exercise     Follow up in 6 -8 months    Problem List Items Addressed This Visit    None  Visit Diagnoses         Parkinson's disease without dyskinesia or fluctuating manifestations (CMS-HCC)    -  Primary            Alfonso Ruth, MD  Assistant Professor of Neurology  [facility]  [facility]          I spent a total of 30 minutes on this patient's care on the day of their visit excluding time spent related to any illed procedures. This time includes face-to-face time with the patient as well as time spent documenting in the medical record, reviewing patient's records and tests, obtaining history, placing orders, communicating with other healthcare professionals, counseling the patient, family, or caregiver, and/or care coordination for the diagnoses above.

## 2024-05-30 NOTE — Progress Notes
 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      Bennet presents today in follow-up.  He is accompanied by his wife.  He is currently undergoing neurology evaluation and treatment for both Parkinson's and myasthenia gravis.  For ambulation, he now requires 2 canes to get around.  He also has significant dyspnea if he has to walk more than 1 block or so.    Bennet tries to stay active with some exercise, [facility], as well as some Parkinson's exercises.         Vitals:    05/30/24 1058   BP: 98/64   BP Source: Arm, Left Upper   Pulse: 86   SpO2: 97%   O2 Device: CPAP/BiPAP   PainSc: Zero   Height: 177.8 cm (5' 10)     Body mass index is 31.28 kg/m?.     Cardiovascular Studies  Preliminary EKG from today:    Most recent results for 12-Lead ECG   ECG 12-LEAD    Collection Time: 05/30/24 11:07 AM   Result Value Status    VENTRICULAR RATE 86 Incomplete    P-R INTERVAL 166 Incomplete    QRS DURATION 160 Incomplete    Q-T INTERVAL 404 Incomplete    QTC CALCULATION (BAZETT) 483 Incomplete    P AXIS 91 Incomplete    R AXIS -14 Incomplete    T AXIS 44 Incomplete    Impression    Normal sinus rhythm  Right bundle branch block  Abnormal ECG  When compared with ECG of 21-Feb-2023 14:20,  No significant change was found     *Note: Due to a large number of results and/or encounters for the requested time period, some results have not been displayed. A complete set of results can be found in Results Review.        The ASCVD Risk score (Arnett DK, et al., 2019) failed to calculate for the following reasons:    Risk score cannot be calculated because patient has a medical history suggesting prior/existing ASCVD    Problems Addressed Today  Encounter Diagnoses   Name Primary?    Cardiovascular symptoms Yes    Ischemic cardiomyopathy     Chronic systolic (congestive) heart failure (CMS-HCC)     ICD (implantable cardioverter-defibrillator), dual, in situ     Primary hypertension     Mixed hyperlipidemia     Atrial Fib Paroxysmal  (HCC)     DOE (dyspnea on exertion)     Nonrheumatic mitral valve regurgitation     OSA (obstructive sleep apnea)     Gastroesophageal reflux disease, unspecified whether esophagitis present     Prediabetes    Atrial Fib Paroxysmal  (HCC)  Overall A-fib burden on last device check was 0.1%.  He continues on warfarin.  He tells me he is considering being part of a search study that would allow him to come off his anticoagulation, and would be getting some type of left atrial appendage occlusive device.  He is rate controlled on metoprolol .    Hypertension  Blood pressures are well-controlled with his current dose of metoprolol  and  losartan .  Check CMP with next labs     Ischemic cardiomyopathy  Last echo was in 2023 which showed normal LV for function, with mitral valve repair without stenosis, with a mean gradient of 2 mmHg.  As it has now been over 2 years, and he has more exertional dyspnea, we will go ahead and recheck an echo in the next coming months.     Automatic implantable cardioverter-defibrillator in situ  Last device interrogation from July with stable sensing and pacing thresholds.  AT/AF burden: 0.1%.  8 episodes of NSVT, longest episode lasting 9 beats.  Scheduled for in office device check as well as follow-up with Dr. Laurian in November.    Dyslipidemia  No recent lipid panel on file.  Continue current doses of atorvastatin  and ezetimibe   Recheck lipid panel in the next coming weeks and have results forwarded to our office    Screening for diabetes/prediabetes  No recent A1c on file. Will check with next labs  There is some research suggesting that GLP-1 therapy can be helpful with Parkinson's disease.  If A1c is in the diabetic range, would be worth discussing Ozempic with him as long as this is affordable.    Parkinson's disease and myasthenia gravis  Follows with Dr. Jama in the movement disorders clinic for Parkinson's disease, and with Dr. Arty in the neuromuscular clinic for [facility]                 Achille JONELLE Deer, APRN-NP      Total Time Today was 40 minutes in the following activities: Preparing to see the patient, Obtaining and/or reviewing separately obtained history, Performing a medically appropriate examination and/or evaluation, Counseling and educating the patient/family/caregiver, Ordering medications, tests, or procedures, Referring and communication with other health care professionals (when not separately reported), and Documenting clinical information in the electronic or other health record    This note was partially dictated using Dragon Medical One speech recognition software.  Occasional wrong-word or sound-alike substitutions may have occurred due to the inherent limitations of voice-recognition software.  Please read the chart carefully and recognize, using context, where the substitutions may have occurred.  Please do not hesitate to contact me for clarification.       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  EF has improved but patient needs to continue HFrEF oriented medical therapy      VT (ventricular tachycardia) (CMS-HCC) 02/28/2018     02/19/2020 - Regadenoson MPI:  Small basal to mid inferolateral wall  fixed perfusion defec with corresponding hypokinesis on gated images suggestive of myocardial injury & small size, mild basal anterolateral wall  predominantly reversible perfusion defect  Corresponding regional wall motion is normal.  All myocardial segments appear viable.EF 46%.  High risk scintigraphic features are absent.       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 7/06 Rx Vytorin 10/40, then to 10/80 approx 04/28/05. Recalls knee pain w/ lovastatin  b. 08/03/05 total 156 trig 118 HDL 39 LDL 95  C. 02/09/09 total 148 trig 114  HDL 43  LDL 81 Vytorin 10/80 Fishoil  >>Starts Lipitor 80 + Zetia  March 2011      Sleep apnea 02/11/2009     Sleep apnea      a. 2/07 Using CPAP, good tolerance and benefit after Sleep study approx 2003 Dr. Naomia      Hypertension 02/11/2009     Hypertension        a. ACE cough on lisinopril, olmesartan 20 well tolerated       Cerebrovascular disease 02/11/2009     Cerebrovascular disease       a. 7/06 Carotid US  mod disease < 50% sten      Severe MR treated with valvuloplasty 2006 02/11/2009     a.  04/14/05 #28 [place] ring, Resect P2 Seg Post leaflet,ring.as above w/ CABG-[place]      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     a. 04/14/05: CABG x 5 LIMA-LAD, L Rad-Dx, sSVG-RCA-RCA, sSVG-OM & MV Plasty for MR  with initial trigger for evaluation being murmur and dyspnea  b. 12/13/05 EF 35% by aden thall, Mixed defect in Cx distribution. Referred for ICD   c. Rash w/ Spiro, switch to Inspra  d. 6/07 Removal of all 8 sternal wires and parasternal lipoma d/t persisting pain  E. Jan, 2011 Regaden Smith. Limited inferior Defect, unchanged from 2007. EF 48%  F. 10/08/13: Reg/Thall Stress: EF 48% No ischemia or change from prior studies.    January 15, 2018  Lexiscan sestamibi study;  sinus rhythm, no arrhythmias or EKG changes    EF was 57% with normal wall motion and normal perfusion. [employer]       Atrial Fib Paroxysmal  (HCC) 02/08/2009     a. 7/06 Early post CAB Afib, Brief amio pre dismissal. Post DC AF still rapid, admit [facility]  b. 05/23/05 TEE: No LA clot, EF 25% Cvert to SR on Ticosyn 500 BID, dec'd to 250 BID w/             QTC@ , QTc to 470. Home 05/26/05  c. 11/29/05 Ticosyn DC'd, Dr. Fayette p recurrent afib on ticosyn  D. 12/14 NSR on clinic exam, MAC OV (CBP)  E. 05/16/16 ICD interrogation: 6.2% atrial fibrillation burden, on warfarin   F 11/22/2018   Paroxysmal atrial fibrillation RF ablation  Dr Laurian          Automatic  implantable cardioverter-defibrillator in situ 01/06/2006     01/06/06 Medtronic ICD  W/ Fidelis RV lead for SCD prophylaxis Dr. Maybell  12/17/13 Generator at ERI,  Fidelis RV-ICD lead extraction w/ laser assistance,Implant Medtronic Evera XT ICD and new RV lead. RA lead left i tact. DFTs done. Dr. Maylene           ROS    Physical Exam   Vitals reviewed.  HENT:   Head: Normocephalic.   Nose: Nose normal.   Neck: Carotid bruit is not present.   Cardiovascular: Normal rate, regular rhythm and normal pulses.   Murmur heard.  Pulmonary/Chest: Effort normal and breath sounds normal.   Abdominal: Soft. Normal appearance and bowel sounds are normal.   Musculoskeletal:      Cervical back: Normal range of motion.      Right lower leg: Edema present.      Left lower leg: Edema present.   Neurological: He is alert. He displays weakness. Gait abnormal.   Skin: Skin is warm and dry. Capillary refill takes less than 2 seconds.   Psychiatric: His behavior is normal. Mood, affect, judgment and thought content normal.       Cardiovascular Health Factors  Vitals BP Readings from Last 3 Encounters:   05/30/24 98/64   04/29/24 121/61   04/24/24 119/60     Wt Readings from Last 3 Encounters:   04/29/24 98.9 kg (218 lb)   04/24/24 98.9 kg (218 lb)   04/15/24 98.9 kg (218 lb)     BMI Readings from Last 3 Encounters:   05/30/24 31.28 kg/m?   04/29/24 30.40 kg/m?   04/24/24 30.40 kg/m?      Smoking Tobacco Use History[1]   Lipid Profile Cholesterol   Date Value Ref Range Status   06/09/2020 138  Final     HDL   Date Value Ref Range Status   06/09/2020 55  Final     LDL   Date Value Ref Range Status   06/09/2020 60  Final     Triglycerides   Date Value Ref Range Status   06/09/2020 117  Final      Blood Sugar Hemoglobin A1C   Date Value Ref Range Status   09/23/2010 6.2       Glucose   Date Value Ref Range Status   03/31/2021 102 (H) 70 - 100 MG/DL Final   95/94/7977 897 (H) 70 - 100 MG/DL Final   97/82/7977 883 (H) 70 - 105 Final     Glucose, POC   Date Value Ref Range Status   02/27/2023 130 (H) 70 - 100 MG/DL Final   93/89/7975 868 (H) 70 - 100 MG/DL Final   87/89/7984 875 (H) 70 - 100 MG/DL Final                 Current Medications (including today's revisions)   albuterol  (VENTOLIN  HFA, PROAIR  HFA) 90 mcg/actuation inhaler Inhale two puffs by mouth into the lungs four times daily as needed for Wheezing.    aspirin  81 mg chewable tablet Chew one tablet by mouth at bedtime daily.    carbidopa -levodopa  (SINEMET ) 25-100 mg tablet Take two tablets by mouth three times daily.    cetirizine  (ZYRTEC ) 10 mg tablet Take one tablet by mouth daily.    cholecalciferol (Vitamin D3) (VITAMIN D-3) 1,000 units tablet Take one tablet by mouth twice daily.      coenzyme Q10 200 mg capsule Take one capsule by mouth daily.    cyanocobalamin (  vitamin B-12) 500 mcg tablet Take one tablet by mouth daily.    cyclobenzaprine (FLEXERIL) 5 mg tablet May take one tablet by mouth twice daily as needed. May also take two tablets at bedtime as needed.    ECHINACEA PO Take 1 capsule by mouth daily.    ezetimibe  (ZETIA ) 10 mg tablet Take one tablet by mouth at bedtime daily.    famotidine  (PEPCID ) 40 mg tablet TAKE 1 TABLET BY MOUTH EVERY DAY    fesoterodine  ER (TOVIAZ ) 4 mg tablet Take one tablet by mouth daily. Do not cut/ crush/ chew    fluticasone -umeclidin-vilanter (TRELEGY ELLIPTA) 100-62.5-25 mcg inhaler Inhale 1 Dose by mouth into the lungs daily after dinner.    furosemide  (LASIX ) 40 mg tablet Take 2 tabs on Mon-Wed-Fri  Take 3 tabs on Sun-Tues-Thur-Sat    gabapentin  (NEURONTIN ) 300 mg capsule TAKE 1 CAPSULE BY MOUTH THREE TIMES DAILY (Patient taking differently: Take one capsule by mouth twice daily.)    glucosamine su 2KCl-chondroit 500-400 mg tab Take 1 Tab by mouth twice daily.    ipratropium bromide  (ATROVENT ) 42 mcg (0.06 %) nasal spray Apply two sprays to each nostril as directed twice daily as needed.    ketoconazole (NIZORAL) 2 % topical shampoo Apply  topically to affected area every 7 days. Apply topically to affected area of damp skin, lather, leave on 5 minutes, and rinse.    losartan  (COZAAR ) 50 mg tablet TAKE 1 TABLET BY MOUTH EVERY DAY    metoprolol  succinate XL (TOPROL  XL) 100 mg extended release tablet TAKE 1 TABLET BY MOUTH EVERY DAY    mucus clearing device (AEROBIKA OSCILLATING PEP SYSTM MISC) Use  as directed. Use with salt water  and inhale twice daily    omeprazole  DR (PRILOSEC) 40 mg capsule TAKE 1 CAPSULE BY MOUTH TWICE DAILY other medication one Dose. Mupirocin dissolved in warm water  and Budesonide in NS  qhs  (sometimes increases to BID)    polyethylene glycol 3350  (MIRALAX ) 17 g packet Take one packet by mouth daily as needed.    potassium chloride  SR (K-DUR) 20 mEq tablet TAKE 2 TABLETS BY MOUTH ON MONDAY, WEDNESDAY AND FRIDAY AND 1 TABLET ALL OTHER DAYS    predniSONE (DELTASONE) 5 mg tablet     pyRIDostigmine bromide (MESTINON) 60 mg tablet Take two tablets by mouth three times daily.      rosuvastatin  (CRESTOR ) 40 mg tablet TAKE 1 TABLET BY MOUTH AT BEDTIME    spironolactone  (ALDACTONE ) 25 mg tablet TAKE 1 TABLET BY MOUTH EVERY DAY WITH FOOD    tamsulosin  (FLOMAX ) 0.4 mg capsule Take one capsule by mouth daily.    testosterone cypionate 200 mg/mL kit Inject 1 mL into the muscle every 14 days.    traZODone (DESYREL) 100 mg tablet Take one tablet by mouth at bedtime daily.    Vacuum Erection Device System kit Use as directed for sexual activity.    warfarin (COUMADIN ) 3 mg tablet Take one tablet by mouth at bedtime daily.                 [1]   Social History  Tobacco Use   Smoking Status Never   Smokeless Tobacco Never

## 2024-05-30 NOTE — Telephone Encounter
 Pt called and LVM stating that he has an appt with Dr. Jama at 2:30 and also has an appt with Cardiology at 2:00. Wondered if he would be able to be seen earlier than 2:30 today.

## 2024-05-30 NOTE — Telephone Encounter
 Called pt and LVM to let him know that Dr. Jama can see him at 12:30. Left call back number [phone].

## 2024-05-30 NOTE — Telephone Encounter
 05/30/2024 at 10:55 AM   Patient LVM requesting to be seen earlier as he has an appointment with Dr. Jama in Neurology at 2:30 PM.     Called patient back. Patient reports that he walked up to the cardiology clinic and spoke to front desk. He reports that front desk is checking with clinic nurse now and it seems like they will be seeing him early. Patient appreciative of return call. No other questions or concerns voiced at this time.

## 2024-05-30 NOTE — Patient Instructions
 To improve the safety of your swallowing, use the compensatory strategies below:  Sit fully upright in a chair while eating & drinking  Slow rate of intake - eat slowly  Small, single bites / sips  Do not tilt your head back when eating or drinking  Tilt head and chin slightly forward (45 degrees)  Avoid distractions during meals - do not talk when eating  Alternate solids / liquids                 Use of effortful/ hard swallow, especially with solids  Swallow 2 times per bite /drink      Oral care 2-3x/daily  Consider changing the texture of food to something more manageable such as chopping or puree  If liquids are difficult to swallow, try adding a thickener such as Thick It ? or Thicken Up?  which can be ordered at your pharmacy or purchase thickened juices

## 2024-05-31 DIAGNOSIS — Z95810 Presence of automatic (implantable) cardiac defibrillator: Secondary | ICD-10-CM

## 2024-05-31 DIAGNOSIS — R7303 Prediabetes: Secondary | ICD-10-CM

## 2024-05-31 DIAGNOSIS — I255 Ischemic cardiomyopathy: Secondary | ICD-10-CM

## 2024-05-31 DIAGNOSIS — R0609 Other forms of dyspnea: Secondary | ICD-10-CM

## 2024-05-31 DIAGNOSIS — E782 Mixed hyperlipidemia: Secondary | ICD-10-CM

## 2024-05-31 DIAGNOSIS — K219 Gastro-esophageal reflux disease without esophagitis: Secondary | ICD-10-CM

## 2024-05-31 DIAGNOSIS — I5022 Chronic systolic (congestive) heart failure: Secondary | ICD-10-CM

## 2024-05-31 DIAGNOSIS — I10 Essential (primary) hypertension: Secondary | ICD-10-CM

## 2024-05-31 DIAGNOSIS — R0989 Other specified symptoms and signs involving the circulatory and respiratory systems: Secondary | ICD-10-CM

## 2024-05-31 DIAGNOSIS — I340 Nonrheumatic mitral (valve) insufficiency: Secondary | ICD-10-CM

## 2024-05-31 DIAGNOSIS — I480 Paroxysmal atrial fibrillation: Secondary | ICD-10-CM

## 2024-05-31 DIAGNOSIS — G4733 Obstructive sleep apnea (adult) (pediatric): Secondary | ICD-10-CM

## 2024-06-04 ENCOUNTER — Ambulatory Visit: Admit: 2024-06-04 | Discharge: 2024-06-05 | Payer: MEDICARE

## 2024-06-04 ENCOUNTER — Encounter: Admit: 2024-06-04 | Discharge: 2024-06-04 | Payer: MEDICARE

## 2024-06-04 VITALS — BP 123/73 | HR 89 | Temp 97.10000°F | Ht 71.0 in | Wt 216.0 lb

## 2024-06-04 DIAGNOSIS — R490 Dysphonia: Principal | ICD-10-CM

## 2024-06-04 DIAGNOSIS — J383 Other diseases of vocal cords: Secondary | ICD-10-CM

## 2024-06-04 DIAGNOSIS — R1310 Dysphagia, unspecified: Secondary | ICD-10-CM

## 2024-06-04 MED ORDER — RESTYLANE-L 1ML INJECTION
1 | Freq: Once | INTRAMUSCULAR | 0 refills | Status: CP
Start: 2024-06-04 — End: ?
  Administered 2024-06-04: 21:00:00 20 mg via INTRAMUSCULAR

## 2024-06-04 NOTE — Progress Notes
 See procedure note.

## 2024-06-04 NOTE — Procedures
 Hospital/Clinic: [facility]  ENT Clinic    Preoperative Diagnosis:   1.  Vocal cord atrophy  2.  Dysphonia  3. Parkinsons    Postoperative Diagnosis:   Same    Procedure: Flexible laryngoscopy with vocal cord injection -- 68425    Surgeon: Chiquita Ship, M.D.    Assistant: Laneta Lipps SLP    Estimated Blood Loss: Minimal     Anesthesia: Local anesthesia, as described below    Findings:   Bilateral TVC mobile with concave medial edges.    0.4 ml of restylane injected into the right paraglottic space. 0.3 ml of restylane injected into the left paraglottic space.     Complications: None    Procedure:     After explaining the risks and benefits of the procedure, informed consent was obtained. Risks include, but are not limited to, pain, infection, bleeding, scarring, injury to lips/teeth/tongue/gums, worsened voice/breathing/swallow, vocal cord scar, recurrence of disease, reaction to medications, need for additional treatment.  The patient was taken to the procedure room and placed in a sitting position. Topical neosinephrine and 4% lidocaine  were used to anesthetize the right nare. A distal chip scope was passed through the right nare and to visualize the larynx. The remainder of the procedure was performed under guidance from the scope. A curved injection cannula was used to apply 8 cc of 2% lidocaine  topically to the larynx. After adequate anesthetic effect was achieved, attention was turned to the injection. In a similar fashion, 0.4ml of Restalyne was injected into the right paraglottic space and 0.3 ml was injected into the left paraglottic space using the curved transoral cannula and integra 27 g injection needle. The vibratory edges were noted to move medially, just past midline. There was no superficial injectate noted. The airway remained widely patent and there was minimal bleeding. The needle and cannula were removed. The patient was asked to phonate, and the voice sounded mildly strained. The scope were removed. The patient tolerated the procedure well and left the clinic in stable condition.    Plan:  - Voice therapy.   - RTC in 4-5 months for repeat stroboscopy and consideration of repeat injection augmentation if desired/indicated.     Chiquita Ship, M.D.  Assistant Professor, Laryngology  Department of Otolaryngology-Head and Neck Surgery

## 2024-06-04 NOTE — Progress Notes
 I assisted the physician during this procedure by providing visualization via endoscopy.

## 2024-06-07 ENCOUNTER — Encounter: Admit: 2024-06-07 | Discharge: 2024-06-07 | Payer: MEDICARE

## 2024-06-07 ENCOUNTER — Ambulatory Visit: Admit: 2024-06-07 | Discharge: 2024-06-07 | Payer: MEDICARE

## 2024-06-07 ENCOUNTER — Ambulatory Visit: Admit: 2024-06-07 | Discharge: 2024-06-08 | Payer: MEDICARE

## 2024-06-07 VITALS — BP 128/63 | HR 86 | Ht 71.0 in | Wt 216.0 lb

## 2024-06-07 DIAGNOSIS — Z9682 Presence of neurostimulator: Secondary | ICD-10-CM

## 2024-06-07 DIAGNOSIS — M25561 Pain in right knee: Secondary | ICD-10-CM

## 2024-06-07 DIAGNOSIS — G589 Mononeuropathy, unspecified: Secondary | ICD-10-CM

## 2024-06-07 DIAGNOSIS — Z9689 Presence of other specified functional implants: Secondary | ICD-10-CM

## 2024-06-07 DIAGNOSIS — M792 Neuralgia and neuritis, unspecified: Secondary | ICD-10-CM

## 2024-06-07 DIAGNOSIS — M5417 Radiculopathy, lumbosacral region: Principal | ICD-10-CM

## 2024-06-07 MED ORDER — CYCLOBENZAPRINE 10 MG PO TAB
10 mg | ORAL_TABLET | Freq: Three times a day (TID) | ORAL | 3 refills | 30.00000 days | Status: AC | PRN
Start: 2024-06-07 — End: ?

## 2024-06-07 NOTE — Progress Notes
 [facility]  Subjective     Chief Complaint: Pain  Chief Complaint   Patient presents with    Right Knee - Pain    Spine - Pain       HPI: Johnny [name] is a 78 y.o. male who  has a past medical history of Abnormal involuntary movement (1994), ADHD (attention deficit hyperactivity disorder), Atrial fibrillation (CMS-HCC) (02/08/2009), Back pain, Bleeding disorder (when started Xarelto ), Bronchiectasis (CMS-HCC), CAD (coronary artery disease) (02/08/2009), Cardiac dysrhythmia (2007), Carpal tunnel syndrome, Cataract (2017), Chronic bronchitis (CMS-HCC), Chronic cough, Congestive heart disease (CMS-HCC), Constipation (2015), COPD (chronic obstructive pulmonary disease) (CMS-HCC), Degenerative disc disease, lumbar (2010), Erectile dysfunction, vasculogenic, Fracture, GERD (gastroesophageal reflux disease), Hearing reduced (2015), Heart attack (CMS-HCC) (possible in 2006), Hiatal hernia, anticoagulation, Hyperlipemia (02/11/2009), Hypertension (02/11/2009), 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), 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), Joint pain (1966), Kidney disease (2019), Memory loss (this year mostly), Mitral regurgitation, Movement disorder (2019), Myasthenia gravis (CMS-HCC) (2023 - father also had MG), Myocardial infarction (CMS-HCC) (2006), Obesity, Obesity, Class I, 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), OSA on CPAP (02/11/2009), Osteoarthritis, Other dysphagia (years ago), Parkinson disease (CMS-HCC) (2022), Pre-diabetes, Pulmonary emphysema (CMS-HCC), Pulmonary fibrosis (CMS-HCC), S/P ICD (internal cardiac defibrillator) procedure (02/11/2009), Seasonal allergic reaction (1960), Seasonal allergies, Spinal stenosis (2010), Ulcer, and Unspecified sinusitis (chronic) (2010). who presents for evaluation.    He has been having a severe flare up of pain about 6 weeks ago.     Believes it was due to an intensive exercise class.     It has improved some in the interim.     The pain was in the low back and in the RLE from the knee down to the foot.       S/P SCS Nevro placement on 11/17/2021  Before the flare up, he was having about 50% relief.     Has Nalu PNS for Rt knee pain placed on 07/19/23  Before this flare up, he was having about 50% pain relief.       Johnny [name] denies any recent fevers, chills, infection, antibiotics, bowel or bladder incontinence, saddle anesthesia. +Warfarin for a. fib.  Recent diagnosis of Parkinson's      PRIOR MEDICATIONS:   Effective  Acetaminophen  (little)    Ineffective  Gabapentin   Tizanidine       Unable to tolerate  NSAID    Never  Lyrica  Ami/Nortriptyline  Cymbalta      PRIOR INTERVENTIONS:  L-spine surgery with hardware L2-3 L3-4 and then later L4-5 and L5-1 next (2 surgeries, most recent in 2008) Right TKA with revision  Effective  Bilateral SIJ (OSH, good benefit)  Left L5-S2 TFESI  right knee RFA - genicular  bilateral L4-L5 - transient      Ineffective  ESI x3 (OSH)  Lumbar RFA      Past Medical History:  Past Medical History:    Abnormal involuntary movement    ADHD (attention deficit hyperactivity disorder)    Atrial fibrillation (CMS-HCC)    Back pain    Bleeding disorder    Bronchiectasis (CMS-HCC)    CAD (coronary artery disease)    Cardiac dysrhythmia    Carpal tunnel syndrome    Cataract    Chronic bronchitis (CMS-HCC)    Chronic cough    Congestive heart disease (CMS-HCC)    Constipation    COPD (chronic obstructive pulmonary disease) (CMS-HCC)    Degenerative disc disease, lumbar    Erectile dysfunction, vasculogenic  Fracture    GERD (gastroesophageal reflux disease)    Hearing reduced    Heart attack (CMS-HCC)    Hiatal hernia    HX: anticoagulation    Hyperlipemia    Hypertension    ICD (implantable cardiac defibrillator) in place    Ischemic cardiomyopathy    Joint pain    Kidney disease    Memory loss    Mitral regurgitation    Movement disorder    Myasthenia gravis (CMS-HCC)    Myocardial infarction (CMS-HCC)    Obesity    Obesity, Class I, 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)    OSA on CPAP    Osteoarthritis    Other dysphagia    Parkinson disease (CMS-HCC)    Pre-diabetes    Pulmonary emphysema (CMS-HCC)    Pulmonary fibrosis (CMS-HCC)    S/P ICD (internal cardiac defibrillator) procedure    Seasonal allergic reaction    Seasonal allergies    Spinal stenosis    Ulcer    Unspecified sinusitis (chronic)       Family History:  Family History   Problem Relation Name Age of Onset    Cancer Father Johnny [name]     Heart problem Father Johnny [name]     Heart Disease Father Johnny [name]     Hypertension Mother Johnny [name]     Joint Pain Mother Johnny [name]     Cancer Mother Johnny [name]     Cancer Sister Johnny [name]     Arthritis Mother Johnny [name]    Allergy-severe Mother Johnny [name]     Diabetes Maternal Grandmother Johnny [name]         35       Social History:  Lives in [place] [id number] (1.25 hours away)    Social History     Socioeconomic History    Marital status: Married     Spouse name: Johnny [name]    Number of children: 4   Tobacco Use    Smoking status: Never    Smokeless tobacco: Never   Vaping Use    Vaping status: Never Used   Substance and Sexual Activity    Alcohol use: Not Currently     Comment: very rarely 4 drinks per year    Drug use: Never    Sexual activity: Yes     Partners: Female     Birth control/protection: Surgical       Allergies:  Allergies   Allergen Reactions    Clarithromycin RASH     [place]den and burns both ends of GI tract    Ketoconazole SEE COMMENTS     Had bloodshot eyes after use, and sore.  Happens if he gets drug in his eyes.  Tolerates shampoo fine.    Lisinopril COUGH    Oxycodone  UNKNOWN     Nightmares  Tolerates Tramadol        Medications:    Current Outpatient Medications:     albuterol  (VENTOLIN  HFA, PROAIR  HFA) 90 mcg/actuation inhaler, Inhale two puffs by mouth into the lungs four times daily as needed for Wheezing., Disp: , Rfl:     aspirin  81 mg chewable tablet, Chew one tablet by mouth at bedtime daily., Disp: , Rfl:     carbidopa -levodopa  (SINEMET ) 25-100 mg tablet, Take two tablets by mouth three times daily., Disp: 540 tablet, Rfl: 3    cetirizine  (ZYRTEC ) 10 mg tablet, Take one tablet by mouth daily., Disp: , Rfl:     cholecalciferol (Vitamin D3) (VITAMIN D-3) 1,000 units tablet,  Take one tablet by mouth twice daily.  , Disp: , Rfl:     coenzyme Q10 200 mg capsule, Take one capsule by mouth daily., Disp: , Rfl:     cyanocobalamin (vitamin B-12) 500 mcg tablet, Take one tablet by mouth daily., Disp: , Rfl:     cyclobenzaprine  (FLEXERIL ) 5 mg tablet, May take one tablet by mouth twice daily as needed. May also take two tablets at bedtime as needed., Disp: 120 tablet, Rfl: 3    ECHINACEA PO, Take 1 capsule by mouth daily., Disp: , Rfl:     ezetimibe  (ZETIA ) 10 mg tablet, Take one tablet by mouth at bedtime daily., Disp: 90 tablet, Rfl: 3    famotidine  (PEPCID ) 40 mg tablet, TAKE 1 TABLET BY MOUTH EVERY DAY, Disp: 90 tablet, Rfl: 0    fesoterodine  ER (TOVIAZ ) 4 mg tablet, Take one tablet by mouth daily. Do not cut/ crush/ chew, Disp: 90 tablet, Rfl: 0    fluticasone -umeclidin-vilanter (TRELEGY ELLIPTA) 100-62.5-25 mcg inhaler, Inhale 1 Dose by mouth into the lungs daily after dinner., Disp: , Rfl:     furosemide  (LASIX ) 40 mg tablet, Take 2 tabs on Mon-Wed-Fri  Take 3 tabs on Sun-Tues-Thur-Sat, Disp: 226 tablet, Rfl: 2    gabapentin  (NEURONTIN ) 300 mg capsule, TAKE 1 CAPSULE BY MOUTH THREE TIMES DAILY (Patient taking differently: Take one capsule by mouth twice daily.), Disp: 270 capsule, Rfl: 3    glucosamine su 2KCl-chondroit 500-400 mg tab, Take 1 Tab by mouth twice daily., Disp: , Rfl:     ipratropium bromide  (ATROVENT ) 42 mcg (0.06 %) nasal spray, Apply two sprays to each nostril as directed twice daily as needed., Disp: , Rfl:     ketoconazole (NIZORAL) 2 % topical shampoo, Apply  topically to affected area every 7 days. Apply topically to affected area of damp skin, lather, leave on 5 minutes, and rinse., Disp: , Rfl:     losartan  (COZAAR ) 50 mg tablet, TAKE 1 TABLET BY MOUTH EVERY DAY, Disp: 90 tablet, Rfl: 3    metoprolol  succinate XL (TOPROL  XL) 100 mg extended release tablet, TAKE 1 TABLET BY MOUTH EVERY DAY, Disp: 90 tablet, Rfl: 3    mucus clearing device (AEROBIKA OSCILLATING PEP SYSTM MISC), Use  as directed. Use with salt water  and inhale twice daily, Disp: , Rfl:     omeprazole  DR (PRILOSEC) 40 mg capsule, TAKE 1 CAPSULE BY MOUTH TWICE DAILY, Disp: 180 capsule, Rfl: 0    other medication, one Dose. Mupirocin dissolved in warm water  and Budesonide in NS  qhs  (sometimes increases to BID), Disp: , Rfl:     polyethylene glycol 3350  (MIRALAX ) 17 g packet, Take one packet by mouth daily as needed., Disp: , Rfl:     potassium chloride  SR (K-DUR) 20 mEq tablet, TAKE 2 TABLETS BY MOUTH ON MONDAY, WEDNESDAY AND FRIDAY AND 1 TABLET ALL OTHER DAYS, Disp: 140 tablet, Rfl: 2    predniSONE (DELTASONE) 5 mg tablet, , Disp: , Rfl:     pyRIDostigmine bromide (MESTINON) 60 mg tablet, Take two tablets by mouth three times daily.  , Disp: , Rfl:     rosuvastatin  (CRESTOR ) 40 mg tablet, TAKE 1 TABLET BY MOUTH AT BEDTIME, Disp: 90 tablet, Rfl: 3    spironolactone  (ALDACTONE ) 25 mg tablet, TAKE 1 TABLET BY MOUTH EVERY DAY WITH FOOD, Disp: 90 tablet, Rfl: 3    tamsulosin  (FLOMAX ) 0.4 mg capsule, Take one capsule by mouth daily., Disp: , Rfl:     testosterone  cypionate 200 mg/mL kit, Inject 1 mL into the muscle every 14 days., Disp: , Rfl:     traZODone (DESYREL) 100 mg tablet, Take one tablet by mouth at bedtime daily., Disp: , Rfl:     Vacuum Erection Device System kit, Use as directed for sexual activity., Disp: 1 kit, Rfl: 11    warfarin (COUMADIN ) 3 mg tablet, Take one tablet by mouth at bedtime daily., Disp: , Rfl:     Physical examination:   BP 128/63 (BP Source: Arm, Right Upper, Patient Position: Sitting)  - Pulse 86  - Ht 180.3 cm (5' 11)  - Wt 98 kg (216 lb)  - SpO2 98%  - BMI 30.13 kg/m?   Pain Score: Six    Physical Exam:  General: The patient is a well-developed, well nourished 78 y.o. male in no acute distress.   HEENT: Head is normocephalic and atraumatic.   Extremities: No ankle or wrist edema.     Neurologic:   The patient is alert and oriented times 3.     Musculoskeletal:   Gait is antalgic - cane    Lumbar spine:  Mod low lumbar TTP.   Increased tone.   +facet loading  SLR is negative in the BLE.           Results for orders placed during the hospital encounter of 08/24/20    MRI L-SPINE WO/W CONTRAST    Addendum 08/25/2020  5:39 PM  Finalized by Johnny [name], M.D. on 08/24/2020 5:00 PM. Dictated by Johnny [name], M.D. on 08/24/2020 3:54 PM.Addendum:  Johnny [name] discussed these findings via telephone with Dr. Colen nurse, Johnny [name], at 8:34 AM on 08/25/2020.      Approved by Johnny [name], M.D. on 08/25/2020 8:35 AM    By my electronic signature, I attest that I have personally reviewed the images for this examination and formulated the interpretations and opinions expressed in this report      Finalized by Johnny [name], M.D. on 08/25/2020 5:36 PM. Dictated by Johnny [name], M.D. on 08/25/2020 8:33 AM.    Narrative  EXAM: MRI L-SPINE    HISTORY:    , lumbar radiculapathy,    Technique: Multiple sagittal and axial MR sequences were obtained of the lumbar spine with and without MultiHance  contrast.    Comparison: CT lumbar spine August 27, 2014    FINDINGS:    There are 5 nonrib-bearing lumbar type vertebral bodies. There is left convexity lumbar spinal curvature. Trace retrolisthesis at L2-L3 and trace anterolisthesis of L5-S1. X-Stop devices are seen at the spinous processes of the lumbar spine. Vertebral body heights are maintained. Multilevel vertebral endplate marrow changes. No suspicious bone marrow replacing lesion. The conus is normal in appearance and position at the T12-L1 level. abnormal enhancing lesion is identified. Bilateral renal atrophy. Small upper pole right renal cyst. There is an additional 1.3 cm upper pole right renal lesion which demonstrates T2 and T1 hypointensity (series 5, image 8).    T12-L1: No significant central spinal or neuroforaminal stenosis.    L1-L2: Marked disc degeneration, mild circumferential disc bulge, and facet hypertrophy. Mild central spinal stenosis. Moderate left and marked right neural foraminal stenosis.    L2-L3: Trace retrolisthesis, moderate disc degeneration, circumferential disc bulge, facet hypertrophy, and mild malignant flavum thickening. Mild central spinal stenosis. Moderate left and marked right neuroforaminal stenosis.    L3-L4: Moderate disc degeneration, circumferential disc bulge, and facet hypertrophy. Moderate to marked spinal stenosis. Marked bilateral neuroforaminal stenosis.  L4-L5: Mild disc degeneration, circumferential disc bulge with superimposed central disc protrusion, ligamentum flavum thickening, and facet hypertrophy. Moderate to marked central spinal stenosis. Marked bilateral neural foraminal stenosis.    L5-S1: Mild disc degeneration, circumferential disc bulge, and facet hypertrophy. Moderate central spinal stenosis. Marked bilateral neural foraminal stenosis.    Impression  1.  Multilevel degenerative central spinal stenosis, greatest of moderate to marked degree at L3-L4, L4-L5, and L5-S1.  2.  Multilevel degenerative neuroforaminal stenosis, greatest of marked degree on the right at L1-L2 and L2-L3 as well as bilaterally at L3-L4, L4-5, and L5-S1.  3.  Left convexity lumbar spinal curvature.  4.  Trace retrolisthesis of L2-L3 and trace anterolisthesis of L5-S1.  5.  T2 and T1 hypointense peripheral right renal lesion. MRI abdomen (renal mass protocol) is recommended for further evaluation.              Last Cr and LFT's:  Creatinine   Date Value Ref Range Status   03/31/2021 1.30 (H) 0.4 - 1.24 MG/DL Final     AST (SGOT)   Date Value Ref Range Status   11/22/2018 25 7 - 40 U/L Final     ALT (SGPT)   Date Value Ref Range Status   11/22/2018 24 7 - 56 U/L Final     Alk Phosphatase   Date Value Ref Range Status   11/22/2018 51 25 - 110 U/L Final     Total Bilirubin   Date Value Ref Range Status   11/22/2018 0.8 0.3 - 1.2 MG/DL Final          Assessment:    Lenix Kidd is a 78 y.o. male who  has a past medical history of Abnormal involuntary movement (1994), ADHD (attention deficit hyperactivity disorder), Atrial fibrillation (CMS-HCC) (02/08/2009), Back pain, Bleeding disorder (when started Xarelto ), Bronchiectasis (CMS-HCC), CAD (coronary artery disease) (02/08/2009), Cardiac dysrhythmia (2007), Carpal tunnel syndrome, Cataract (2017), Chronic bronchitis (CMS-HCC), Chronic cough, Congestive heart disease (CMS-HCC), Constipation (2015), COPD (chronic obstructive pulmonary disease) (CMS-HCC), Degenerative disc disease, lumbar (2010), Erectile dysfunction, vasculogenic, Fracture, GERD (gastroesophageal reflux disease), Hearing reduced (2015), Heart attack (CMS-HCC) (possible in 2006), Hiatal hernia, anticoagulation, Hyperlipemia (02/11/2009), Hypertension (02/11/2009), 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), 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), Joint pain (1966), Kidney disease (2019), Memory loss (this year mostly), Mitral regurgitation, Movement disorder (2019), Myasthenia gravis (CMS-HCC) (2023 - father also had MG), Myocardial infarction (CMS-HCC) (2006), Obesity, Obesity, Class I, 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), OSA on CPAP (02/11/2009), Osteoarthritis, Other dysphagia (years ago), Parkinson disease (CMS-HCC) (2022), Pre-diabetes, Pulmonary emphysema (CMS-HCC), Pulmonary fibrosis (CMS-HCC), S/P ICD (internal cardiac defibrillator) procedure (02/11/2009), Seasonal allergic reaction (1960), Seasonal allergies, Spinal stenosis (2010), Ulcer, and Unspecified sinusitis (chronic) (2010). who presents for evaluation of pain.    The pain complaints are most likely due to:    1. Lumbosacral radiculopathy 2. Neuropathic pain        3. Spinal cord stimulator status        4. Peripheral nerve neurostimulator device in situ        5. Chronic pain of right knee        6. Mononeuropathy            Patient has had an adequate trial of > 3 months of rest, exercise, multimodal treatment, and the passage of time without improvement of symptoms. The pain has significant impact on the daily quality of life.     Plan:    Will optimize Nalu and Nevro stimulator  programming today.   Plan for Right L3-5 TFESI in PROCEDURES.   This patient's clinical history, exam, AND imaging support radiculopathy AND there is a significant impact on quality of life and function AND the pain has been present for at least 4 weeks AND they have failed to improve with noninvasive conservative care.     This patient had at least 50% pain relief for at least 3 months with the last epidural injection.     3.  Can increase flexeril  to 10 tid from 01/21/09. New Rx sent.   4.  Will check thoracolumbar XR today to check SCS leads. Nalu leads not being checked as he has excellent coverage on all contacts at the knee.   5.  Follow up as needed.     Risks/benefits of all pharmacologic and interventional treatments discussed and questions answered.

## 2024-06-13 ENCOUNTER — Encounter: Admit: 2024-06-13 | Discharge: 2024-06-13 | Payer: MEDICARE

## 2024-06-13 NOTE — Telephone Encounter
 Pt left VM saying he doesn't feel like he's getting better after the injection he feels like he's getting worse. Pt said it hurts for him to talk and he's not trying to say more than he needs to, kinda sounds like no volume. Advised Pt of appt 10/10 and told him I would leave a chart note for his nurse and they would reach out if needed

## 2024-06-20 ENCOUNTER — Encounter: Admit: 2024-06-20 | Discharge: 2024-06-20 | Payer: MEDICARE

## 2024-06-21 ENCOUNTER — Encounter: Admit: 2024-06-21 | Discharge: 2024-06-21 | Payer: MEDICARE

## 2024-06-27 ENCOUNTER — Encounter: Admit: 2024-06-27 | Discharge: 2024-06-27 | Payer: MEDICARE

## 2024-06-27 DIAGNOSIS — Z006 Encounter for examination for normal comparison and control in clinical research program: Principal | ICD-10-CM

## 2024-06-27 DIAGNOSIS — I480 Paroxysmal atrial fibrillation: Secondary | ICD-10-CM

## 2024-06-27 NOTE — Progress Notes
 [facility]  NO CHARGE NOTE  Staff: Gustav Kent, M.D.  Date: 06/28/24    Johnny [name] presented late for today's appointment due to limitations in transportation/scheduling. Brief stimulability testing was completed in the limited time; he was not stimulable for improved voicing this date, in contrast to stimulability testing completed at his previous appointment. He will be rescheduled for a future date.     NO BILLABLE SERVICES PROVIDED

## 2024-06-28 ENCOUNTER — Ambulatory Visit: Admit: 2024-06-28 | Discharge: 2024-06-28 | Payer: MEDICARE

## 2024-06-28 ENCOUNTER — Ambulatory Visit: Admit: 2024-06-28 | Discharge: 2024-06-29 | Payer: MEDICARE

## 2024-06-28 ENCOUNTER — Encounter: Admit: 2024-06-28 | Discharge: 2024-06-28 | Payer: MEDICARE

## 2024-06-29 ENCOUNTER — Encounter: Admit: 2024-06-29 | Discharge: 2024-06-29 | Payer: MEDICARE

## 2024-07-01 ENCOUNTER — Encounter: Admit: 2024-07-01 | Discharge: 2024-07-01 | Payer: MEDICARE

## 2024-07-01 ENCOUNTER — Ambulatory Visit: Admit: 2024-07-01 | Discharge: 2024-07-02 | Payer: MEDICARE

## 2024-07-01 DIAGNOSIS — I480 Paroxysmal atrial fibrillation: Principal | ICD-10-CM

## 2024-07-01 DIAGNOSIS — I255 Ischemic cardiomyopathy: Secondary | ICD-10-CM

## 2024-07-01 DIAGNOSIS — E7800 Pure hypercholesterolemia, unspecified: Secondary | ICD-10-CM

## 2024-07-01 DIAGNOSIS — I10 Essential (primary) hypertension: Secondary | ICD-10-CM

## 2024-07-01 NOTE — Telephone Encounter
 Patient's identity was verified using two patient identifiers (name an date of birth).    Called patient to schedule Amulet implant with Dr. Guillermina HERO. Johnny [name]  Patient agreed to date of service, 07/08/2024  Pre procedure appointments discussed.  Patient verbalized understanding of dates/times/locations of all appointments.  Pre procedure instructions sent via MyChart per request.      Same Day Discharge pre procedure screening for [facility] case:    Exclusion Criteria:  []  Planned Admission for other reasons (i.e. Tikosyn /Sotalol loading)  []  LVEF <40%  []  Pulmonary disease requiring O2 use  []  Thrombocytopenia <100,000  []  BMI >40 kg/m2  If any one of the above boxes is checked the patient is NOT eligible for same day discharge at this time    Inclusion Criteria:  [x]  No recent bleeding history (</= 1 month)  [x]  LVEF >/= 40%  [x]  No pulmonary disease requiring O2 use  [x]  BMI </= 40 kg/m2  [x]  Has a home <=/ 2 hours away from [facility]  [x]  A caregiver is available to stay with patient overnight  [x]  Has a working phone  [x]  Reliable transportation  [x]  Reliable for follow-up next day  ALL boxes in the inclusion criteria section must be check for patient to be eligible for same day discharge at this time    Patient is eligible for same day discharge at this time?  Yes

## 2024-07-01 NOTE — Patient Instructions
 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 Wynona HERO. Reddy.      ARRIVAL TIME:  Please report to the [facility] admitting office on the ground floor of the [facility] on: 07/08/2024    The [facility] is located at [address], [place], [place]  [address]. Park in [employer] and enter through the  Main front doors to the hospital. The Heart Center is located on the right-hand side as soon as you walk in.    The EP Lab will call to notify you of your arrival time.  They will call on the business day prior to your procedure.  (If you have any questions regarding your arrival time for the Electrophysiology Lab, please call the EP Lab at [phone].)      PRE-PROCEDURE APPOINTMENTS:  07/05/2024 at 11:00 am Office visit to obtain or update history and physical (required within 30 days of procedure) with Heron Ruth APRN at the following Central Cardiology clinic location: [facility] the [facility]: [address], [place]  [place], [address]     07/05/2024 at 12:40 pm   Pre-Operative Assessment Clinic visit with Anesthesia Department located at [facility], front entrance of the main hospital to the left across from the pharmacy and proceed to the clinic entrance.      07/05/2024   Pre-Admission lab work: BMP, CBC, Magnesium , and INR at your Pre-Operative Assessment Visit.        SPECIAL MEDICATION INSTRUCTIONS  If not taking oral or injectable GLP-1 medication: Nothing to eat or drink after 11p.m. the evening before your procedure. Take your prescription medications with a sip of water  as instructed. No caffeine for 24 hours prior to your procedure.     Any new prescriptions will be sent to your pharmacy listed on file with us .     HOLD ALL over the counter vitamins or supplements on the morning of your procedure.     Diuretics: Aldactone  (spironolactone ) -- hold the morning of your procedure.  and Lasix  (furosemide ) -- hold the morning of your procedure.   ACE/ARBs: Cozaar  (losartan ) -- hold the morning of your procedure.   Other: Aspirin  -- hold the morning of your procedure.     WARFARIN INSTRUCTIONS:  Per the [employer] study for which you are enrolled, you will hold your Warfarin for 48 hours prior to procedure.  Your last dose will be on Friday 07/05/2024.           Additional Instructions  If you wear CPAP, please bring your mask and machine with you to the hospital.    Take a bath or shower with anti-bacterial soap the evening before, or the morning of the procedure. We will give this to you at your office visit.     Bring photo ID and your health insurance card(s).    Arrange for a driver to take you home from the hospital.    Bring an accurate list of your current medications with you to the hospital (all meds and supplements taken daily).    Wear comfortable clothes and don't bring valuables, other than photo identification card, with you to the hospital.    Please pack a bag for an overnight stay.     For patients  who are staying overnight on the Cardiovascular Treatment and Recovery Unit (CTR), no visitor(s) will be allowed to sleep at the bedside.    Please review your pre-procedure instructions and call the office at [phone] with any questions. You may ask to speak with any of Dr. Wynona HERO. Reddy's nurses. There are several of us  in the office that can assist you. 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     ALLERGIES  Allergies   Allergen Reactions    Clarithromycin RASH     Redden and burns both ends of GI tract    Ketoconazole SEE COMMENTS     Had bloodshot eyes after use, and sore.  Happens if he gets drug in his eyes.  Tolerates shampoo fine.    Lisinopril COUGH    Oxycodone  UNKNOWN     Nightmares  Tolerates Tramadol        CURRENT MEDICATIONS  Outpatient Encounter Medications as of 07/01/2024   Medication Sig Dispense Refill    albuterol  (VENTOLIN  HFA, PROAIR  HFA) 90 mcg/actuation inhaler Inhale two puffs by mouth into the lungs four times daily as needed for Wheezing.      aspirin  81 mg chewable tablet Chew one tablet by mouth at bedtime daily.      carbidopa -levodopa  (SINEMET ) 25-100 mg tablet Take two tablets by mouth three times daily. 540 tablet 3    cetirizine  (ZYRTEC ) 10 mg tablet Take one tablet by mouth daily.      cholecalciferol (Vitamin D3) (VITAMIN D-3) 1,000 units tablet Take one tablet by mouth twice daily.        coenzyme Q10 200 mg capsule Take one capsule by mouth daily.      cyanocobalamin (vitamin B-12) 500 mcg tablet Take one tablet by mouth daily.      cyclobenzaprine  (FLEXERIL ) 10 mg tablet Take one tablet by mouth three times daily as needed for Muscle Cramps. 90 tablet 3    ECHINACEA PO Take 1 capsule by mouth daily.      ezetimibe  (ZETIA ) 10 mg tablet Take one tablet by mouth at bedtime daily. 90 tablet 3    famotidine  (PEPCID ) 40 mg tablet TAKE 1 TABLET BY MOUTH EVERY DAY 90 tablet 0    fesoterodine  ER (TOVIAZ ) 4 mg tablet Take one tablet by mouth daily. Do not cut/ crush/ chew 90 tablet 0    fluticasone -umeclidin-vilanter (TRELEGY ELLIPTA) 100-62.5-25 mcg inhaler Inhale 1 Dose by mouth into the lungs daily after dinner.      furosemide  (LASIX ) 40 mg tablet Take 2 tabs on Mon-Wed-Fri  Take 3 tabs on Sun-Tues-Thur-Sat 226 tablet 2    gabapentin  (NEURONTIN ) 300 mg capsule Take one capsule by mouth three times daily. 270 capsule 3    glucosamine su 2KCl-chondroit 500-400 mg tab Take 1 Tab by mouth twice daily.      ipratropium bromide  (ATROVENT ) 42 mcg (0.06 %) nasal spray Apply two sprays to each nostril as directed twice daily as needed.      ketoconazole (NIZORAL) 2 % topical shampoo Apply  topically to affected area every 7 days. Apply topically to affected area of damp skin, lather, leave on 5 minutes, and rinse.      losartan  (COZAAR ) 50 mg tablet TAKE 1 TABLET BY MOUTH EVERY DAY 90 tablet 3    metoprolol  succinate XL (TOPROL  XL) 100 mg extended release tablet TAKE 1 TABLET BY MOUTH  EVERY DAY 90 tablet 3    mucus clearing device (AEROBIKA OSCILLATING PEP SYSTM MISC) Use  as directed. Use with salt water  and inhale twice daily      omeprazole  DR (PRILOSEC) 40 mg capsule TAKE 1 CAPSULE BY MOUTH TWICE DAILY 180 capsule 0    other medication one Dose. Mupirocin dissolved in warm water  and Budesonide in NS  qhs  (sometimes increases to BID)      polyethylene glycol 3350  (MIRALAX ) 17 g packet Take one packet by mouth daily as needed.      potassium chloride  SR (K-DUR) 20 mEq tablet TAKE 2 TABLETS BY MOUTH ON MONDAY, WEDNESDAY AND FRIDAY AND 1 TABLET ALL OTHER DAYS 140 tablet 2    predniSONE (DELTASONE) 5 mg tablet       pyRIDostigmine bromide (MESTINON) 60 mg tablet Take two tablets by mouth three times daily.        rosuvastatin  (CRESTOR ) 40 mg tablet TAKE 1 TABLET BY MOUTH AT BEDTIME 90 tablet 3    spironolactone  (ALDACTONE ) 25 mg tablet TAKE 1 TABLET BY MOUTH EVERY DAY WITH FOOD 90 tablet 3    tamsulosin  (FLOMAX ) 0.4 mg capsule Take one capsule by mouth daily.      testosterone cypionate 200 mg/mL kit Inject 1 mL into the muscle every 14 days.      traZODone (DESYREL) 100 mg tablet Take one tablet by mouth at bedtime daily.      Vacuum Erection Device System kit Use as directed for sexual activity. 1 kit 11    warfarin (COUMADIN ) 3 mg tablet Take one tablet by mouth at bedtime daily.       No facility-administered encounter medications on file as of 07/01/2024.       IMPORTANT INFORMATION:    VISITORS:  Per the current visitor policy for the Cardiovascular Labs, patients are allowed 1 visitor only in the pre/post rooms and no visitors are allowed to stay overnight with the patient. There are several hotels near the hospital if you need to arrange for accommodations for your family or friend providing support and transportation home after your procedure.     FOLLOW UP APPTS:  There are several follow up appointments throughout the first year after implantation of this device.  I will schedule your initial follow up appointments once I am able to read the operative notes the morning after your procedure.  I will call you a week after your procedure to check on you and review the initial follow up appointments and instructions.  I will contact you to schedule any remaining appointments as they become available.      3 month post Amulet office visit and TEE (transesophageal echocardiogram)  10/09/2024 at 09:00 am   Office Visit with Rhoda Medin APRN at the [facility]:  [address], [place] [address]   10/09/2024 at 09:30 am     (arrival time) TEE (Transesophageal Echocardiogram)  [address]  [place]  [place], [place] [address]  THIS LOCATION IS NOT IN THE HEART CENTER - IT IS ON THE RIGHT SIDE OF THE LOBBY ONCE YOU ENTER THE HOSPITAL.     TEE Instructions:  Nothing to eat or drink after 11 pm the night before your procedure.    You must have a designated person who will be driving you home and staying with you after the procedure.    Please take all prescribed medications the morning of your procedure with a sip of water   unless otherwise instructed by your nurse.    HOLD all vitamins, supplements and diuretics (LASIX  and SPIRONOLACTONE ) the morning of your procedure.      If you have any questions, please call the Procedure RNs office at [phone]     6 month post Amulet office visit:  01/13/2025 at 10:30 am Office Visit with Dr. Laurian at the [facility]:  [address], [place]  [place]  [address].     There will be a one year post implant office visit and TEE that we will schedule when able.       Also, please remember: You MAY NEED to take antibiotics prior to any dental procedures (including cleaning) for 6 months after your device placement.  This will help to prevent infection to the device.  Contact your primary care provider or cardiologist for an antibiotic prescription to be taken prior to any dental procedures.   _________________________________________  Form completed by: Corean Leventhal, BSN  Date completed: 07/01/24  Method: Via MyChart.    Please let me know if you have any questions or concerns.    Thanks so much,  Corean Leventhal, BSN, RN, [facility]- Nurse Navigator   [facility]  Cardiac Navigation  Phone: [phone]- [email or web address]  [address], [place], [place]  [address]

## 2024-07-02 ENCOUNTER — Encounter: Admit: 2024-07-02 | Discharge: 2024-07-02 | Payer: MEDICARE

## 2024-07-02 ENCOUNTER — Ambulatory Visit: Admit: 2024-07-02 | Discharge: 2024-07-02 | Payer: MEDICARE

## 2024-07-02 MED ORDER — ROSUVASTATIN 40 MG PO TAB
40 mg | ORAL_TABLET | Freq: Every evening | ORAL | 2 refills
Start: 2024-07-02 — End: ?

## 2024-07-02 MED ORDER — POTASSIUM CHLORIDE 20 MEQ PO TBTQ
ORAL_TABLET | 1 refills | 30.00000 days | Status: AC
Start: 2024-07-02 — End: ?

## 2024-07-03 ENCOUNTER — Encounter: Admit: 2024-07-03 | Discharge: 2024-07-03 | Payer: MEDICARE

## 2024-07-04 ENCOUNTER — Inpatient Hospital Stay: Admit: 2024-07-04 | Discharge: 2024-07-04 | Payer: MEDICARE

## 2024-07-05 ENCOUNTER — Encounter: Admit: 2024-07-05 | Discharge: 2024-07-05 | Payer: MEDICARE

## 2024-07-05 ENCOUNTER — Ambulatory Visit: Admit: 2024-07-05 | Discharge: 2024-07-06 | Payer: MEDICARE

## 2024-07-05 ENCOUNTER — Ambulatory Visit: Admit: 2024-07-05 | Discharge: 2024-07-05 | Payer: MEDICARE

## 2024-07-05 NOTE — Telephone Encounter
 Received message from [facility].    Hi Caroline Longie, We had a patient of Dr. Reddy's in the [facility] today, [place], MRN: [id number], DOS: 10/20. He had his preprocedure labs drawn on 10/13 and his INR was below therapeutic. He wants to confirm what the plan is for this. They have recently left the clinic. Are you able to phone him to discuss?      Called and spoke to the patient and his wife. He will adjust his warfarin accordingly given the low INR. Let him know he has a TEE scheduled with his procedure and explained that will look for a thrombus. Told him from our end no concerns and to increase her warfarin as instructed by managing provider    Reviewed plan with the patient. Patient verbalized understanding and does not have any further questions or concerns. No further education requested from patient. Patient has our contact information for future needs.

## 2024-07-06 NOTE — H&P [4]
 History and Physical Update Note    Allergies:  Clarithromycin, Oxycodone , Ketoconazole, and Lisinopril    Lab/Radiology/Other Diagnostic Tests:  Hematology:    Lab Results   Component Value Date    HGB 14.2 07/01/2024    HGB 14.7 06/09/2020    HCT 43.0 07/01/2024    HCT 47.1 06/09/2020    PLTCT 191 07/01/2024    PLTCT 164 06/09/2020    WBC 9.60 07/01/2024    WBC 14.9 06/09/2020    NEUT 92 11/22/2018    ANC 13.00 11/22/2018    ALC 0.70 11/22/2018    MONA 3 11/22/2018    AMC 0.50 11/22/2018    EOSA 0 11/22/2018    ABC 0.00 11/22/2018    MCV 82.9 07/01/2024    MCV 84.6 06/09/2020    MCH 27.3 07/01/2024    MCH 26.4 06/09/2020    MCHC 32.9 07/01/2024    MCHC 31.2 06/09/2020    MPV 7.7 07/01/2024    MPV 8.0 11/23/2018    RDW 20.3 07/01/2024    RDW 16.4 06/09/2020   , Coagulation:    Lab Results   Component Value Date    PT 16.5 07/08/2024    PT 17.0 05/18/2023    PTT 31.0 08/29/2014    INR 1.5 07/08/2024    INR 1.6 05/18/2023    INR 1.6 08/24/2018   , General Chemistry:    Lab Results   Component Value Date    NA 140 07/01/2024    NA 139 03/31/2021    K 4.1 07/01/2024    K 4.0 03/31/2021    CL 103 07/01/2024    CL 100 03/31/2021    CO2 28 07/01/2024    CO2 29 03/31/2021    GAP 9 07/01/2024    GAP 10 03/31/2021    BUN 27 07/01/2024    BUN 33 03/31/2021    CR 1.07 07/01/2024    CR 1.30 03/31/2021    GLU 103 07/01/2024    GLU 102 03/31/2021    GLU 123 03/15/2006    CA 9.3 07/01/2024    CA 8.7 03/31/2021    ALBUMIN 4.0 07/08/2024    ALBUMIN 3.2 11/22/2018    OBSCA 1.08 04/15/2005    MG 2.2 07/01/2024    MG 1.9 07/10/2019    TOTBILI 0.8 11/22/2018    PO4 3.0 12/14/2009     Point of Care Testing:  (Last 24 hours):         I have examined the patient, and there are no significant changes in their condition, from the previous H&P performed on 07/05/2024.    REASON FOR ADMISSION: LAA occlusion with Amulet device    Aureliano Ellen, MD  Electrophysiology Fellow, PGY-7  Pager: [phone]    --------------------------------------------------------------------------------------------------------------------------------------------  Jama Heron RAMAN, APRN-CNS (Nurse Practitioner)  Clinical Nurse Specialist, Adult Health  Creation Time: 07/05/24 1121  Signed  Expand All Collapse All       Cardiovascular Medicine      Date of Service: 07/05/2024     HPI  Subjective  I had the pleasure of seeing Johnny [name] at the [facility] today.   Johnny [name] is a 78 year old male here today for a History and Physical prior to a left atrial occlusion device placement Amulet - he is participating in the Catalyst study and is scheduled with Dr Jess on 07/08/24.       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  He also has Parkinsons and Myasthenia Gravis and uses two canes to ambulate.  He was at risk for falls and was recommended for a left atrial occlusion device. He is on warfarin for anticoagulation.      Today we reviewed the procedure of the left atrial occlusion device placement.  I was able to answer his questions.  He notes that he had a vein harvest from his right leg from his CABG in 2006.  We also reviewed the pre procedure instructions and provided a printed copy.  He and his wife were familiar with the instructions.  We reviewed the post procedure recommendations also.  He is also meeting with the Pre Anesthesia Clinic and having labs drawn today.  He denies fever or new illness.  He lives in [place] .                    Vitals:     07/05/24 1110 07/05/24 1116   BP: 112/53     BP Source: Arm, Left Upper     Pulse: 94     SpO2: 96%     O2 Device:   CPAP/BiPAP   PainSc: Zero     Weight: 92.4 kg (203 lb 12.8 oz)     Height: 180.3 cm (5' 11)        Body mass index is 28.42 kg/m?SABRA      Past Medical History        Patient Active Problem List     Diagnosis Date Noted    Chronic systolic (congestive) heart failure (CMS-HCC) 02/21/2023    GERD (gastroesophageal reflux disease) 12/19/2022    Other constipation 12/19/2022    Urinary frequency      Urinary urgency      DOE (dyspnea on exertion) 03/16/2022    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  EF has improved but patient needs to continue HFrEF oriented medical therapy       VT (ventricular tachycardia) (CMS-HCC) 02/28/2018       02/19/2020 - Regadenoson MPI:  Small basal to mid inferolateral wall  fixed perfusion defec with corresponding hypokinesis on gated images suggestive of myocardial injury & small size, mild basal anterolateral wall  predominantly reversible perfusion defect  Corresponding regional wall motion is normal.  All myocardial segments appear viable.EF 46%.  High risk scintigraphic features are absent.  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 7/06 Rx Vytorin 10/40, then to 10/80 approx 04/28/05. Recalls knee pain w/ lovastatin  b. 08/03/05 total 156 trig 118 HDL 39 LDL 95  C. 02/09/09 total 148 trig 114  HDL 43  LDL 81 Vytorin 10/80 Fishoil  >>Starts Lipitor 80 + Zetia  March 2011       Sleep apnea 02/11/2009       Sleep apnea      a. 2/07 Using CPAP, good tolerance and benefit after Sleep study approx 2003 Dr. Naomia       Hypertension 02/11/2009       Hypertension        a. 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 7/06 Carotid US  mod disease < 50% sten       Severe MR treated with valvuloplasty 2006 02/11/2009       a.  04/14/05 #28 [place] ring, Resect P2 Seg Post leaflet,ring.as above w/ CABG-[place]       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       a. 04/14/05: CABG x 5 LIMA-LAD, L Rad-Dx, sSVG-RCA-RCA, sSVG-OM & MV Plasty for MR  with initial trigger for evaluation being murmur and dyspnea  b. 12/13/05 EF 35% by aden thall, Mixed defect in Cx distribution. Referred for ICD   c. Rash w/ Spiro, switch to Inspra  d. 6/07 Removal of all 8 sternal wires and parasternal lipoma d/t persisting pain  E. Jan, 2011 Regaden Smith. Limited inferior Defect, unchanged from 2007. EF 48%  F. 10/08/13: Reg/Thall Stress: EF 48% No ischemia or change from prior studies.    January 15, 2018  Lexiscan sestamibi study;  sinus rhythm, no arrhythmias or EKG changes    EF was 57% with normal wall motion and normal perfusion. [employer]        Atrial Fib Paroxysmal  (HCC) 02/08/2009       a. 7/06 Early post CAB Afib, Brief amio pre dismissal. Post DC AF still rapid, admit [place]  b. 05/23/05 TEE: No LA clot, EF 25% Cvert to SR on Ticosyn 500 BID, dec'd to 250 BID w/             QTC@ , QTc to 470. Home 05/26/05  c. 11/29/05 Ticosyn DC'd, Dr. Fayette p recurrent afib on ticosyn  D. 12/14 NSR on clinic exam, MAC OV (CBP)  E. 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 Maybell  12/17/13 Generator at ERI,  Fidelis RV-ICD lead extraction w/ laser assistance,Implant Medtronic Evera XT ICD and new RV lead. RA lead left i tact. DFTs done. Dr. Maylene               Review of Systems   Constitutional: Positive for malaise/fatigue.  All other systems reviewed and are negative.       Physical Exam:  General Appearance: no acute distress  Skin: warm & intact  Neck Veins: neck veins are flat & not distended  Carotid Arteries: no bruits  Chest Inspection: chest is normal in appearance  Auscultation/Percussion: lungs clear to auscultation, no rales, rhonchi, or wheezing  Cardiac Rhythm: regular rhythm & normal rate  Cardiac Auscultation: Normal S1 & S2.    Murmurs: no cardiac murmurs   Extremities: no lower extremity edema; 2+ symmetric distal pulses  Abdominal Exam: soft, non-tender, bowel sounds normal  Neurologic Exam: oriented to time, place and person; no focal neurologic deficits  Psychiatric: Normal mood and affect.  Behavior is normal. Judgment and thought content normal.          Cardiovascular Studies  Preliminary EKG from today:          Most recent results for 12-Lead ECG   ECG 12-LEAD     Collection Time: 05/30/24 11:07 AM   Result Value Status     VENTRICULAR RATE 86 Final     P-R INTERVAL 166 Final     QRS DURATION 160 Final     Q-T INTERVAL 404 Final     QTC CALCULATION (BAZETT) 483 Final     P AXIS 91 Final     R AXIS -14 Final     T AXIS 44 Final     Impression     Normal sinus rhythm  Right bundle branch block  Abnormal ECG  When compared with ECG of 21-Feb-2023 14:20,  No significant change was found  Confirmed by Ramirez, Rigoberto ([id number]) on 06/05/2024 1:53:00 AM      *Note: Due to a large number of results and/or encounters for the requested time period, some results have not been displayed. A complete set of results can be found in Results Review.            Cardiovascular Health Factors  Vitals     BP Readings from Last 3 Encounters:   07/05/24 108/63   07/05/24 112/53   07/02/24 101/81          Wt Readings from Last 3 Encounters:   07/05/24 92.4 kg (203 lb 9.6 oz)   07/05/24 92.4 kg (203 lb 12.8 oz)   07/02/24 90.7 kg (200 lb)          BMI Readings from Last 3 Encounters:   07/05/24 28.40 kg/m?   07/05/24 28.42 kg/m?   07/02/24 27.89 kg/m?      Smoking [Tobacco Use History]    [Tobacco Use History]      Tobacco Use   Smoking Status Never   Smokeless Tobacco Never      Lipid Profile       Cholesterol   Date Value Ref Range Status   06/09/2020 138   Final            HDL   Date Value Ref Range Status   06/09/2020 55   Final            LDL   Date Value Ref Range Status   06/09/2020 60   Final            Triglycerides   Date Value Ref Range Status   06/09/2020 117   Final      Blood Sugar       Hemoglobin A1C   Date Value Ref Range Status   09/23/2010 6.2  Glucose   Date Value Ref Range Status   07/01/2024 103 (H) 70 - 100 mg/dL Final   92/86/7977 897 (H) 70 - 100 MG/DL Final   95/94/7977 897 (H) 70 - 100 MG/DL Final            Glucose, POC   Date Value Ref Range Status   02/27/2023 130 (H) 70 - 100 MG/DL Final   93/89/7975 868 (H) 70 - 100 MG/DL Final   87/89/7984 875 (H) 70 - 100 MG/DL Final            Problems Addressed Today       Encounter Diagnoses   Name Primary?    Nonrheumatic mitral valve regurgitation Yes    Primary hypertension      Myasthenia gravis (CMS-HCC)      Obstructive sleep apnea syndrome           Assessment and Plan  Assessment & Plan  Johnny [name] is ready to proceed with left atrial occlusion device placement and is participating in the Catalyst study.  He shows understanding of the procedure, the indication and the pre procedure instructions.  I do not see any contraindications with him to proceed.            Heron GORMAN Ruth, APRN-CNS        Total Time Today was 30 minutes in the following activities: Preparing to see the patient, Obtaining and/or reviewing separately obtained history, and Performing a medically appropriate examination and/or evaluation        This note was partially dictated using Dragon Medical One speech recognition software.  Occasional wrong-word or sound-alike substitutions may have occurred due to the inherent limitations of voice-recognition software.  Please read the chart carefully and recognize, using context, where the substitutions may have occurred.  Please do not hesitate to contact me for clarification.            Current Medications (including today's revisions)   albuterol  (VENTOLIN  HFA, PROAIR  HFA) 90 mcg/actuation inhaler Inhale two puffs by mouth into the lungs four times daily as needed for Wheezing.    aspirin  81 mg chewable tablet Chew one tablet by mouth at bedtime daily.    carbidopa -levodopa  (SINEMET ) 25-100 mg tablet Take two tablets by mouth three times daily. (Patient taking differently: Take two tablets by mouth three times daily. Take 2 tablets in the morning and 1 tablet 4 more times through out the day)    cetirizine  (ZYRTEC ) 10 mg tablet Take one tablet by mouth daily.    cholecalciferol (Vitamin D3) (VITAMIN D-3) 1,000 units tablet Take one tablet by mouth twice daily.      coenzyme Q10 200 mg capsule Take one capsule by mouth daily.    cyanocobalamin (vitamin B-12) 500 mcg tablet Take one tablet by mouth daily.    cyclobenzaprine  (FLEXERIL ) 10 mg tablet Take one tablet by mouth three times daily as needed for Muscle Cramps.    dextran 70-hypromellose (PF) (ARTIFICIAL TEARS (PF)) 0.1/0.3 % ophthalmic solution Apply one drop to both eyes every 4 hours as needed for Dry Eyes.    ezetimibe  (ZETIA ) 10 mg tablet Take one tablet by mouth at bedtime daily.    famotidine  (PEPCID ) 40 mg tablet TAKE 1 TABLET BY MOUTH EVERY DAY (Patient taking differently: Take one tablet by mouth at bedtime daily.)    fesoterodine  ER (TOVIAZ ) 4 mg tablet Take one tablet by mouth daily. Do not cut/ crush/ chew    fluticasone -umeclidin-vilanter (TRELEGY ELLIPTA) 100-62.5-25 mcg  inhaler Inhale 1 Dose by mouth into the lungs daily after dinner.    furosemide  (LASIX ) 40 mg tablet Take 2 tabs on Mon-Wed-Fri  Take 3 tabs on Sun-Tues-Thur-Sat    gabapentin  (NEURONTIN ) 300 mg capsule Take one capsule by mouth three times daily.    glucosamine su 2KCl-chondroit 500-400 mg tab Take 1 Tab by mouth twice daily.    ketoconazole (NIZORAL) 2 % topical shampoo Apply  topically to affected area every 7 days. Apply topically to affected area of damp skin, lather, leave on 5 minutes, and rinse.    losartan  (COZAAR ) 50 mg tablet TAKE 1 TABLET BY MOUTH EVERY DAY (Patient taking differently: Take one tablet by mouth at bedtime daily.)    metoprolol  succinate XL (TOPROL  XL) 100 mg extended release tablet TAKE 1 TABLET BY MOUTH EVERY DAY    mucus clearing device (AEROBIKA OSCILLATING PEP SYSTM MISC) Use  as directed. Use with salt water  and inhale twice daily    omeprazole  DR (PRILOSEC) 40 mg capsule TAKE 1 CAPSULE BY MOUTH TWICE DAILY    other medication one Dose. Mupirocin dissolved in warm water  and Budesonide in NS  qhs  (sometimes increases to BID)    polyethylene glycol 3350  (MIRALAX ) 17 g packet Take one packet by mouth daily as needed.    potassium chloride  SR (K-DUR) 20 mEq tablet TAKE 2 TABLETS BY MOUTH MONDAY, WEDNESDAY AND FRIDAY AND TAKE 1 TABLET ALL OTHER DAYS (Patient taking differently: TAKE 1 TABLETS BY MOUTH MONDAY, WEDNESDAY AND FRIDAY AND TAKE 2 TABLETS ALL OTHER DAYS)    predniSONE (DELTASONE) 5 mg tablet Take one tablet by mouth daily with breakfast.    pyRIDostigmine bromide (MESTINON) 60 mg tablet Take two tablets by mouth three times daily. roflumilast (DALIRESP) 500 mcg tablet Take one tablet by mouth daily.    rosuvastatin  (CRESTOR ) 40 mg tablet TAKE 1 TABLET BY MOUTH AT BEDTIME    sodium chloride  (HYPER-SAL) 7 % nebulizer solution Inhale 4 mL solution by nebulizer as directed daily.    spironolactone  (ALDACTONE ) 25 mg tablet TAKE 1 TABLET BY MOUTH EVERY DAY WITH FOOD    tamsulosin  (FLOMAX ) 0.4 mg capsule Take one capsule by mouth daily.    testosterone cypionate 200 mg/mL kit Inject 1 mL into the muscle every 14 days.    traZODone (DESYREL) 100 mg tablet Take 1.5 tablets by mouth at bedtime daily.    Vacuum Erection Device System kit Use as directed for sexual activity.    warfarin (COUMADIN ) 3 mg tablet Take one tablet by mouth at bedtime daily.

## 2024-07-08 ENCOUNTER — Inpatient Hospital Stay: Admit: 2024-07-08 | Discharge: 2024-07-08 | Payer: MEDICARE

## 2024-07-08 ENCOUNTER — Encounter: Admit: 2024-07-08 | Discharge: 2024-07-08 | Payer: MEDICARE

## 2024-07-08 DIAGNOSIS — I480 Paroxysmal atrial fibrillation: Secondary | ICD-10-CM

## 2024-07-08 DIAGNOSIS — Z006 Encounter for examination for normal comparison and control in clinical research program: Principal | ICD-10-CM

## 2024-07-08 LAB — DEVICE EVALUATION - ICD (IMPLANTABLE CARDIOVERTER DEFIBRILLATOR)
EP A PACING%: 6.6 %
EP ATRIAL LEAD MODEL#: 407
EP GENERATOR IMPLANT DATE: 201
EP RV LEAD DIAPH. STIMULATION: 10
EP RV PACING%: 0.1 %

## 2024-07-08 LAB — ECG 12-LEAD
P AXIS: 98 degrees
P-R INTERVAL: 184 ms — ABNORMAL HIGH (ref 9.9–14.2)
Q-T INTERVAL: 440 ms
QRS DURATION: 170 ms — ABNORMAL HIGH (ref 0.9–1.2)
QTC CALCULATION (BAZETT): 529 ms
R AXIS: -28 degrees
T AXIS: 37 degrees
VENTRICULAR RATE: 87 {beats}/min

## 2024-07-08 LAB — ALBUMIN: ~~LOC~~ BKR ALBUMIN: 4 g/dL (ref 3.5–5.0)

## 2024-07-08 LAB — POC ACTIVATED CLOTTING TIME (ISTAT)
~~LOC~~ BKR POCT ACT ISTAT: 245 s
~~LOC~~ BKR POCT ACT ISTAT: 250 s

## 2024-07-08 LAB — POC INR
UKH BKR PROTIME POC: 15
~~LOC~~ BKR POC INR: 1.3 — ABNORMAL LOW (ref 2.0–3.0)

## 2024-07-08 MED ORDER — POTASSIUM CHLORIDE 20 MEQ PO TBTQ
40 meq | Freq: Every day | ORAL | 0 refills | Status: DC
Start: 2024-07-08 — End: 2024-07-09
  Administered 2024-07-09: 14:00:00 40 meq via ORAL

## 2024-07-08 MED ORDER — CLOPIDOGREL 75 MG PO TAB
75 mg | Freq: Every day | ORAL | 0 refills | Status: DC
Start: 2024-07-08 — End: 2024-07-08

## 2024-07-08 MED ORDER — TEMAZEPAM 15 MG PO CAP
15 mg | Freq: Every evening | ORAL | 0 refills | Status: DC | PRN
Start: 2024-07-08 — End: 2024-07-09

## 2024-07-08 MED ORDER — FENTANYL CITRATE (PF) 50 MCG/ML IJ SOLN
25 ug | INTRAVENOUS | 0 refills | Status: DC | PRN
Start: 2024-07-08 — End: 2024-07-09

## 2024-07-08 MED ORDER — DOCUSATE SODIUM 100 MG PO CAP
100 mg | Freq: Every day | ORAL | 0 refills | Status: DC | PRN
Start: 2024-07-08 — End: 2024-07-09

## 2024-07-08 MED ORDER — IMS MIXTURE TEMPLATE
150 mg | Freq: Every evening | ORAL | 0 refills | Status: DC
Start: 2024-07-08 — End: 2024-07-09
  Administered 2024-07-09 (×2): 150 mg via ORAL

## 2024-07-08 MED ORDER — ASPIRIN 81 MG PO CHEW
81 mg | Freq: Every evening | ORAL | 0 refills | Status: DC
Start: 2024-07-08 — End: 2024-07-09

## 2024-07-08 MED ORDER — OXYBUTYNIN CHLORIDE 5 MG PO TR24
5 mg | Freq: Every day | ORAL | 0 refills | Status: DC
Start: 2024-07-08 — End: 2024-07-09
  Administered 2024-07-08 – 2024-07-09 (×2): 5 mg via ORAL

## 2024-07-08 MED ORDER — ASPIRIN 325 MG PO TAB
325 mg | Freq: Once | ORAL | 0 refills | Status: CP
Start: 2024-07-08 — End: ?
  Administered 2024-07-08: 18:00:00 325 mg via ORAL

## 2024-07-08 MED ORDER — ALUMINUM-MAGNESIUM HYDROXIDE 200-200 MG/5 ML PO SUSP
30 mL | ORAL | 0 refills | Status: DC | PRN
Start: 2024-07-08 — End: 2024-07-09

## 2024-07-08 MED ORDER — ACETAMINOPHEN 325 MG PO TAB
650 mg | ORAL | 0 refills | Status: DC | PRN
Start: 2024-07-08 — End: 2024-07-09

## 2024-07-08 MED ORDER — LOSARTAN 50 MG PO TAB
50 mg | Freq: Every evening | ORAL | 0 refills | Status: DC
Start: 2024-07-08 — End: 2024-07-09
  Administered 2024-07-09: 03:00:00 50 mg via ORAL

## 2024-07-08 MED ORDER — HYDROMORPHONE (PF) 2 MG/ML IJ SYRG
.2 mg | INTRAVENOUS | 0 refills | Status: DC | PRN
Start: 2024-07-08 — End: 2024-07-09

## 2024-07-08 MED ORDER — PYRIDOSTIGMINE BROMIDE 60 MG PO TAB
120 mg | Freq: Three times a day (TID) | ORAL | 0 refills | Status: DC
Start: 2024-07-08 — End: 2024-07-09
  Administered 2024-07-08 – 2024-07-09 (×3): 120 mg via ORAL

## 2024-07-08 MED ORDER — IODIXANOL 320 MG IODINE/ML IV SOLN
0 refills | Status: DC
Start: 2024-07-08 — End: 2024-07-08

## 2024-07-08 MED ORDER — ALBUTEROL SULFATE 90 MCG/ACTUATION IN HFAA
2 | RESPIRATORY_TRACT | 0 refills | Status: DC | PRN
Start: 2024-07-08 — End: 2024-07-09

## 2024-07-08 MED ORDER — ACETAMINOPHEN 1,000 MG/100 ML (10 MG/ML) IV SOLN
1000 mg | Freq: Once | INTRAVENOUS | 0 refills | Status: AC | PRN
Start: 2024-07-08 — End: ?

## 2024-07-08 MED ORDER — FUROSEMIDE 40 MG PO TAB
120 mg | Freq: Every day | ORAL | 0 refills | Status: DC
Start: 2024-07-08 — End: 2024-07-09
  Administered 2024-07-09: 14:00:00 120 mg via ORAL

## 2024-07-08 MED ORDER — PROTAMINE 10 MG/ML IV SOLN
0 refills | Status: DC
Start: 2024-07-08 — End: 2024-07-08

## 2024-07-08 MED ORDER — EZETIMIBE 10 MG PO TAB
10 mg | Freq: Every evening | ORAL | 0 refills | Status: DC
Start: 2024-07-08 — End: 2024-07-09
  Administered 2024-07-09: 03:00:00 10 mg via ORAL

## 2024-07-08 MED ORDER — CETIRIZINE 10 MG PO TAB
10 mg | Freq: Every day | ORAL | 0 refills | Status: DC
Start: 2024-07-08 — End: 2024-07-09
  Administered 2024-07-08 – 2024-07-09 (×2): 10 mg via ORAL

## 2024-07-08 MED ORDER — SODIUM CHLORIDE 0.9% IV SOLP
INTRAVENOUS | 0 refills | Status: AC
Start: 2024-07-08 — End: ?
  Administered 2024-07-08: 17:00:00 1000.0000 mL via INTRAVENOUS

## 2024-07-08 MED ORDER — METOPROLOL SUCCINATE 50 MG PO TB24
100 mg | Freq: Every day | ORAL | 0 refills | Status: DC
Start: 2024-07-08 — End: 2024-07-09
  Administered 2024-07-09: 14:00:00 100 mg via ORAL

## 2024-07-08 MED ORDER — CARBIDOPA-LEVODOPA 25-100 MG PO TAB
2 | Freq: Three times a day (TID) | ORAL | 0 refills | Status: DC
Start: 2024-07-08 — End: 2024-07-09
  Administered 2024-07-08 – 2024-07-09 (×3): 2 via ORAL

## 2024-07-08 MED ORDER — PANTOPRAZOLE 40 MG PO TBEC
40 mg | Freq: Two times a day (BID) | ORAL | 0 refills | Status: DC
Start: 2024-07-08 — End: 2024-07-09
  Administered 2024-07-09 (×2): 40 mg via ORAL

## 2024-07-08 MED ORDER — CEFAZOLIN 2 GRAM IV SOLR
2 g | Freq: Once | INTRAVENOUS | 0 refills | Status: CP
Start: 2024-07-08 — End: ?
  Administered 2024-07-08: 20:00:00 2 g via INTRAVENOUS

## 2024-07-08 MED ORDER — HALOPERIDOL LACTATE 5 MG/ML IJ SOLN
1 mg | Freq: Once | INTRAVENOUS | 0 refills | Status: AC | PRN
Start: 2024-07-08 — End: ?

## 2024-07-08 MED ORDER — HYDROCODONE-ACETAMINOPHEN 5-325 MG PO TAB
1 | ORAL | 0 refills | Status: DC | PRN
Start: 2024-07-08 — End: 2024-07-09

## 2024-07-08 MED ORDER — PREDNISONE 10 MG PO TAB
5 mg | Freq: Every day | ORAL | 0 refills | Status: DC
Start: 2024-07-08 — End: 2024-07-09
  Administered 2024-07-08 – 2024-07-09 (×2): 5 mg via ORAL

## 2024-07-08 MED ORDER — TRIMETHOBENZAMIDE 100 MG/ML IM SOLN
200 mg | INTRAMUSCULAR | 0 refills | Status: DC | PRN
Start: 2024-07-08 — End: 2024-07-09

## 2024-07-08 MED ORDER — BUPIVACAINE (PF) 0.25 % (2.5 MG/ML) IJ SOLN
0 refills | Status: DC
Start: 2024-07-08 — End: 2024-07-08

## 2024-07-08 MED ORDER — ACETAMINOPHEN 500 MG PO TAB
1000 mg | Freq: Once | ORAL | 0 refills | Status: AC | PRN
Start: 2024-07-08 — End: ?

## 2024-07-08 MED ORDER — ROFLUMILAST 500 MCG PO TAB
500 ug | Freq: Every day | ORAL | 0 refills | Status: DC
Start: 2024-07-08 — End: 2024-07-09
  Administered 2024-07-09: 14:00:00 500 ug via ORAL

## 2024-07-08 MED ORDER — ARTIFICIAL TEARS (PF) SINGLE DOSE DROPS GROUP
1 [drp] | OPHTHALMIC | 0 refills | Status: DC | PRN
Start: 2024-07-08 — End: 2024-07-09

## 2024-07-08 MED ORDER — CLOPIDOGREL 75 MG PO TAB
75 mg | ORAL_TABLET | Freq: Every day | ORAL | 1 refills | Status: CN
Start: 2024-07-08 — End: ?

## 2024-07-08 MED ORDER — GABAPENTIN 300 MG PO CAP
300 mg | Freq: Three times a day (TID) | ORAL | 0 refills | Status: DC
Start: 2024-07-08 — End: 2024-07-09
  Administered 2024-07-08 – 2024-07-09 (×3): 300 mg via ORAL

## 2024-07-08 MED ORDER — CLOPIDOGREL 75 MG PO TAB
75 mg | Freq: Every day | ORAL | 0 refills | Status: DC
Start: 2024-07-08 — End: 2024-07-09
  Administered 2024-07-09: 14:00:00 75 mg via ORAL

## 2024-07-08 MED ORDER — SPIRONOLACTONE 25 MG PO TAB
25 mg | Freq: Every day | ORAL | 0 refills | Status: DC
Start: 2024-07-08 — End: 2024-07-09

## 2024-07-08 MED ORDER — ROSUVASTATIN 20 MG PO TAB
40 mg | Freq: Every evening | ORAL | 0 refills | Status: DC
Start: 2024-07-08 — End: 2024-07-09
  Administered 2024-07-09: 03:00:00 40 mg via ORAL

## 2024-07-08 MED ORDER — FAMOTIDINE 20 MG PO TAB
40 mg | Freq: Every evening | ORAL | 0 refills | Status: DC
Start: 2024-07-08 — End: 2024-07-09
  Administered 2024-07-09: 03:00:00 40 mg via ORAL

## 2024-07-08 MED ORDER — TAMSULOSIN 0.4 MG PO CAP
.4 mg | Freq: Every day | ORAL | 0 refills | Status: DC
Start: 2024-07-08 — End: 2024-07-09
  Administered 2024-07-09: 14:00:00 0.4 mg via ORAL

## 2024-07-08 MED ORDER — CYCLOBENZAPRINE 10 MG PO TAB
10 mg | Freq: Three times a day (TID) | ORAL | 0 refills | Status: DC | PRN
Start: 2024-07-08 — End: 2024-07-09

## 2024-07-08 MED ORDER — FENTANYL CITRATE (PF) 50 MCG/ML IJ SOLN
12.5 ug | INTRAVENOUS | 0 refills | Status: DC | PRN
Start: 2024-07-08 — End: 2024-07-09

## 2024-07-08 MED ORDER — HEPARIN (PORCINE) 2,000 UNIT/NS 1000 ML SOLP (EP)(TRANSEPTAL SHEATH)
0 refills | Status: DC
Start: 2024-07-08 — End: 2024-07-08

## 2024-07-08 MED ORDER — FLUTICASONE FUROATE 100 MCG/ACTUATION IN DSDV
1 | Freq: Every day | RESPIRATORY_TRACT | 0 refills | Status: DC
Start: 2024-07-08 — End: 2024-07-09
  Administered 2024-07-09: 14:00:00 1 via RESPIRATORY_TRACT

## 2024-07-08 MED ORDER — UMECLIDINIUM-VILANTEROL 62.5-25 MCG/ACTUATION IN DSDV
1 | Freq: Every day | RESPIRATORY_TRACT | 0 refills | Status: DC
Start: 2024-07-08 — End: 2024-07-09
  Administered 2024-07-09: 14:00:00 1 via RESPIRATORY_TRACT

## 2024-07-08 MED ADMIN — PHENYLEPHRINE HCL IN 0.9% NACL 20 MG/250 ML (80 MCG/ML) IV SOLN (STD CONC) [309174]: .3 ug/kg/min | INTRAVENOUS | @ 20:00:00 | Stop: 2024-07-08 | NDC 70004080840

## 2024-07-08 NOTE — Procedures [3]
 Cardiac Electrophysiology Brief Post-Procedure Note    Attending: Pinkey Skiff, MD    EP FELLOW: Aureliano Ellen, MD    Preoperative diagnosis: Paroxysmal atrial fibrillation, high risk for bleeding    Postoperative diagnosis: same as above    Procedure(s) Performed: LAA occlusion with Amulet device     Procedure Description: Successful LAA occlusion with Amulet device (28 mm)     Procedure Findings:    -Please review full operative report for full details of the procedure    Anesthesia: General anesthesia     Estimated blood loss: 10 mL    Complications: None     Specimens removed: None     Sheaths: RFV 14 Fr Amulet delivery system - F08 closure     Post-LAAO Recommendations:  - Pain control as needed  - Remove figure-of-eight suture at 4 hours after sheath removal if doing well  - Bedrest 4 hours after sheath removal  - Anticoagulation/Antiplatet: Discontinue warfarin.  Start clopidogrel (Plavix) 75 mg daily tomorrow morning. Continue Plavix until 6 months post-LAAO then discontinue and then use aspirin  81 mg daily alone  - Continue aspirin  81 mg daily for indefinite use  - Patient is not a candidate for Same Day Discharge Protocol because of: Procedure completed after 1PM  - Follow-up: TEE in 3 months

## 2024-07-08 NOTE — Progress Notes
This encounter was created in error. Please disregard.   This encounter was created in error. Please disregard.

## 2024-07-08 NOTE — Telephone Encounter
-----   Message from [facility] B sent at 07/08/2024  9:49 AM CDT -----  Regarding: YMR- appt and times?  VM on triage line from patient at 8:57am.  Could not make out all what he is asking but something about appointment and times to make sure he is there.  Call him at #[phone].

## 2024-07-08 NOTE — Telephone Encounter
 Spoke to the patient in regards to his arrival time. I let him know his TEE shows 2pm so that would be a 1 pm arrival time. However I told him I wanted to double check.     At that time my internet went out and I wasn't able to get in to his chart to review further.     I called the patient back and left a VM instructing him to call the EP lab for his arrival time.     I also reached out to the procedure arrival time team and they are going to try calling him as his arrival time was 0915

## 2024-07-08 NOTE — Progress Notes [1]
 Patient arrived on unit via ambulation accompanied by family. Patient transferred to the bed without assistance. Frailty score equals 4  Assessment completed, refer to flowsheet for details. Orders released, reviewed, and implemented as appropriate. Oriented to surroundings, call light within reach. Plan of care reviewed.  Will continue to monitor and assess.

## 2024-07-08 NOTE — Discharge Summary [5]
 Discharge Summary      Name: Johnny [name]  Medical Record Number: [id number]        Account Number:  [id number]  Date Of Birth:  1946-05-02                         Age:  78 y.o.  Admit date:  07/08/2024                     Discharge date: 07/09/2024      Discharge Attending: Dr. 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     Present on Admission:   Atrial Fib Paroxysmal  (HCC)        Significant Past Medical History        Back pain  Bronchiectasis (CMS-HCC)  CAD (coronary artery disease)  Carpal tunnel syndrome  Congestive heart disease (CMS-HCC)  Constipation  COPD (chronic obstructive pulmonary disease) (CMS-HCC)  Degenerative disc disease, lumbar  Dysphagia  Dysphonia  Erectile dysfunction, vasculogenic  Fracture  Fracture, ribs      Comment:  left  GERD (gastroesophageal reflux disease)      Comment:  controlled with elevated HOB, protonix  and pepcid    Hearing reduced      Comment:  aids now  Hiatal hernia  HX: anticoagulation  Hyperlipemia  Hypertension      Comment:  well controlled with medication  ICD (implantable cardiac defibrillator) in place  Ischemic cardiomyopathy  Joint pain  Memory loss      Comment:  some  Mitral regurgitation  Myasthenia gravis (CMS-HCC)      Comment:  only ocular at this time  Myocardial infarction (CMS-HCC)  NSVT (nonsustained ventricular tachycardia) (CMS-HCC)  OAB (overactive bladder)  Obesity, Class I, 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)  OSA on CPAP  Osteoarthritis  PAF (paroxysmal atrial fibrillation) (CMS-HCC)  Parkinson disease (CMS-HCC)  Pre-diabetes  Pulmonary emphysema (CMS-HCC)  Pulmonary fibrosis (CMS-HCC)      Comment:  One doctor said it could be  Pulmonary nodules  S/P ICD (internal cardiac defibrillator) procedure  Seasonal allergic reaction  Spinal stenosis  Ulcer  Unspecified sinusitis (chronic)      Comment:  MRSA    Allergies   Clarithromycin, Oxycodone , Ketoconazole, and Lisinopril    Brief Hospital Course   The patient was admitted and the following issues were addressed during this hospitalization: (with pertinent details including admission exam/imaging/labs).    Johnny [name] is a(n)78 y.o. male with a history of paroxysmal atrial fibrillation s/p ablation + falls related to Parkinson's who presents for EPS +catheter ablation with Dr. Wynona ALONSO Jess, MD. The procedure was tolerated well without immediate complications. The patient was monitored during recovery without any acute concern.  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     Post procedure imaging and testing:  Laboratory values stable  Limited echocardiogram  post LAAO demonstrates no significant pericardial effusion.  CXR post LAAO demonstrates device visualized in the expected left atrial region appearing well-seated without evidence of migration.  Unremarkable mediastinal contours.  No pneumothorax, hemothorax or significant pleural effusion.    Physical Exam   Constitutional: No distress.   Eyes: Conjunctivae are normal.   Cardiovascular: Normal rate and regular rhythm.   Pulmonary/Chest: Effort normal.   Musculoskeletal: Normal range of motion.   Neurological: alert.     Site(s): Groin(s)-right femoral vein figure-of-eight  no hematoma, drainage/oozing, or signs of infection and ;figure of eight suture previously removed by CTR staff  ;OTA    The patient is stable for discharge.     The patient and/or family was instructed on access site care, activity restrictions, medication(s )and follow-up in preparation for discharge (see below).     The following medications were addressed upon discharge: ---caralyst study---  - Anticoagulation: stop Coumadin  (Warfarin)   - Start clopidogrel (Plavix) 75 mg daily tomorrow morning. Continue Plavix until 6 months post-LAAO then discontinue and then use aspirin  81 mg daily      alone   - Anti-platelet: ASA 81mg  lifelong  - SBE prophylaxis x 6 months    Follow up:  Patient will be scheduled for a TEE in 3 months  Patient requested to establish with general cardiologist-previously patient of Dr. Fayette.  (Prefers [place] location)      Items Needing Follow Up   Pending items or areas that need to be addressed at follow up: none    Pending Labs and Follow Up Radiology    Pending labs and/or radiology review at this time of discharge are listed below: Please note- any labs with collected status will not have a result; if this area is blank, there are no items for review.     Medications      Medication List      START taking these medications     clopiDOGreL 75 mg tablet; Commonly known as: PLAVIX; Dose: 75 mg; Take   one tablet by mouth daily. Indications: amulet; For: amulet; Quantity: 90   tablet; Refills: 1    I DID not CHANGE THESE medications from PTA    CHANGE how you take these medications     famotidine  40 mg tablet; Commonly known as: PEPCID ; Dose: 40 mg;   Doctor's comments: * * N O T I C E * * Last quantity doesn't match   original quantity; TAKE 1 TABLET BY MOUTH EVERY DAY; Quantity: 90 tablet;   Refills: 0; What changed: when to take this   losartan  50 mg tablet; Commonly known as: COZAAR ; Dose: 50 mg; Doctor's   comments: 11/03/2023 4:06:39 PM; TAKE 1 TABLET BY MOUTH EVERY DAY;   Quantity: 90 tablet; Refills: 3; What changed: when to take this   potassium chloride  SR 20 mEq tablet; Commonly known as: K-DUR; Doctor's   comments: * * N O T I C E * * Last quantity doesn't match original   quantity; TAKE 2 TABLETS BY MOUTH MONDAY, WEDNESDAY AND FRIDAY AND TAKE 1   TABLET ALL OTHER DAYS; Quantity: 140 tablet; Refills: 1; What changed:   additional instructions     CONTINUE taking these medications     AEROBIKA OSCILLATING PEP SYSTM MISC; Refills: 0   albuterol  sulfate 90 mcg/actuation HFA aerosol inhaler; Commonly known   as: PROAIR  HFA; Dose: 2 puff; Refills: 0   aspirin  81 mg chewable tablet; Dose: 81 mg; Refills: 0   carbidopa -levodopa   25-100 mg tablet; Commonly known as: SINEMET ; Dose: 2   tablet; Take two tablets by mouth three times daily.; Quantity: 540   tablet; Refills: 3   cetirizine  10 mg tablet; Commonly known as: ZyrTEC ; Dose: 10 mg;   Refills: 0   CHOLEcalciferoL (vitamin D3) 1,000 units tablet; Dose: 1,000 Units;   Refills: 0   coenzyme Q10 200 mg capsule; Dose: 200 mg; Refills: 0   cyanocobalamin (vitamin B-12) 500 mcg tablet; Dose: 500 mcg; Refills: 0   cyclobenzaprine  10 mg tablet; Commonly known as: FLEXERIL ; Dose: 10 mg;   Take one tablet by mouth three times daily as needed for Muscle Cramps.;   Quantity: 90 tablet; Refills: 3   dextran 70-hypromellose (PF) 0.1/0.3 % ophthalmic solution; Commonly   known as: ARTIFICIAL TEARS (PF); Dose: 1 drop; Refills: 0   ezetimibe  10 mg tablet; Commonly known as: ZETIA ; Dose: 10 mg; Take one   tablet by mouth at bedtime daily.; Quantity: 90 tablet; Refills: 3   fesoterodine  ER 4 mg tablet; Commonly known as: TOVIAZ ; Dose: 4 mg; Take   one tablet by mouth daily. Do not cut/ crush/ chew; Quantity: 90 tablet;   Refills: 0   fluticasone -umeclidin-vilanter 100-62.5-25 mcg inhaler; Commonly known   as: TRELEGY ELLIPTA; Dose: 1 Dose; Refills: 0   furosemide  40 mg tablet; Commonly known as: LASIX ; Doctor's comments: *   * N O T I C E * * Last quantity doesn't match original quantity; Take 2   tabs on Mon-Wed-Fri  Take 3 tabs on Sun-Tues-Thur-Sat; Quantity: 226   tablet; Refills: 2   gabapentin  300 mg capsule; Commonly known as: NEURONTIN ; Dose: 300 mg;   Take one capsule by mouth three times daily.; Quantity: 270 capsule;   Refills: 3   glucosamine su 2KCl-chondroit 500-400 mg Tab; Dose: 1 tablet; Refills: 0   ketoconazole 2 % topical shampoo; Commonly known as: NIZORAL; Refills: 0   metoprolol  succinate XL 100 mg extended release tablet; Commonly known   as: TOPROL  XL; Dose: 100 mg; Doctor's comments: 11/03/2023 4:07:07 PM;   TAKE 1 TABLET BY MOUTH EVERY DAY; Quantity: 90 tablet; Refills: 3   omeprazole  DR 40 mg capsule; Commonly known as: PriLOSEC; Dose: 40 mg;   Doctor's comments: * * N O T I C E * * Last quantity doesn't match   original quantity; TAKE 1 CAPSULE BY MOUTH TWICE DAILY; Quantity: 180   capsule; Refills: 0   other medication; Dose: 1 Dose; Refills: 0   polyethylene glycol 3350  17 g packet; Commonly known as: MIRALAX ; Dose:   17 g; Refills: 0   predniSONE 5 mg tablet; Commonly known as: DELTASONE; Dose: 5 mg;   Refills: 0   pyRIDostigmine bromide 60 mg tablet; Commonly known as: MESTINON; Dose:   120 mg; Refills: 0   roflumilast 500 mcg tablet; Commonly known as: DALIRESP; Dose: 500 mcg;   Refills: 0   rosuvastatin  40 mg tablet; Commonly known as: CRESTOR ; Doctor's   comments: 08/10/2023 9:50:38 AM; TAKE 1 TABLET BY MOUTH AT BEDTIME;   Quantity: 90 tablet; Refills: 3   sodium chloride  7 % nebulizer solution; Commonly known as: HYPER-SAL;   Dose: 4 mL; Refills: 0   spironolactone  25 mg tablet; Commonly known as: ALDACTONE ; Dose: 25 mg;   Doctor's comments: 11/03/2023 4:07:18 PM; TAKE 1 TABLET BY MOUTH EVERY DAY   WITH FOOD; Quantity: 90 tablet; Refills: 3   tamsulosin  0.4 mg capsule; Commonly known as: FLOMAX ; Dose: 0.4 mg;   Refills: 0  testosterone cypionate 200 mg/mL Kit; Dose: 1 mL; Refills: 0   traZODone 100 mg tablet; Commonly known as: DESYREL; Dose: 150 mg;   Refills: 0   Vacuum Erection Device System Kit; Use as directed for sexual activity.;   Quantity: 1 kit; Refills: 11     STOP taking these medications warfarin 3 mg tablet; Commonly known as: COUMADIN        Return Appointments and Scheduled Appointments     Scheduled appointments:      Jul 23, 2024 12:30 PM  Echo Doppler with [facility] ECHO  Cardiovascular Medicine: [facility] (CVM Procedural) [address]  [address]  [place] [address]  [phone]     Jul 23, 2024 3:30 PM  ICD Check with [facility] PACEMAKER 1  Cardiovascular Medicine: [facility] (CVM Procedural) [address]  Level 5, [address]  [place] [address]  [phone]     Jul 23, 2024 4:00 PM  Office visit with Armin JAYSON Millin, MD  Cardiovascular Medicine: [facility] (CVM Exam) [address]  Level 5, [address]  [place]  [place] [address]  [phone]     Jul 30, 2024 2:00 PM  Office visit with Tonda KANDICE Meres, APRN-NP  [facility] Interventional Pain: [facility]) [address]  Level [address], [address]  [place]  [place] [address]  [phone]     Oct 01, 2024 2:30 PM  Procedure with Chiquita LITTIE Ship, MD  Otolaryngology: [facility] (ENT) [address]  [address] 3, [address]  [place]  [place] [address]  [phone]     Oct 09, 2024 9:00 AM  Office visit with Asberry MARLA Medin, APRN-NP  Cardiovascular Medicine: [facility] (CVM Exam) [address]  Level G, [address]    [place] [address]  [phone]            Consults, Procedures, Diagnostics, Micro, Pathology   Consults: Anesthesiology  Surgical Procedures & Dates: 07/08/2024 Successful LAA occlusion with Amulet device (28 mm) Dr. Pinkey Skiff  Significant Diagnostic Studies, Micro and Procedures: noted in brief hospital course  Significant Pathology: none                       Discharge Disposition, Condition   Patient Disposition: Home or Self Care [01]  Condition at Discharge: Stable    Code Status   Full Code    Patient Instructions     Activity       Procedure Specific Activity   As directed *Resume your normal activity in 3 days  *You may drive after 2 days.  *You may shower after discharge.  *NO tub baths, hot tubs, or swimming for 1 week.  *NO lifting greater than 15 pounds or strenuous activity for 1 week.   *NO sexual activity  for 1 week.          Diet       Cardiac Diet   As directed      Limiting unhealthy fats and cholesterol is the most important step you can take in reducing your risk for cardiovascular disease.  Unhealthy fats include saturated and trans fats.  Monitor your sodium and cholesterol intake.  Restrict your sodium to 2g (grams) or 2000mg  (milligrams) daily, and your cholesterol to 200mg  daily.    If you have questions regarding your diet at home, you may contact a dietitian at [phone].            Wounds/Lines/Drains       Incision Care   As directed      *Call if there is an increase in pain, swelling, or redness.  *DO NOT soak incision in water .  *NO tub baths, hot tubs, or swimming.  *You may shower after discharge.             Discharge education provided to patient., Signs and Symptoms: , Education: , and Others Instructions:     Additional Orders: Case Management, Supplies, Home Health     Home Health/DME       None            Discharge APP Time: I, the APP, spent greater than 30 minutes in counseling pt, coordinating discharge care, placing discharge orders and complete discharge summary.    Signed:  Rexene LITTIE Wilhemina Dewey, APRN-NP  07/09/2024      cc:  Primary Care Physician:  Bette Setter   Verified    Referring physicians:  Self, Referral   Additional provider(s):        Did we miss something? If additional records are needed, please fax a request on office letterhead to [phone]. Please include the patient's name, date of birth, fax number and type of information needed. Additional request can be made by email at [email or web address]. For general questions of information about electronic records sharing, call [phone].

## 2024-07-08 NOTE — Progress Notes [1]
 RT Adult Assessment Note    NAME:Johnny [name]             MRN: [id number]             DOB:08-05-1946          AGE: 78 y.o.  ADMISSION DATE: 07/08/2024             DAYS ADMITTED: LOS: 0 days    Additional Comments:  Impressions of the patient: NO DISTRESS NOTED      Vital Signs:  Pulse: 74  RR: 13 PER MINUTE  SpO2: 98 %  O2 Device: Nasal cannula  Liter Flow: 2 Lpm  O2%:      Breath Sounds:   Breath Sounds WDL: Within Defined Limits  Respiratory Effort:   Respiratory WDL: Within Defined Limits  Comments: PP04

## 2024-07-08 NOTE — Progress Notes
 This encounter was created in error. Please disregard.

## 2024-07-09 ENCOUNTER — Inpatient Hospital Stay: Admit: 2024-07-09 | Discharge: 2024-07-09 | Payer: MEDICARE

## 2024-07-09 ENCOUNTER — Inpatient Hospital Stay: Admit: 2024-07-01 | Discharge: 2024-07-01 | Payer: MEDICARE

## 2024-07-09 ENCOUNTER — Encounter: Admit: 2024-07-09 | Discharge: 2024-07-09 | Payer: MEDICARE

## 2024-07-09 DIAGNOSIS — I255 Ischemic cardiomyopathy: Secondary | ICD-10-CM

## 2024-07-09 DIAGNOSIS — Z951 Presence of aortocoronary bypass graft: Secondary | ICD-10-CM

## 2024-07-09 DIAGNOSIS — G20A1 Parkinson's disease without dyskinesia, without mention of fluctuations (CMS-HCC): Secondary | ICD-10-CM

## 2024-07-09 DIAGNOSIS — I340 Nonrheumatic mitral (valve) insufficiency: Secondary | ICD-10-CM

## 2024-07-09 DIAGNOSIS — Z7982 Long term (current) use of aspirin: Secondary | ICD-10-CM

## 2024-07-09 DIAGNOSIS — I2510 Atherosclerotic heart disease of native coronary artery without angina pectoris: Secondary | ICD-10-CM

## 2024-07-09 DIAGNOSIS — I10 Essential (primary) hypertension: Secondary | ICD-10-CM

## 2024-07-09 DIAGNOSIS — I110 Hypertensive heart disease with heart failure: Secondary | ICD-10-CM

## 2024-07-09 DIAGNOSIS — Z7901 Long term (current) use of anticoagulants: Secondary | ICD-10-CM

## 2024-07-09 DIAGNOSIS — Z881 Allergy status to other antibiotic agents status: Secondary | ICD-10-CM

## 2024-07-09 DIAGNOSIS — E785 Hyperlipidemia, unspecified: Secondary | ICD-10-CM

## 2024-07-09 DIAGNOSIS — M1712 Unilateral primary osteoarthritis, left knee: Secondary | ICD-10-CM

## 2024-07-09 DIAGNOSIS — E7800 Pure hypercholesterolemia, unspecified: Principal | ICD-10-CM

## 2024-07-09 DIAGNOSIS — I480 Paroxysmal atrial fibrillation: Principal | ICD-10-CM

## 2024-07-09 DIAGNOSIS — J984 Other disorders of lung: Secondary | ICD-10-CM

## 2024-07-09 DIAGNOSIS — I5022 Chronic systolic (congestive) heart failure: Secondary | ICD-10-CM

## 2024-07-09 DIAGNOSIS — Z952 Presence of prosthetic heart valve: Secondary | ICD-10-CM

## 2024-07-09 DIAGNOSIS — I428 Other cardiomyopathies: Secondary | ICD-10-CM

## 2024-07-09 DIAGNOSIS — Z95810 Presence of automatic (implantable) cardiac defibrillator: Secondary | ICD-10-CM

## 2024-07-09 DIAGNOSIS — G7000 Myasthenia gravis without (acute) exacerbation: Secondary | ICD-10-CM

## 2024-07-09 DIAGNOSIS — G4733 Obstructive sleep apnea (adult) (pediatric): Secondary | ICD-10-CM

## 2024-07-09 DIAGNOSIS — Z006 Encounter for examination for normal comparison and control in clinical research program: Secondary | ICD-10-CM

## 2024-07-09 DIAGNOSIS — J438 Other emphysema: Secondary | ICD-10-CM

## 2024-07-09 DIAGNOSIS — J479 Bronchiectasis, uncomplicated: Secondary | ICD-10-CM

## 2024-07-09 DIAGNOSIS — Z96651 Presence of right artificial knee joint: Secondary | ICD-10-CM

## 2024-07-09 DIAGNOSIS — I252 Old myocardial infarction: Secondary | ICD-10-CM

## 2024-07-09 LAB — LIMITED ECHO
BSA: 2.1 m2
RA PRESSURE: 8

## 2024-07-09 LAB — LIPID PROFILE
~~LOC~~ BKR CHOLESTEROL: 121 mg/dL (ref <200)
~~LOC~~ BKR HDL: 38 mg/dL — ABNORMAL LOW (ref >40)
~~LOC~~ BKR LDL: 58 mg/dL (ref <100.00)
~~LOC~~ BKR NON HDL CHOLESTEROL: 83 mg/dL
~~LOC~~ BKR TRIGLYCERIDES: 177 mg/dL — ABNORMAL HIGH (ref <150)
~~LOC~~ BKR VLDL: 35 mg/dL

## 2024-07-09 MED ORDER — ROSUVASTATIN 40 MG PO TAB
40 mg | ORAL_TABLET | Freq: Every evening | ORAL | 0 refills | 90.00000 days | Status: AC
Start: 2024-07-09 — End: ?

## 2024-07-09 MED ORDER — CLOPIDOGREL 75 MG PO TAB
75 mg | ORAL_TABLET | Freq: Every day | ORAL | 1 refills | 90.00000 days | Status: AC
Start: 2024-07-09 — End: ?

## 2024-07-09 NOTE — Telephone Encounter
 Called pt, and reached his wife. I spoke with her regarding the interaction with omeprazole  and plavix. She is going to reach out to patient's primary care provider to see if they can switch is PPI to protonix .     I called and updated the pharmacy regarding this.

## 2024-07-09 NOTE — Telephone Encounter
 Need updated FLP. FLP added on to labs completed today.

## 2024-07-09 NOTE — Telephone Encounter
 Pt had watchman implant with YMR yesterday.     Called pharmacy back, reached VM. LVM for pharmacy staff regarding indication for plavix rx. Gave cb number for further questions, but requested that they please fill patient's plavix.

## 2024-07-09 NOTE — Progress Notes [1]
 Patient discharged to home with all belongings.  Discharge instructions, med reconciliation and home wound care instructions given and explained to patient and family both verbally and written.  Accompanied by his daughter.  No complaints of pain or discomfort.  Right groin  remains clean, dry, and intact with no evidence of a hematoma after ambulation.  Patient escorted to lobby via wheelchair by staff.  Patient to follow up with [facility]) or on-call physician with any additional questions or concerns.  All contact numbers provided.  Patient and family acceptant of DC instuctions and report understanding to all information.

## 2024-07-09 NOTE — Telephone Encounter
-----   Message from [place] D sent at 07/09/2024 10:47 AM CDT -----  Regarding: Medication Clarfication - YMR  [facility] Drug in [place] LVM @ 10:34 am  Need clarification on Plavix and medication indications for this medication.  Requesting a call back @ [phone]

## 2024-07-10 ENCOUNTER — Encounter: Admit: 2024-07-10 | Discharge: 2024-07-10 | Payer: MEDICARE

## 2024-07-10 MED ORDER — PANTOPRAZOLE 40 MG PO TBEC
40 mg | ORAL_TABLET | Freq: Every day | ORAL | 5 refills | 90.00000 days | Status: AC
Start: 2024-07-10 — End: ?

## 2024-07-10 NOTE — Telephone Encounter
 Ronnald, Pharmacist at [facility] LVM stating patient has been recently started on Plavix which has an interaction with Omeprazole . Omeprazole  decreases effectiveness of Plavix. Ronnald asks if patient can be switched to Pantoprazole  medication instead. Ronnald states patient is aware and on board with switch. RN routing message to provider.

## 2024-07-11 ENCOUNTER — Encounter: Admit: 2024-07-11 | Discharge: 2024-07-11 | Payer: MEDICARE

## 2024-07-16 ENCOUNTER — Encounter: Admit: 2024-07-16 | Discharge: 2024-07-16 | Payer: MEDICARE

## 2024-07-17 ENCOUNTER — Encounter: Admit: 2024-07-17 | Discharge: 2024-07-17 | Payer: MEDICARE

## 2024-07-17 NOTE — Patient Instructions [37]
 3 month post Amulet office visit and TEE (transesophageal echocardiogram)  10/09/2024 at 09:00 am   Office Visit with Rhoda Medin APRN at the [facility]:  [address], [place]  [place] [address]   10/09/2024 at 09:30 am     (arrival time) TEE (Transesophageal Echocardiogram)  [address], [address]  [place], [place] [address]  THIS LOCATION IS NOT IN THE HEART CENTER - IT IS ON THE RIGHT SIDE OF THE LOBBY ONCE YOU ENTER THE HOSPITAL.     TEE Instructions:  Nothing to eat or drink after 11 pm the night before your procedure.    You must have a designated person who will be driving you home and staying with you after the procedure.    Please take all prescribed medications the morning of your procedure with a sip of water  unless otherwise instructed by your nurse.    HOLD all erectile dysfunction medications for 3 days prior to procedure unless taking for pulmonary hypertension.     HOLD all vitamins, supplements, diuretics (LASIX  and SPIRONOLACTONE ), and oral diabetic medications the morning of your procedure.      If you have any questions, please call the Procedure RNs office at [phone]     6 month post Amulet office visit:  01/13/2025 at 10:30 am Office Visit with Dr. Laurian at the [facility]:  [address], [address], [address], [place]  [address].     Patient verbalized understanding of date/time/location of all appointments.    Please let me know if you have any questions or concerns.    Thanks so much,  Corean Leventhal, BSN, RN, [facility]- Nurse Navigator   [facility]  Cardiac Navigation  Phone: [phone]- [email or web address]  [address], [place], [place]  [address]

## 2024-07-17 NOTE — Telephone Encounter [36]
 Patient's identity was verified using two patient identifiers (name an date of birth).    Amulet implant completed 07/08/2024 with Dr. Guillermina HERO. Reddy.   Called patient to follow up with patient post implantation and schedule Amulet follow up appointment including TEE (transesophageal echocardiogram).    Assessment s/s of complications after LAAO (screening for bleeding event, neuro event, vascular complications, and delayed pericardial effusion).   Patient denies signs of bleeding.  Patient denies symptoms of stroke/TIA.  Patient denies swelling, bleeding or groin site pain.  Patient denies new chest pain or new shortness of breath.  Patient denies any hospitalizations or changes in OAC/ASA status since discharge from Amulet implant.     Device Related Thrombus reduction:  Advised patient that they are to continue to take the Plavix and Aspirin  until otherwise informed by Dr. Guillermina HERO. Reddy's team.  It is imperative they continue to take these medications as prescribed to reduce the risk of device related thrombus.  If they have any questions or concerns they are to call the office at [phone] (electrophysiology) as soon as possible but not to make any medication changes without the physician's instructions.

## 2024-07-23 ENCOUNTER — Ambulatory Visit: Admit: 2024-07-23 | Discharge: 2024-07-23 | Payer: MEDICARE

## 2024-07-23 ENCOUNTER — Encounter: Admit: 2024-07-23 | Discharge: 2024-07-23 | Payer: MEDICARE

## 2024-07-23 ENCOUNTER — Ambulatory Visit: Admit: 2024-07-23 | Discharge: 2024-07-24 | Payer: MEDICARE

## 2024-07-23 VITALS — BP 110/68 | HR 99 | Ht 71.0 in | Wt 216.8 lb

## 2024-07-23 DIAGNOSIS — R0989 Other specified symptoms and signs involving the circulatory and respiratory systems: Secondary | ICD-10-CM

## 2024-07-23 DIAGNOSIS — I480 Paroxysmal atrial fibrillation: Principal | ICD-10-CM

## 2024-07-23 DIAGNOSIS — Z95810 Presence of automatic (implantable) cardiac defibrillator: Secondary | ICD-10-CM

## 2024-07-23 DIAGNOSIS — I472 VT (ventricular tachycardia) (CMS-HCC): Secondary | ICD-10-CM

## 2024-07-23 NOTE — Assessment & Plan Note [38]
 Device interrogation today shows stable sensing and pacing thresholds.  He is 7% atrially paced and RV paced 0.2% of the time.  There is 2.83 years of battery life remaining.  Overall A-fib burden is 8.2%.  I did not make any changes to his programming.

## 2024-07-23 NOTE — Patient Instructions [37]
 Follow-Up:    -Thank you for allowing me to take care of you today. My name is Julus Kelley, CHARITY FUNDRAISER.    -We would like you to follow up in  1. years with Deward Gauss, APRN   The schedule is released approximately 4-5 months in advance. You should be called or mailed to make an appointment, however if you would like to call us  to make this appt, please call [phone].      -You will receive a survey in the upcoming week from [facility]. Your feedback is     Contacting our office:    -Business Hours: Monday-Friday, 8:00 am-4:30 pm (excluding Holidays).     -For medical questions or concerns, please send us  a message through your MyChart account or call the Heart Rhythm Management nursing triage line at [phone]. Please leave a detailed message with your name, date of birth, and reason for your call.  If your message is received before 3:30pm, every effort will be made to call you back the same day.  Please allow time for us  to review your chart prior to call back.     -For medication refills please start by contacting your pharmacy. You can also send us  a prescription question through your MyChart or call the nurse triage line above.     -Please allow a minimum of 10-14 business days for clearance from your cardiologist for procedures and/or FMLA, disability, or  DOT forms. An office visit or testing may be required for us  to provide clearance.     -Our fax number is [phone].    Results & Testing Follow Up:    -Please allow 10-15 business days for the results of any testing to be reviewed. Please call our office if you have not heard from a nurse within this time frame.    -Should you choose to complete testing at an outside facility, please contact our office after completion of testing so that we can ensure that we have received results.    Lab and test results:  As a part of the CARES act, starting 12/19/2019, some results will be released to you via mychart immediately and automatically.  You may see results before your provider sees them; however, your provider will review all these results and then they, or one of their team, will notify you of result information and recommendations.   Critical results will be addressed immediately, but otherwise, please allow us  time to get back with you prior to you reaching out to us  for questions.  This will usually take about 72 hours for labs and 5-7 days for procedure test results.      We know you have a choice and want to thank you for choosing [facility].

## 2024-07-23 NOTE — Progress Notes [1]
 Date of Service: 07/23/2024    Johnny [name] is a 78 y.o. male.       HPI     Johnny [name] presents to my office today in electrophysiology follow-up for history of atrial arrhythmias and defibrillator.  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  At that time, he was complaining of some nonspecific shortness of breath on exertion as well as orthopedic limitations.  Device interrogation showed an overall A-fib burden of 0.36%.  He remained on anticoagulation.    Johnny [name] recently underwent successful amulet implant as part of the catalyst trial on October 20.  He was placed on dual antiplatelet agents afterwards.  He is in my office today for annual follow-up.  Patient denies chest discomfort, shortness of breath, dizziness, or syncope.  He has had no palpitations.  His energy level is poor because he is under a lot of stress due to the health of his wife.  He reports that she has been in and out of the hospital with hypotension and pericardial effusion.  He is tearful here, stating that he is not sure whether she needs to be in the hospital.    Interrogation of the patient's dual-chamber defibrillator shows stable sensing and pacing thresholds.  He is 7% atrially paced and RV paced 0.2% of the time.  There is 2.83 years of battery life remaining.  Overall A-fib burden is 8.2%.         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  EF has improved but patient needs to continue HFrEF oriented medical therapy      VT (ventricular tachycardia) (CMS-HCC) 02/28/2018     02/19/2020 - Regadenoson MPI:  Small basal to mid inferolateral wall  fixed perfusion defec with corresponding hypokinesis on gated images suggestive of myocardial injury & small size, mild basal anterolateral wall  predominantly reversible perfusion defect  Corresponding regional wall motion is normal.  All myocardial segments appear viable.EF 46%.  High risk scintigraphic features are absent.       S/P R total knee arthroplasty 12/08/2014    Encounter for anticoagulation discussion and counseling 11/28/2014    Closed fracture of multiple ribs of left side 08/29/2014    Arthritis of left knee 05/13/2014 Left arm swelling 04/01/2014    Hyperlipemia 02/11/2009     HIghest total cholesterol he recalls pre treatment: 270  a. 7/06 Rx Vytorin 10/40, then to 10/80 approx 04/28/05. Recalls knee pain w/ lovastatin  b. 08/03/05 total 156 trig 118 HDL 39 LDL 95  C. 02/09/09 total 148 trig 114  HDL 43  LDL 81 Vytorin 10/80 Fishoil  >>Starts Lipitor 80 + Zetia  March 2011      Sleep apnea 02/11/2009     Sleep apnea      a. 2/07 Using CPAP, good tolerance and benefit after Sleep study approx 2003 Dr. Naomia      Hypertension 02/11/2009     Hypertension        a. ACE cough on lisinopril, olmesartan 20 well tolerated       Cerebrovascular disease 02/11/2009     Cerebrovascular disease       a. 7/06 Carotid US  mod disease < 50% sten      Severe Johnny treated with valvuloplasty 2006 02/11/2009     a.  04/14/05 #28 [place] ring, Resect P2 Seg Post leaflet,ring.as above w/ CABG-[place]      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     a. 04/14/05: CABG x 5 LIMA-LAD, L Rad-Dx, sSVG-RCA-RCA, sSVG-OM & MV Plasty for Johnny  with initial trigger for evaluation being murmur and dyspnea  b. 12/13/05 EF 35% by aden thall, Mixed defect in Cx distribution. Referred for ICD   c. Rash w/ Spiro, switch to Inspra  d. 6/07 Removal of all 8 sternal wires and parasternal lipoma d/t persisting pain  E. Jan, 2011 Regaden Smith. Limited inferior Defect, unchanged from 2007. EF 48%  F. 10/08/13: Reg/Thall Stress: EF 48% No ischemia or change from prior studies.    January 15, 2018  Lexiscan sestamibi study;  sinus rhythm, no arrhythmias or EKG changes    EF was 57% with normal wall motion and normal perfusion. [employer]       Atrial Fib Paroxysmal  (HCC) 02/08/2009     a. 7/06 Early post CAB Afib, Brief amio pre dismissal. Post DC AF still rapid, admit [place]  b. 05/23/05 TEE: No LA clot, EF 25% Cvert to SR on Ticosyn 500 BID, dec'd to 250 BID w/             QTC@ , QTc to 470. Home 05/26/05  c. 11/29/05 Ticosyn DC'd, Dr. Fayette p recurrent afib on ticosyn  D. 12/14 NSR on clinic exam, MAC OV (CBP)  E. 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 Maybell  12/17/13 Generator at ERI,  Fidelis RV-ICD lead extraction w/ laser assistance,Implant Medtronic Evera XT ICD and new RV lead. RA lead left i tact. DFTs done. Dr. Maylene           Review of Systems   Constitutional: Negative.   HENT: Negative.     Eyes: Negative.    Cardiovascular: Negative.    Respiratory:  Positive for shortness of breath.    Endocrine: Negative.    Hematologic/Lymphatic: Negative.    Skin: Negative.    Musculoskeletal: Negative.    Gastrointestinal: Negative.    Genitourinary: Negative.    Neurological: Negative.    Psychiatric/Behavioral: Negative.     Allergic/Immunologic: Negative.        Physical Exam  General Appearance: obese, well nourished in no acute distress  Skin: warm, moist, no ulcers  HEENT: extraocular movements intact, oropharynx clear  Neck Veins: neck veins are flat, neck veins are not distended  Carotid Arteries: normal carotid upstroke bilaterally, no bruits  Chest Inspection: chest is normal in appearance  Auscultation/Percussion: decreased breath sounds throughout, no rales, rhonchi, or wheezing  Cardiac Rhythm: regular rhythm and normal rate  Cardiac Auscultation: Normal S1 & S2, no S3 or S4, no rub  Murmurs: no cardiac murmurs   Extremities: no lower extremity edema; 1+ symmetric distal pulses  Abdominal Exam: soft, non-tender, no masses, bowel sounds normal  Liver & Spleen: no organomegaly  Neurologic Exam: neurological assessment grossly intact, confused, tearful in office      Cardiovascular Studies  12 lead EKG:  Atrial fibrillation, ventricular rate 99 bpm, QTc 479 msec, QRSd 150 msec, RBBB    Cardiovascular Health Factors  Vitals BP Readings from Last 3 Encounters:   07/23/24 110/68   07/09/24 133/67   07/08/24 129/76     Wt Readings from Last 3 Encounters:   07/23/24 98.3 kg (216 lb 12.8 oz)   07/09/24 91.6 kg (202 lb)   07/08/24 91.6 kg (202 lb)     BMI Readings from Last 3 Encounters:   07/23/24 30.24 kg/m?   07/09/24 28.17 kg/m?   07/08/24 28.17 kg/m?      Smoking Tobacco Use History[1]   Lipid Profile Cholesterol   Date Value Ref Range Status   07/09/2024 121 <200 mg/dL Final     HDL   Date Value Ref Range Status   07/09/2024 38 (L) >40 mg/dL Final     LDL   Date Value Ref Range Status   07/09/2024 58.00 <100.00 mg/dL Final     Triglycerides   Date Value Ref Range Status   07/09/2024 177 (H) <150 mg/dL Final      Blood Sugar Hemoglobin A1C   Date Value Ref Range Status   09/23/2010 6.2       Glucose   Date Value Ref Range Status   07/09/2024 138 (H) 70 - 100 mg/dL Final   89/86/7974 896 (H) 70 - 100 mg/dL Final   92/86/7977 897 (H) 70 - 100 MG/DL Final     Glucose, POC   Date Value Ref Range Status   07/08/2024 125 (H) 70 - 100 mg/dL Final   93/89/7975 869 (H) 70 - 100 MG/DL Final   93/89/7975 868 (H) 70 - 100 MG/DL Final          Problems Addressed Today  Encounter Diagnoses   Name Primary?  Paroxysmal atrial fibrillation (CMS-HCC) Yes    ICD (implantable cardioverter-defibrillator), dual, in situ     VT (ventricular tachycardia) (CMS-HCC)     Cardiovascular symptoms     Morbid obesity (CMS-HCC)     Automatic implantable cardioverter-defibrillator in situ        Assessment and Plan       Atrial Fib Paroxysmal  (HCC)  Unfortunately, he is back in atrial fibrillation today with a mildly elevated ventricular rate.  Bennet is relatively asymptomatic with his arrhythmias.  Overall A-fib burden by device interrogation is 8.2%.    Johnny. Gironda recently underwent amulet implantation as part of the catalyst trial.  He is on dual antiplatelet agents.  I did discuss this with his implanting physician.  If we were to pursue cardioversion, patient would need to be switched to anticoagulation.  At this point, Johnny. Siedschlag is somewhat asymptomatic with his A-fib.  I am hopeful that he will convert out of this on his own.  Therefore, we are going to monitor him for now.  If he remains in A-fib in a month, then he will undergo TEE guided cardioversion back to sinus rhythm.    In terms of his recent left atrial appendage occlusion procedure, Bennet has done well.  He voices no ill effects from the procedure.  He is scheduled to undergo an echocardiogram next week with plans for TEE in 3 months time.    Automatic implantable cardioverter-defibrillator in situ  Device interrogation today shows stable sensing and pacing thresholds.  He is 7% atrially paced and RV paced 0.2% of the time.  There is 2.83 years of battery life remaining.  Overall A-fib burden is 8.2%.  I did not make any changes to his programming.    I have asked the patient to follow up with EP APP in 3 months.    I spent over  >40 minutes today for the visit including some or all of the following: preparing to see the patient, history/exam, placing orders, documenting the visit, communicating with care team, interpreting tests, and care coordination/communication.          Current Medications (including today's revisions)   albuterol  (VENTOLIN  HFA, PROAIR  HFA) 90 mcg/actuation inhaler Inhale two puffs by mouth into the lungs four times daily as needed for Wheezing.    aspirin  81 mg chewable tablet Chew one tablet by mouth at bedtime daily.    carbidopa -levodopa  (SINEMET ) 25-100 mg tablet Take two tablets by mouth three times daily. (Patient taking differently: Take two tablets by mouth three times daily. Take 2 tablets in the morning and 1 tablet 4 more times through out the day)    cetirizine  (ZYRTEC ) 10 mg tablet Take one tablet by mouth daily.    cholecalciferol (Vitamin D3) (VITAMIN D-3) 1,000 units tablet Take one tablet by mouth twice daily.      clopiDOGreL (PLAVIX) 75 mg tablet Take one tablet by mouth daily. Indications: amulet    coenzyme Q10 200 mg capsule Take one capsule by mouth daily.    cyanocobalamin (vitamin B-12) 500 mcg tablet Take one tablet by mouth daily.    cyclobenzaprine  (FLEXERIL ) 10 mg tablet Take one tablet by mouth three times daily as needed for Muscle Cramps.    dextran 70-hypromellose (PF) (ARTIFICIAL TEARS (PF)) 0.1/0.3 % ophthalmic solution Apply one drop to both eyes every 4 hours as needed for Dry Eyes.    ezetimibe  (ZETIA ) 10 mg tablet Take one tablet by mouth at bedtime daily.  famotidine  (PEPCID ) 40 mg tablet TAKE 1 TABLET BY MOUTH EVERY DAY (Patient taking differently: Take one tablet by mouth at bedtime daily.)    fesoterodine  ER (TOVIAZ ) 4 mg tablet Take one tablet by mouth daily. Do not cut/ crush/ chew    fluticasone -umeclidin-vilanter (TRELEGY ELLIPTA) 100-62.5-25 mcg inhaler Inhale 1 Dose by mouth into the lungs daily after dinner.    furosemide  (LASIX ) 40 mg tablet Take 2 tabs on Mon-Wed-Fri  Take 3 tabs on Sun-Tues-Thur-Sat    gabapentin  (NEURONTIN ) 300 mg capsule Take one capsule by mouth three times daily.    glucosamine su 2KCl-chondroit 500-400 mg tab Take 1 Tab by mouth twice daily.    ketoconazole (NIZORAL) 2 % topical shampoo Apply  topically to affected area every 7 days. Apply topically to affected area of damp skin, lather, leave on 5 minutes, and rinse.    losartan  (COZAAR ) 50 mg tablet TAKE 1 TABLET BY MOUTH EVERY DAY (Patient taking differently: Take one tablet by mouth at bedtime daily.)    metoprolol  succinate XL (TOPROL  XL) 100 mg extended release tablet TAKE 1 TABLET BY MOUTH EVERY DAY    mucus clearing device (AEROBIKA OSCILLATING PEP SYSTM MISC) Use  as directed. Use with salt water  and inhale twice daily    other medication one Dose. Mupirocin dissolved in warm water  and Budesonide in NS  qhs  (sometimes increases to BID)    pantoprazole  DR (PROTONIX ) 40 mg tablet Take one tablet by mouth daily.    polyethylene glycol 3350  (MIRALAX ) 17 g packet Take one packet by mouth daily as needed.    potassium chloride  SR (K-DUR) 20 mEq tablet TAKE 2 TABLETS BY MOUTH MONDAY, WEDNESDAY AND FRIDAY AND TAKE 1 TABLET ALL OTHER DAYS (Patient taking differently: TAKE 1 TABLETS BY MOUTH MONDAY, WEDNESDAY AND FRIDAY AND TAKE 2 TABLETS ALL OTHER DAYS)    predniSONE (DELTASONE) 5 mg tablet Take one tablet by mouth daily with breakfast.    pyRIDostigmine bromide (MESTINON) 60 mg tablet Take two tablets by mouth three times daily.      roflumilast (DALIRESP) 500 mcg tablet Take one tablet by mouth daily.    rosuvastatin  (CRESTOR ) 40 mg tablet TAKE 1 TABLET BY MOUTH EVERY NIGHT AT BEDTIME    sodium chloride  (HYPER-SAL) 7 % nebulizer solution Inhale 4 mL solution by nebulizer as directed daily.    spironolactone  (ALDACTONE ) 25 mg tablet TAKE 1 TABLET BY MOUTH EVERY DAY WITH FOOD    tamsulosin  (FLOMAX ) 0.4 mg capsule Take one capsule by mouth daily.    testosterone cypionate 200 mg/mL kit Inject 1 mL into the muscle every 14 days.    traZODone (DESYREL) 100 mg tablet Take 1.5 tablets by mouth at bedtime daily.    Vacuum Erection Device System kit Use as directed for sexual activity.                 [1]   Social History  Tobacco Use   Smoking Status Never   Smokeless Tobacco Never

## 2024-07-24 ENCOUNTER — Encounter: Admit: 2024-07-24 | Discharge: 2024-07-24 | Payer: MEDICARE

## 2024-07-24 NOTE — Telephone Encounter [36]
-----   Message from Ethan A sent at 07/24/2024  8:30 AM CST -----  Regarding: RCP - Afib with High V rates  A Yellow Alert was reported by the device:  Date of Interrogation: 24-Jul-2024 00:00:07    Alert: Avg. Ventricular Rate >= 100 bpm during AT/AF (>= 6 hr) for 1 days.  AT/AF >= 24 hr for 1 days.    Presenting EGM shows Afib-irregular VS 70s to 120s bpm  Total VP 5.9%    OptiVol fluid index and thoracic impedance shows fluid accumulation    Ongoing Afib since 07/23/2024 01:30am  V rates >100 bpm ~ 60-70% of the time  Watchman in place    Report is sent to chart  Please follow up  Thanks

## 2024-07-24 NOTE — Telephone Encounter [36]
 Patient seen by Dr. Laurian yesterday in the clinic. Per OV note:    Atrial Fib Paroxysmal  (HCC)  Unfortunately, he is back in atrial fibrillation today with a mildly elevated ventricular rate.  Johnny [name] is relatively asymptomatic with his arrhythmias.  Overall A-fib burden by device interrogation is 8.2%.     Johnny [name] recently underwent amulet implantation as part of the catalyst trial.  He is on dual antiplatelet agents.  I did discuss this with his implanting physician.  If we were to pursue cardioversion, patient would need to be switched to anticoagulation.  At this point, Johnny [name] is somewhat asymptomatic with his A-fib.  I am hopeful that he will convert out of this on his own.  Therefore, we are going to monitor him for now.  If he remains in A-fib in a month, then he will undergo TEE guided cardioversion back to sinus rhythm.

## 2024-07-25 ENCOUNTER — Encounter: Admit: 2024-07-25 | Discharge: 2024-07-25 | Payer: MEDICARE

## 2024-07-26 ENCOUNTER — Encounter: Admit: 2024-07-26 | Discharge: 2024-07-26 | Payer: MEDICARE

## 2024-07-29 ENCOUNTER — Encounter: Admit: 2024-07-29 | Discharge: 2024-07-29 | Payer: MEDICARE

## 2024-07-30 ENCOUNTER — Ambulatory Visit: Admit: 2024-07-30 | Discharge: 2024-07-31 | Payer: MEDICARE

## 2024-07-30 ENCOUNTER — Encounter: Admit: 2024-07-30 | Discharge: 2024-07-30 | Payer: MEDICARE

## 2024-07-30 VITALS — BP 112/68 | HR 86 | Ht 71.0 in | Wt 208.0 lb

## 2024-07-30 DIAGNOSIS — Z9689 Presence of other specified functional implants: Secondary | ICD-10-CM

## 2024-07-30 DIAGNOSIS — M5417 Radiculopathy, lumbosacral region: Principal | ICD-10-CM

## 2024-07-30 DIAGNOSIS — M792 Neuralgia and neuritis, unspecified: Secondary | ICD-10-CM

## 2024-07-30 DIAGNOSIS — Z9682 Presence of neurostimulator: Secondary | ICD-10-CM

## 2024-07-30 MED ORDER — GABAPENTIN 300 MG PO CAP
300 mg | ORAL_CAPSULE | Freq: Three times a day (TID) | ORAL | 3 refills | 30.00000 days | Status: AC
Start: 2024-07-30 — End: ?

## 2024-07-30 NOTE — Progress Notes [1]
 [facility] Interventional [facility]  Subjective     Chief Complaint: Pain  Chief Complaint   Patient presents with    Lower Back - Pain    Right Leg - Pain     Outside  of leg stops at calf    Follow Up       HPI: Johnny [name] is a 78 y.o. 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), 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), Joint pain (1966), Memory loss (this year mostly), Mitral regurgitation, Myasthenia gravis (CMS-HCC) (2023 - father also had MG), Myocardial infarction (CMS-HCC) (2006), NSVT (nonsustained ventricular tachycardia) (CMS-HCC), OAB (overactive bladder), Obesity, Class I, 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 who presents for evaluation.    History of Present Illness  Patient is a 78 year old male with chronic low back pain who presents for follow-up after a recent steroid injection.    He is here for follow-up after a right L3 to L5 transforaminal steroid injection performed on July 02, 2024. The injection provided minimal relief, with slight improvement the day after the procedure, but no significant pain relief beyond two days.    He continues to experience persistent low back pain, which is his primary complaint. The pain radiates to his right lower leg, extending from just below the kneecap to the ankle. He describes the leg as becoming sore every night, to the point where he fears it might give out, potentially causing him to trip or fall. This discomfort significantly impacts his ability to move around, particularly at night.    He mentions using Tylenol  for pain management. He is frustrated with the lack of relief from current treatments.    He has a Nalu neurostimulator for knee pain, which he has not had adjusted in a long time. He notes that when the device is off, his pain worsens, indicating that it provides some relief, though he is uncertain if it is as effective as it could be. He reports about 40% relief.    He also has Nevro SCS, reports 40% relief  He had TL spine xray on 06/07/24, xray without lead fracture, kinks or discontinuity noted      Patient denies any recent fevers, chills, infection, antibiotics, bowel or bladder incontinence, saddle anesthesia. +Warfarin for a. fib.  Recent diagnosis of Parkinson's      PRIOR MEDICATIONS:   Effective  Acetaminophen  (little)  Flexeril   Gabapentin     Ineffective  Tizanidine       Unable to tolerate  NSAID    Never  Lyrica  Ami/Nortriptyline  Cymbalta      PRIOR INTERVENTIONS:  L-spine surgery with hardware L2-3 L3-4 and then later L4-5 and L5-1 next (2 surgeries, most recent in 2008) Right TKA with revision    Effective  Bilateral SIJ (OSH, good benefit)  Left L5-S2 TFESI  right knee RFA - genicular  bilateral L4-L5 - transient  Ineffective  Rt L3-L5  ESI x3 (OSH)  Lumbar RFA      Past Medical History:  Past Medical History:    Back pain    Bronchiectasis (CMS-HCC)    CAD (coronary artery disease)    Carpal tunnel syndrome    Congestive heart disease (CMS-HCC)    Constipation    COPD (chronic obstructive pulmonary disease) (CMS-HCC)    Degenerative disc disease, lumbar    Dysphagia    Dysphonia    Erectile dysfunction, vasculogenic    Fracture    Fracture, ribs    GERD (gastroesophageal reflux disease)    Hearing reduced    Heart attack (CMS-HCC)    Hiatal hernia    HX: anticoagulation    Hyperlipemia    Hypertension    ICD (implantable cardiac defibrillator) in place    Ischemic cardiomyopathy    Joint pain    Memory loss    Mitral regurgitation    Myasthenia gravis (CMS-HCC)    Myocardial infarction (CMS-HCC)    NSVT (nonsustained ventricular tachycardia) (CMS-HCC)    OAB (overactive bladder)    Obesity, Class I, 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)    OSA on CPAP    Osteoarthritis    PAF (paroxysmal atrial fibrillation) (CMS-HCC)    Parkinson disease (CMS-HCC)    Pre-diabetes    Pulmonary emphysema (CMS-HCC)    Pulmonary fibrosis (CMS-HCC)    Pulmonary nodules    S/P ICD (internal cardiac defibrillator) procedure    Seasonal allergic reaction    Spinal stenosis    Ulcer    Unspecified sinusitis (chronic)       Family History:  Family History   Problem Relation Name Age of Onset    Cancer Father Yoandri     Heart problem Father Walker     Heart Disease Father Osmel     Hypertension Mother Heron     Joint Pain Mother Heron     Cancer Mother Heron     Cancer Sister Lindwood     Arthritis Mother Trisha Morandi    Allergy-severe Mother Heron     Diabetes Maternal Grandmother Ava         35       Social History:  Lives in [place] [id number] (1.25 hours away)    Social History     Socioeconomic History    Marital status: Married     Spouse name: Aldona    Number of children: 4   Tobacco Use    Smoking status: Never    Smokeless tobacco: Never   Vaping Use    Vaping status: Never Used   Substance and Sexual Activity    Alcohol use: Not Currently     Comment: very rarely 4 drinks per year    Drug use: Never    Sexual activity: Yes     Partners: Female     Birth control/protection: Surgical Allergies:  Allergies   Allergen Reactions    Clarithromycin RASH     [place] and burns both ends of GI tract    Oxycodone  MENTAL STATUS CHANGES     Nightmares  Tolerates Tramadol     Ketoconazole SEE COMMENTS     Had bloodshot eyes after use, and sore.  Happens if he gets drug in his eyes.  Tolerates shampoo fine.    Lisinopril COUGH       Medications:    Current Outpatient Medications:     albuterol  (VENTOLIN  HFA,  PROAIR  HFA) 90 mcg/actuation inhaler, Inhale two puffs by mouth into the lungs four times daily as needed for Wheezing., Disp: , Rfl:     aspirin  81 mg chewable tablet, Chew one tablet by mouth at bedtime daily., Disp: , Rfl:     carbidopa -levodopa  (SINEMET ) 25-100 mg tablet, Take two tablets by mouth three times daily. (Patient taking differently: Take two tablets by mouth three times daily. Take 2 tablets in the morning and 1 tablet 4 more times through out the day), Disp: 540 tablet, Rfl: 3    cetirizine  (ZYRTEC ) 10 mg tablet, Take one tablet by mouth daily., Disp: , Rfl:     cholecalciferol (Vitamin D3) (VITAMIN D-3) 1,000 units tablet, Take one tablet by mouth twice daily.  , Disp: , Rfl:     clopiDOGreL (PLAVIX) 75 mg tablet, Take one tablet by mouth daily. Indications: amulet, Disp: 90 tablet, Rfl: 1    coenzyme Q10 200 mg capsule, Take one capsule by mouth daily., Disp: , Rfl:     cyanocobalamin (vitamin B-12) 500 mcg tablet, Take one tablet by mouth daily., Disp: , Rfl:     cyclobenzaprine  (FLEXERIL ) 10 mg tablet, Take one tablet by mouth three times daily as needed for Muscle Cramps., Disp: 90 tablet, Rfl: 3    dextran 70-hypromellose (PF) (ARTIFICIAL TEARS (PF)) 0.1/0.3 % ophthalmic solution, Apply one drop to both eyes every 4 hours as needed for Dry Eyes., Disp: , Rfl:     ezetimibe  (ZETIA ) 10 mg tablet, Take one tablet by mouth at bedtime daily., Disp: 90 tablet, Rfl: 3    famotidine  (PEPCID ) 40 mg tablet, TAKE 1 TABLET BY MOUTH EVERY DAY (Patient taking differently: Take one tablet by mouth at bedtime daily.), Disp: 90 tablet, Rfl: 0    fesoterodine  ER (TOVIAZ ) 4 mg tablet, Take one tablet by mouth daily. Do not cut/ crush/ chew, Disp: 90 tablet, Rfl: 0    fluticasone -umeclidin-vilanter (TRELEGY ELLIPTA) 100-62.5-25 mcg inhaler, Inhale 1 Dose by mouth into the lungs daily after dinner., Disp: , Rfl:     furosemide  (LASIX ) 40 mg tablet, Take 2 tabs on Mon-Wed-Fri  Take 3 tabs on Sun-Tues-Thur-Sat, Disp: 226 tablet, Rfl: 2    gabapentin  (NEURONTIN ) 300 mg capsule, Take one capsule by mouth three times daily., Disp: 270 capsule, Rfl: 3    glucosamine su 2KCl-chondroit 500-400 mg tab, Take 1 Tab by mouth twice daily., Disp: , Rfl:     ketoconazole (NIZORAL) 2 % topical shampoo, Apply  topically to affected area every 7 days. Apply topically to affected area of damp skin, lather, leave on 5 minutes, and rinse., Disp: , Rfl:     losartan  (COZAAR ) 50 mg tablet, TAKE 1 TABLET BY MOUTH EVERY DAY (Patient taking differently: Take one tablet by mouth at bedtime daily.), Disp: 90 tablet, Rfl: 3    metoprolol  succinate XL (TOPROL  XL) 100 mg extended release tablet, TAKE 1 TABLET BY MOUTH EVERY DAY, Disp: 90 tablet, Rfl: 3    mucus clearing device (AEROBIKA OSCILLATING PEP SYSTM MISC), Use  as directed. Use with salt water  and inhale twice daily, Disp: , Rfl:     other medication, one Dose. Mupirocin dissolved in warm water  and Budesonide in NS  qhs  (sometimes increases to BID), Disp: , Rfl:     pantoprazole  DR (PROTONIX ) 40 mg tablet, Take one tablet by mouth daily., Disp: 30 tablet, Rfl: 5    polyethylene glycol 3350  (MIRALAX ) 17 g packet, Take one packet by mouth daily as needed.,  Disp: , Rfl:     potassium chloride  SR (K-DUR) 20 mEq tablet, TAKE 2 TABLETS BY MOUTH MONDAY, WEDNESDAY AND FRIDAY AND TAKE 1 TABLET ALL OTHER DAYS (Patient taking differently: TAKE 1 TABLETS BY MOUTH MONDAY, WEDNESDAY AND FRIDAY AND TAKE 2 TABLETS ALL OTHER DAYS), Disp: 140 tablet, Rfl: 1    predniSONE (DELTASONE) 5 mg tablet, Take one tablet by mouth daily with breakfast., Disp: , Rfl:     pyRIDostigmine bromide (MESTINON) 60 mg tablet, Take two tablets by mouth three times daily.  , Disp: , Rfl:     roflumilast (DALIRESP) 500 mcg tablet, Take one tablet by mouth daily., Disp: , Rfl:     rosuvastatin  (CRESTOR ) 40 mg tablet, TAKE 1 TABLET BY MOUTH EVERY NIGHT AT BEDTIME, Disp: 90 tablet, Rfl: 0    sodium chloride  (HYPER-SAL) 7 % nebulizer solution, Inhale 4 mL solution by nebulizer as directed daily., Disp: , Rfl:     spironolactone  (ALDACTONE ) 25 mg tablet, TAKE 1 TABLET BY MOUTH EVERY DAY WITH FOOD, Disp: 90 tablet, Rfl: 3    tamsulosin  (FLOMAX ) 0.4 mg capsule, Take one capsule by mouth daily., Disp: , Rfl:     testosterone cypionate 200 mg/mL kit, Inject 1 mL into the muscle every 14 days., Disp: , Rfl:     traZODone (DESYREL) 100 mg tablet, Take 1.5 tablets by mouth at bedtime daily., Disp: , Rfl:     Vacuum Erection Device System kit, Use as directed for sexual activity., Disp: 1 kit, Rfl: 11    Physical examination:   BP 112/68  - Pulse 86  - Ht 180.3 cm (5' 11)  - Wt 94.3 kg (208 lb)  - SpO2 97%  - BMI 29.01 kg/m?   Pain Score: Eight    Physical Exam:  General: The patient is a well-developed, well nourished 78 y.o. male in no acute distress.   HEENT: Head is normocephalic and atraumatic.   Extremities: No ankle or wrist edema.     Neurologic:   The patient is alert and oriented times 3.     Musculoskeletal:   Gait is antalgic - cane    TL spine xray  06/07/24    1.  Spinal cord stimulator leads noted, without fracture, kink, or focal   discontinuity identified. The superior aspect of the radiopaque lead tips   project over the T8 and T9 levels, respectively.     2.  Mild left convex lumbar spinal curvature with multiple posterior   interspinous fixation device is in place.     L spine     There is mild-moderate low lumbar paraspinal tenderness. Paraspinal muscle tone is increased.  Facet loading is positive  ROM with flexion, extension, rotation, and lateral bending is intact yet stiff.  Strength is equal and adequate bilaterally in the flexors and extensors of the bilateral lower extremities at this time.  SLE is negative bilaterally      Results for orders placed during the hospital encounter of 08/24/20    MRI L-SPINE WO/W CONTRAST    Addendum 08/25/2020  5:39 PM  Finalized by Vicenta Moose, M.D. on 08/24/2020 5:00 PM. Dictated by Morene Hopes, M.D. on 08/24/2020 3:54 PM.Addendum:  Morene Hopes discussed these findings via telephone with Dr. Colen nurse, Stephane Reasoner, at 8:34 AM on 08/25/2020.      Approved by Morene Hopes, M.D. on 08/25/2020 8:35 AM    By my electronic signature, I attest that I have personally reviewed the images for this examination and  formulated the interpretations and opinions expressed in this report      Finalized by Vicenta Moose, M.D. on 08/25/2020 5:36 PM. Dictated by Morene Hopes, M.D. on 08/25/2020 8:33 AM.    Narrative  EXAM: MRI L-SPINE    HISTORY:    , lumbar radiculapathy,    Technique: Multiple sagittal and axial MR sequences were obtained of the lumbar spine with and without MultiHance  contrast.    Comparison: CT lumbar spine August 27, 2014    FINDINGS:    There are 5 nonrib-bearing lumbar type vertebral bodies. There is left convexity lumbar spinal curvature. Trace retrolisthesis at L2-L3 and trace anterolisthesis of L5-S1. X-Stop devices are seen at the spinous processes of the lumbar spine. Vertebral body heights are maintained. Multilevel vertebral endplate marrow changes. No suspicious bone marrow replacing lesion. The conus is normal in appearance and position at the T12-L1 level. abnormal enhancing lesion is identified. Bilateral renal atrophy. Small upper pole right renal cyst. There is an additional 1.3 cm upper pole right renal lesion which demonstrates T2 and T1 hypointensity (series 5, image 8).    T12-L1: No significant central spinal or neuroforaminal stenosis.    L1-L2: Marked disc degeneration, mild circumferential disc bulge, and facet hypertrophy. Mild central spinal stenosis. Moderate left and marked right neural foraminal stenosis.    L2-L3: Trace retrolisthesis, moderate disc degeneration, circumferential disc bulge, facet hypertrophy, and mild malignant flavum thickening. Mild central spinal stenosis. Moderate left and marked right neuroforaminal stenosis.    L3-L4: Moderate disc degeneration, circumferential disc bulge, and facet hypertrophy. Moderate to marked spinal stenosis. Marked bilateral neuroforaminal stenosis.    L4-L5: Mild disc degeneration, circumferential disc bulge with superimposed central disc protrusion, ligamentum flavum thickening, and facet hypertrophy. Moderate to marked central spinal stenosis. Marked bilateral neural foraminal stenosis.    L5-S1: Mild disc degeneration, circumferential disc bulge, and facet hypertrophy. Moderate central spinal stenosis. Marked bilateral neural foraminal stenosis.    Impression  1.  Multilevel degenerative central spinal stenosis, greatest of moderate to marked degree at L3-L4, L4-L5, and L5-S1.  2.  Multilevel degenerative neuroforaminal stenosis, greatest of marked degree on the right at L1-L2 and L2-L3 as well as bilaterally at L3-L4, L4-5, and L5-S1.  3.  Left convexity lumbar spinal curvature.  4.  Trace retrolisthesis of L2-L3 and trace anterolisthesis of L5-S1.  5.  T2 and T1 hypointense peripheral right renal lesion. MRI abdomen (renal mass protocol) is recommended for further evaluation.              Last Cr and LFT's:  Creatinine   Date Value Ref Range Status   07/09/2024 1.10 0.40 - 1.24 mg/dL Final     AST (SGOT)   Date Value Ref Range Status   11/22/2018 25 7 - 40 U/L Final     ALT (SGPT)   Date Value Ref Range Status   11/22/2018 24 7 - 56 U/L Final     Alk Phosphatase   Date Value Ref Range Status   11/22/2018 51 25 - 110 U/L Final     Total Bilirubin   Date Value Ref Range Status 11/22/2018 0.8 0.3 - 1.2 MG/DL Final          Assessment:    Oval Cavazos is a 78 y.o. 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), 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), Joint pain (1966), Memory loss (this year mostly), Mitral regurgitation, Myasthenia gravis (CMS-HCC) (2023 - father also had MG), Myocardial infarction (CMS-HCC) (2006), NSVT (nonsustained ventricular tachycardia) (CMS-HCC), OAB (overactive bladder), Obesity, Class I, 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 who presents for evaluation of pain.    The pain complaints are most likely due to:    1. Lumbosacral radiculopathy        2. Neuropathic pain        3. Spinal cord stimulator status        4. Peripheral nerve neurostimulator device in situ                Patient has had an adequate trial of > 3 months of rest, exercise, multimodal treatment, and the passage of time without improvement of symptoms. The pain has significant impact on the daily quality of life.     Plan:     Will discuss with Dr Lazarus for the next plan of care. He did not have relief from Rt t L3-5 TFESI   He will try Gabapentin  300 mg TID again,he was not taking this consistently and discussed the importance of taking this on a consistent schedule. Rx sent to his Pharmacy  I reached out to Nalu and Nevro rep. They will touch base with patient about reprogramming and stim optimization.  4.  Follow up as needed.     Risks/benefits of all pharmacologic and interventional treatments discussed and questions answered.

## 2024-07-31 ENCOUNTER — Ambulatory Visit: Admit: 2024-07-31 | Discharge: 2024-08-01 | Payer: MEDICARE

## 2024-07-31 ENCOUNTER — Encounter: Admit: 2024-07-31 | Discharge: 2024-07-31 | Payer: MEDICARE

## 2024-07-31 DIAGNOSIS — J383 Other diseases of vocal cords: Principal | ICD-10-CM

## 2024-07-31 NOTE — Progress Notes [1]
 [facility]  Voice Therapy [phone])  Staff: Dr. Lynwood Churn, M.D.  Date: 07/31/24    S  Johnny [name] was referred to the [facility] at the request of Dr. Chiquita Ship for voice evaluation and treatment. The patient's medical history is significant for PD as well as seronegative, ocular MG. On 04/15/24, he was seen by myself for a videostroboscopy: The bilateral vocal fold edges appeared bowed, with prominent vocal processes. During phonation, these vocal processes scissored. There was incomplete glottic closure; however, this improved with cues for increased airflow.  Supraglottic hyperfunction was appreciated, namely mild-moderate MTD type II/III. Tremulous motion of the larynx was appreciated with phonation. On 05/28/24, he underwent an an injection augmentation. Please see the full report in the patient's chart for complete details. He presents with a diagnosis of vocal cord bowing.    Per the initial evaluation, the voice is raspy/breathy. Johnny [name] says that sometimes he can't talk quietly, as it will cut in and out into a whisper-like sound. Johnny [name] believes that his voice is better when he talks loudly. He has a hard time singing in church; he described what sounded like intermittent aphonic pitch breaks when changing pitch. He was seen by SLP at [facility] and injection augmentation was recommended. He has not done LSVT. He denies difficulty projecting; however, his wife notes that she has trouble hearing Johnny [name] when he calls from the other room. He can sometimes have a sore throat from severe coughing, secondary to COPD.    Johnny [name] presents for their first voice therapy session. Today, the patient reports that his voice has slightly improved recently.    O  The patient presented today with a voice characterized by mild-moderately increased roughness and mildly increased breathiness.     Long-term goal:  The patient will improve functional communication in all communication environments given minimal support as measured by self-report.    Short-term goals:     The patient will demonstrate understanding of normal and abnormal phonation. GOAL NOT ADDRESSED     The patient will enhance understanding and adherence to vocal hygiene guidelines. They will identify good vocal hygiene practices with over 90% accuracy. GOAL NOT ADDRESSED     The patient will demonstrate optimal coordination of respiration and phonation during structured and conversational speech tasks with 90% accuracy. GOAL NOT ADDRESSED     The patient will demonstrate a front and forward focus during voicing with 90% accuracy during structured and conversational speech tasks. INITIATED - The patient performed exercises at the following levels and accuracies:  Conversation: about 75% accuracy for reduced roughness/increased volume after training BTG/eBTG and intonation training per CTT. He completed positive/negative training at the conversational level. He did not self-correct this date, but was stimulable for improvement provided knowledge of performance/direct cues (use crisp clear consonants or keep your voice going up and down).    A  Johnny [name] demonstrates dysphonia characterized by mild-moderately increased roughness and mildly increased breathiness.  Following implementation of the aforementioned voice therapy techniques, the voice had increased volume and reduced roughness, albeit with persistent breathiness. The patient reports high compliance with recommendations provided in the previous session. Based upon compliance, motivation and response to today's session, prognosis for continued improvement with therapy is good.    Key points of today's session:  Johnny [name] was encouraged to train at the reading level.    P  It was recommended that the patient continue to complete voice exercises at least  twice daily. He plans to return to clinic in 2 weeks to continue to work on the above goals. Johnny [name] was encouraged to contact me with any further questions or concerns.    The ENT speech pathology team plans to follow this patient for any potential and/orongoing outpatient voice, swallowing, speech, and/or upper-airway related care, as appropriate. Should the patient's plan of care require skills outside of the ENT speech team's specialty, the patient may be referred to outside services.

## 2024-08-05 ENCOUNTER — Encounter: Admit: 2024-08-05 | Discharge: 2024-08-05 | Payer: MEDICARE

## 2024-08-05 ENCOUNTER — Ambulatory Visit: Admit: 2024-08-05 | Discharge: 2024-08-05 | Payer: MEDICARE

## 2024-08-06 ENCOUNTER — Encounter: Admit: 2024-08-06 | Discharge: 2024-08-06 | Payer: MEDICARE

## 2024-08-14 ENCOUNTER — Encounter: Admit: 2024-08-14 | Discharge: 2024-08-14 | Payer: MEDICARE

## 2024-08-14 DIAGNOSIS — J383 Other diseases of vocal cords: Secondary | ICD-10-CM

## 2024-08-20 NOTE — Progress Notes [1]
 Johnny [name] is a 78 y.o. patient seen via telemedicine today. They are a patient of Dr.     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 In early 2024, EGD showed a 1 mm island of salmon-colored mucosa suspicious for Barrett?s, hiatal hernia, fundic polyps, and anal canal condyloma; colonoscopy in 2022 revealed rectal diverticula and hypertrophied anal papillae [2]. He reported persistent upper abdominal discomfort, heartburn, regurgitation, and constipation, managed with fiber, laxatives, and pelvic floor PT [2]., but he denied gastroparesis symptoms [1]. SIBO testing was planned [1].   A repeat EGD was cone in March 2025 without evidence of Barrett's esophagus. Biopies were negative for fungal organisms, HSV and CMV were negative.  Mild acute inflammation was noted.   A medium amount of food redisue in the stomach but patient denied gastroparesis symptoms.  He has a history of chronic constipation with fecal urgency so SIBO testing was planned at his last office visit in August, 2025. He presents for a follow-up on gastrointestinal symptoms including chronic constipation, fecal urgency and occasional incontinence and GERD.     Since his last visit he has been managing his constipation with increased dietary fiber and a daily scoop of Benefiber, which provides daily soft bowel movements with occasional straining and no blood or black stool. He denies any abdominal pain or bloating with this adjustment.  He has bowel urgency and occasional leakage with passing gas. He previously completed pelvic floor therapy and uses the exercises, which help, though urgency has increased recently, including two episodes yesterday. He lost his handout on PV PT exercises and is requesting a replacement handout so that he can start working on strengthening his pelvic floor.     For GERD, he takes pantoprazole  in the morning and famotidine  at night. An EGD in March 2025 showed no Barrett's esophagus and negative biopsies but did show a medium amount of food residue in his stomach.  He continues to deny any early satiety, nausea or vomiting.  He has occasional heartburn with overeating for which he takes an occasional Rolaids with relief. He has had a few episodes of difficulty swallowing pills, which improves with increased water  and spacing out pills. He notes that he had been taking a full handful of pills at a time but since he separates them, he does not have difficulty swallowing.  No dysphagia with liquids or foods.       Objective:          albuterol  (VENTOLIN  HFA, PROAIR  HFA) 90 mcg/actuation inhaler Inhale two puffs by mouth into the lungs four times daily as needed for Wheezing.    aspirin  81 mg chewable tablet Chew one tablet by mouth at bedtime daily.    carbidopa -levodopa  (SINEMET ) 25-100 mg tablet Take two tablets by mouth three times daily. (Patient taking differently: Take two tablets by mouth three times daily. Take 2 tablets in the morning and 1 tablet 4 more times through out the day)    cetirizine  (ZYRTEC ) 10 mg tablet Take one tablet by mouth daily.    cholecalciferol (Vitamin D3) (VITAMIN D-3) 1,000 units tablet Take one tablet by mouth twice daily.      clopiDOGreL  (PLAVIX ) 75 mg tablet Take one tablet by mouth daily. Indications: amulet  coenzyme Q10 200 mg capsule Take one capsule by mouth daily.    cyanocobalamin (vitamin B-12) 500 mcg tablet Take one tablet by mouth daily.    cyclobenzaprine  (FLEXERIL ) 10 mg tablet Take one tablet by mouth three times daily as needed for Muscle Cramps.    dextran 70-hypromellose (PF) (ARTIFICIAL TEARS (PF)) 0.1/0.3 % ophthalmic solution Apply one drop to both eyes every 4 hours as needed for Dry Eyes.    ezetimibe  (ZETIA ) 10 mg tablet Take one tablet by mouth at bedtime daily.    famotidine  (PEPCID ) 40 mg tablet TAKE 1 TABLET BY MOUTH EVERY DAY (Patient taking differently: Take one tablet by mouth at bedtime daily.)    fesoterodine  ER (TOVIAZ ) 4 mg tablet Take one tablet by mouth daily. Do not cut/ crush/ chew    fluticasone -umeclidin-vilanter (TRELEGY ELLIPTA) 100-62.5-25 mcg inhaler Inhale 1 Dose by mouth into the lungs daily after dinner.    furosemide  (LASIX ) 40 mg tablet Take 2 tabs on Mon-Wed-Fri  Take 3 tabs on Sun-Tues-Thur-Sat    gabapentin  (NEURONTIN ) 300 mg capsule Take one capsule by mouth three times daily.    glucosamine su 2KCl-chondroit 500-400 mg tab Take 1 Tab by mouth twice daily.    ketoconazole (NIZORAL) 2 % topical shampoo Apply  topically to affected area every 7 days. Apply topically to affected area of damp skin, lather, leave on 5 minutes, and rinse.    losartan  (COZAAR ) 50 mg tablet TAKE 1 TABLET BY MOUTH EVERY DAY (Patient taking differently: Take one tablet by mouth at bedtime daily.)    metoprolol  succinate XL (TOPROL  XL) 100 mg extended release tablet TAKE 1 TABLET BY MOUTH EVERY DAY    mucus clearing device (AEROBIKA OSCILLATING PEP SYSTM MISC) Use  as directed. Use with salt water  and inhale twice daily    other medication one Dose. Mupirocin dissolved in warm water  and Budesonide in NS  qhs  (sometimes increases to BID)    pantoprazole  DR (PROTONIX ) 40 mg tablet Take one tablet by mouth daily.    polyethylene glycol 3350  (MIRALAX ) 17 g packet Take one packet by mouth daily as needed.    potassium chloride  SR (K-DUR) 20 mEq tablet TAKE 2 TABLETS BY MOUTH MONDAY, WEDNESDAY AND FRIDAY AND TAKE 1 TABLET ALL OTHER DAYS (Patient taking differently: TAKE 1 TABLETS BY MOUTH MONDAY, WEDNESDAY AND FRIDAY AND TAKE 2 TABLETS ALL OTHER DAYS)    predniSONE  (DELTASONE ) 5 mg tablet Take one tablet by mouth daily with breakfast.    pyRIDostigmine  bromide (MESTINON ) 60 mg tablet Take two tablets by mouth three times daily.      roflumilast  (DALIRESP ) 500 mcg tablet Take one tablet by mouth daily.    rosuvastatin  (CRESTOR ) 40 mg tablet TAKE 1 TABLET BY MOUTH EVERY NIGHT AT BEDTIME    sodium chloride  (HYPER-SAL) 7 % nebulizer solution Inhale 4 mL solution by nebulizer as directed daily.    spironolactone  (ALDACTONE ) 25 mg tablet TAKE 1 TABLET BY MOUTH EVERY DAY WITH FOOD    tamsulosin  (FLOMAX ) 0.4 mg capsule Take one capsule by mouth daily.    testosterone cypionate 200 mg/mL kit Inject 1 mL into the muscle every 14 days.    traZODone  (DESYREL ) 100 mg tablet Take 1.5 tablets by mouth at bedtime daily.    Vacuum Erection Device System kit Use as directed for sexual activity.     There were no vitals filed for this visit.  There is no height or weight on file to calculate BMI.  Physical Exam  Constitutional:       Appearance: Normal appearance.   HENT:      Head: Normocephalic and atraumatic.   Pulmonary:      Effort: Pulmonary effort is normal.   Neurological:      Mental Status: He is alert.   Psychiatric:         Mood and Affect: Mood normal.         Behavior: Behavior normal.          LABS    CBC w diff    Lab Results   Component Value Date/Time    WBC 10.60 07/09/2024 03:42 AM    RBC 4.59 07/09/2024 03:42 AM    HGB 12.7 (L) 07/09/2024 03:42 AM    HCT 38.2 (L) 07/09/2024 03:42 AM    MCV 83.3 07/09/2024 03:42 AM    MCH 27.8 07/09/2024 03:42 AM    MCHC 33.3 07/09/2024 03:42 AM    RDW 19.8 (H) 07/09/2024 03:42 AM    PLTCT 160 07/09/2024 03:42 AM    MPV 7.8 07/09/2024 03:42 AM    Lab Results   Component Value Date/Time    NEUT 92 (H) 11/22/2018 02:59 PM    ANC 13.00 (H) 11/22/2018 02:59 PM    LYMA 5 (L) 11/22/2018 02:59 PM    ALC 0.70 (L) 11/22/2018 02:59 PM    MONA 3 (L) 11/22/2018 02:59 PM    AMC 0.50 11/22/2018 02:59 PM    EOSA 0 11/22/2018 02:59 PM    AEC 0.00 11/22/2018 02:59 PM    BASA 0 11/22/2018 02:59 PM    ABC 0.00 11/22/2018 02:59 PM         Comprehensive Metabolic Profile    Lab Results   Component Value Date/Time NA 140 07/09/2024 03:42 AM    K 3.8 07/09/2024 03:42 AM    CL 102 07/09/2024 03:42 AM    GLU 138 (H) 07/09/2024 03:42 AM    BUN 26 (H) 07/09/2024 03:42 AM    CR 1.10 07/09/2024 03:42 AM    CA 8.9 07/09/2024 03:42 AM    TOTPROT 5.4 (L) 11/22/2018 02:59 PM    Lab Results   Component Value Date/Time    TOTBILI 0.8 11/22/2018 02:59 PM    ALBUMIN 4.0 07/08/2024 12:05 PM    ALKPHOS 51 11/22/2018 02:59 PM    AST 25 11/22/2018 02:59 PM    CO2 31 (H) 07/09/2024 03:42 AM    ALT 24 11/22/2018 02:59 PM    GAP 7 07/09/2024 03:42 AM    EGFR1 58 (L) 03/31/2021 04:22 PM           Results  EGD 11/2023: No evidence of Barretts.  1 cm hiatal hernia.  A medium amount of food redisue in the stomach.  Biopies were negative for fungal organisms, HSV and CMV were negative.  Mild acute inflammation was noted.     Prior endoscopic workup  EGD at [facility] in 2019 with noted hiatal hernia per his report, uncertain about Barrett's diagnosis, remembers there being some chronic changes but did not tell him that he needed any routine follow up.    Colonoscopy approximately ~2022 with a recommendation for a follow-up in 5 years. The patient has not reported any significant changes in bowel habits since these procedures.  He underwent 09/30/2022 EGD 1 mm island of salmon-colored mucosa proximal to the distal esophagus suspicious for Barrett's, 1 cm hiatal hernia, multiple small gastric fundic polyps, normal duodenum through third portion.  09/2022 Flexible sigmoidoscopy at that time revealed extensive amounts of stool precluding visualization.  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  Hypertrophied anal papilla.        Assessment & Plan  Johnny [name] is a 78 y.o. male patient of Dr. Leonce with past medical history of gastroesophageal reflux disease (GERD), atrial fibrillation (AFib), and chronic obstructive pulmonary disease (COPD) returns to clinic for continued management of GERD and constipation. After prolonged discussion, the following mutually agreed upon plan was devised:     Chronic constipation  Well-managed with fiber and hydration. Regular, soft bowel movements. No significant symptoms. SIBO test canceled due to improvement.  - Continue Benefiber, one scoop daily  - Increase Benefiber if bowel movements become irregular.  - Ensure adequate hydration.    Gastroesophageal reflux disease  GERD controlled with pantoprazole  and famotidine . Occasional heartburn with overeating. EGD 11/2023 negative for Barrett's esophagus.  - Continue pantoprazole  in the morning and famotidine  at night.    Pelvic floor dysfunction with fecal incontinence  Suspected due to urgency and fecal incontinence. Managed with pelvic floor exercises.   - Continue pelvic floor exercises regularly.  - Send pelvic floor exercise handout via MyChart.  - Consider referral to pelvic floor therapy if symptoms worsen.    FU 6 mo with myself.     Assessment & Plan  Chronic constipation         Gastroesophageal reflux disease, unspecified whether esophagitis present         Incontinence of feces, unspecified fecal incontinence type                 Total Time Today was 30 minutes in the following activities: Preparing to see the patient, Obtaining and/or reviewing separately obtained history, Performing a medically appropriate examination and/or evaluation, Counseling and educating the patient/family/caregiver, Ordering medications, tests, or procedures, Referring and communication with other health care professionals (when not separately reported), Documenting clinical information in the electronic or other health record, and Care coordination (not separately reported)

## 2024-08-21 ENCOUNTER — Encounter: Admit: 2024-08-21 | Discharge: 2024-08-21 | Payer: MEDICARE

## 2024-08-21 ENCOUNTER — Ambulatory Visit: Admit: 2024-08-21 | Discharge: 2024-08-22 | Payer: MEDICARE

## 2024-08-21 DIAGNOSIS — K219 Gastro-esophageal reflux disease without esophagitis: Secondary | ICD-10-CM

## 2024-08-21 DIAGNOSIS — R159 Full incontinence of feces: Secondary | ICD-10-CM

## 2024-08-21 DIAGNOSIS — K5909 Other constipation: Principal | ICD-10-CM

## 2024-08-21 NOTE — Telephone Encounter [36]
 I called patient. He was not available. I spoke to his wife. We discussed that the next plan of care will be referral to Ortho Spine for surgical evaluation. As he failed the epidural injections. Then we will re evaluate if he is not a surgical candidate. We might need to obtain new L spine MRI, if he wants the referral.    His wife reports that the patient is feeling much better since he started taking the Gabapentin  on a regular basis and after Nevro reprogramming. Wife would like to hold off on the surgical referral. She will call us  if they change their mind in the future.

## 2024-08-21 NOTE — Telephone Encounter [36]
-----   Message from Rupert Donovan, MD sent at 08/18/2024 11:28 PM CST -----    Lyrica trial is reasonable.     Let's get a surg consult.     If they have nothing to offer, then we can re-evaluate.  ----- Message -----  From: Samie Tonda MATSU, APRN-NP  Sent: 08/13/2024   1:30 PM CST  To: Rupert Donovan, MD    He has RLE radiculopathy    The pain radiates to his right lower leg, extending from just below the kneecap to the ankle. He describes the leg as becoming sore every night, to the point where he fears it might give out, potentially causing him to trip or fall.    For Lyrica trial, his last creatinine was 1.10 and 1.30 prior  ----- Message -----  From: Donovan Rupert, MD  Sent: 08/12/2024  10:37 PM CST  To: Tonda MATSU Samie, APRN-NP      If the low back pain is axial and the leg/knee pain is seaprate, then let's review MRI to see if he is ViaDisc candidate.     Also, consider change to Lyrica trial.     If no progress, spine surg consult can be considered, but they probably won't suggest surgery is my guess.  ----- Message -----  From: Samie Tonda MATSU, APRN-NP  Sent: 07/31/2024   9:48 AM CST  To: Rupert Donovan, MD    What other options do we have for Mr Temme?  He failed the  right L3 to L5 transforaminal steroid injection   He gets 40% relief from Nevro and 40% relief from Nalu.  Reps will reach out to him for stim optimization.    Thanks,  [place]

## 2024-08-30 ENCOUNTER — Encounter: Admit: 2024-08-30 | Discharge: 2024-08-30 | Payer: MEDICARE

## 2024-09-02 ENCOUNTER — Encounter: Admit: 2024-09-02 | Discharge: 2024-09-02 | Payer: MEDICARE

## 2024-09-02 MED ORDER — FUROSEMIDE 40 MG PO TAB
ORAL_TABLET | ORAL | 2 refills | 90.00000 days | Status: AC
Start: 2024-09-02 — End: ?

## 2024-09-02 MED ORDER — ROSUVASTATIN 40 MG PO TAB
40 mg | ORAL_TABLET | Freq: Every evening | ORAL | 3 refills | 90.00000 days | Status: AC
Start: 2024-09-02 — End: ?

## 2024-09-03 ENCOUNTER — Encounter: Admit: 2024-09-03 | Discharge: 2024-09-03 | Payer: MEDICARE

## 2024-09-03 MED ORDER — METOPROLOL SUCCINATE 100 MG PO TB24
100 mg | ORAL_TABLET | Freq: Every day | ORAL | 1 refills | 90.00000 days | Status: AC
Start: 2024-09-03 — End: ?

## 2024-09-03 MED ORDER — LOSARTAN 50 MG PO TAB
50 mg | ORAL_TABLET | Freq: Every evening | ORAL | 1 refills | 90.00000 days | Status: AC
Start: 2024-09-03 — End: ?

## 2024-09-03 MED ORDER — FAMOTIDINE 40 MG PO TAB
40 mg | ORAL_TABLET | Freq: Every evening | ORAL | 3 refills | 90.00000 days | Status: AC
Start: 2024-09-03 — End: ?

## 2024-09-03 MED ORDER — SPIRONOLACTONE 25 MG PO TAB
25 mg | ORAL_TABLET | Freq: Every day | ORAL | 1 refills | 90.00000 days | Status: AC
Start: 2024-09-03 — End: ?

## 2024-09-03 NOTE — Telephone Encounter [36]
 All Protocol Elements met  Medication name: Famotidine   Medication Strength: 40 mg   Quantity: 90  Refills provided:3    Patient was last seen on 08/21/2024 and should be seen on or around 02/2025.

## 2024-09-20 ENCOUNTER — Encounter: Admit: 2024-09-20 | Discharge: 2024-09-20 | Payer: MEDICARE

## 2024-09-26 ENCOUNTER — Encounter: Admit: 2024-09-26 | Discharge: 2024-09-26 | Payer: MEDICARE

## 2024-09-26 NOTE — Telephone Encounter [36]
 Johnny [name] presented to the front desk to speak about speech therapy appointments. A return call was made.    Johnny [name] was unable to make speech therapy appointments, as his wife was in the hospital in December. A future telehealth appointment was scheduled.

## 2024-09-27 ENCOUNTER — Encounter: Admit: 2024-09-27 | Discharge: 2024-09-27 | Payer: MEDICARE

## 2024-09-28 ENCOUNTER — Encounter: Admit: 2024-09-28 | Discharge: 2024-09-29 | Payer: MEDICARE

## 2024-09-30 ENCOUNTER — Encounter: Admit: 2024-09-30 | Discharge: 2024-09-30 | Payer: MEDICARE

## 2024-09-30 NOTE — Telephone Encounter [36]
-----   Message from Veva HERO sent at 09/28/2024 11:35 AM CST -----  Regarding: Fluid retention, AF, NSVT  Remote transmission from received on 09/25/24 presents with the following:      Heart failure diagnostics assessed through the device  Status: Elevated since 07/30/24.      Stored EGMs are consistent with or suggestive of Atrial Fibrillation  AT/AF Burden: 3.7%  Total number of events: 5  Most recent on 09/18/25 @ 11:03am  Longest duration 15h 79m 26s.  V rates >110bpm ~15% of the time.  LAAC in situ (Amulet).    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    Complete report sent to chart for further detail/review.  Follow up as needed.      Thank you,  Terri/Device Team

## 2024-09-30 NOTE — Progress Notes [1]
 Report Sheet    TEE           Indication: 3 month post amulet (07/08/24)   Ordering Provider: Jess Burows    Name: Johnny [name]     Age: 79 y.o.    DOB: Jan 28, 1946     MRN: [id number]  Patient phone number: [phone]    Communication Barriers: none    Lab(s) needed: none  Other:      Device check needed: No  Device:   Lab Results   Component Value Date/Time    GENERATOR Medtronic 09/28/2024 11:40 AM    EPDEVTYP ICD 09/28/2024 11:40 AM       Other implanted devices/pumps: none  Has Controller (pt to bring): No    Pt Called: Yes spoke to patient and wife on phone, reviewed instructions. No questions  Arrival Time: 9:30 am or whenever done with office visit (9am)  Driver Information: Lolita- wife    Date of Last H&P: 07/23/24 - same day updated H&P with Resa Ruth   Additional Information: Catalyst Study    Prior TEE: 07/08/24     Prior DCCV: none at [facility]   Prior Echo:08/05/24       Previous TEE/DCCV details: Normal left ventricular systolic function with an EF of 60%.  Abnormal septal motion consistent with prior cardiac surgery, otherwise no regional wall motion abnormalities.  Normal right ventricular size and function.  Right sided device leads are noted.  There is evidence of prior mitral valve repair with an annuloplasty ring.  There appears to be partial dehiscence of the ring, it appears to traverse the mitral valve inflow at approximately A2 and P2.  However, there is no obstruction of flow, the mean gradient was 1 mmHg with a heart rate of 78.  There is trace MR.  Mild TR.  Aortic valve is calcified with decreased cusp mobility.  It was not assessed with spectral Doppler.  No AI.  The left atrial appendage was free of thrombus.  There was successful placement of a 28 mm amulet device.  After placement the device was well-seated.  There is no color flow around the disc.  Normal aortic root size.  There was mild plaquing in the thoracic aorta.  No pericardial effusion.  There is an epicardial fat pad noted. EF:   ECHO EF   Date Value Ref Range Status   08/05/2024 60 % Final       Anticoag:plavix      Dose:75mg      Frequency:daily     Missed:   Labs   INR    HGB    Platelets    Glucose    NA    K    Creatinine    Other      Medications to HOLD day of:  Vitamins/Supplements  Lasix   spironolactone     Probe   Gastric Surgery    GERD Y- protonix    Swallow difficulty    Head/Neck Surg/Rad    Chest Surg/Rad    EGD    Varices/Esophag CA    GI bleed    Dental issues      Sedation   COPD    OSA/CPAP    Asthma    Pulm HTN    Anesthesia Issues    Smoker    ETOH & Frequency    Drug Use    Chronic Pain Med    GLP-1 Last dose:     Current Medications:    albuterol  (VENTOLIN  HFA, PROAIR  HFA) 90  mcg/actuation inhaler Inhale two puffs by mouth into the lungs four times daily as needed for Wheezing.    aspirin  81 mg chewable tablet Chew one tablet by mouth at bedtime daily.    carbidopa -levodopa  (SINEMET ) 25-100 mg tablet Take 2 tablets in the morning and 1 tablet 4 more times through out the day    cetirizine  (ZYRTEC ) 10 mg tablet Take one tablet by mouth daily.    cholecalciferol (Vitamin D3) (VITAMIN D-3) 1,000 units tablet Take one tablet by mouth twice daily.      clopiDOGreL  (PLAVIX ) 75 mg tablet Take one tablet by mouth daily. Indications: amulet    coenzyme Q10 200 mg capsule Take one capsule by mouth daily.    cyanocobalamin (vitamin B-12) 500 mcg tablet Take one tablet by mouth daily.    cyclobenzaprine  (FLEXERIL ) 10 mg tablet Take one tablet by mouth three times daily as needed for Muscle Cramps.    dextran 70-hypromellose (PF) (ARTIFICIAL TEARS (PF)) 0.1/0.3 % ophthalmic solution Apply one drop to both eyes every 4 hours as needed for Dry Eyes.    ezetimibe  (ZETIA ) 10 mg tablet Take one tablet by mouth at bedtime daily.    famotidine  (PEPCID ) 40 mg tablet Take one tablet by mouth at bedtime daily.    fesoterodine  ER (TOVIAZ ) 4 mg tablet Take one tablet by mouth daily. Do not cut/ crush/ chew fluticasone -umeclidin-vilanter (TRELEGY ELLIPTA) 100-62.5-25 mcg inhaler Inhale 1 Dose by mouth into the lungs daily after dinner.    furosemide  (LASIX ) 40 mg tablet Take 2 tabs on Mon-Wed-Fri  Take 3 tabs on Sun-Tues-Thur-Sat    gabapentin  (NEURONTIN ) 300 mg capsule Take one capsule by mouth three times daily.    glucosamine su 2KCl-chondroit 500-400 mg tab Take 1 Tab by mouth twice daily.    ketoconazole (NIZORAL) 2 % topical shampoo Apply  topically to affected area every 7 days. Apply topically to affected area of damp skin, lather, leave on 5 minutes, and rinse.    losartan  (COZAAR ) 50 mg tablet Take one tablet by mouth at bedtime daily.    metoprolol  succinate XL (TOPROL  XL) 100 mg extended release tablet TAKE 1 TABLET BY MOUTH EVERY DAY    mucus clearing device (AEROBIKA OSCILLATING PEP SYSTM MISC) Use  as directed. Use with salt water  and inhale twice daily    other medication one Dose. Mupirocin dissolved in warm water  and Budesonide in NS  qhs  (sometimes increases to BID)    pantoprazole  DR (PROTONIX ) 40 mg tablet Take one tablet by mouth daily.    polyethylene glycol 3350  (MIRALAX ) 17 g packet Take one packet by mouth daily as needed.    potassium chloride  SR (K-DUR) 20 mEq tablet TAKE 2 TABLETS BY MOUTH MONDAY, WEDNESDAY AND FRIDAY AND TAKE 1 TABLET ALL OTHER DAYS (Patient taking differently: TAKE 1 TABLETS BY MOUTH MONDAY, WEDNESDAY AND FRIDAY AND TAKE 2 TABLETS ALL OTHER DAYS)    predniSONE  (DELTASONE ) 5 mg tablet Take one tablet by mouth daily with breakfast.    pyRIDostigmine  bromide (MESTINON ) 60 mg tablet Take two tablets by mouth three times daily.      roflumilast  (DALIRESP ) 500 mcg tablet Take one tablet by mouth daily.    rosuvastatin  (CRESTOR ) 40 mg tablet Take one tablet by mouth at bedtime daily.    sodium chloride  (HYPER-SAL) 7 % nebulizer solution Inhale 4 mL solution by nebulizer as directed daily.    spironolactone  (ALDACTONE ) 25 mg tablet TAKE 1 TABLET BY MOUTH EVERY DAY WITH FOOD tamsulosin  (FLOMAX )  0.4 mg capsule Take one capsule by mouth daily.    testosterone cypionate 200 mg/mL kit Inject 1 mL into the muscle every 14 days.    traZODone  (DESYREL ) 100 mg tablet Take 1.5 tablets by mouth at bedtime daily.    Vacuum Erection Device System kit Use as directed for sexual activity.       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    Myasthenia gravis (CMS-HCC) 03/16/2022    Overactive bladder (OAB)     Back pain     OSA (obstructive sleep apnea) 12/30/2020    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    Erectile dysfunction, vasculogenic     Beta blocker prescribed for left ventricular systolic dysfunction 07/14/2020    VT (ventricular tachycardia) (CMS-HCC) 02/28/2018    S/P R total knee arthroplasty 12/08/2014    Encounter for anticoagulation discussion and counseling 11/28/2014    Closed fracture of multiple ribs of left side 08/29/2014    Arthritis of left knee 05/13/2014    Left arm swelling 04/01/2014    Hyperlipemia 02/11/2009    Sleep apnea 02/11/2009    Hypertension 02/11/2009    Cerebrovascular disease 02/11/2009    Severe MR treated with valvuloplasty 2006 02/11/2009    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    Atrial Fib Paroxysmal  (HCC) 02/08/2009    Automatic implantable cardioverter-defibrillator in situ 01/06/2006    Medical History[1]   Surgical History:   Procedure Laterality Date    HUMERUS FRACTURE SURGERY Left 2004    MITRAL VALVULOPLASTY  2006    same time as bypass    CORONARY ARTERY BYPASS GRAFT  04/14/2005    CABGx5 LIMA-LAD, L Rad-Dx, sSVG-RCA-RCA, sSVG-OM & MV Plasty for MR:    ICD PLACEMENT  2007    FOOT SURGERY Bilateral 2012    Tarsal tunnel release w/ bunionectomy and hammer toe     EAR SURGERY Bilateral 2012    Ear Tubes    SINUS SURGERY  10/2011    CARDIOVASCULAR STRESS TEST  09/2013    KNEE REPLACEMENT Right 05/2014    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    Performed by Lenon Alm ORN, MD at [facility] OR    TRANSVENOUS REMOVAL IMPLANTABLE DEFIBRILLATOR RIGHT VENTRICULAR LEAD Left 03/13/2018    Performed by Laurian Armin BROCKS, MD at [facility] CVOR    INSERTION RIGHT VENTRICULAR LEAD Left 03/13/2018    Performed by Laurian Armin BROCKS, MD at [facility] CVOR    FLUOROSCOPY - CARDIAC  03/13/2018    Performed by Laurian Armin BROCKS, MD at [facility] CVOR    INSERTION/ REPLACEMENT TEMPORARY PACEMAKER LEAD/ CATHETER  03/13/2018    Performed by Laurian Armin BROCKS, MD at [facility] CVOR    REMOVAL AND REPLACEMENT IMPLANTABLE DEFIBRILLATOR GENERATOR - DUAL LEAD SYSTEM  03/13/2018    Performed by Laurian Armin BROCKS, MD at [facility] CVOR    CARDIOTHORACIC SURGERY STANDBY N/A 03/13/2018    Performed by Cath, Physician at [facility] CVOR    INTRACARDIAC CATHETER ABLATION WITH COMPREHENSIVE ELECTROPHYSIOLOGIC EVALUATION - ATRIAL FIBRILLATION, RADIOFREQUENCY  N/A 11/22/2018    Performed by Laurian Armin BROCKS, MD at [facility] EP LAB    INTRACARDIAC ELECTROPHYSIOLOGIC 3-DIMENSIONAL MAPPING N/A 11/22/2018    Performed by Laurian Armin BROCKS, MD at [facility] EP LAB    INTRACARDIAC ECHOCARDIOGRAPHY N/A 11/22/2018    Performed by Laurian Armin BROCKS, MD at [facility] EP LAB    POSSIBLE INTRAVENOUS DRUG INFUSION FOR STIMULATION AND PACING N/A 11/22/2018    Performed by Laurian Armin BROCKS, MD at [facility] EP LAB    POSSIBLE INSERTION ESOPHAGEAL DEVIATOR N/A 11/22/2018    Performed by Laurian Armin BROCKS, MD at [facility] EP LAB    POSSIBLE EXTERNAL CARDIOVERSION N/A 11/22/2018    Performed by Laurian Armin BROCKS, MD at [facility] EP LAB    INTRACARDIAC CATHETER ABLATION WITH COMPREHENSIVE ELECTROPHYSIOLOGIC EVALUATION - ATYPICAL FLUTTER Right 11/22/2018    Performed by Laurian Armin BROCKS, MD at [facility] EP LAB    TRANSESOPHAGEAL ECHOCARDIOGRAM DURING INTERVENTION N/A 11/22/2018    Performed by Cath, Physician at [facility] EP LAB    INSERTION SPINAL NEUROSTIMULATOR PULSE GENERATOR/ RECEIVER N/A 11/17/2021    Performed by Lazarus Colace, MD at [facility] OR    PERCUTANEOUS IMPLANTATION EPIDURAL NEUROSTIMULATOR ELECTRODE ARRAY x2 N/A 11/17/2021    Performed by Lazarus Colace, MD at [facility] OR    ELECTRONIC ANALYSIS IMPLANTED NEUROSTIMULATOR PULSE GENERATOR SYSTEM - COMPLEX SPINAL CORD/ PERIPHERAL NEUROSTIMULATOR PULSE GENERATOR/ TRANSMITTER WITH INTRA OPERATIVE/ SUBSEQUENT PROGRAMING N/A 11/17/2021    Performed by Lazarus Colace, MD at [facility] OR    SIGMOIDOSCOPY DIAGNOSTIC WITH COLLECTION SPECIMEN BY BRUSHING/ WASHING - FLEXIBLE N/A 09/30/2022    Performed by Ledora Catalina, MD at [facility] ENDO    ESOPHAGOGASTRODUODENOSCOPY WITH SPECIMEN COLLECTION BY BRUSHING/ WASHING N/A 09/30/2022    Performed by Ledora Catalina, MD at [facility] ENDO    EXAM ANORECTAL UNDER ANESTHESIA, HEMMORHOIDECTOMY, EXCISION OF ANAL LESION N/A 02/27/2023    Performed by Rowland Jayson SAUNDERS, MD at [facility] OR    . N/A 02/27/2023    Performed by Rowland Jayson SAUNDERS, MD at [facility] OR    . N/A 02/27/2023    Performed by Rowland Jayson SAUNDERS, MD at [facility] OR    *Nalu PNS* INSERTION/ REPLACEMENT PERIPHERAL/ SACRAL/ GASTRIC NEUROSTIMULATOR PULSE GENERATOR/ RECEIVER/ REQUIRING POCKET CREATION/ CONNECTION ELECTRODE GENERATOR/ RECEIVER Right 07/19/2023    Performed by Lazarus Colace, MD at [facility] OR    PERCUTANEOUS IMPLANTATION NEUROSTIMULATOR ELECTRODE ARRAY - PERIPHERAL NERVE N/A 07/19/2023    Performed by Lazarus Colace, MD at [facility] OR    ELECTRONIC ANALYSIS IMPLANTED NEUROSTIMULATOR PULSE GENERATOR SYSTEM - COMPLEX SPINAL CORD/ PERIPHERAL NEUROSTIMULATOR PULSE GENERATOR/ TRANSMITTER WITH INTRA OPERATIVE/ SUBSEQUENT PROGRAMING N/A 07/19/2023    Performed by Lazarus Colace, MD at [facility] OR    ULTRASOUND GUIDANCE FOR NEEDLE PLACEMENT N/A 07/19/2023    Performed by Lazarus Colace, MD at [facility] OR    ESOPHAGEAL FUNCTION/ GASTROESOPHAGEAL REFLUX TEST WITH NASAL CATHETERD N/A 11/27/2023    Performed by Ronalee Pacific, MD at [facility] ENDO    ESOPHAGOGASTRODUODENOSCOPY WITH BIOPSY - FLEXIBLE N/A 12/07/2023    Performed by Leonce Emerick PARAS, DO at [facility] ENDO    PERCUTANEOUS CLOSURE LEFT ATRIAL APPENDAGE WITH ENDOCARDIAL IMPLANT N/A 07/08/2024    Performed by Jess Wynona GAILS, MD at [facility] EP LAB    TRANSESOPHAGEAL ECHOCARDIOGRAM DURING INTERVENTION N/A 07/08/2024    Performed by Jess Wynona GAILS, MD at [facility] EP LAB    ABLATION OF DYSRHYTHMIC FOCUS      afib    ARTERIOGRAM CAROTID BILAT  2006    BRONCHOSCOPY  probably 2018    done at [facility]    CARDIAC CATHERIZATION  2006    CARDIAC DEFIBRILLATOR PLACEMENT  11/2013, 02-2018    S/P Fidel[employer] ICD Lead Extraction and New Lead Implantation    ECHOCARDIOGRAM PROCEDURE      HX BACK SURGERY  2013, 2006    Lumbart Laminectomy and Fusion    HX CARPAL TUNNEL RELEASE Right 1990s    HX CORONARY ARTERY BYPASS GRAFT  2006    HX ENDOSCOPY  2019    HX HEART CATHETERIZATION      HX TONSILLECTOMY  1954    KNEE SURGERY      REVISION TOTAL KNEE ARTHROPLASTY Right     SURGERY      UMBILICAL ARTERIAL CATH - BEDSIDE      UPPER GASTROINTESTINAL ENDOSCOPY         Allergies: Allergies[2]  Izetta Freund, RN          [1]   Past Medical History:  Diagnosis Date    Back pain     Bronchiectasis (CMS-HCC)     CAD (coronary artery disease) 02/08/2009    Carpal tunnel syndrome     Congestive heart disease (CMS-HCC)     Constipation 2015    COPD (chronic obstructive pulmonary disease) (CMS-HCC)     Degenerative disc disease, lumbar 2010    Dysphagia     Dysphonia     Erectile dysfunction, vasculogenic     Fracture     Fracture, ribs     left    GERD (gastroesophageal reflux disease)     controlled with elevated HOB, protonix  and pepcid      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 now    Heart attack (CMS-HCC)     2006    Hiatal hernia     HX: anticoagulation     Hyperlipemia 02/11/2009    Hypertension 02/11/2009    well controlled with medication 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    Joint pain 1966    Memory loss this year mostly    some    Mitral regurgitation     Myasthenia gravis (CMS-HCC) 2023 - father also had MG    only ocular at this time    Myocardial infarction (CMS-HCC) 2006    NSVT (nonsustained ventricular tachycardia) (CMS-HCC)     OAB (overactive bladder)     Obesity, Class I, 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)     OSA on CPAP 02/11/2009    Osteoarthritis     PAF (paroxysmal atrial fibrillation) (CMS-HCC)     Parkinson disease (CMS-HCC) 2022    Pre-diabetes     Pulmonary emphysema (CMS-HCC)     Pulmonary fibrosis (CMS-HCC)     One doctor said it could be    Pulmonary nodules     S/P ICD (internal cardiac defibrillator) procedure 02/11/2009    Seasonal allergic reaction 1960    Spinal stenosis 2010    Ulcer     Unspecified sinusitis (chronic) 2010    MRSA   [2]   Allergies  Allergen Reactions    Clarithromycin RASH     [place] and burns both ends of GI tract    Oxycodone  MENTAL STATUS CHANGES     Nightmares  Tolerates Tramadol     Ketoconazole SEE COMMENTS     Had bloodshot eyes after use, and sore.  Happens if he gets drug in his eyes.  Tolerates shampoo fine.  Lisinopril COUGH

## 2024-10-01 ENCOUNTER — Encounter: Admit: 2024-10-01 | Discharge: 2024-10-01 | Payer: MEDICARE

## 2024-10-01 DIAGNOSIS — Z95810 Presence of automatic (implantable) cardiac defibrillator: Principal | ICD-10-CM

## 2024-10-01 NOTE — Telephone Encounter [36]
-----   Message from Fawna Cranmer O sent at 10/01/2024  5:40 PM CST -----  Regarding: RE: MRI  3/9 works. Outpatient sched appt notes MRI smartsync, checklist from 11/2022  ----- Message -----  From: Arlis Maus  Sent: 10/01/2024   2:34 PM CST  To: [facility]  Subject: RE: MRI                                          3/9 [id number] CA NEED DC PLEASE AND THANK YOU  ----- Message -----  From: Raigan Baria, BSN  Sent: 09/27/2024   2:31 PM CST  To: Maus Raley-Gordon; [facility]  Subject: RE: MRI                                          Smartsync. Checklist from 11/2022. Date?  ----- Message -----  From: Raley-Gordon, Christina  Sent: 09/27/2024   9:44 AM CST  To: [facility]  Subject: MRI                                              MRN: [id number]     Name:Calton K. Elvan Drape      Exam: MRI L-SPINE WO CONTRAST     Ordering physician: SAMIE DINE G     Expected date or time frame: NEXT AVAILABLE     Paste device information below:          GENERATOR ATRIAL LEAD RV LEAD LV LEAD   Manufacturer: Medtronic            Model Number: Evera MRI XT DR [id number] 4076-52cm Sprint Quattro Secure S MRI [id number]   Serial Number: [id number]   Implant Date: [id number] 01/06/2006 03/13/2018   Lead Location:              Device Type: ICD

## 2024-10-02 ENCOUNTER — Encounter: Admit: 2024-10-02 | Discharge: 2024-10-02 | Payer: MEDICARE

## 2024-10-02 ENCOUNTER — Ambulatory Visit: Admit: 2024-10-02 | Discharge: 2024-10-03 | Payer: MEDICARE

## 2024-10-09 ENCOUNTER — Encounter: Admit: 2024-10-09 | Discharge: 2024-10-09 | Payer: MEDICARE

## 2024-10-10 ENCOUNTER — Encounter: Admit: 2024-10-10 | Discharge: 2024-10-10 | Payer: MEDICARE

## 2024-10-10 NOTE — Progress Notes [1]
 Report Sheet    TEE           Indication: Post Watchman   Ordering Provider: Jess     Name: Johnny [name]     Age: 79 y.o.    DOB: 07/25/1946     MRN: [id number]  Patient phone number: [phone]    Communication Barriers:   None     Lab(s) needed:  Other:     Device check needed:  ICD   Device:   Lab Results   Component Value Date/Time    GENERATOR [employer] 09/28/2024 11:40 AM    EPDEVTYP ICD 09/28/2024 11:40 AM       Other implanted devices/pumps:   Yes   Will bring controller   Has Controller (pt to bring):     Pt Called: Yes and his wife  Arrival Time: 1100 for [facility] clinic with Resa Ruth then TEE to follow   Driver Information: Wife     Date of Last H&P:   10/15/24  Additional Information:     Prior TEE:      Prior DCCV:      Prior Echo:       Previous TEE/DCCV details:     EF:   ECHO EF   Date Value Ref Range Status   08/05/2024 60 % Final       Anticoag:     Dose:     Frequency:     Missed:  Labs   INR    HGB    Platelets    Glucose    NA    K    Creatinine    Other      Medications to HOLD day of:  Vitamins/Supplements  Hold Lasix  and Aldactone      Probe   Gastric Surgery    GERD    Swallow difficulty    Head/Neck Surg/Rad    Chest Surg/Rad    EGD    Varices/Esophag CA    GI bleed    Dental issues      Sedation   COPD    OSA/CPAP    Asthma    Pulm HTN    Anesthesia Issues    Smoker    ETOH & Frequency    Drug Use    Chronic Pain Med    GLP-1 Last dose:     Current Medications:    albuterol  (VENTOLIN  HFA, PROAIR  HFA) 90 mcg/actuation inhaler Inhale two puffs by mouth into the lungs four times daily as needed for Wheezing.    aspirin  81 mg chewable tablet Chew one tablet by mouth at bedtime daily.    carbidopa -levodopa  (SINEMET ) 25-100 mg tablet Take 2 tablets in the morning and 1 tablet 4 more times through out the day    cetirizine  (ZYRTEC ) 10 mg tablet Take one tablet by mouth daily.    cholecalciferol (Vitamin D3) (VITAMIN D-3) 1,000 units tablet Take one tablet by mouth twice daily.      clopiDOGreL  (PLAVIX ) 75 mg tablet Take one tablet by mouth daily. Indications: amulet    coenzyme Q10 200 mg capsule Take one capsule by mouth daily.    cyanocobalamin (vitamin B-12) 500 mcg tablet Take one tablet by mouth daily.    cyclobenzaprine  (FLEXERIL ) 10 mg tablet Take one tablet by mouth three times daily as needed for Muscle Cramps.    dextran 70-hypromellose (PF) (ARTIFICIAL TEARS (PF)) 0.1/0.3 % ophthalmic solution Apply one drop to both eyes every 4 hours as needed for Dry Eyes.  ezetimibe  (ZETIA ) 10 mg tablet Take one tablet by mouth at bedtime daily.    famotidine  (PEPCID ) 40 mg tablet Take one tablet by mouth at bedtime daily.    fesoterodine  ER (TOVIAZ ) 4 mg tablet Take one tablet by mouth daily. Do not cut/ crush/ chew    fluticasone -umeclidin-vilanter (TRELEGY ELLIPTA) 100-62.5-25 mcg inhaler Inhale 1 Dose by mouth into the lungs daily after dinner.    furosemide  (LASIX ) 40 mg tablet Take 2 tabs on Mon-Wed-Fri  Take 3 tabs on Sun-Tues-Thur-Sat    gabapentin  (NEURONTIN ) 300 mg capsule Take one capsule by mouth three times daily.    glucosamine su 2KCl-chondroit 500-400 mg tab Take 1 Tab by mouth twice daily.    ketoconazole (NIZORAL) 2 % topical shampoo Apply  topically to affected area every 7 days. Apply topically to affected area of damp skin, lather, leave on 5 minutes, and rinse.    losartan  (COZAAR ) 50 mg tablet Take one tablet by mouth at bedtime daily.    metoprolol  succinate XL (TOPROL  XL) 100 mg extended release tablet TAKE 1 TABLET BY MOUTH EVERY DAY    mucus clearing device (AEROBIKA OSCILLATING PEP SYSTM MISC) Use  as directed. Use with salt water  and inhale twice daily    other medication one Dose. Mupirocin dissolved in warm water  and Budesonide in NS  qhs  (sometimes increases to BID)    pantoprazole  DR (PROTONIX ) 40 mg tablet Take one tablet by mouth daily.    polyethylene glycol 3350  (MIRALAX ) 17 g packet Take one packet by mouth daily as needed.    potassium chloride  SR (K-DUR) 20 mEq tablet TAKE 2 TABLETS BY MOUTH MONDAY, WEDNESDAY AND FRIDAY AND TAKE 1 TABLET ALL OTHER DAYS (Patient taking differently: TAKE 1 TABLETS BY MOUTH MONDAY, WEDNESDAY AND FRIDAY AND TAKE 2 TABLETS ALL OTHER DAYS)    predniSONE  (DELTASONE ) 5 mg tablet Take one tablet by mouth daily with breakfast.    pregabalin (LYRICA) 75 mg capsule Take one capsule by mouth at bedtime daily for 5 days, THEN one capsule twice daily for 5 days, THEN one capsule three times daily.    pyRIDostigmine  bromide (MESTINON ) 60 mg tablet Take two tablets by mouth three times daily.      roflumilast  (DALIRESP ) 500 mcg tablet Take one tablet by mouth daily.    rosuvastatin  (CRESTOR ) 40 mg tablet Take one tablet by mouth at bedtime daily.    sodium chloride  (HYPER-SAL) 7 % nebulizer solution Inhale 4 mL solution by nebulizer as directed daily.    spironolactone  (ALDACTONE ) 25 mg tablet TAKE 1 TABLET BY MOUTH EVERY DAY WITH FOOD    tamsulosin  (FLOMAX ) 0.4 mg capsule Take one capsule by mouth daily.    testosterone cypionate 200 mg/mL kit Inject 1 mL into the muscle every 14 days.    traZODone  (DESYREL ) 100 mg tablet Take 1.5 tablets by mouth at bedtime daily.    Vacuum Erection Device System kit Use as directed for sexual activity.       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  Myasthenia gravis (CMS-HCC) 03/16/2022    Overactive bladder (OAB)     Back pain     OSA (obstructive sleep apnea) 12/30/2020    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    Erectile dysfunction, vasculogenic     Beta blocker prescribed for left ventricular systolic dysfunction 07/14/2020    VT (ventricular tachycardia) (CMS-HCC) 02/28/2018    S/P R total knee arthroplasty 12/08/2014    Encounter for anticoagulation discussion and counseling 11/28/2014    Closed fracture of multiple ribs of left side 08/29/2014    Arthritis of left knee 05/13/2014    Left arm swelling 04/01/2014    Hyperlipemia 02/11/2009    Sleep apnea 02/11/2009    Hypertension 02/11/2009    Cerebrovascular disease 02/11/2009    Severe MR treated with valvuloplasty 2006 02/11/2009    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    Atrial Fib Paroxysmal  (HCC) 02/08/2009    Automatic implantable cardioverter-defibrillator in situ 01/06/2006    Medical History[1]   Surgical History:   Procedure Laterality Date    HUMERUS FRACTURE SURGERY Left 2004    MITRAL VALVULOPLASTY  2006    same time as bypass    CORONARY ARTERY BYPASS GRAFT  04/14/2005    CABGx5 LIMA-LAD, L Rad-Dx, sSVG-RCA-RCA, sSVG-OM & MV Plasty for MR:    ICD PLACEMENT  2007    FOOT SURGERY Bilateral 2012    Tarsal tunnel release w/ bunionectomy and hammer toe     EAR SURGERY Bilateral 2012    Ear Tubes    SINUS SURGERY  10/2011    CARDIOVASCULAR STRESS TEST  09/2013    KNEE REPLACEMENT Right 05/2014    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    Performed by Lenon Alm ORN, MD at [facility] OR    TRANSVENOUS REMOVAL IMPLANTABLE DEFIBRILLATOR RIGHT VENTRICULAR LEAD Left 03/13/2018    Performed by Laurian Armin BROCKS, MD at [facility] CVOR    INSERTION RIGHT VENTRICULAR LEAD Left 03/13/2018    Performed by Laurian Armin BROCKS, MD at [facility] CVOR    FLUOROSCOPY - CARDIAC  03/13/2018    Performed by Laurian Armin BROCKS, MD at [facility] CVOR    INSERTION/ REPLACEMENT TEMPORARY PACEMAKER LEAD/ CATHETER  03/13/2018    Performed by Laurian Armin BROCKS, MD at [facility] CVOR    REMOVAL AND REPLACEMENT IMPLANTABLE DEFIBRILLATOR GENERATOR - DUAL LEAD SYSTEM  03/13/2018    Performed by Laurian Armin BROCKS, MD at [facility] CVOR    CARDIOTHORACIC SURGERY STANDBY N/A 03/13/2018    Performed by Cath, Physician at [facility] CVOR    INTRACARDIAC CATHETER ABLATION WITH COMPREHENSIVE ELECTROPHYSIOLOGIC EVALUATION - ATRIAL FIBRILLATION, RADIOFREQUENCY N/A 11/22/2018    Performed by Laurian Armin BROCKS, MD at [facility] EP LAB    INTRACARDIAC ELECTROPHYSIOLOGIC 3-DIMENSIONAL MAPPING N/A 11/22/2018    Performed by Laurian Armin BROCKS, MD at [facility] EP LAB    INTRACARDIAC ECHOCARDIOGRAPHY N/A 11/22/2018    Performed by Laurian Armin BROCKS, MD at [facility] EP LAB    POSSIBLE INTRAVENOUS DRUG INFUSION FOR STIMULATION AND PACING N/A 11/22/2018    Performed by Laurian Armin BROCKS, MD at [facility] EP LAB    POSSIBLE INSERTION ESOPHAGEAL DEVIATOR N/A 11/22/2018    Performed by Laurian Armin BROCKS, MD at [facility] EP LAB    POSSIBLE EXTERNAL CARDIOVERSION N/A 11/22/2018    Performed by Laurian Armin BROCKS, MD at [facility] EP LAB    INTRACARDIAC CATHETER ABLATION WITH COMPREHENSIVE ELECTROPHYSIOLOGIC EVALUATION - ATYPICAL FLUTTER Right 11/22/2018    Performed by Laurian Armin BROCKS, MD  at [facility] EP LAB    TRANSESOPHAGEAL ECHOCARDIOGRAM DURING INTERVENTION N/A 11/22/2018    Performed by Cath, Physician at [facility] EP LAB    INSERTION SPINAL NEUROSTIMULATOR PULSE GENERATOR/ RECEIVER N/A 11/17/2021    Performed by Lazarus Colace, MD at [facility] OR    PERCUTANEOUS IMPLANTATION EPIDURAL NEUROSTIMULATOR ELECTRODE ARRAY x2 N/A 11/17/2021    Performed by Lazarus Colace, MD at [facility] OR    ELECTRONIC ANALYSIS IMPLANTED NEUROSTIMULATOR PULSE GENERATOR SYSTEM - COMPLEX SPINAL CORD/ PERIPHERAL NEUROSTIMULATOR PULSE GENERATOR/ TRANSMITTER WITH INTRA OPERATIVE/ SUBSEQUENT PROGRAMING N/A 11/17/2021    Performed by Lazarus Colace, MD at [facility] OR    SIGMOIDOSCOPY DIAGNOSTIC WITH COLLECTION SPECIMEN BY BRUSHING/ WASHING - FLEXIBLE N/A 09/30/2022    Performed by Ledora Catalina, MD at [facility] ENDO    ESOPHAGOGASTRODUODENOSCOPY WITH SPECIMEN COLLECTION BY BRUSHING/ WASHING N/A 09/30/2022    Performed by Ledora Catalina, MD at [facility] ENDO    EXAM ANORECTAL UNDER ANESTHESIA, HEMMORHOIDECTOMY, EXCISION OF ANAL LESION N/A 02/27/2023    Performed by Rowland Jayson SAUNDERS, MD at [facility] OR    . N/A 02/27/2023    Performed by Rowland Jayson SAUNDERS, MD at [facility] OR    . N/A 02/27/2023    Performed by Rowland Jayson SAUNDERS, MD at [facility] OR    *Nalu PNS* INSERTION/ REPLACEMENT PERIPHERAL/ SACRAL/ GASTRIC NEUROSTIMULATOR PULSE GENERATOR/ RECEIVER/ REQUIRING POCKET CREATION/ CONNECTION ELECTRODE GENERATOR/ RECEIVER Right 07/19/2023    Performed by Lazarus Colace, MD at [facility] OR    PERCUTANEOUS IMPLANTATION NEUROSTIMULATOR ELECTRODE ARRAY - PERIPHERAL NERVE N/A 07/19/2023    Performed by Lazarus Colace, MD at [facility] OR    ELECTRONIC ANALYSIS IMPLANTED NEUROSTIMULATOR PULSE GENERATOR SYSTEM - COMPLEX SPINAL CORD/ PERIPHERAL NEUROSTIMULATOR PULSE GENERATOR/ TRANSMITTER WITH INTRA OPERATIVE/ SUBSEQUENT PROGRAMING N/A 07/19/2023    Performed by Lazarus Colace, MD at [facility] OR    ULTRASOUND GUIDANCE FOR NEEDLE PLACEMENT N/A 07/19/2023    Performed by Lazarus Colace, MD at [facility] OR    ESOPHAGEAL FUNCTION/ GASTROESOPHAGEAL REFLUX TEST WITH NASAL CATHETERD N/A 11/27/2023    Performed by Ronalee Pacific, MD at [facility] ENDO    ESOPHAGOGASTRODUODENOSCOPY WITH BIOPSY - FLEXIBLE N/A 12/07/2023    Performed by Leonce Emerick PARAS, DO at [facility] ENDO    PERCUTANEOUS CLOSURE LEFT ATRIAL APPENDAGE WITH ENDOCARDIAL IMPLANT N/A 07/08/2024    Performed by Jess Wynona GAILS, MD at [facility] EP LAB    TRANSESOPHAGEAL ECHOCARDIOGRAM DURING INTERVENTION N/A 07/08/2024    Performed by Jess Wynona GAILS, MD at [facility] EP LAB    ABLATION OF DYSRHYTHMIC FOCUS      afib    ARTERIOGRAM CAROTID BILAT  2006    BRONCHOSCOPY  probably 2018    done at [facility]    CARDIAC CATHERIZATION  2006    CARDIAC DEFIBRILLATOR PLACEMENT  11/2013, 02-2018    S/P [employer]idel[employer] ICD Lead Extraction and New Lead Implantation    ECHOCARDIOGRAM PROCEDURE      HX BACK SURGERY  2013, 2006    Lumbart Laminectomy and Fusion    HX CARPAL TUNNEL RELEASE Right 1990s    HX CORONARY ARTERY BYPASS GRAFT  2006    HX ENDOSCOPY  2019    HX HEART CATHETERIZATION      HX TONSILLECTOMY  1954    KNEE SURGERY REVISION TOTAL KNEE ARTHROPLASTY Right     SURGERY      UMBILICAL ARTERIAL CATH - BEDSIDE      UPPER GASTROINTESTINAL ENDOSCOPY         Allergies: Allergies[2]  Ricka Crews, RN       [1]   Past Medical History:  Diagnosis Date    Back pain     Bronchiectasis (CMS-HCC)     CAD (coronary artery disease) 02/08/2009    Carpal tunnel syndrome     Congestive heart disease (CMS-HCC)     Constipation 2015    COPD (chronic obstructive pulmonary disease) (CMS-HCC)     Degenerative disc disease, lumbar 2010    Dysphagia     Dysphonia     Erectile dysfunction, vasculogenic     Fracture     Fracture, ribs     left    GERD (gastroesophageal reflux disease)     controlled with elevated HOB, protonix  and pepcid      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 now    Heart attack (CMS-HCC)     2006    Hiatal hernia     HX: anticoagulation     Hyperlipemia 02/11/2009    Hypertension 02/11/2009    well controlled with medication    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    Joint pain 1966    Memory loss this year mostly    some    Mitral regurgitation     Myasthenia gravis (CMS-HCC) 2023 - father also had MG    only ocular at this time    Myocardial infarction (CMS-HCC) 2006    NSVT (nonsustained ventricular tachycardia) (CMS-HCC)     OAB (overactive bladder)     Obesity, Class I, 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)     OSA on CPAP 02/11/2009    Osteoarthritis     PAF (paroxysmal atrial fibrillation) (CMS-HCC)     Parkinson disease (CMS-HCC) 2022    Pre-diabetes     Pulmonary emphysema (CMS-HCC)     Pulmonary fibrosis (CMS-HCC)     One doctor said it could be    Pulmonary nodules     S/P ICD (internal cardiac defibrillator) procedure 02/11/2009    Seasonal allergic reaction 1960    Spinal stenosis 2010    Ulcer     Unspecified sinusitis (chronic) 2010    MRSA   [2]   Allergies  Allergen Reactions    Clarithromycin RASH     [place] and burns both ends of GI tract    Oxycodone  MENTAL STATUS CHANGES Nightmares  Tolerates Tramadol     Ketoconazole SEE COMMENTS     Had bloodshot eyes after use, and sore.  Happens if he gets drug in his eyes.  Tolerates shampoo fine.    Lisinopril COUGH

## 2024-10-11 ENCOUNTER — Ambulatory Visit: Admit: 2024-10-11 | Discharge: 2024-10-11 | Payer: MEDICARE

## 2024-10-11 ENCOUNTER — Encounter: Admit: 2024-10-11 | Discharge: 2024-10-11 | Payer: MEDICARE

## 2024-10-11 ENCOUNTER — Ambulatory Visit: Admit: 2024-10-11 | Discharge: 2024-10-12 | Payer: MEDICARE

## 2024-10-11 VITALS — BP 133/65 | HR 82 | Resp 16 | Ht 71.0 in | Wt 210.0 lb

## 2024-10-11 DIAGNOSIS — J438 Other emphysema: Secondary | ICD-10-CM

## 2024-10-11 DIAGNOSIS — J479 Bronchiectasis, uncomplicated: Principal | ICD-10-CM

## 2024-10-11 NOTE — Patient Instructions [37]
 Clinic Visit Summary:     Today we discussed your breathing in pulmonary clinic.  1) continue hypertonic saline nebulizer treatments and percussion vest  2) continue Trelegy daily  3) continue to follow up with ENT team    It was a pleasure to see you today, please don't hesitate to call with any future questions or concerns.      For questions or concerns,  please contact my Clinical Nurse Coordinator at [phone] or send a My Chart message.     Please contact my nurse with any signs and symptoms of worsening productive cough with thick secretions, blood in sputum, chest pain or tightness, shortness of breath, fever, chills, night sweats, or any other pulmonary concerns.     -  For URGENT issues after business hours, weekends, or holidays: Call [phone] and request for the Pulmonary Fellow to be paged.    -  For medication refills, please ask your pharmacy to send an electronic request.  Please allow at least 3 business days for medication refills.      -  For equipment issues, please contact your Durable Medical Equipment (DME) company directly.    -  To cancel, change, or schedule a clinic appointment: Call Pulmonary Scheduling at [phone].

## 2024-10-11 NOTE — Progress Notes [1]
 Pulmonary Clinic Note    Date of Service: 10/11/2024  PCP:  Huntington, Melissa    Subjective      Johnny [name] is a 79 y.o. 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     Patient reports some life stressors recently as his wife has been hospitalized multiple times in the past few months.  Most recently she had a Watchman procedure which was complicated by femoral artery perforation with extensive hemorrhage.  She was later discharged to rehab and has since been home and is improving her mobility.    From a pulmonary standpoint patient reports that he is coughing up less than usual, usually clear in color.  He continues his hypertonic saline nebulizer treatments and percussion vest nightly.  He does report that his sinus discharge has continued and is generally yellow to white in color.  He has noticed increased hoarseness over the past 3 months despite daily sinus rinses.  He was previously seen in ENT with injections of his vocal cords however reports that his voice went away completely after his last injection.  It had been slowly improving until about 3 months ago.  He continues to work with speech pathology.  He reports a round of prednisone  about 2 months ago          ROS  Review of Systems   Constitutional:  Negative for chills, diaphoresis, fever, malaise/fatigue and weight loss.   HENT:  Positive for congestion. Negative for sinus pain.    Respiratory:  Positive for cough and sputum production. Negative for hemoptysis.         Objective:          albuterol  (VENTOLIN  HFA, PROAIR  HFA) 90 mcg/actuation inhaler Inhale two puffs by mouth into the lungs four times daily as needed for Wheezing.    aspirin  81 mg chewable tablet Chew one tablet by mouth at bedtime daily.    carbidopa -levodopa  (SINEMET ) 25-100 mg tablet Take 2 tablets in the morning and 1 tablet 4 more times through out the day    cetirizine  (ZYRTEC ) 10 mg tablet Take one tablet by mouth daily.    cholecalciferol (Vitamin D3) (VITAMIN D-3) 1,000 units tablet Take one tablet by mouth twice daily.      clopiDOGreL  (PLAVIX ) 75 mg tablet Take one tablet by mouth daily. Indications: amulet    coenzyme Q10 200 mg capsule Take one capsule by mouth daily.    cyanocobalamin (vitamin B-12) 500 mcg tablet Take one tablet by mouth daily.    cyclobenzaprine  (FLEXERIL ) 10 mg tablet Take one tablet by mouth three times daily as needed for Muscle Cramps.    dextran 70-hypromellose (PF) (ARTIFICIAL TEARS (PF)) 0.1/0.3 % ophthalmic solution Apply one drop to both eyes every 4 hours as needed for Dry Eyes.    ezetimibe  (ZETIA ) 10 mg tablet Take one tablet by mouth at bedtime daily.    famotidine  (PEPCID ) 40 mg tablet Take one tablet by mouth at bedtime daily.    fesoterodine  ER (TOVIAZ ) 4 mg tablet Take one tablet by mouth daily. Do not cut/ crush/ chew    fluticasone -umeclidin-vilanter (TRELEGY ELLIPTA) 100-62.5-25 mcg inhaler Inhale 1 Dose by mouth into the lungs daily after dinner.    furosemide  (LASIX ) 40 mg tablet Take 2 tabs on Mon-Wed-Fri  Take 3 tabs on Sun-Tues-Thur-Sat    gabapentin  (NEURONTIN ) 300 mg capsule Take one capsule by mouth three times daily. (Patient not taking:  Reported on 10/11/2024)    glucosamine su 2KCl-chondroit 500-400 mg tab Take 1 Tab by mouth twice daily.    ketoconazole (NIZORAL) 2 % topical shampoo Apply  topically to affected area every 7 days. Apply topically to affected area of damp skin, lather, leave on 5 minutes, and rinse.    losartan  (COZAAR ) 50 mg tablet Take one tablet by mouth at bedtime daily.    metoprolol  succinate XL (TOPROL  XL) 100 mg extended release tablet TAKE 1 TABLET BY MOUTH EVERY DAY    mucus clearing device (AEROBIKA OSCILLATING PEP SYSTM MISC) Use  as directed. Use with salt water  and inhale twice daily    other medication one Dose. Mupirocin dissolved in warm water  and Budesonide in NS  qhs  (sometimes increases to BID) pantoprazole  DR (PROTONIX ) 40 mg tablet Take one tablet by mouth daily.    polyethylene glycol 3350  (MIRALAX ) 17 g packet Take one packet by mouth daily as needed.    potassium chloride  SR (K-DUR) 20 mEq tablet TAKE 2 TABLETS BY MOUTH MONDAY, WEDNESDAY AND FRIDAY AND TAKE 1 TABLET ALL OTHER DAYS (Patient taking differently: TAKE 1 TABLETS BY MOUTH MONDAY, WEDNESDAY AND FRIDAY AND TAKE 2 TABLETS ALL OTHER DAYS)    predniSONE  (DELTASONE ) 5 mg tablet Take one tablet by mouth daily with breakfast.    pregabalin (LYRICA) 75 mg capsule Take one capsule by mouth at bedtime daily for 5 days, THEN one capsule twice daily for 5 days, THEN one capsule three times daily.    pyRIDostigmine  bromide (MESTINON ) 60 mg tablet Take two tablets by mouth three times daily.      roflumilast  (DALIRESP ) 500 mcg tablet Take one tablet by mouth daily.    rosuvastatin  (CRESTOR ) 40 mg tablet Take one tablet by mouth at bedtime daily.    sodium chloride  (HYPER-SAL) 7 % nebulizer solution Inhale 4 mL solution by nebulizer as directed daily.    spironolactone  (ALDACTONE ) 25 mg tablet TAKE 1 TABLET BY MOUTH EVERY DAY WITH FOOD    tamsulosin  (FLOMAX ) 0.4 mg capsule Take one capsule by mouth daily.    testosterone cypionate 200 mg/mL kit Inject 1 mL into the muscle every 14 days.    traZODone  (DESYREL ) 100 mg tablet Take 1.5 tablets by mouth at bedtime daily.    Vacuum Erection Device System kit Use as directed for sexual activity.     Vitals:    10/11/24 1507   BP: 133/65   BP Source: Arm, Left Upper   Pulse: 82   Resp: 16   SpO2: 100%   PainSc: Zero   Weight: 95.3 kg (210 lb)   Height: 180.3 cm (5' 11)       Body mass index is 29.29 kg/m?SABRA     Physical Exam  Vitals reviewed.   Constitutional:       General: He is not in acute distress.  HENT:      Head: Normocephalic and atraumatic.      Right Ear: External ear normal.      Left Ear: External ear normal.      Mouth/Throat:      Mouth: Mucous membranes are moist.      Pharynx: Oropharynx is clear. Eyes:      Extraocular Movements: Extraocular movements intact.      Pupils: Pupils are equal, round, and reactive to light.   Cardiovascular:      Rate and Rhythm: Normal rate.      Heart sounds: No murmur heard.  No friction rub. No gallop.   Pulmonary:      Effort: Pulmonary effort is normal.      Breath sounds: No stridor. No wheezing or rales.   Abdominal:      General: Abdomen is flat. Bowel sounds are normal. There is no distension.      Palpations: Abdomen is soft.      Tenderness: There is no abdominal tenderness.   Musculoskeletal:      Cervical back: Normal range of motion and neck supple.      Right lower leg: No edema.      Left lower leg: No edema.   Skin:     General: Skin is warm and dry.      Findings: No rash.   Neurological:      General: No focal deficit present.      Mental Status: He is alert and oriented to person, place, and time.      Cranial Nerves: No cranial nerve deficit (grossly).                  Assessment and Plan:  Bronchiectasis: Clinical diagnosis based off records from [facility].  PFT's 03/06/24 without obstruction but did show mild restrictive defect (TLC 72%).  CXR 03/13/24 without parenchymal infiltrates.  Last CT chest 09/18/2023 does not report fibrotic or bronchiectatic disease, do not have images for my personal verification.  Uses a percussion vest every night along with 7% HTS nebulizer treatments.  He makes his own HTS nebulizer treatments.    - continue 7% HTS nebs with vest therapy nightly   - continue Trelegy 100 daily   - continue 2 puffs albuterol  at night      Restrictive Lung Disease  PFT's 03/06/24 without obstruction but did show mild restrictive defect (TLC 72%), normal diffusion capacity.  No documented history of fibrotic disease on previous cross-sectional imaging of the chest however request images to be pushed into our system for formal confirmation.  Would presume his restriction is related to body habitus, could check MIP/MEP to evaluate for neuromuscular involvement if his breathing is worsening    Multiple Lung Nodules: Last LDCT 09/18/23 with stable subcentimeter pulmonary nodules which have largely been stable since 2019.  There was debate whether a couple of the nodules have increased by about 1 mm since 2019.  Even if these were to represent slow-growing malignant process, the risk of lung biopsy likely outweighs the benefit given their relative stability for the past 5 years.      Recurrent Sinusitis: Reports a history of recurrent sinusitis which will often trigger a chest cold.  Follows with Dr Johnie in [place], uses sinus rinses nightly, dissolves mupiricin ointment in water  along with 1% saline and a capsule of budesonide.    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  Has been seen by Dr. Gunnar at [facility] and currently following with [facility]    Follow up in 6 months or sooner as needed.        Carliss Beal, MD  Pulmonary and Critical Care Medicine

## 2024-10-14 ENCOUNTER — Encounter: Admit: 2024-10-14 | Discharge: 2024-10-14 | Payer: MEDICARE

## 2024-10-15 ENCOUNTER — Encounter: Admit: 2024-10-15 | Discharge: 2024-10-15 | Payer: MEDICARE

## 2024-10-15 ENCOUNTER — Ambulatory Visit: Admit: 2024-10-15 | Discharge: 2024-10-15 | Payer: MEDICARE

## 2024-10-15 ENCOUNTER — Ambulatory Visit: Admit: 2024-10-15 | Discharge: 2024-10-16 | Payer: MEDICARE

## 2024-10-15 VITALS — BP 118/59 | HR 82 | Ht 71.0 in | Wt 210.0 lb

## 2024-10-15 DIAGNOSIS — I472 VT (ventricular tachycardia) (CMS-HCC): Principal | ICD-10-CM

## 2024-10-15 DIAGNOSIS — I255 Ischemic cardiomyopathy: Secondary | ICD-10-CM

## 2024-10-15 DIAGNOSIS — I480 Paroxysmal atrial fibrillation: Secondary | ICD-10-CM

## 2024-10-15 DIAGNOSIS — R0609 Other forms of dyspnea: Secondary | ICD-10-CM

## 2024-10-15 DIAGNOSIS — Z95818 Presence of other cardiac implants and grafts: Secondary | ICD-10-CM

## 2024-10-15 MED ORDER — SODIUM CHLORIDE 0.9 % IV SOLP (OR) 500ML
INTRAVENOUS | 0 refills | Status: DC
Start: 2024-10-15 — End: 2024-10-15

## 2024-10-15 MED ORDER — PROPOFOL 10 MG/ML IV EMUL 20 ML (INFUSION)(AM)(OR)
INTRAVENOUS | 0 refills | Status: DC
Start: 2024-10-15 — End: 2024-10-15

## 2024-10-15 MED ORDER — PROPOFOL INJ 10 MG/ML IV VIAL
INTRAVENOUS | 0 refills | Status: DC
Start: 2024-10-15 — End: 2024-10-15

## 2024-10-15 MED ORDER — LIDOCAINE (PF) 20 MG/ML (2 %) IJ SOLN
INTRAVENOUS | 0 refills | Status: DC
Start: 2024-10-15 — End: 2024-10-15

## 2024-10-15 MED ORDER — PHENYLEPHRINE HCL IN 0.9% NACL 1 MG/10 ML (100 MCG/ML) IV SYRG
INTRAVENOUS | 0 refills | Status: DC
Start: 2024-10-15 — End: 2024-10-15

## 2024-10-15 NOTE — Progress Notes [1]
 Cardiovascular Medicine     Date of Service: 10/15/2024    HPI          I had the pleasure of seeing Johnny [name] at the [facility] today.  Mr Freimark is a 79 year old male here today for a 3 month post transesophageal echocardiogram 3 months post Amulet left atrial occlusion device placement. Scheduled for today.    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 He had vocal cord injections approximately 3 months ago.      He received successful Amulet implant in the [employer] trial 07/08/24. He can discontinue plavix  (could have discontinued at 6 weeks post Amulet) and continue aspirin  81 mg daily.      Today we reviewed the procedure of the TEE and he showed understanding.  He denies issues with his throat or previous issues with short term anesthesia.  His wife is with him today and will drive him home.  He denies new health issues.      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  EF has improved but patient needs to continue HFrEF oriented medical therapy      VT (ventricular tachycardia) (CMS-HCC) 02/28/2018     02/19/2020 - Regadenoson MPI:  Small basal to mid inferolateral wall  fixed perfusion defec with corresponding hypokinesis on gated images suggestive of myocardial injury & small size, mild basal anterolateral wall  predominantly reversible perfusion defect  Corresponding regional wall motion is normal.  All myocardial segments appear viable.EF 46%.  High risk scintigraphic features are absent.  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 7/06 Rx Vytorin 10/40, then to 10/80 approx 04/28/05. Recalls knee pain w/ lovastatin  b. 08/03/05 total 156 trig 118 HDL 39 LDL 95  C. 02/09/09 total 148 trig 114  HDL 43  LDL 81 Vytorin 10/80 Fishoil  >>Starts Lipitor 80 + Zetia  March 2011      Sleep apnea 02/11/2009     Sleep apnea      a. 2/07 Using CPAP, good tolerance and benefit after Sleep study approx 2003 Dr. Naomia      Hypertension 02/11/2009 Hypertension        a. ACE cough on lisinopril, olmesartan 20 well tolerated       Cerebrovascular disease 02/11/2009     Cerebrovascular disease       a. 7/06 Carotid US  mod disease < 50% sten      Severe MR treated with valvuloplasty 2006 02/11/2009     a.  04/14/05 #28 [place] ring, Resect P2 Seg Post leaflet,ring.as above w/ CABG-[place]      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     a. 04/14/05: CABG x 5 LIMA-LAD, L Rad-Dx, sSVG-RCA-RCA, sSVG-OM & MV Plasty for MR  with initial trigger for evaluation being murmur and dyspnea  b. 12/13/05 EF 35% by aden thall, Mixed defect in Cx distribution. Referred for ICD   c. Rash w/ Spiro, switch to Inspra  d. 6/07 Removal of all 8 sternal wires and parasternal lipoma d/t persisting pain  E. Jan, 2011 Regaden Smith. Limited inferior Defect, unchanged from 2007. EF 48%  F. 10/08/13: Reg/Thall Stress: EF 48% No ischemia or change from prior studies.    January 15, 2018  Lexiscan sestamibi study;  sinus rhythm, no arrhythmias or EKG changes    EF was 57% with normal wall motion and normal perfusion. [employer]       Atrial Fib Paroxysmal  (HCC) 02/08/2009     a. 7/06 Early post CAB Afib, Brief amio pre dismissal. Post DC AF still rapid, admit [place]  b. 05/23/05 TEE: No LA clot, EF 25% Cvert to SR on Ticosyn 500 BID, dec'd to 250 BID w/             QTC@ , QTc to 470. Home 05/26/05  c. 11/29/05 Ticosyn DC'd, Dr. Fayette p recurrent afib on ticosyn  D. 12/14 NSR on clinic exam, MAC OV (CBP)  E. 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 Maybell  12/17/13 Generator at ERI,  Fidelis RV-ICD lead extraction w/ laser assistance,Implant Medtronic Evera XT ICD and new RV lead. RA lead left i tact. DFTs done. Dr. Maylene           Review of Systems   Constitutional: Positive for malaise/fatigue.       Physical Exam:  General Appearance: no acute distress  Skin: warm & intact  Neck Veins: neck veins are flat & not distended  Carotid Arteries:  no bruits  Chest Inspection: chest is normal in appearance  Auscultation/Percussion: lungs clear to auscultation, no rales, rhonchi, or wheezing  Cardiac Rhythm: regular rhythm & normal rate  Cardiac Auscultation: Normal S1 & S2.    Murmurs: no cardiac murmurs   Extremities: no lower extremity edema; 2+ symmetric distal pulses  Abdominal Exam: soft, non-tender, bowel sounds normal  Neurologic Exam: oriented to time, place and person; no focal neurologic deficits  Psychiatric: Normal mood and affect.  Behavior is normal. Judgment and thought content normal.        Cardiovascular Studies  Preliminary EKG from today:    Most recent results for 12-Lead ECG   ECG 12-LEAD    Collection Time: 07/23/24  4:15 PM   Result Value Status    VENTRICULAR RATE 99 Final    P-R INTERVAL  Final    QRS DURATION 150 Final    Q-T INTERVAL 374 Final    QTC CALCULATION (BAZETT) 479 Final    P AXIS  Final    R AXIS -24 Final    T AXIS 43 Final    Impression    Atrial fibrillation  Right bundle branch block  Abnormal ECG  When compared with ECG of 09-Jul-2024 03:28,  Atrial fibrillation has replaced Sinus rhythm  Confirmed by Laurian Jacks ([id number]) on 07/23/2024 4:30:50 PM     *Note: Due to a large number of results and/or encounters for the requested time period, some results have not been displayed. A complete set of results can be found in Results Review.        Cardiovascular Health Factors  Vitals BP Readings from Last 3 Encounters:   10/15/24 118/59   10/11/24 133/65   10/02/24 109/65     Wt Readings from Last 3 Encounters:   10/15/24 95.3 kg (210 lb)   10/11/24 95.3 kg (210 lb)   08/05/24 95.6 kg (210 lb 12.8 oz)     BMI Readings from Last 3 Encounters:   10/15/24 29.29 kg/m?   10/11/24 29.29 kg/m?   08/05/24 29.40 kg/m?      Smoking Tobacco Use History[1]   Lipid Profile Cholesterol   Date Value Ref Range Status   07/09/2024 121 <200 mg/dL Final     HDL   Date Value Ref Range Status   07/09/2024 38 (L) >40 mg/dL Final     LDL   Date Value Ref Range Status   07/09/2024 58.00 <100.00 mg/dL Final     Triglycerides   Date Value Ref Range Status   07/09/2024 177 (H) <150 mg/dL Final      Blood Sugar Hemoglobin A1C   Date Value Ref Range Status   09/23/2010 6.2       Glucose   Date Value Ref Range Status   07/09/2024 138 (H) 70 - 100 mg/dL Final   89/86/7974 896 (H) 70 - 100 mg/dL Final   92/86/7977 897 (H) 70 - 100 MG/DL Final     Glucose, POC   Date Value Ref Range Status   07/08/2024 125 (H) 70 - 100 mg/dL Final   93/89/7975 869 (H) 70 - 100 MG/DL Final   93/89/7975 868 (H) 70 - 100 MG/DL Final          Problems Addressed Today  Encounter Diagnoses   Name Primary?    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  I do not see any contraindications with him to proceed.     Heron GORMAN Ruth, APRN-CNS      Total Time Today was 30 minutes in the following activities: Preparing to see the patient, Obtaining and/or reviewing separately obtained history, and Performing a medically appropriate examination and/or evaluation      This note was partially dictated using Dragon Medical One speech recognition software.  Occasional wrong-word or sound-alike substitutions may have occurred due to the inherent limitations of voice-recognition software.  Please read the chart carefully and recognize, using context, where the substitutions may have occurred.  Please do not hesitate to contact me for clarification.            Current Medications (including today's revisions)   albuterol  (VENTOLIN  HFA, PROAIR  HFA) 90 mcg/actuation inhaler Inhale two puffs by mouth into the lungs four times daily as needed for Wheezing.    aspirin  81 mg chewable tablet Chew one tablet by mouth at bedtime daily.    carbidopa -levodopa  (SINEMET ) 25-100 mg tablet Take 2 tablets in the morning and 1 tablet 4 more times through out the day    cetirizine  (ZYRTEC ) 10 mg tablet Take one tablet by mouth daily.    cholecalciferol (Vitamin D3) (VITAMIN D-3) 1,000 units tablet Take one tablet by mouth twice daily.      clopiDOGreL  (PLAVIX ) 75 mg tablet Take one tablet by mouth daily. Indications: amulet    coenzyme Q10 200 mg capsule Take one capsule by mouth daily.    cyanocobalamin (vitamin B-12) 500 mcg tablet Take one tablet by mouth daily.    cyclobenzaprine  (FLEXERIL ) 10 mg tablet Take one tablet by mouth three times daily as needed for Muscle Cramps.    dextran 70-hypromellose (PF) (ARTIFICIAL TEARS (PF)) 0.1/0.3 % ophthalmic solution Apply one drop to both eyes every 4 hours as needed for Dry Eyes.    ezetimibe  (ZETIA ) 10 mg tablet Take one tablet by mouth at bedtime daily.    famotidine  (PEPCID ) 40 mg tablet Take one tablet by mouth at bedtime daily.    fesoterodine  ER (TOVIAZ ) 4 mg tablet Take one tablet by mouth daily. Do not cut/ crush/ chew    fluticasone -umeclidin-vilanter (TRELEGY ELLIPTA) 100-62.5-25 mcg inhaler Inhale 1 Dose by mouth into the lungs daily after dinner.    furosemide  (LASIX ) 40 mg tablet Take 2 tabs on Mon-Wed-Fri  Take 3 tabs on Sun-Tues-Thur-Sat    gabapentin  (NEURONTIN ) 300 mg capsule Take one capsule by mouth three times daily. (Patient not taking: Reported on 10/11/2024)    glucosamine su 2KCl-chondroit 500-400 mg tab Take 1 Tab by mouth twice daily.    ketoconazole (NIZORAL) 2 % topical shampoo Apply  topically to affected area every 7 days. Apply topically to affected area of damp skin, lather, leave on 5 minutes, and rinse.    losartan  (COZAAR ) 50 mg tablet Take one tablet by mouth at bedtime daily. metoprolol  succinate XL (TOPROL  XL) 100 mg extended release tablet TAKE 1 TABLET BY MOUTH EVERY DAY    mucus clearing device (AEROBIKA OSCILLATING PEP SYSTM MISC) Use  as directed. Use with salt water  and inhale twice daily    other medication one Dose. Mupirocin dissolved in warm water  and Budesonide in NS  qhs  (sometimes increases to BID)    pantoprazole   DR (PROTONIX ) 40 mg tablet Take one tablet by mouth daily.    polyethylene glycol 3350  (MIRALAX ) 17 g packet Take one packet by mouth daily as needed.    potassium chloride  SR (K-DUR) 20 mEq tablet TAKE 2 TABLETS BY MOUTH MONDAY, WEDNESDAY AND FRIDAY AND TAKE 1 TABLET ALL OTHER DAYS (Patient taking differently: TAKE 1 TABLETS BY MOUTH MONDAY, WEDNESDAY AND FRIDAY AND TAKE 2 TABLETS ALL OTHER DAYS)    predniSONE  (DELTASONE ) 5 mg tablet Take one tablet by mouth daily with breakfast.    pregabalin (LYRICA) 75 mg capsule Take one capsule by mouth at bedtime daily for 5 days, THEN one capsule twice daily for 5 days, THEN one capsule three times daily.    pyRIDostigmine  bromide (MESTINON ) 60 mg tablet Take two tablets by mouth three times daily.      roflumilast  (DALIRESP ) 500 mcg tablet Take one tablet by mouth daily.    rosuvastatin  (CRESTOR ) 40 mg tablet Take one tablet by mouth at bedtime daily.    sodium chloride  (HYPER-SAL) 7 % nebulizer solution Inhale 4 mL solution by nebulizer as directed daily.    spironolactone  (ALDACTONE ) 25 mg tablet TAKE 1 TABLET BY MOUTH EVERY DAY WITH FOOD    tamsulosin  (FLOMAX ) 0.4 mg capsule Take one capsule by mouth daily.    testosterone cypionate 200 mg/mL kit Inject 1 mL into the muscle every 14 days.    traZODone  (DESYREL ) 100 mg tablet Take 1.5 tablets by mouth at bedtime daily.    Vacuum Erection Device System kit Use as directed for sexual activity.                 [1]   Social History  Tobacco Use   Smoking Status Never   Smokeless Tobacco Never

## 2024-10-15 NOTE — Anesthesia Postprocedure Evaluation [25]
 Post-Anesthesia Evaluation    Name: Johnny [name]      MRN: [id number]     DOB: 04/29/1946     Age: 79 y.o.     Sex: male   __________________________________________________________________________     Procedure Information       Anesthesia Start Date/Time: 10/15/24 1255    Scheduled providers: Omar Leita HERO, MD; Amil Frederick, RN    Procedure: TRANSESOPHAGEAL ECHO    Location: Cardiovascular Medicine: [facility]            Post-Anesthesia Vitals  BP: 110/63 (01/27 1410)  Temp: 35.9 ?C (96.7 ?F) (01/27 1350)  Pulse: 71 (01/27 1410)  Respirations: 21 PER MINUTE (01/27 1410)  SpO2: 99 % (01/27 1410)  O2 Device: None (Room air) (01/27 1410)   Vitals Value Taken Time   BP 110/63 10/15/24 14:10   Temp     Pulse 71 10/15/24 14:10   Respirations 21 PER MINUTE 10/15/24 14:10   SpO2 99 % 10/15/24 14:10   O2 Device None (Room air) 10/15/24 14:10   ABP     ART BP           Post Anesthesia Evaluation Note    Evaluation location: Pre/Post  Patient participation: recovered; patient participated in evaluation  Level of consciousness: alert  Pain management: adequate    Hydration: normovolemia  Temperature: 36.0?C - 38.4?C  Airway patency: adequate    Perioperative Events       Post-op nausea and vomiting: no PONV    Postoperative Status  Cardiovascular status: hemodynamically stable  Respiratory status: spontaneous ventilation  Follow-up needed: none        Perioperative Events  There were no known complications for this encounter.

## 2024-10-15 NOTE — Anesthesia Preprocedure Evaluation [24]
 Anesthesia Pre-Procedure Evaluation    Name: Johnny [name]      MRN: [id number]     DOB: 06-06-1946     Age: 79 y.o.     Sex: male   _________________________________________________________________________     Procedure Info:   Procedure Information       Date/Time: 10/15/24 1300    Scheduled providers: Omar Leita HERO, MD; Amil Frederick, RN    Procedure: TRANSESOPHAGEAL ECHO    Location: Cardiovascular Medicine: [facility]          Physical Assessment  Vital Signs (last filed in past 24 hours):  BP: 118/59 (01/27 1116)  Pulse: 82 (01/27 1116)  SpO2: 98 % (01/27 1116)  O2 Device: CPAP/BiPAP (01/27 1116)  Height: 180.3 cm (5' 11) (01/27 1116)  Weight: 95.3 kg (210 lb) (01/27 1116)     Point of Care Testing  No results for POC Glucose in the last 24 hours  No results for POC Potassium in the last 24 hours    Patient History   Allergies[1]     Home Medications  Current Medications   Medication Directions   albuterol  (VENTOLIN  HFA, PROAIR  HFA) 90 mcg/actuation inhaler Inhale two puffs by mouth into the lungs four times daily as needed for Wheezing.   aspirin  81 mg chewable tablet Chew one tablet by mouth at bedtime daily.   carbidopa -levodopa  (SINEMET ) 25-100 mg tablet Take 2 tablets in the morning and 1 tablet 4 more times through out the day   cetirizine  (ZYRTEC ) 10 mg tablet Take one tablet by mouth daily.   cholecalciferol (Vitamin D3) (VITAMIN D-3) 1,000 units tablet Take one tablet by mouth twice daily.     clopiDOGreL  (PLAVIX ) 75 mg tablet Take one tablet by mouth daily. Indications: amulet   coenzyme Q10 200 mg capsule Take one capsule by mouth daily.   cyanocobalamin (vitamin B-12) 500 mcg tablet Take one tablet by mouth daily.   cyclobenzaprine  (FLEXERIL ) 10 mg tablet Take one tablet by mouth three times daily as needed for Muscle Cramps.   dextran 70-hypromellose (PF) (ARTIFICIAL TEARS (PF)) 0.1/0.3 % ophthalmic solution Apply one drop to both eyes every 4 hours as needed for Dry Eyes. ezetimibe  (ZETIA ) 10 mg tablet Take one tablet by mouth at bedtime daily.   famotidine  (PEPCID ) 40 mg tablet Take one tablet by mouth at bedtime daily.   fesoterodine  ER (TOVIAZ ) 4 mg tablet Take one tablet by mouth daily. Do not cut/ crush/ chew   fluticasone -umeclidin-vilanter (TRELEGY ELLIPTA) 100-62.5-25 mcg inhaler Inhale 1 Dose by mouth into the lungs daily after dinner.   furosemide  (LASIX ) 40 mg tablet Take 2 tabs on Mon-Wed-Fri  Take 3 tabs on Sun-Tues-Thur-Sat   glucosamine su 2KCl-chondroit 500-400 mg tab Take 1 Tab by mouth twice daily.   ketoconazole (NIZORAL) 2 % topical shampoo Apply  topically to affected area every 7 days. Apply topically to affected area of damp skin, lather, leave on 5 minutes, and rinse.   losartan  (COZAAR ) 50 mg tablet Take one tablet by mouth at bedtime daily.   metoprolol  succinate XL (TOPROL  XL) 100 mg extended release tablet TAKE 1 TABLET BY MOUTH EVERY DAY   mucus clearing device (AEROBIKA OSCILLATING PEP SYSTM MISC) Use  as directed. Use with salt water  and inhale twice daily   other medication one Dose. Mupirocin dissolved in warm water  and Budesonide in NS  qhs  (sometimes increases to BID)   pantoprazole  DR (PROTONIX ) 40 mg tablet Take one tablet by  mouth daily.   polyethylene glycol 3350  (MIRALAX ) 17 g packet Take one packet by mouth daily as needed.   potassium chloride  SR (K-DUR) 20 mEq tablet TAKE 2 TABLETS BY MOUTH MONDAY, WEDNESDAY AND FRIDAY AND TAKE 1 TABLET ALL OTHER DAYS  Patient taking differently: TAKE 1 TABLETS BY MOUTH MONDAY, WEDNESDAY AND FRIDAY AND TAKE 2 TABLETS ALL OTHER DAYS   predniSONE  (DELTASONE ) 5 mg tablet Take one tablet by mouth daily with breakfast.   pregabalin (LYRICA) 75 mg capsule Take one capsule by mouth at bedtime daily for 5 days, THEN one capsule twice daily for 5 days, THEN one capsule three times daily.   pyRIDostigmine  bromide (MESTINON ) 60 mg tablet Take two tablets by mouth three times daily.     roflumilast  (DALIRESP ) 500 mcg tablet Take one tablet by mouth daily.   rosuvastatin  (CRESTOR ) 40 mg tablet Take one tablet by mouth at bedtime daily.   sodium chloride  (HYPER-SAL) 7 % nebulizer solution Inhale 4 mL solution by nebulizer as directed daily.   spironolactone  (ALDACTONE ) 25 mg tablet TAKE 1 TABLET BY MOUTH EVERY DAY WITH FOOD   tamsulosin  (FLOMAX ) 0.4 mg capsule Take one capsule by mouth daily.   testosterone cypionate 200 mg/mL kit Inject 1 mL into the muscle every 14 days.   traZODone  (DESYREL ) 100 mg tablet Take 1.5 tablets by mouth at bedtime daily.   Vacuum Erection Device System kit Use as directed for sexual activity.       Inpatient Medications  Facility Administered Medications    No medications found         Review of Systems/Medical History      PONV Risk Score 1  No history of anesthetic complications      PONV Screening: Non-smoker  No family history of anesthetic complications  Cardiovascular       On ASA, plavix       Exercise tolerance: >4 METS      CIED: AICD           Manufacture: [employer]               Device Indication(s): ventricular arrhythmia      Not Pacemaker Dependent           CIED interrogated last 12 months (04/12/2024)           Device settings:  AAIR and DDDR               Battery life > 1 year               Magnet response: Defibrillator disabled      Hypertension, well controlled    Valvular problems/murmurs (mitral valve repair 2006)                                   Past MI (2006), 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 Deward with Pulmonary Disease at [facility] on 03/18/24 (see OVN details below). Pt denies any new pulmonary concerns since that evaluation.       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    Neurological Findings:       Alert and oriented    Normal mental status    Constitutional findings:       No acute distress       Previous Airway Procedure Notes Displaying the 3 most recent records      Date Mask Difficulty Techninque used for succesful ETT placement Blade Type Blade Size View with successful placement Number of attempts    07/08/24 2 - vent by mask + OA or adjuvant +/- NMBA direct laryngoscopy Macintosh 4 grade IIa - partial view of glottis 1            Patient Lines/Drains/Airways Status       Active Lines:       Name Placement date Placement time Site Days    Peripheral IV 08/05/24 1609 Right Anterior;Distal Upper Arm 22 G 08/05/24  1609  -- 71                  Diagnostic Tests  Hematology:   Lab Results   Component Value Date    HGB 12.2 (L) 10/11/2024    HCT 37.3 (L) 10/11/2024    PLTCT 193 10/11/2024    WBC 11.30 (H) 10/11/2024    NEUT 79.6 (H) 10/11/2024    ANC 9.00 (H) 10/11/2024    ALC 1.00 10/11/2024    MONA 10.1 10/11/2024    AMC 1.10 (H) 10/11/2024    EOSA 0.8 10/11/2024    ABC 0.00 10/11/2024    MCV 87.1 10/11/2024    MCH 28.5 10/11/2024    MCHC 32.7 10/11/2024    MPV  7.6 10/11/2024    RDW 16.3 (H) 10/11/2024         General Chemistry:   Lab Results   Component Value Date    NA 140 07/09/2024    K 3.8 07/09/2024    CL 102 07/09/2024    CO2 31 (H) 07/09/2024    GAP 7 07/09/2024    BUN 26 (H) 07/09/2024    CR 1.10 07/09/2024    GLU 138 (H) 07/09/2024    CA 8.9 07/09/2024    ALBUMIN 4.0 07/08/2024    OBSCA 1.08 04/15/2005    MG 2.2 07/01/2024    TOTBILI 0.8 11/22/2018    PO4 3.0 12/14/2009      Coagulation:   Lab Results   Component Value Date    PT 16.5 (H) 07/08/2024    PTT 31.0 08/29/2014    INR 1.3 (L) 07/08/2024       ECHO EF   Date Value Ref Range Status   08/05/2024 60 % Final   07/08/2024 60 % Final   11/22/2018 50 % Final   10/01/2012 50 %        PAC Plan    Anesthesia Plan    ASA score: 3   Plan: MAC  NPO status: acceptable          Informed Consent  Anesthetic plan and risks discussed with patient.  Use of blood products discussed with patient  Blood Consent: consented          Alerts: No  CIED           [1]   Allergies  Allergen Reactions    Clarithromycin RASH     [place]den and burns both ends of GI tract    Oxycodone  MENTAL STATUS CHANGES     Nightmares  Tolerates Tramadol     Ketoconazole SEE COMMENTS     Had bloodshot eyes after use, and sore.  Happens if he gets drug in his eyes.  Tolerates shampoo fine.    Lisinopril COUGH

## 2024-10-16 ENCOUNTER — Encounter: Admit: 2024-10-16 | Discharge: 2024-10-16 | Payer: MEDICARE

## 2024-10-16 NOTE — Telephone Encounter [36]
-----   Message from [place] sent at 07/17/2024 10:16 AM CDT -----  Regarding: YMR post Amulet med mgmt  Please call patient to advise of post Amulet imaging results and medication changes as appropriate.    FYI:  Amulet (Catalyst Study) (Denetta should have instructed the patient to stop Plavix  on 10/16/2023 prior to TEE results)    Thanks  ----- Message -----  From: Nyra Krabbe, BSN  Sent: 10/10/2024  12:00 AM CST  To: Cvm Nurse Ep Team A  Subject: YMR post Amulet med mgmt                         Please call patient to advise of post Amulet imaging results and medication changes as appropriate.    FYI:  Amulet (Catalyst Study) (Denetta should have instructed the patient to stop Plavix  on 10/10/2023 prior to TEE results)    Thanks

## 2024-10-17 ENCOUNTER — Encounter: Admit: 2024-10-17 | Discharge: 2024-10-17 | Payer: MEDICARE

## 2024-10-22 ENCOUNTER — Encounter: Admit: 2024-10-22 | Discharge: 2024-10-22 | Payer: MEDICARE
